# Patient Record
Sex: Female | Born: 1950 | Race: Asian | Hispanic: No | Marital: Married | Smoking: Never smoker
Health system: Southern US, Community
[De-identification: ages and names within clinical notes are randomized; demographics above are authoritative.]

## PROBLEM LIST (undated history)

## (undated) DIAGNOSIS — M069 Rheumatoid arthritis, unspecified: Secondary | ICD-10-CM

## (undated) DIAGNOSIS — K219 Gastro-esophageal reflux disease without esophagitis: Secondary | ICD-10-CM

## (undated) DIAGNOSIS — I10 Essential (primary) hypertension: Secondary | ICD-10-CM

## (undated) DIAGNOSIS — I639 Cerebral infarction, unspecified: Secondary | ICD-10-CM

## (undated) DIAGNOSIS — E785 Hyperlipidemia, unspecified: Secondary | ICD-10-CM

## (undated) DIAGNOSIS — N644 Mastodynia: Secondary | ICD-10-CM

## (undated) DIAGNOSIS — D649 Anemia, unspecified: Secondary | ICD-10-CM

## (undated) DIAGNOSIS — E119 Type 2 diabetes mellitus without complications: Secondary | ICD-10-CM

## (undated) DIAGNOSIS — G039 Meningitis, unspecified: Secondary | ICD-10-CM

## (undated) DIAGNOSIS — N189 Chronic kidney disease, unspecified: Secondary | ICD-10-CM

## (undated) DIAGNOSIS — M5416 Radiculopathy, lumbar region: Secondary | ICD-10-CM

## (undated) HISTORY — DX: Chronic kidney disease, unspecified: N18.9

## (undated) HISTORY — DX: Meningitis, unspecified: G03.9

## (undated) HISTORY — PX: CHOLECYSTECTOMY: SHX55

## (undated) HISTORY — DX: Gastro-esophageal reflux disease without esophagitis: K21.9

## (undated) HISTORY — DX: Radiculopathy, lumbar region: M54.16

## (undated) HISTORY — DX: Mastodynia: N64.4

## (undated) HISTORY — PX: APPENDECTOMY: SHX54

## (undated) HISTORY — DX: Cerebral infarction, unspecified: I63.9

## (undated) HISTORY — PX: MIDDLE EAR SURGERY: SHX713

## (undated) HISTORY — DX: Hyperlipidemia, unspecified: E78.5

## (undated) HISTORY — DX: Anemia, unspecified: D64.9

---

## 2004-06-24 ENCOUNTER — Emergency Department: Payer: Self-pay | Admitting: Emergency Medicine

## 2005-01-21 ENCOUNTER — Inpatient Hospital Stay: Payer: Self-pay | Admitting: Internal Medicine

## 2005-02-13 ENCOUNTER — Emergency Department: Payer: Self-pay | Admitting: Emergency Medicine

## 2005-06-07 ENCOUNTER — Ambulatory Visit: Payer: Self-pay | Admitting: Internal Medicine

## 2005-11-08 ENCOUNTER — Ambulatory Visit: Payer: Self-pay | Admitting: Gastroenterology

## 2006-05-12 ENCOUNTER — Ambulatory Visit: Payer: Self-pay | Admitting: Internal Medicine

## 2006-10-03 ENCOUNTER — Ambulatory Visit: Payer: Self-pay | Admitting: Internal Medicine

## 2006-10-08 ENCOUNTER — Emergency Department: Payer: Self-pay | Admitting: Emergency Medicine

## 2006-10-08 ENCOUNTER — Other Ambulatory Visit: Payer: Self-pay

## 2007-05-15 ENCOUNTER — Other Ambulatory Visit: Payer: Self-pay

## 2007-05-15 ENCOUNTER — Emergency Department: Payer: Self-pay | Admitting: Emergency Medicine

## 2007-05-27 ENCOUNTER — Other Ambulatory Visit: Payer: Self-pay

## 2007-05-27 ENCOUNTER — Emergency Department: Payer: Self-pay | Admitting: Emergency Medicine

## 2007-09-22 ENCOUNTER — Ambulatory Visit: Payer: Self-pay | Admitting: Internal Medicine

## 2007-12-01 ENCOUNTER — Ambulatory Visit: Payer: Self-pay | Admitting: Urology

## 2008-01-23 ENCOUNTER — Ambulatory Visit: Payer: Self-pay | Admitting: Internal Medicine

## 2008-09-23 ENCOUNTER — Ambulatory Visit: Payer: Self-pay | Admitting: *Deleted

## 2008-12-09 ENCOUNTER — Encounter: Payer: Self-pay | Admitting: Rheumatology

## 2008-12-11 ENCOUNTER — Encounter: Payer: Self-pay | Admitting: Rheumatology

## 2009-01-10 ENCOUNTER — Encounter: Payer: Self-pay | Admitting: Rheumatology

## 2009-02-10 ENCOUNTER — Encounter: Payer: Self-pay | Admitting: Rheumatology

## 2009-02-14 ENCOUNTER — Ambulatory Visit: Payer: Self-pay | Admitting: Internal Medicine

## 2009-12-12 ENCOUNTER — Encounter: Payer: Self-pay | Admitting: Rheumatology

## 2010-01-10 ENCOUNTER — Encounter: Payer: Self-pay | Admitting: Rheumatology

## 2010-10-13 ENCOUNTER — Ambulatory Visit: Payer: Self-pay | Admitting: Internal Medicine

## 2011-08-23 ENCOUNTER — Emergency Department: Payer: Self-pay | Admitting: Emergency Medicine

## 2011-08-23 LAB — COMPREHENSIVE METABOLIC PANEL
BUN: 16 mg/dL (ref 7–18)
Bilirubin,Total: 0.4 mg/dL (ref 0.2–1.0)
Co2: 26 mmol/L (ref 21–32)
EGFR (Non-African Amer.): >60
Glucose: 221 mg/dL — ABNORMAL HIGH (ref 65–99)
Osmolality: 282 (ref 275–301)
Potassium: 4.1 mmol/L (ref 3.5–5.1)
SGPT (ALT): 21 U/L
Sodium: 137 mmol/L (ref 136–145)

## 2011-08-23 LAB — PROTIME-INR: INR: 0.8

## 2011-08-23 LAB — CBC
HCT: 36.7 % (ref 35.0–47.0)
HGB: 12.4 g/dL (ref 12.0–16.0)
MCH: 29.3 pg (ref 26.0–34.0)
Platelet: 177 10*3/uL (ref 150–440)
RBC: 4.23 10*6/uL (ref 3.80–5.20)
RDW: 12.9 % (ref 11.5–14.5)
WBC: 6.1 10*3/uL (ref 3.6–11.0)

## 2011-11-02 ENCOUNTER — Ambulatory Visit: Payer: Self-pay | Admitting: Internal Medicine

## 2012-10-10 ENCOUNTER — Emergency Department: Payer: Self-pay | Admitting: Emergency Medicine

## 2012-10-10 LAB — COMPREHENSIVE METABOLIC PANEL
Albumin: 3.8 g/dL (ref 3.4–5.0)
Alkaline Phosphatase: 101 U/L (ref 50–136)
Anion Gap: 5 — ABNORMAL LOW (ref 7–16)
BUN: 17 mg/dL (ref 7–18)
Calcium, Total: 9.5 mg/dL (ref 8.5–10.1)
Chloride: 102 mmol/L (ref 98–107)
Co2: 29 mmol/L (ref 21–32)
Creatinine: 0.93 mg/dL (ref 0.60–1.30)
EGFR (African American): >60
Glucose: 151 mg/dL — ABNORMAL HIGH (ref 65–99)
Osmolality: 276 (ref 275–301)
Potassium: 4 mmol/L (ref 3.5–5.1)
SGOT(AST): 21 U/L (ref 15–37)
Total Protein: 9 g/dL — ABNORMAL HIGH (ref 6.4–8.2)

## 2012-10-10 LAB — CBC
HGB: 13.9 g/dL (ref 12.0–16.0)
MCH: 28.2 pg (ref 26.0–34.0)
MCHC: 33.3 g/dL (ref 32.0–36.0)
RDW: 13.6 % (ref 11.5–14.5)
WBC: 6.6 10*3/uL (ref 3.6–11.0)

## 2012-10-10 LAB — CK TOTAL AND CKMB (NOT AT ARMC): CK-MB: <0.5 ng/mL — ABNORMAL LOW (ref 0.5–3.6)

## 2012-10-10 LAB — TROPONIN I: Troponin-I: <0.02 ng/mL

## 2012-12-27 ENCOUNTER — Ambulatory Visit: Payer: Self-pay | Admitting: Internal Medicine

## 2013-09-25 ENCOUNTER — Ambulatory Visit: Payer: Self-pay | Admitting: Rheumatology

## 2013-10-16 ENCOUNTER — Ambulatory Visit: Payer: Self-pay | Admitting: Rheumatology

## 2013-12-06 DIAGNOSIS — M5416 Radiculopathy, lumbar region: Secondary | ICD-10-CM

## 2013-12-06 DIAGNOSIS — M48062 Spinal stenosis, lumbar region with neurogenic claudication: Secondary | ICD-10-CM | POA: Insufficient documentation

## 2013-12-06 HISTORY — DX: Radiculopathy, lumbar region: M54.16

## 2014-12-17 ENCOUNTER — Encounter: Payer: Self-pay | Admitting: Emergency Medicine

## 2014-12-17 ENCOUNTER — Inpatient Hospital Stay
Admission: EM | Admit: 2014-12-17 | Discharge: 2014-12-19 | DRG: 065 | Disposition: A | Discharge status: Home / Self Care | Payer: Medicaid Other | Attending: Internal Medicine | Admitting: Internal Medicine

## 2014-12-17 ENCOUNTER — Inpatient Hospital Stay
Admit: 2014-12-17 | Discharge: 2014-12-17 | Disposition: A | Discharge status: Home / Self Care | Payer: Medicaid Other | Attending: Internal Medicine | Admitting: Internal Medicine

## 2014-12-17 ENCOUNTER — Emergency Department: Payer: Medicaid Other

## 2014-12-17 DIAGNOSIS — Z7982 Long term (current) use of aspirin: Secondary | ICD-10-CM | POA: Diagnosis not present

## 2014-12-17 DIAGNOSIS — Z79899 Other long term (current) drug therapy: Secondary | ICD-10-CM | POA: Diagnosis not present

## 2014-12-17 DIAGNOSIS — I639 Cerebral infarction, unspecified: Secondary | ICD-10-CM | POA: Diagnosis present

## 2014-12-17 DIAGNOSIS — Z9889 Other specified postprocedural states: Secondary | ICD-10-CM | POA: Diagnosis not present

## 2014-12-17 DIAGNOSIS — M069 Rheumatoid arthritis, unspecified: Secondary | ICD-10-CM | POA: Diagnosis present

## 2014-12-17 DIAGNOSIS — I69354 Hemiplegia and hemiparesis following cerebral infarction affecting left non-dominant side: Secondary | ICD-10-CM | POA: Diagnosis not present

## 2014-12-17 DIAGNOSIS — I10 Essential (primary) hypertension: Secondary | ICD-10-CM | POA: Diagnosis present

## 2014-12-17 DIAGNOSIS — E119 Type 2 diabetes mellitus without complications: Secondary | ICD-10-CM | POA: Diagnosis present

## 2014-12-17 DIAGNOSIS — Z789 Other specified health status: Secondary | ICD-10-CM

## 2014-12-17 DIAGNOSIS — R202 Paresthesia of skin: Secondary | ICD-10-CM

## 2014-12-17 HISTORY — DX: Type 2 diabetes mellitus without complications: E11.9

## 2014-12-17 HISTORY — DX: Cerebral infarction, unspecified: I63.9

## 2014-12-17 HISTORY — DX: Rheumatoid arthritis, unspecified: M06.9

## 2014-12-17 HISTORY — DX: Essential (primary) hypertension: I10

## 2014-12-17 LAB — PROTIME-INR
INR: 0.86
Prothrombin Time: 11.9 seconds (ref 11.4–15.0)

## 2014-12-17 LAB — DIFFERENTIAL
BASOS ABS: 0 10*3/uL (ref 0–0.1)
Basophils Relative: 1 %
EOS ABS: 0.1 10*3/uL (ref 0–0.7)
Eosinophils Relative: 2 %
LYMPHS ABS: 1.8 10*3/uL (ref 1.0–3.6)
LYMPHS PCT: 22 %
MONOS PCT: 6 %
Monocytes Absolute: 0.5 10*3/uL (ref 0.2–0.9)
NEUTROS ABS: 5.6 10*3/uL (ref 1.4–6.5)
NEUTROS PCT: 69 %

## 2014-12-17 LAB — COMPREHENSIVE METABOLIC PANEL
ALBUMIN: 4 g/dL (ref 3.5–5.0)
ALK PHOS: 104 U/L (ref 38–126)
ALT: 30 U/L (ref 14–54)
AST: 29 U/L (ref 15–41)
Anion gap: 12 (ref 5–15)
BILIRUBIN TOTAL: 0.8 mg/dL (ref 0.3–1.2)
BUN: 17 mg/dL (ref 6–20)
CO2: 22 mmol/L (ref 22–32)
CREATININE: 1 mg/dL (ref 0.44–1.00)
Calcium: 9.5 mg/dL (ref 8.9–10.3)
Chloride: 99 mmol/L — ABNORMAL LOW (ref 101–111)
GFR calc Af Amer: >60 mL/min (ref >60)
GFR, EST NON AFRICAN AMERICAN: 58 mL/min — AB (ref >60)
GLUCOSE: 189 mg/dL — AB (ref 65–99)
POTASSIUM: 3.8 mmol/L (ref 3.5–5.1)
Sodium: 133 mmol/L — ABNORMAL LOW (ref 135–145)
TOTAL PROTEIN: 8.9 g/dL — AB (ref 6.5–8.1)

## 2014-12-17 LAB — CBC
HEMATOCRIT: 40.8 % (ref 35.0–47.0)
HEMOGLOBIN: 13.5 g/dL (ref 12.0–16.0)
MCH: 28.2 pg (ref 26.0–34.0)
MCHC: 33.2 g/dL (ref 32.0–36.0)
MCV: 85.2 fL (ref 80.0–100.0)
Platelets: 274 10*3/uL (ref 150–440)
RBC: 4.79 MIL/uL (ref 3.80–5.20)
RDW: 13.5 % (ref 11.5–14.5)
WBC: 8 10*3/uL (ref 3.6–11.0)

## 2014-12-17 LAB — TROPONIN I

## 2014-12-17 LAB — APTT: APTT: 30 s (ref 24–36)

## 2014-12-17 MED ORDER — ACETAMINOPHEN 325 MG PO TABS: 650.0000 mg | ORAL_TABLET | Freq: Four times a day (QID) | ORAL | Status: DC | PRN

## 2014-12-17 MED ORDER — SODIUM CHLORIDE 0.9 % IJ SOLN
3.0000 mL | Freq: Two times a day (BID) | INTRAMUSCULAR | Status: DC
  Administered 2014-12-17: 22:00:00 3 mL via INTRAVENOUS

## 2014-12-17 MED ORDER — METOPROLOL TARTRATE 50 MG PO TABS
50.0000 mg | ORAL_TABLET | Freq: Two times a day (BID) | ORAL | Status: DC
  Filled 2014-12-17: qty 1

## 2014-12-17 MED ORDER — ASPIRIN EC 325 MG PO TBEC
325.0000 mg | DELAYED_RELEASE_TABLET | Freq: Every day | ORAL | Status: DC
  Administered 2014-12-19: 325 mg via ORAL
  Filled 2014-12-17: qty 1

## 2014-12-17 MED ORDER — METOPROLOL TARTRATE 1 MG/ML IV SOLN
5.0000 mg | INTRAVENOUS | Status: DC | PRN
  Administered 2014-12-17: 19:00:00 5 mg via INTRAVENOUS
  Filled 2014-12-17 (×2): qty 5

## 2014-12-17 MED ORDER — SODIUM CHLORIDE 0.9 % IJ SOLN
3.0000 mL | Freq: Two times a day (BID) | INTRAMUSCULAR | Status: DC
  Administered 2014-12-17: 21:00:00 3 mL via INTRAVENOUS

## 2014-12-17 MED ORDER — SODIUM CHLORIDE 0.9 % IV SOLN: 250.0000 mL | INTRAVENOUS | Status: DC | PRN

## 2014-12-17 MED ORDER — METOPROLOL TARTRATE 1 MG/ML IV SOLN
5.0000 mg | INTRAVENOUS | Status: DC | PRN
  Filled 2014-12-17: qty 5

## 2014-12-17 MED ORDER — ACETAMINOPHEN 650 MG RE SUPP: 650.0000 mg | Freq: Four times a day (QID) | RECTAL | Status: DC | PRN

## 2014-12-17 MED ORDER — SIMVASTATIN 10 MG PO TABS
10.0000 mg | ORAL_TABLET | Freq: Every day | ORAL | Status: DC
  Filled 2014-12-17 (×2): qty 1

## 2014-12-17 MED ORDER — ENOXAPARIN SODIUM 40 MG/0.4ML ~~LOC~~ SOLN
40.0000 mg | SUBCUTANEOUS | Status: DC
  Administered 2014-12-17 – 2014-12-18 (×2): 40 mg via SUBCUTANEOUS
  Filled 2014-12-17 (×2): qty 0.4

## 2014-12-17 MED ORDER — SODIUM CHLORIDE 0.9 % IJ SOLN
3.0000 mL | INTRAMUSCULAR | Status: DC | PRN
Start: 2014-12-17 — End: 2014-12-18

## 2014-12-17 NOTE — H&P (Signed)
[facility] [place] NAME: Kelli Gutierrez    MR#:  [id number]  DATE OF BIRTH:  [birth year]  DATE OF ADMISSION:  12/17/2014  PRIMARY CARE PHYSICIAN: No primary care provider on file.   REQUESTING/REFERRING PHYSICIAN: Dr. Thomasene Lot  CHIEF COMPLAINT:  Right hand tingling and numbness  HISTORY OF PRESENT ILLNESS:  Kelli Gutierrez  is a 64 y.o. 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 Denies any headache or blurry vision. Denies any dysarthria. Patient reports that her symptoms were started at 6 PM yesterday and the right upper extremity weakness is getting worse with pronator drift. CT head is negative. Hospitalist team is called to admit the patient. The patient's blood pressure is very high with systolic being at [id number]  PAST MEDICAL HISTORY:   Past Medical History  Diagnosis Date  . Diabetes mellitus without complication   . Hypertension   . Rheumatoid arthritis   . Stroke     PAST SURGICAL HISTOIRY:   Past Surgical History  Procedure Laterality Date  . Middle ear surgery      SOCIAL HISTORY:   Social History  Substance Use Topics  . Smoking status: Never Smoker   . Smokeless tobacco: Not on file  . Alcohol Use: No    FAMILY HISTORY:  History reviewed. No pertinent family history.  DRUG ALLERGIES:  No Known Allergies  REVIEW OF SYSTEMS:  CONSTITUTIONAL: No fever, fatigue or weakness.  EYES: No blurred or double vision.  EARS, NOSE, AND THROAT: No tinnitus or ear pain.  RESPIRATORY: No cough, shortness of breath, wheezing or hemoptysis.  CARDIOVASCULAR: No chest pain, orthopnea, edema.  GASTROINTESTINAL: No nausea, vomiting,  diarrhea or abdominal pain.  GENITOURINARY: No dysuria, hematuria.  ENDOCRINE: No polyuria, nocturia,  HEMATOLOGY: No anemia, easy bruising or bleeding SKIN: No rash or lesion. MUSCULOSKELETAL: No joint pain or arthritis.  Chronic left residual weakness. Currently reporting right upper extremity tingling and numbness NEUROLOGIC: No tingling, numbness, weakness.  PSYCHIATRY: No anxiety or depression.   MEDICATIONS AT HOME:   Prior to Admission medications   Medication Sig Start Date End Date Taking? Authorizing Provider  aspirin EC 81 MG tablet Take 81 mg by mouth every evening.    Yes Historical Provider, MD  glipiZIDE-metformin (METAGLIP) 5-500 MG per tablet Take 1 tablet by mouth 2 (two) times daily.   Yes Historical Provider, MD  metoprolol tartrate (LOPRESSOR) 25 MG tablet Take 25 mg by mouth daily.   Yes Historical Provider, MD  Tofacitinib Citrate (XELJANZ) 5 MG TABS Take 5 mg by mouth 2 (two) times daily.   Yes Historical Provider, MD      VITAL SIGNS:  Blood pressure 242/74, pulse 79, temperature 98.2 F (36.8 C), temperature source Oral, resp. rate 20, SpO2 98 %.  PHYSICAL EXAMINATION:  GENERAL:  64 y.o.-year-old patient lying in the bed with no acute distress.  EYES: Pupils equal, round, reactive to light and accommodation. No scleral icterus. Extraocular muscles intact.  HEENT: Head atraumatic, normocephalic. Oropharynx and nasopharynx clear.  NECK:  Supple, no jugular venous distention. No thyroid enlargement, no tenderness.  LUNGS: Normal breath sounds bilaterally, no wheezing, rales,rhonchi or crepitation. No use of  accessory muscles of respiration.  CARDIOVASCULAR: S1, S2 normal. No murmurs, rubs, or gallops.  ABDOMEN: Soft, nontender, nondistended. Bowel sounds present. No organomegaly or mass.  EXTREMITIES: No pedal edema, cyanosis, or clubbing.  NEUROLOGIC: Cranial nerves II through XII are intact. Muscle strength 4/5 in left upper extremity and left lower extremity  which is chronic .right upper extremity motor is 4 out of 5 and decreased touch sensation .right lower extremity arteries 5 out of 5 and sensory is intact .reflexes are 2+. And left mouth is deviated to the right nasolabial fold is obliterated on the left . Speech is slow but patient has reported that she is at her baseline Gait not checked.  PSYCHIATRIC: The patient is alert and oriented x 3.  SKIN: No obvious rash, lesion, or ulcer.   LABORATORY PANEL:   CBC  Recent Labs Lab 12/17/14 1300  WBC 8.0  HGB 13.5  HCT 40.8  PLT 274   ------------------------------------------------------------------------------------------------------------------  Chemistries   Recent Labs Lab 12/17/14 1300  NA 133*  K 3.8  CL 99*  CO2 22  GLUCOSE 189*  BUN 17  CREATININE 1.00  CALCIUM 9.5  AST 29  ALT 30  ALKPHOS 104  BILITOT 0.8   ------------------------------------------------------------------------------------------------------------------  Cardiac Enzymes  Recent Labs Lab 12/17/14 1300  TROPONINI <0.03   ------------------------------------------------------------------------------------------------------------------  RADIOLOGY:  Ct Head Wo Contrast  12/17/2014   CLINICAL DATA:  Right side numbness since 12/16/2014. Initial encounter.  EXAM: CT HEAD WITHOUT CONTRAST  TECHNIQUE: Contiguous axial images were obtained from the base of the skull through the vertex without intravenous contrast.  COMPARISON:  Head CT scan 08/23/2011  FINDINGS: Chronic microvascular ischemic change and a remote right basal ganglia lacunar infarction are again seen. There may be a remote left basal ganglia lacunar infarction versus a dilated perivascular space, unchanged. No evidence of acute abnormality including hemorrhage, infarct, mass lesion, mass effect, midline shift or abnormal extra-axial fluid collection is identified. There is no hydrocephalus or pneumocephalus. The calvarium is intact. Imaged  paranasal sinuses and mastoid air cells are clear.  IMPRESSION: No acute abnormality.  Stable compared to prior exam.   Electronically Signed   By: Inge Rise M.D.   On: 12/17/2014 13:38    EKG:   Orders placed or performed during the hospital encounter of 12/17/14  . ED EKG  . ED EKG  . EKG 12-Lead  . EKG 12-Lead    IMPRESSION AND PLAN:   64 year old female with past medical history of acute CVA in year 1999, chronic left-sided residual weakness is presenting to the ED with a chief complaint of right upper extremity tingling and numbness started from 6 PM yesterday.  1. 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 Leotis Shames Patient's aspirin dose is increased to 325 mg Check fasting lipid panel and hemoglobin A1c Patient needs tight control of her risk factors including hypertension and diabetes mellitus PT consult We will get carotid Dopplers, echocardiogram and MRI of the brain Will get bedside swallow evaluation and speech therapy eval if needed  2. Malignant hypertension  We will allow permissive hypertension in view of problem #1 Patient will be receiving Lopressor 5 mg IV if systolic blood pressure is greater than 170 Increase metoprolol dose to 50 mg by mouth twice a day and titrate as needed basis as patient's systolic blood pressure is  at 240  3. Diabetes mellitus Hold oral medications Check hemoglobin A1c Will place the patient on sliding scale insulin as currently she is on by mouth  4. Old history of stroke with chronic residual left-sided weakness Patient is not a candidate for TPA as she had intracranial bleed in the past  5. Chronic history of rheumatoid arthritis Will resume her home medication if she passes bedside swallow evaluation Pain medications as needed basis  6. Will provide GI  prophylaxis and DVT prophylaxis       All the records are reviewed and case discussed with ED provider. Management plans discussed with the patient, family and they are in agreement.  CODE STATUS: Full code, daughter is her medical power of attorney  TOTAL TIME TAKING CARE OF THIS PATIENT: 45  minutes.  More than 50% of the time was spent on coordination of care   Nicholes Mango M.D on 12/17/2014 at 5:28 PM  Between 7am to 6pm - Pager - [phone]  After 6pm go to [email or web address] - password [email or web address] [facility]  [facility] [facility]  Office  [phone]  CC: Primary care physician; No primary care provider on file.

## 2014-12-17 NOTE — ED Notes (Signed)
Pt states yesterday, her right arm and leg began tingling, however intermittent, it began again this morning, however, worsening right now. Pt has facial droop noted, however, she states this is from her previous stroke in 56. Speech is clear.

## 2014-12-17 NOTE — Plan of Care (Signed)
Problem: Discharge/Transitional Outcomes Goal: Educational Plan Complete Outcome: Progressing Handout given to pt

## 2014-12-17 NOTE — ED Notes (Signed)
Pt reports right sided weakness and "tingling" beginning yesterday. Pt reports right arm and hand feeling weak. Pt reports having a stroke in the past (1999) that left pt with left sided weakness and facial droop. Pt has elevated BP but denies headache, dizziness, or changes in vision other than normal diabetic related visual changes. Pt is in no acute distress at this time.

## 2014-12-17 NOTE — ED Provider Notes (Signed)
[facility] Emergency Department Provider Note  ____________________________________________  Time seen: [id number]  I have reviewed the triage vital signs and the nursing notes.   HISTORY  Chief Complaint Stroke Symptoms     HPI Kelli Gutierrez is a 64 y.o. female with a history of prior CVA, which includes the use of TPA with subsequent intracranial hemorrhage, presents with a complaint of increased numbness tingling in the right extremities. She reports that this began yesterday. She denies any change in motor function. She does have noted facial asymmetry, which she reports is not new.  She denies any chest pain, shortness of breath, nausea, or vomiting.   Past Medical History  Diagnosis Date  . Diabetes mellitus without complication   . Hypertension   . Rheumatoid arthritis   . Stroke     There are no active problems to display for this patient.   Past Surgical History  Procedure Laterality Date  . Middle ear surgery      Current Outpatient Rx  Name  Route  Sig  Dispense  Refill  . aspirin EC 81 MG tablet   Oral   Take 81 mg by mouth every evening.          Marland Kitchen glipiZIDE-metformin (METAGLIP) 5-500 MG per tablet   Oral   Take 1 tablet by mouth 2 (two) times daily.         . metoprolol tartrate (LOPRESSOR) 25 MG tablet   Oral   Take 25 mg by mouth daily.         . Tofacitinib Citrate (XELJANZ) 5 MG TABS   Oral   Take 5 mg by mouth 2 (two) times daily.           Allergies Review of patient's allergies indicates no known allergies.  No family history on file.  Social History Social History  Substance Use Topics  . Smoking status: Never Smoker   . Smokeless tobacco: None  . Alcohol Use: No    Review of Systems  Constitutional: Negative for fever. ENT: Negative for sore throat. Cardiovascular: Negative for chest pain. Respiratory: Negative for shortness of breath. Gastrointestinal: Negative for abdominal pain, vomiting  and diarrhea. Genitourinary: Negative for dysuria. Musculoskeletal: No myalgias or injuries. Skin: Negative for rash. Neurological: Positive for numbness tingling paresthesia in the right extremities. No other acute changes reported   10-point ROS otherwise negative.  ____________________________________________   PHYSICAL EXAM:  VITAL SIGNS: ED Triage Vitals  Enc Vitals Group     BP 12/17/14 1252 232/92 mmHg     Pulse Rate 12/17/14 1252 93     Resp 12/17/14 1252 18     Temp 12/17/14 1252 97.6 F (36.4 C)     Temp Source 12/17/14 1252 Oral     SpO2 12/17/14 1252 95 %     Weight --      Height --      Head Cir --      Peak Flow --      Pain Score --      Pain Loc --      Pain Edu? --      Excl. in [place]? --     Constitutional: Alert and oriented. Pleasant and in no distress. ENT   Head: Normocephalic and atraumatic.   Nose: No congestion/rhinnorhea.   Mouth/Throat: Mucous membranes are moist. Cardiovascular: Normal rate, regular rhythm, no murmur noted Respiratory:  Normal respiratory effort, no tachypnea.    Breath sounds are clear and equal bilaterally.  Gastrointestinal: Soft and nontender. No distention.  Back: No muscle spasm, no tenderness, no CVA tenderness. Musculoskeletal: No deformity noted. Nontender with normal range of motion in all extremities.  No noted edema. Neurologic:  Normal speech and language. Decreased sensation in the right lower extremity. Pronator drift shows mild mild drift of the right arm. Patient has noted facial asymmetry, which she reports is not new. The remainder of the exam is normal.  Skin:  Skin is warm, dry. No rash noted. Psychiatric: Mood and affect are normal. Speech and behavior are normal.  ____________________________________________    LABS (pertinent positives/negatives)  Labs Reviewed  COMPREHENSIVE METABOLIC PANEL - Abnormal; Notable for the following:    Sodium 133 (*)    Chloride 99 (*)    Glucose, Bld 189  (*)    Total Protein 8.9 (*)    GFR calc non Af Amer 58 (*)    All other components within normal limits  PROTIME-INR  APTT  CBC  DIFFERENTIAL  TROPONIN I     ____________________________________________   EKG  ED ECG REPORT I, Kawanda Drumheller W, the attending physician, personally viewed and interpreted this ECG.   Date: 12/17/2014  EKG Time: 1341  Rate: 80  Rhythm: Normal sinus rhythm  Axis: Normal  Intervals: Normal  ST&T Change: None noted   ____________________________________________    RADIOLOGY  CT scan head: IMPRESSION: No acute abnormality. Stable compared to prior exam.  ___________________________________________  CRITICAL CARE Performed by: Ahmed Prima   Total critical care time: 30 minutes due to the acute nature of this patient's neurologic condition and discussions with both neurology and internal medicine.  Critical care time was exclusive of separately billable procedures and treating other patients.  Critical care was necessary to treat or prevent imminent or life-threatening deterioration.  Critical care was time spent personally by me on the following activities: development of treatment plan with patient and/or surrogate as well as nursing, discussions with consultants, evaluation of patient's response to treatment, examination of patient, obtaining history from patient or surrogate, ordering and performing treatments and interventions, ordering and review of laboratory studies, ordering and review of radiographic studies, pulse oximetry and re-evaluation of patient's condition.  ____________________________________________   INITIAL IMPRESSION / ASSESSMENT AND PLAN / ED COURSE  Pertinent labs & imaging results that were available during my care of the patient were reviewed by me and considered in my medical decision making (see chart for details).  64 year old female with prior CVA, now presents with new onset paresthesia and  tingling in the right arm and right leg. It is difficult to determine what symptoms are old versus new for this patient. I am concerned that she has a very mild drift in her right arm upon assessing pronator drift. Head CT is negative for a hemorrhage. Given the fact that the symptoms started yesterday as well as the fact that she had had a bleed on a prior use of TPA, she would not be a candidate for TPA anyway.  I will call neurology and seek their further input on this patient's care.   ----------------------------------------- 3:10 PM on 12/17/2014 -----------------------------------------  After discussing the case with Dr. Irish Elders, I have spoken with Dr. Margaretmary Eddy to have the patient evaluated for initial hospital.  ____________________________________________   FINAL CLINICAL IMPRESSION(S) / ED DIAGNOSES  Final diagnoses:  Paresthesia of right arm and leg  Cerebral infarction due to unspecified mechanism      Ahmed Prima, MD 12/17/14 1511

## 2014-12-17 NOTE — ED Notes (Signed)
MD at bedside with pt

## 2014-12-17 NOTE — ED Notes (Signed)
Pt was given tpa on prior cva event, had a brain bleed from administration.

## 2014-12-17 NOTE — ED Notes (Signed)
Patient transported to CT 

## 2014-12-17 NOTE — ED Notes (Signed)
When checking on pt pt verbalized increased heaviness in right arm and tingling in right hand. When tested for drift, RN noted drift has increased in right arm but not in right leg. MD notified. Pt in no acute distress at this time.

## 2014-12-17 NOTE — ED Notes (Signed)
Resumed care from shannon rn.  Pt alert.   Pt has numbness in right arm.  Family with pt.  nsr on monitor.  Skin warm and dry.  Iv in place.

## 2014-12-17 NOTE — Plan of Care (Signed)
Problem: Consults Goal: Ischemic Stroke Patient Education See Patient Education Module for education specifics. Outcome: Progressing NIH of 6, pt has had a stroke in the past, most recent was 1999. Right sided weakness noted, pt complains of tingling to right side Swallow study to be done by speech, pt failed the nursing stroke swallowing screen

## 2014-12-17 NOTE — Progress Notes (Signed)
*  PRELIMINARY RESULTS* Echocardiogram 2D Echocardiogram has been performed.  [place] 12/17/2014, 6:21 PM

## 2014-12-18 ENCOUNTER — Inpatient Hospital Stay: Payer: Medicaid Other

## 2014-12-18 DIAGNOSIS — I639 Cerebral infarction, unspecified: Principal | ICD-10-CM

## 2014-12-18 LAB — LIPID PANEL
CHOL/HDL RATIO: 6.1 ratio
Cholesterol: 267 mg/dL — ABNORMAL HIGH (ref 0–200)
HDL: 44 mg/dL (ref >40)
LDL CALC: 168 mg/dL — AB (ref 0–99)
Triglycerides: 273 mg/dL — ABNORMAL HIGH (ref <150)
VLDL: 55 mg/dL — ABNORMAL HIGH (ref 0–40)

## 2014-12-18 LAB — CBC
HEMATOCRIT: 39.3 % (ref 35.0–47.0)
HEMOGLOBIN: 13.1 g/dL (ref 12.0–16.0)
MCH: 28.9 pg (ref 26.0–34.0)
MCHC: 33.4 g/dL (ref 32.0–36.0)
MCV: 86.4 fL (ref 80.0–100.0)
Platelets: 251 10*3/uL (ref 150–440)
RBC: 4.54 MIL/uL (ref 3.80–5.20)
RDW: 14 % (ref 11.5–14.5)
WBC: 6.5 10*3/uL (ref 3.6–11.0)

## 2014-12-18 LAB — COMPREHENSIVE METABOLIC PANEL
ALK PHOS: 90 U/L (ref 38–126)
ALT: 25 U/L (ref 14–54)
AST: 18 U/L (ref 15–41)
Albumin: 3.6 g/dL (ref 3.5–5.0)
Anion gap: 10 (ref 5–15)
BUN: 17 mg/dL (ref 6–20)
CALCIUM: 9.3 mg/dL (ref 8.9–10.3)
CO2: 26 mmol/L (ref 22–32)
CREATININE: 0.92 mg/dL (ref 0.44–1.00)
Chloride: 102 mmol/L (ref 101–111)
Glucose, Bld: 190 mg/dL — ABNORMAL HIGH (ref 65–99)
Potassium: 3.7 mmol/L (ref 3.5–5.1)
Sodium: 138 mmol/L (ref 135–145)
Total Bilirubin: 0.8 mg/dL (ref 0.3–1.2)
Total Protein: 7.9 g/dL (ref 6.5–8.1)

## 2014-12-18 LAB — PROTIME-INR
INR: 0.93
PROTHROMBIN TIME: 12.7 s (ref 11.4–15.0)

## 2014-12-18 LAB — GLUCOSE, CAPILLARY
GLUCOSE-CAPILLARY: 203 mg/dL — AB (ref 65–99)
GLUCOSE-CAPILLARY: 193 mg/dL — AB (ref 65–99)

## 2014-12-18 LAB — HEMOGLOBIN A1C: Hgb A1c MFr Bld: 8.4 % — ABNORMAL HIGH (ref 4.0–6.0)

## 2014-12-18 MED ORDER — ATORVASTATIN CALCIUM 20 MG PO TABS
80.0000 mg | ORAL_TABLET | Freq: Every day | ORAL | Status: DC
  Administered 2014-12-18: 20:00:00 80 mg via ORAL
  Filled 2014-12-18 (×2): qty 4

## 2014-12-18 MED ORDER — GADOBENATE DIMEGLUMINE 529 MG/ML IV SOLN
15.0000 mL | Freq: Once | INTRAVENOUS | Status: AC | PRN
  Administered 2014-12-18: 10:00:00 14 mL via INTRAVENOUS

## 2014-12-18 MED ORDER — METFORMIN HCL 500 MG PO TABS: 500.0000 mg | ORAL_TABLET | Freq: Two times a day (BID) | ORAL | Status: DC

## 2014-12-18 MED ORDER — GLIPIZIDE 5 MG PO TABS
5.0000 mg | ORAL_TABLET | Freq: Two times a day (BID) | ORAL | Status: DC
  Administered 2014-12-18 – 2014-12-19 (×2): 5 mg via ORAL
  Filled 2014-12-18 (×2): qty 1

## 2014-12-18 MED ORDER — IOHEXOL 300 MG/ML  SOLN
75.0000 mL | Freq: Once | INTRAMUSCULAR | Status: AC | PRN
  Administered 2014-12-18: 75 mL via INTRAVENOUS

## 2014-12-18 MED ORDER — METOPROLOL TARTRATE 50 MG PO TABS
50.0000 mg | ORAL_TABLET | Freq: Two times a day (BID) | ORAL | Status: DC
  Administered 2014-12-18 – 2014-12-19 (×2): 50 mg via ORAL
  Filled 2014-12-18 (×2): qty 1

## 2014-12-18 MED ORDER — INSULIN ASPART 100 UNIT/ML ~~LOC~~ SOLN: 0.0000 [IU] | Freq: Every day | SUBCUTANEOUS | Status: DC

## 2014-12-18 MED ORDER — INSULIN ASPART 100 UNIT/ML ~~LOC~~ SOLN
0.0000 [IU] | Freq: Three times a day (TID) | SUBCUTANEOUS | Status: DC
  Administered 2014-12-19: 2 [IU] via SUBCUTANEOUS
  Administered 2014-12-19: 3 [IU] via SUBCUTANEOUS
  Filled 2014-12-18: qty 3
  Filled 2014-12-18: qty 2

## 2014-12-18 NOTE — Progress Notes (Signed)
NIH scale has increased from 6 to a 10. Aphasia and right arm weakness has worsened. Pt complaining of severe tingling to face. Dr. Irish Elders, notified ordered a stat CT to rule out Hemorraghic stroke  and MRI notified that pt will need MRI stat after CT.

## 2014-12-18 NOTE — Progress Notes (Addendum)
Inpatient Diabetes Program Recommendations  AACE/ADA: New Consensus Statement on Inpatient Glycemic Control (2013)  Target Ranges:  Prepandial:   less than 140 mg/dL      Peak postprandial:   less than 180 mg/dL (1-2 hours)      Critically ill patients:  140 - 180 mg/dL   Results for SHEREDA, BUTLAND (MRN [id number]) as of 12/18/2014 08:54  Ref. Range 12/17/2014 13:00 12/18/2014 04:37  Glucose Latest Ref Range: 65-99 mg/dL 189 (H) 190 (H)    Diabetes history: DM2 Outpatient Diabetes medications: Metaglip 5-500 mg BID Current orders for Inpatient glycemic control: None  Inpatient Diabetes Program Recommendations Correction (SSI): Please consider ordering Novolog sensitive correction ACHS using Glycemic Control Order Set. HgbA1C: A1C is in process.   12/18/14@17 :45-Talked with Dr. Anselm Jungling regarding inpatient glycemic control. Order given to resume outpatient DM medications and to order CBGs with Novolog sensitive correction.  Thanks, Barnie Alderman, RN, MSN, CCRN, CDE Diabetes Coordinator Inpatient Diabetes Program [phone] (Team Pager from [place] to [place]) [phone] (AP office) [phone] [facility] office) [phone] [facility] office)

## 2014-12-18 NOTE — Progress Notes (Signed)
PT Cancellation Note  Patient Details Name: Kelli Gutierrez MRN: [id number] DOB: 16-Nov-1950   Cancelled Treatment:    Reason Eval/Treat Not Completed: Patient at procedure or test/unavailable;Other (comment) (has multiple tests to be conducted).  Will try later if time allows.   Ramond Dial 12/18/2014, 10:16 AM   Mee Hives, PT MS Acute Rehab Dept. Number: [facility] [id number] and [place] [phone]

## 2014-12-18 NOTE — Plan of Care (Signed)
Problem: Spiritual Needs Goal: Ability to function at adequate level Outcome: Progressing Plan of care Progress of care: Patient alert and oriented, admitted with stroke, has right side weakness, slurred speech. Patient complains of numbness to right side of face , b/p elevated Mobility: Patient ambulates to bathroom with one assist.  Diet: Npo except for icechips Patient awaiting MRI test.  Patient with telemetry intact sr 71

## 2014-12-18 NOTE — Evaluation (Signed)
Clinical/Bedside Swallow Evaluation Patient Details  Name: ANTIANA VAUSE MRN: [id number] Date of Birth: [birth year]  Today's Date: 12/18/2014 Time: SLP Start Time (ACUTE ONLY): 7 SLP Stop Time (ACUTE ONLY): 1230 SLP Time Calculation (min) (ACUTE ONLY): 60 min  Past Medical History:  Past Medical History  Diagnosis Date  . Diabetes mellitus without complication   . Hypertension   . Rheumatoid arthritis   . Stroke    Past Surgical History:  Past Surgical History  Procedure Laterality Date  . Middle ear surgery     HPI:  Pt is a 64 y.o. 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 Pt is presenting to the ED with a chief complaint of right upper extremity tingling, numbness and weakness. Denies any headache or blurry vision. Denies any new dysarthria. Patient reports that her symptoms were started at 6 PM yesterday and the right upper extremity weakness is getting worse with pronator drift. Pt has been eating a regular diet and denied any difficulty swallowing w/ foods/liquids. Speech fairly clear and intelligible; pt denied any new decline in articulation. Pt is currently NPO.    Assessment / Plan / Recommendation Clinical Impression  Pt appeared to present w/ adequate oropharyngeal phase swallow function w/ no overt s/s of aspiration noted w/ trials assessed. Pt has baseline Left labial weakness but new R facial/labial numbness and tingling. Oral phase appeared adequate for bolus management and transfer for swallowing. No overt s/s of aspiration noted. Pt fed self but required moderate assistance d/t old Left UE weakness and new R UE weakness; OT consult requested. Pt's speech and language abilities appeared wfl during conversation. Rec. a mech soft diet w/ thin liquids w/ aspiration precautions; meds w/ liquids as tolerates. ST will f/u. Pt  agreed.      Aspiration Risk   (reduced)    Diet Recommendation Dysphagia 3 (Mech soft);Thin   Medication Administration: Whole meds with liquid Compensations: Slow rate;Small sips/bites    Other  Recommendations Recommended Consults:  (OT and PT) Oral Care Recommendations: Oral care BID;Staff/trained caregiver to provide oral care   Follow Up Recommendations       Frequency and Duration min 3x week  1 week   Pertinent Vitals/Pain denied    SLP Swallow Goals  see care plan   Swallow Study Prior Functional Status   lived at home caring for Son. Fairly independent w/ ADLs but w/ LUE weakness d/t old CVA. Ate a regular diet.     General Date of Onset: 12/17/14 Other Pertinent Information: Pt is a 64 y.o. 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 Pt is presenting to the ED with a chief complaint of right upper extremity tingling, numbness and weakness. Denies any headache or blurry vision. Denies any new dysarthria. Patient reports that her symptoms were started at 6 PM yesterday and the right upper extremity weakness is getting worse with pronator drift. Pt has been eating a regular diet and denied any difficulty swallowing w/ foods/liquids. Speech fairly clear and intelligible; pt denied any new decline in articulation. Pt is currently NPO.  Type of Study: Bedside swallow evaluation Previous Swallow Assessment: none noted Diet Prior to this Study:  Regular;Thin liquids (at home) Temperature Spikes Noted: No (wbc wnl) Respiratory Status: Room air History of Recent Intubation: No Behavior/Cognition: Alert;Cooperative;Pleasant mood Oral Cavity - Dentition: Adequate natural dentition/normal for age Self-Feeding Abilities: Needs assist;Needs set up (moderate+) Patient Positioning: Upright in bed Baseline Vocal Quality:  Normal Volitional Cough: Strong Volitional Swallow: Able to elicit    Oral/Motor/Sensory Function Overall Oral Motor/Sensory Function: Impaired Labial ROM: Reduced left (baseline; numbness on R) Labial Symmetry: Abnormal symmetry left (baseline) Labial Strength: Reduced (Left; baseline) Labial Sensation: Reduced Lingual ROM: Within Functional Limits Lingual Symmetry: Within Functional Limits Lingual Strength: Within Functional Limits Facial Sensation: Reduced (on Right) Velum: Within Functional Limits Mandible: Within Functional Limits   Ice Chips Ice chips: Within functional limits Presentation: Spoon (fed; 3 trials)   Thin Liquid Thin Liquid: Within functional limits Presentation: Cup;Self Fed;Straw (~4 ozs)    Nectar Thick Nectar Thick Liquid: Not tested   Honey Thick Honey Thick Liquid: Not tested   Puree Puree: Within functional limits Presentation: Spoon;Self Fed (assisted; 4 ozs) Other Comments: slight amount of bolus residue in Left corner of mouth intermittently - baseline for pt per her report   Solid   GO    Solid: Within functional limits Presentation: Spoon (fed; 3 trials)      Orinda Kenner, MS, CCC-SLP  Ingris Pasquarella 12/18/2014,2:45 PM

## 2014-12-18 NOTE — Consult Note (Signed)
CC: L sided weakness   HPI: Kelli Gutierrez is an 64 y.o. 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 On admission SBP was 240.    Past Medical History  Diagnosis Date  . Diabetes mellitus without complication   . Hypertension   . Rheumatoid arthritis   . Stroke     Past Surgical History  Procedure Laterality Date  . Middle ear surgery      History reviewed. No pertinent family history.  Social History:  reports that she has never smoked. She does not have any smokeless tobacco history on file. She reports that she does not drink alcohol. Her drug history is not on file.  No Known Allergies  Medications: I have reviewed the patient's current medications.  ROS: History obtained from the patient  General ROS: negative for - chills, fatigue, fever, night sweats, weight gain or weight loss Psychological ROS: negative for - behavioral disorder, hallucinations, memory difficulties, mood swings or suicidal ideation Ophthalmic ROS: negative for - blurry vision, double vision, eye pain or loss of vision ENT ROS: negative for - epistaxis, nasal discharge, oral lesions, sore throat, tinnitus or vertigo Allergy and Immunology ROS: negative for - hives or itchy/watery eyes Hematological and Lymphatic ROS: negative for - bleeding problems, bruising or swollen lymph nodes Endocrine ROS: negative for - galactorrhea, hair pattern changes, polydipsia/polyuria or temperature intolerance Respiratory ROS: negative for - cough, hemoptysis, shortness of breath or wheezing Cardiovascular ROS: negative for - chest pain, dyspnea on exertion, edema or irregular heartbeat Gastrointestinal ROS: negative for  - abdominal pain, diarrhea, hematemesis, nausea/vomiting or stool incontinence Genito-Urinary ROS: negative for - dysuria, hematuria, incontinence or urinary frequency/urgency Musculoskeletal ROS: negative for - joint swelling or muscular weakness Neurological ROS: as noted in HPI Dermatological ROS: negative for rash and skin lesion changes  Physical Examination: Blood pressure 180/70, pulse 78, temperature 98.6 F (37 C), temperature source Oral, resp. rate 17, height 5\' 3"  (1.6 m), weight 71.668 kg (158 lb), SpO2 94 %.  HEENT-  Normocephalic, no lesions, without obvious abnormality.  Normal external eye and conjunctiva.  Normal TM's bilaterally.  Normal auditory canals and external ears. Normal external nose, mucus membranes and septum.  Normal pharynx.  Neurological Examination Mental Status: Alert, oriented, thought content appropriate.  Speech fluent without evidence of aphasia.  Able to follow 3 step commands without difficulty. Cranial Nerves: II: Discs flat bilaterally; Visual fields grossly normal, pupils equal, round, reactive to light and accommodation III,IV, VI: ptosis not present, extra-ocular motions intact bilaterally V,VII: smile symmetric, facial light touch sensation normal bilaterally VIII: hearing normal bilaterally IX,X: gag reflex present XI: bilateral shoulder shrug XII: midline tongue extension Motor: Right : Upper extremity   4/5    Left:     Upper extremity   4/5  Lower extremity   4/5     Lower extremity   5/5 Tone and bulk:normal tone throughout; no atrophy noted Sensory: Pinprick and light touch intact throughout, bilaterally Deep Tendon Reflexes: 1+ and symmetric throughout Plantars: Right: downgoing   Left: downgoing Cerebellar: normal finger-to-nose, normal rapid alternating movements and normal heel-to-shin test Gait: not tested      Laboratory Studies:   Basic  Metabolic Panel:  Recent Labs Lab 12/17/14 1300 12/18/14 0437  NA 133* 138   K 3.8 3.7  CL 99* 102  CO2 22 26  GLUCOSE 189* 190*  BUN 17 17  CREATININE 1.00 0.92  CALCIUM 9.5 9.3    Liver Function Tests:  Recent Labs Lab 12/17/14 1300 12/18/14 0437  AST 29 18  ALT 30 25  ALKPHOS 104 90  BILITOT 0.8 0.8  PROT 8.9* 7.9  ALBUMIN 4.0 3.6   No results for input(s): LIPASE, AMYLASE in the last 168 hours. No results for input(s): AMMONIA in the last 168 hours.  CBC:  Recent Labs Lab 12/17/14 1300 12/18/14 0437  WBC 8.0 6.5  NEUTROABS 5.6  --   HGB 13.5 13.1  HCT 40.8 39.3  MCV 85.2 86.4  PLT 274 251    Cardiac Enzymes:  Recent Labs Lab 12/17/14 1300  TROPONINI <0.03    BNP: Invalid input(s): POCBNP  CBG: No results for input(s): GLUCAP in the last 168 hours.  Microbiology: No results found for this or any previous visit.  Coagulation Studies:  Recent Labs  12/17/14 1300 12/18/14 0437  LABPROT 11.9 12.7  INR 0.86 0.93    Urinalysis: No results for input(s): COLORURINE, LABSPEC, PHURINE, GLUCOSEU, HGBUR, BILIRUBINUR, KETONESUR, PROTEINUR, UROBILINOGEN, NITRITE, LEUKOCYTESUR in the last 168 hours.  Invalid input(s): APPERANCEUR  Lipid Panel:     Component Value Date/Time   CHOL 267* 12/18/2014 0437   TRIG 273* 12/18/2014 0437   HDL 44 12/18/2014 0437   CHOLHDL 6.1 12/18/2014 0437   VLDL 55* 12/18/2014 0437   LDLCALC 168* 12/18/2014 0437    HgbA1C: No results found for: HGBA1C  Urine Drug Screen:  No results found for: LABOPIA, COCAINSCRNUR, LABBENZ, AMPHETMU, THCU, LABBARB  Alcohol Level: No results for input(s): ETH in the last 168 hours.  Other results: EKG: normal EKG, normal sinus rhythm, unchanged from previous tracings.  Imaging: Ct Head Wo Contrast  12/17/2014   CLINICAL DATA:  Right side numbness since 12/16/2014. Initial encounter.  EXAM: CT HEAD WITHOUT CONTRAST  TECHNIQUE: Contiguous axial images were obtained from the base of the skull through the vertex without intravenous contrast.  COMPARISON:   Head CT scan 08/23/2011  FINDINGS: Chronic microvascular ischemic change and a remote right basal ganglia lacunar infarction are again seen. There may be a remote left basal ganglia lacunar infarction versus a dilated perivascular space, unchanged. No evidence of acute abnormality including hemorrhage, infarct, mass lesion, mass effect, midline shift or abnormal extra-axial fluid collection is identified. There is no hydrocephalus or pneumocephalus. The calvarium is intact. Imaged paranasal sinuses and mastoid air cells are clear.  IMPRESSION: No acute abnormality.  Stable compared to prior exam.   Electronically Signed   By: Inge Rise M.D.   On: 12/17/2014 13:38     Assessment/Plan: Kelli Gutierrez is an 64 y.o. 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 On admission SBP was 240.   No acute abnormalities on CTH. MRI L thalamic infarct.  Pt has a lot of giveaway weakness. Likely will improve Increase ASA to full dose of 325 Pt/ot D/c planning  12/18/2014, 7:57 AM

## 2014-12-18 NOTE — Care Management (Signed)
Admitted to [facility] with the diagnosis of CVA. Daughter is Germain Osgood [phone]). Son lives with her in the home. Sees Dr. Brynda Greathouse. Doesn't remember when she seen Dr. Brynda Greathouse last.  Attempted to contact daughter for more information. Left voice mail. Spearman RN MSN Care management [phone]

## 2014-12-18 NOTE — Progress Notes (Signed)
[place] [facility] [place] NAME: Kelli Gutierrez    MR#:  [id number]  DATE OF BIRTH:  28-Mar-1951  SUBJECTIVE:  CHIEF COMPLAINT:   Chief Complaint  Patient presents with  . Stroke Symptoms  right side weakness is still present.  REVIEW OF SYSTEMS:  CONSTITUTIONAL: No fever, fatigue or weakness.  EYES: No blurred or double vision.  EARS, NOSE, AND THROAT: No tinnitus or ear pain.  RESPIRATORY: No cough, shortness of breath, wheezing or hemoptysis.  CARDIOVASCULAR: No chest pain, orthopnea, edema.  GASTROINTESTINAL: No nausea, vomiting, diarrhea or abdominal pain.  GENITOURINARY: No dysuria, hematuria.  ENDOCRINE: No polyuria, nocturia,  HEMATOLOGY: No anemia, easy bruising or bleeding SKIN: No rash or lesion. MUSCULOSKELETAL: No joint pain or arthritis.   NEUROLOGIC: No tingling, numbness, left side old weakness, but new weakness on right side. PSYCHIATRY: No anxiety or depression.   ROS  DRUG ALLERGIES:  No Known Allergies  VITALS:  Blood pressure 149/75, pulse 89, temperature 97.8 F (36.6 C), temperature source Oral, resp. rate 18, height 5\' 3"  (1.6 m), weight 71.668 kg (158 lb), SpO2 96 %.  PHYSICAL EXAMINATION:  GENERAL:  64 y.o.-year-old patient lying in the bed with no acute distress.  EYES: Pupils equal, round, reactive to light and accommodation. No scleral icterus. Extraocular muscles intact.  HEENT: Head atraumatic, normocephalic. Oropharynx and nasopharynx clear.  NECK:  Supple, no jugular venous distention. No thyroid enlargement, no tenderness.  LUNGS: Normal breath sounds bilaterally, no wheezing, rales,rhonchi or crepitation. No use of accessory muscles of respiration.  CARDIOVASCULAR: S1, S2 normal. No murmurs, rubs, or gallops.  ABDOMEN: Soft, nontender, nondistended. Bowel sounds present. No organomegaly or mass.  EXTREMITIES: No pedal edema, cyanosis, or clubbing.  NEUROLOGIC: Cranial nerves II through XII are  intact. Muscle strength 4/5 in left extremities, on right side 3/5 and some muscle twiches present. Sensation intact. Gait not checked.  PSYCHIATRIC: The patient is alert and oriented x 3.  SKIN: No obvious rash, lesion, or ulcer.   Physical Exam LABORATORY PANEL:   CBC  Recent Labs Lab 12/18/14 0437  WBC 6.5  HGB 13.1  HCT 39.3  PLT 251   ------------------------------------------------------------------------------------------------------------------  Chemistries   Recent Labs Lab 12/18/14 0437  NA 138  K 3.7  CL 102  CO2 26  GLUCOSE 190*  BUN 17  CREATININE 0.92  CALCIUM 9.3  AST 18  ALT 25  ALKPHOS 90  BILITOT 0.8   ------------------------------------------------------------------------------------------------------------------  Cardiac Enzymes  Recent Labs Lab 12/17/14 1300  TROPONINI <0.03   ------------------------------------------------------------------------------------------------------------------  RADIOLOGY:  Ct Head Wo Contrast  12/17/2014   CLINICAL DATA:  Right side numbness since 12/16/2014. Initial encounter.  EXAM: CT HEAD WITHOUT CONTRAST  TECHNIQUE: Contiguous axial images were obtained from the base of the skull through the vertex without intravenous contrast.  COMPARISON:  Head CT scan 08/23/2011  FINDINGS: Chronic microvascular ischemic change and a remote right basal ganglia lacunar infarction are again seen. There may be a remote left basal ganglia lacunar infarction versus a dilated perivascular space, unchanged. No evidence of acute abnormality including hemorrhage, infarct, mass lesion, mass effect, midline shift or abnormal extra-axial fluid collection is identified. There is no hydrocephalus or pneumocephalus. The calvarium is intact. Imaged paranasal sinuses and mastoid air cells are clear.  IMPRESSION: No acute abnormality.  Stable compared to prior exam.   Electronically Signed   By: Inge Rise M.D.   On: 12/17/2014 13:38    Ct Head W  Wo Contrast  12/18/2014   CLINICAL DATA:  Stroke.  Followup CT done December 17, 2014.  EXAM: CT HEAD WITHOUT AND WITH CONTRAST  TECHNIQUE: Contiguous axial images were obtained from the base of the skull through the vertex without and with intravenous contrast  CONTRAST:  45mL OMNIPAQUE IOHEXOL 300 MG/ML  SOLN  COMPARISON:  12/17/2014  FINDINGS: Skull and Sinuses:Negative for fracture or destructive process. The mastoids, middle ears, and imaged paranasal sinuses are clear.  Orbits: No acute abnormality.  Brain: The known acute infarct in the left thalamus is now visible. When accounting for streak artifact, there is no evidence of interval infarction compared to previous MRI. Cavity with surrounding encephalomalacia in the right basal ganglia and anterior limb internal capsule related to remote infarct or hemorrhage. Dilated perivascular space below the left putamen. No evidence of mass lesion. No abnormal intracranial enhancement. No enhancing subacute infarct seen.  IMPRESSION: 1. Acute nonhemorrhagic left thalamus infarct. 2. No abnormal intracranial enhancement. 3. Please note that this study is timed for parenchymal rather than angiographic enhancement.   Electronically Signed   By: Monte Fantasia M.D.   On: 12/18/2014 10:22   Mr Jeri Cos [id number] Contrast  12/18/2014   CLINICAL DATA:  Right arm and leg tingling beginning 12/16/2014. Facial tingling on the right. Speech disturbance. Symptoms worsening today.  EXAM: MRI HEAD WITHOUT AND WITH CONTRAST  TECHNIQUE: Multiplanar, multiecho pulse sequences of the brain and surrounding structures were obtained without and with intravenous contrast.  CONTRAST:  21mL MULTIHANCE GADOBENATE DIMEGLUMINE 529 MG/ML IV SOLN  COMPARISON:  Head CT 12/17/2014 and previous  FINDINGS: There is a 1 cm acute infarction affecting the lateral thalamus on the left. No evidence of hemorrhage. No other acute infarction.  Brainstem and cerebellum are normal. Elsewhere in the  cerebral hemispheres, there is old infarction in the basal ganglia which has progressed to atrophy and encephalomalacia. There are a few old small vessel ischemic changes elsewhere in the cerebral white matter bilaterally. There is a small old right posterior frontal cortical and subcortical infarction. Some hemosiderin deposition is present in the region of the old right basal ganglia infarction. No sign of acute hemorrhage. No mass lesion, hydrocephalus or extra-axial collection. No pituitary mass. No inflammatory sinus disease. No abnormal contrast enhancement occurs.  IMPRESSION: 1 cm acute infarction within the lateral thalamus on the left.  Old right basal ganglia and right posterior frontal infarctions.  Mild small-vessel disease of the white matter.   Electronically Signed   By: Nelson Chimes M.D.   On: 12/18/2014 09:56   US Carotid Bilateral  12/18/2014   CLINICAL DATA:  64 year old female with acute left thalamic infarct  EXAM: BILATERAL CAROTID DUPLEX ULTRASOUND  TECHNIQUE: Pearline Cables scale imaging, color Doppler and duplex ultrasound were performed of bilateral carotid and vertebral arteries in the neck.  COMPARISON:  Brain MRI 12/18/2014  FINDINGS: Criteria: Quantification of carotid stenosis is based on velocity parameters that correlate the residual internal carotid diameter with NASCET-based stenosis levels, using the diameter of the distal internal carotid lumen as the denominator for stenosis measurement.  The following velocity measurements were obtained:  RIGHT  ICA:  100/28 cm/sec  CCA:  [id number] cm/sec  SYSTOLIC ICA/CCA RATIO:  1.0  DIASTOLIC ICA/CCA RATIO:  1.4  ECA:  122 cm/sec  LEFT  ICA:  61/17 cm/sec  CCA:  [id number] cm/sec  SYSTOLIC ICA/CCA RATIO:  0.5  DIASTOLIC ICA/CCA RATIO:  0.8  ECA:  106 cm/sec  RIGHT CAROTID ARTERY: Intimal  medial thickening in the distal common carotid artery. There is trace focal heterogeneous atherosclerotic plaque in the carotid bifurcation. No significant internal carotid  plaque or evidence of stenosis.  RIGHT VERTEBRAL ARTERY:  Patent with normal antegrade flow.  LEFT CAROTID ARTERY: No significant atherosclerotic plaque or evidence of stenosis.  LEFT VERTEBRAL ARTERY:  Patent with normal antegrade flow.  Arterial waveforms suggest tachycardia.  IMPRESSION: 1. No evidence of significant internal carotid artery plaque or stenosis. 2. Mild intimal medial thickening and trace focal heterogeneous atherosclerotic plaque in the distal right common carotid artery. 3. Vertebral arteries are patent with normal antegrade flow. 4. Arterial waveforms suggests underlying tachycardia. Signed,  Criselda Peaches, MD  [facility]  [facility]   Electronically Signed   By: Jacqulynn Cadet M.D.   On: 12/18/2014 15:03    ASSESSMENT AND PLAN:   1. 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 Leotis Shames Patient's aspirin dose is increased to 325 mg Checked lipid panel and hemoglobin A1c Patient needs tight control of her risk factors including hypertension and diabetes mellitus PT consult Negative carotid Dopplers, echocardiogram, positive MRI of the brain  2. Malignant hypertension  Allowed Permissive hypertension for initially,   Now Will give metoprolol 50 mg BID.  3. Diabetes mellitus Resume oral meds, and keep on sliding scale.  4. Old history of stroke with chronic residual left-sided weakness Patient is not a candidate for TPA as she had intracranial bleed in the past  5. Chronic history of rheumatoid arthritis Pain medications as needed basis  6. Will provide GI prophylaxis and DVT prophylaxis   All the records are reviewed and case discussed with Care Management/Social Workerr. Management plans discussed with the patient and she is in agreement.  CODE STATUS: full.  TOTAL TIME TAKING CARE OF THIS PATIENT: 30 minutes.    POSSIBLE D/C IN 1-2 DAYS, DEPENDING ON CLINICAL CONDITION.   Vaughan Basta M.D on 12/18/2014   Between 7am to 6pm - Pager - [phone]  After 6pm go to [email or web address] - password [email or web address] [facility]  [facility]  Office  [phone]  CC: Primary care physician; No primary care provider on file.

## 2014-12-18 NOTE — Evaluation (Addendum)
Occupational Therapy Evaluation Patient Details Name: Kelli Gutierrez MRN: [id number] DOB: 04-23-1950 Today's Date: 12/18/2014    History of Present Illness This patinet is a 64 year old female who came to [facility] with stroke symptoms.   Clinical Impression   This patient is a 64 year old female who came to [facility] stroke symptoms R sided weakness. 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  She lives  With her son.  ** had been independent with basic ADL and functional mobility.  She now needs assist and would benefit from Occupational Therapy for ADL/functioal mobility training and R UE restoration.      Follow Up Recommendations  SNF    Equipment Recommendations    continue to evaluate    Recommendations for Other Services       Precautions / Restrictions Precautions Precautions: Fall      Mobility Bed Mobility Overal bed mobility: Modified Independent (supine to sit and sit to supine)                Transfers Overall transfer level:  (sit to stand and stand to sit contact guard and verbal cues for safety)                    Balance                                            ADL                                         General ADL Comments: Patient had  been independent with BADL. She now needs minimal/moderate  assist for dressing lower body. She is dropping her utensils.  Issued built up handle for spoon/fork and instructed patient and family in it's use. Patient practiced using it successfully.     Vision     Perception     Praxis      Pertinent Vitals/Pain       Hand Dominance Right   Extremity/Trunk Assessment Upper Extremity Assessment Upper Extremity Assessment: RUE deficits/detail RUE Deficits / Details: shoulder 3+/5, elbow 4-/5, forearm and wrist 4/5, AROM WNL - RUE Sensation: decreased light touch (decreased temp, decreased sharp.)   Lower Extremity  Assessment Lower Extremity Assessment: Defer to PT evaluation       Communication     Cognition Arousal/Alertness: Awake/alert Behavior During Therapy: WFL for tasks assessed/performed Overall Cognitive Status: Within Functional Limits for tasks assessed                     General Comments       Exercises  Completed active assistive exercises R upper extremity  exercises shoulder flexion, external and internal rotation elbow flexion and extension, forearm supination and pronation, wrist flexion and extension, and hand flexion and extension 10 repetitions.      Shoulder Instructions      Home Living Family/patient expects to be discharged to:: Private residence Living Arrangements: Children (Lives with son) Available Help at Discharge: Family                                    Prior Functioning/Environment Level of Independence:  (  Independent with basic ADL)             OT Diagnosis: Paresis   OT Problem List:     OT Treatment/Interventions:      OT Goals(Current goals can be found in the care plan section) Acute Rehab OT Goals Patient Stated Goal: to go home OT Goal Formulation: With patient/family Time For Goal Achievement: 01/01/15 Potential to Achieve Goals: Good  OT Frequency:     Barriers to D/C:            Co-evaluation              End of Session Equipment Utilized During Treatment:  (stroke test kit)  Activity Tolerance: Patient tolerated treatment well Patient left: in bed;with call bell/phone within reach;with bed alarm set;with family/visitor present   Time: 1550-1616 OT Time Calculation (min): 26 min Charges:  OT General Charges $OT Visit: 1 Procedure OT Evaluation $Initial OT Evaluation Tier I: 1 Procedure OT Treatments $Therapeutic Exercise: 8-22 mins G-Codes:    Myrene Galas, MS/OTR/L  12/18/2014, 5:36 PM

## 2014-12-18 NOTE — Plan of Care (Signed)
Problem: Acute Treatment Outcomes Goal: BP within ordered parameters Outcome: Progressing BP better, parameters ordered for systolic BP greater then [id number].

## 2014-12-18 NOTE — Plan of Care (Signed)
Problem: Acute Treatment Outcomes Goal: Neuro exam at baseline or improved Outcome: Progressing NIH had increased from a 6 to a 10, stat CT and MRI ordered.

## 2014-12-18 NOTE — Plan of Care (Signed)
Problem: Discharge/Transitional Outcomes Goal: Barriers To Progression Addressed/Resolved Outcome: Progressing Social work involved with pt care, pt states she takes care of her disabled son and will no longer be able to do so anymore. Awaiting neurology and PT to consult pt. Possible need for rehabilitation.

## 2014-12-19 LAB — GLUCOSE, CAPILLARY
GLUCOSE-CAPILLARY: 228 mg/dL — AB (ref 65–99)
Glucose-Capillary: 184 mg/dL — ABNORMAL HIGH (ref 65–99)

## 2014-12-19 MED ORDER — METOPROLOL TARTRATE 50 MG PO TABS: 50.0000 mg | ORAL_TABLET | Freq: Two times a day (BID) | ORAL | Status: DC

## 2014-12-19 MED ORDER — ATORVASTATIN CALCIUM 80 MG PO TABS: 80.0000 mg | ORAL_TABLET | Freq: Every day | ORAL | Status: DC

## 2014-12-19 MED ORDER — AMLODIPINE BESYLATE 5 MG PO TABS
5.0000 mg | ORAL_TABLET | Freq: Every day | ORAL | Status: DC
  Administered 2014-12-19: 5 mg via ORAL
  Filled 2014-12-19: qty 1

## 2014-12-19 MED ORDER — ASPIRIN 325 MG PO TBEC: 325.0000 mg | DELAYED_RELEASE_TABLET | Freq: Every day | ORAL | Status: DC

## 2014-12-19 NOTE — Evaluation (Signed)
Physical Therapy Evaluation Patient Details Name: Kelli Gutierrez MRN: [id number] DOB: November 20, 1950 Today's Date: 12/19/2014   History of Present Illness  64 yo female with onset of stroke affecting R side coordination and sensation, with CT/MRI changes with L thalamic infarct, old changes of basal ganglia and R posterior frontal infarct  Clinical Impression  Pt was able to complete PT evaluation and is stable to walk on walker but does not have endurance and control for all her household management.  Her son is cared for by pt but can be somewhat assisting to pt.  Her daughter is coming to assist and for safety, so will continue her care with family assist and RW/HHPT    Follow Up Recommendations Home health PT;Supervision/Assistance - 24 hour    Equipment Recommendations  Rolling walker with 5" wheels    Recommendations for Other Services       Precautions / Restrictions Precautions Precautions: Fall Restrictions Weight Bearing Restrictions: No      Mobility  Bed Mobility Overal bed mobility: Modified Independent                Transfers Overall transfer level: Modified independent Equipment used: Rolling walker (2 wheeled)             General transfer comment: using hands on walker and PT corrected her  Ambulation/Gait Ambulation/Gait assistance: Supervision (for safety) Ambulation Distance (Feet): 350 Feet Assistive device: Rolling walker (2 wheeled) Gait Pattern/deviations: Step-through pattern;Decreased dorsiflexion - right;Narrow base of support;Trunk flexed;Drifts right/left (R foot slaps as pt fatigues) Gait velocity: reduced Gait velocity interpretation: Below normal speed for age/gender    Stairs            Wheelchair Mobility    Modified Rankin (Stroke Patients Only) Modified Rankin (Stroke Patients Only) Pre-Morbid Rankin Score: No significant disability Modified Rankin: Moderately severe disability     Balance Overall balance  assessment: Needs assistance Sitting-balance support: Feet supported Sitting balance-Leahy Scale: Good   Postural control: Posterior lean Standing balance support: Bilateral upper extremity supported Standing balance-Leahy Scale: Fair                               Pertinent Vitals/Pain Pain Assessment: No/denies pain    Home Living Family/patient expects to be discharged to:: Private residence Living Arrangements: Children Available Help at Discharge: Family Type of Home: House Home Access: Stairs to enter Entrance Stairs-Rails: None Technical brewer of Steps: 1 Home Layout: One level Home Equipment: None      Prior Function Level of Independence: Independent               Hand Dominance   Dominant Hand: Right    Extremity/Trunk Assessment   Upper Extremity Assessment: RUE deficits/detail RUE Deficits / Details: mild R UE weakness         Lower Extremity Assessment: RLE deficits/detail RLE Deficits / Details: sensory and coordination losses    Cervical / Trunk Assessment: Normal  Communication   Communication: No difficulties;Prefers language other than English (understandable in [place])  Cognition Arousal/Alertness: Awake/alert Behavior During Therapy: WFL for tasks assessed/performed Overall Cognitive Status: Within Functional Limits for tasks assessed                      General Comments General comments (skin integrity, edema, etc.): has good tolerance for mobility and PT expresed concern about managing the household, but pt's daughter is coming to help her for  a few days    Exercises        Assessment/Plan    PT Assessment Patient needs continued PT services  PT Diagnosis Hemiplegia dominant side   PT Problem List Decreased strength;Decreased activity tolerance;Decreased balance;Decreased mobility;Decreased coordination;Cardiopulmonary status limiting activity  PT Treatment Interventions DME instruction;Gait  training;Stair training;Functional mobility training;Therapeutic activities;Therapeutic exercise;Balance training;Neuromuscular re-education;Patient/family education   PT Goals (Current goals can be found in the Care Plan section) Acute Rehab PT Goals Patient Stated Goal: to go home PT Goal Formulation: With patient Time For Goal Achievement: 01/02/15 Potential to Achieve Goals: Good    Frequency 7X/week   Barriers to discharge Decreased caregiver support has daughter coming to help    Co-evaluation               End of Session Equipment Utilized During Treatment: Gait belt Activity Tolerance: Patient tolerated treatment well Patient left: in bed;with call bell/phone within reach;with bed alarm set Nurse Communication: Mobility status         Time: 0833-0900 PT Time Calculation (min) (ACUTE ONLY): 27 min   Charges:   PT Evaluation $Initial PT Evaluation Tier I: 1 Procedure PT Treatments $Gait Training: 8-22 mins   PT G CodesRamond Dial 12-30-2014, 9:55 AM  Mee Hives, PT MS Acute Rehab Dept. Number: [facility] [id number] and [place] [phone]

## 2014-12-19 NOTE — Consult Note (Signed)
CC: L sided weakness   HPI: Kelli Gutierrez is an 64 y.o. 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 On admission SBP was 240.    R thalamic stroke  Past Medical History  Diagnosis Date  . Diabetes mellitus without complication   . Hypertension   . Rheumatoid arthritis   . Stroke     Past Surgical History  Procedure Laterality Date  . Middle ear surgery      History reviewed. No pertinent family history.  Social History:  reports that she has never smoked. She does not have any smokeless tobacco history on file. She reports that she does not drink alcohol. Her drug history is not on file.  No Known Allergies  Medications: I have reviewed the patient's current medications.  ROS: History obtained from the patient  General ROS: negative for - chills, fatigue, fever, night sweats, weight gain or weight loss Psychological ROS: negative for - behavioral disorder, hallucinations, memory difficulties, mood swings or suicidal ideation Ophthalmic ROS: negative for - blurry vision, double vision, eye pain or loss of vision ENT ROS: negative for - epistaxis, nasal discharge, oral lesions, sore throat, tinnitus or vertigo Allergy and Immunology ROS: negative for - hives or itchy/watery eyes Hematological and Lymphatic ROS: negative for - bleeding problems, bruising or swollen lymph nodes Endocrine ROS: negative for - galactorrhea, hair pattern changes, polydipsia/polyuria or temperature intolerance Respiratory ROS: negative for - cough, hemoptysis, shortness of breath or wheezing Cardiovascular ROS: negative for - chest pain, dyspnea on exertion, edema or irregular  heartbeat Gastrointestinal ROS: negative for - abdominal pain, diarrhea, hematemesis, nausea/vomiting or stool incontinence Genito-Urinary ROS: negative for - dysuria, hematuria, incontinence or urinary frequency/urgency Musculoskeletal ROS: negative for - joint swelling or muscular weakness Neurological ROS: as noted in HPI Dermatological ROS: negative for rash and skin lesion changes  Physical Examination: Blood pressure 178/62, pulse 67, temperature 97.7 F (36.5 C), temperature source Oral, resp. rate 16, height 5\' 3"  (1.6 m), weight 71.668 kg (158 lb), SpO2 92 %.  HEENT-  Normocephalic, no lesions, without obvious abnormality.  Normal external eye and conjunctiva.  Normal TM's bilaterally.  Normal auditory canals and external ears. Normal external nose, mucus membranes and septum.  Normal pharynx.  Neurological Examination Mental Status: Alert, oriented, thought content appropriate.  Speech fluent without evidence of aphasia.  Able to follow 3 step commands without difficulty. Cranial Nerves: II: Discs flat bilaterally; Visual fields grossly normal, pupils equal, round, reactive to light and accommodation III,IV, VI: ptosis not present, extra-ocular motions intact bilaterally V,VII: smile symmetric, facial light touch sensation normal bilaterally VIII: hearing normal bilaterally IX,X: gag reflex present XI: bilateral shoulder shrug XII: midline tongue extension Motor: Right : Upper extremity   4/5    Left:     Upper extremity   4/5  Lower extremity   4/5     Lower extremity   5/5 Tone and bulk:normal tone throughout; no atrophy noted Sensory: Pinprick and light touch intact throughout, bilaterally Deep Tendon Reflexes: 1+ and symmetric throughout Plantars: Right: downgoing   Left: downgoing Cerebellar: normal finger-to-nose, normal rapid alternating movements and normal heel-to-shin test Gait: not tested      Laboratory  Studies:   Basic Metabolic Panel:  Recent  Labs Lab 12/17/14 1300 12/18/14 0437  NA 133* 138  K 3.8 3.7  CL 99* 102  CO2 22 26  GLUCOSE 189* 190*  BUN 17 17  CREATININE 1.00 0.92  CALCIUM 9.5 9.3    Liver Function Tests:  Recent Labs Lab 12/17/14 1300 12/18/14 0437  AST 29 18  ALT 30 25  ALKPHOS 104 90  BILITOT 0.8 0.8  PROT 8.9* 7.9  ALBUMIN 4.0 3.6   No results for input(s): LIPASE, AMYLASE in the last 168 hours. No results for input(s): AMMONIA in the last 168 hours.  CBC:  Recent Labs Lab 12/17/14 1300 12/18/14 0437  WBC 8.0 6.5  NEUTROABS 5.6  --   HGB 13.5 13.1  HCT 40.8 39.3  MCV 85.2 86.4  PLT 274 251    Cardiac Enzymes:  Recent Labs Lab 12/17/14 1300  TROPONINI <0.03    BNP: Invalid input(s): POCBNP  CBG:  Recent Labs Lab 12/17/14 1337 12/18/14 2322 12/19/14 0730  GLUCAP 203* 193* 228*    Microbiology: No results found for this or any previous visit.  Coagulation Studies:  Recent Labs  12/17/14 1300 12/18/14 0437  LABPROT 11.9 12.7  INR 0.86 0.93    Urinalysis: No results for input(s): COLORURINE, LABSPEC, PHURINE, GLUCOSEU, HGBUR, BILIRUBINUR, KETONESUR, PROTEINUR, UROBILINOGEN, NITRITE, LEUKOCYTESUR in the last 168 hours.  Invalid input(s): APPERANCEUR  Lipid Panel:     Component Value Date/Time   CHOL 267* 12/18/2014 0437   TRIG 273* 12/18/2014 0437   HDL 44 12/18/2014 0437   CHOLHDL 6.1 12/18/2014 0437   VLDL 55* 12/18/2014 0437   LDLCALC 168* 12/18/2014 0437    HgbA1C:  Lab Results  Component Value Date   HGBA1C 8.4* 12/18/2014    Urine Drug Screen:  No results found for: LABOPIA, COCAINSCRNUR, LABBENZ, AMPHETMU, THCU, LABBARB  Alcohol Level: No results for input(s): ETH in the last 168 hours.  Other results: EKG: normal EKG, normal sinus rhythm, unchanged from previous tracings.  Imaging: Ct Head Wo Contrast  12/17/2014   CLINICAL DATA:  Right side numbness since 12/16/2014. Initial encounter.  EXAM: CT HEAD WITHOUT CONTRAST  TECHNIQUE:  Contiguous axial images were obtained from the base of the skull through the vertex without intravenous contrast.  COMPARISON:  Head CT scan 08/23/2011  FINDINGS: Chronic microvascular ischemic change and a remote right basal ganglia lacunar infarction are again seen. There may be a remote left basal ganglia lacunar infarction versus a dilated perivascular space, unchanged. No evidence of acute abnormality including hemorrhage, infarct, mass lesion, mass effect, midline shift or abnormal extra-axial fluid collection is identified. There is no hydrocephalus or pneumocephalus. The calvarium is intact. Imaged paranasal sinuses and mastoid air cells are clear.  IMPRESSION: No acute abnormality.  Stable compared to prior exam.   Electronically Signed   By: Inge Rise M.D.   On: 12/17/2014 13:38   Ct Head W Wo Contrast  12/18/2014   CLINICAL DATA:  Stroke.  Followup CT done December 17, 2014.  EXAM: CT HEAD WITHOUT AND WITH CONTRAST  TECHNIQUE: Contiguous axial images were obtained from the base of the skull through the vertex without and with intravenous contrast  CONTRAST:  61mL OMNIPAQUE IOHEXOL 300 MG/ML  SOLN  COMPARISON:  12/17/2014  FINDINGS: Skull and Sinuses:Negative for fracture or destructive process. The mastoids, middle ears, and imaged paranasal sinuses are clear.  Orbits: No acute abnormality.  Brain: The known acute infarct in the left thalamus  is now visible. When accounting for streak artifact, there is no evidence of interval infarction compared to previous MRI. Cavity with surrounding encephalomalacia in the right basal ganglia and anterior limb internal capsule related to remote infarct or hemorrhage. Dilated perivascular space below the left putamen. No evidence of mass lesion. No abnormal intracranial enhancement. No enhancing subacute infarct seen.  IMPRESSION: 1. Acute nonhemorrhagic left thalamus infarct. 2. No abnormal intracranial enhancement. 3. Please note that this study is timed for  parenchymal rather than angiographic enhancement.   Electronically Signed   By: Monte Fantasia M.D.   On: 12/18/2014 10:22   Mr Jeri Cos [id number] Contrast  12/18/2014   CLINICAL DATA:  Right arm and leg tingling beginning 12/16/2014. Facial tingling on the right. Speech disturbance. Symptoms worsening today.  EXAM: MRI HEAD WITHOUT AND WITH CONTRAST  TECHNIQUE: Multiplanar, multiecho pulse sequences of the brain and surrounding structures were obtained without and with intravenous contrast.  CONTRAST:  34mL MULTIHANCE GADOBENATE DIMEGLUMINE 529 MG/ML IV SOLN  COMPARISON:  Head CT 12/17/2014 and previous  FINDINGS: There is a 1 cm acute infarction affecting the lateral thalamus on the left. No evidence of hemorrhage. No other acute infarction.  Brainstem and cerebellum are normal. Elsewhere in the cerebral hemispheres, there is old infarction in the basal ganglia which has progressed to atrophy and encephalomalacia. There are a few old small vessel ischemic changes elsewhere in the cerebral white matter bilaterally. There is a small old right posterior frontal cortical and subcortical infarction. Some hemosiderin deposition is present in the region of the old right basal ganglia infarction. No sign of acute hemorrhage. No mass lesion, hydrocephalus or extra-axial collection. No pituitary mass. No inflammatory sinus disease. No abnormal contrast enhancement occurs.  IMPRESSION: 1 cm acute infarction within the lateral thalamus on the left.  Old right basal ganglia and right posterior frontal infarctions.  Mild small-vessel disease of the white matter.   Electronically Signed   By: Nelson Chimes M.D.   On: 12/18/2014 09:56   US Carotid Bilateral  12/18/2014   CLINICAL DATA:  64 year old female with acute left thalamic infarct  EXAM: BILATERAL CAROTID DUPLEX ULTRASOUND  TECHNIQUE: Pearline Cables scale imaging, color Doppler and duplex ultrasound were performed of bilateral carotid and vertebral arteries in the neck.  COMPARISON:   Brain MRI 12/18/2014  FINDINGS: Criteria: Quantification of carotid stenosis is based on velocity parameters that correlate the residual internal carotid diameter with NASCET-based stenosis levels, using the diameter of the distal internal carotid lumen as the denominator for stenosis measurement.  The following velocity measurements were obtained:  RIGHT  ICA:  100/28 cm/sec  CCA:  [id number] cm/sec  SYSTOLIC ICA/CCA RATIO:  1.0  DIASTOLIC ICA/CCA RATIO:  1.4  ECA:  122 cm/sec  LEFT  ICA:  61/17 cm/sec  CCA:  [id number] cm/sec  SYSTOLIC ICA/CCA RATIO:  0.5  DIASTOLIC ICA/CCA RATIO:  0.8  ECA:  106 cm/sec  RIGHT CAROTID ARTERY: Intimal medial thickening in the distal common carotid artery. There is trace focal heterogeneous atherosclerotic plaque in the carotid bifurcation. No significant internal carotid plaque or evidence of stenosis.  RIGHT VERTEBRAL ARTERY:  Patent with normal antegrade flow.  LEFT CAROTID ARTERY: No significant atherosclerotic plaque or evidence of stenosis.  LEFT VERTEBRAL ARTERY:  Patent with normal antegrade flow.  Arterial waveforms suggest tachycardia.  IMPRESSION: 1. No evidence of significant internal carotid artery plaque or stenosis. 2. Mild intimal medial thickening and trace focal heterogeneous atherosclerotic plaque in the distal right common  carotid artery. 3. Vertebral arteries are patent with normal antegrade flow. 4. Arterial waveforms suggests underlying tachycardia. Signed,  Criselda Peaches, MD  [facility]  [facility]   Electronically Signed   By: Jacqulynn Cadet M.D.   On: 12/18/2014 15:03     Assessment/Plan: CADEDRA ABDULLAH is an 64 y.o. female with a known history of prior CVA in year 1999 with chronic left-sided weakness and deviation of the angle of the mouth to the right side, had intracranial bleed after giving TPA , currently on baby aspirin, completed with the medication is presenting to the ED with a chief complaint of  right upper extremity tingling, numbness and weakness. On admission SBP was 240.    Strength improved Only complaint of numbness on L side  No acute abnormalities on CTH. MRI L thalamic infarct.  On ASA 325 Pt/ot D/c planning  Call with questions  12/19/2014, 11:19 AM

## 2014-12-19 NOTE — Plan of Care (Signed)
Problem: Consults Goal: Ischemic Stroke Patient Education See Patient Education Module for education specifics.  Outcome: Adequate for Discharge Stroke education handout discussed with patient. Goal: Diabetes Guidelines if Diabetic/Glucose > 140 If diabetic or lab glucose is > 140 mg/dl - Initiate Diabetes/Hyperglycemia Guidelines & Document Interventions  Outcome: Progressing Patient currently on sliding scale for Diabetes management.  No coverage needed this shift per sliding scale.  Problem: Acute Treatment Outcomes Goal: Neuro exam at baseline or improved Outcome: Progressing NIH scale performed at beginning of shift with additional neuro assessments as ordered throughout shift.  Patient problems are slowly progressing as noted per previous shift exam.  Patient able to move right arm with some drift.    BP over ordered parameters this shift.  Physician added Metoprolol which was administered to patient with effective results.  O2 stats >94% this shift - stat of 96%.  Hemodynamically stable.  Patient afebrile and VSS throughout shift.  BP 175/82 mmHg  Pulse 84  Temp(Src) 98.1 F (36.7 C) (Oral)  Resp 18  Ht 5\' 3"  (1.6 m)  Wt 158 lb (71.668 kg)  BMI 28.00 kg/m2  SpO2 96%  LMP    Prognosis discussed with patient.  Discussed increasing strength in upper right side.     Problem: Discharge/Transitional Outcomes Goal: Family/Caregiver willing and able to support plan Family/Caregiver willing and able to support plan for self-management after transition home  Outcome: Not Progressing According to patient she is worried about caring for disabled son at home.

## 2014-12-19 NOTE — Care Management (Signed)
Physical therapy evaluation completed. Recommends home health, physical therapy, and rolling walker. Last home health in 1999. Doesn't remember name of agency.  Will Anderson from [facility] will bring rolling walker. Services will be provided by [facility]. States it is just her and her son in the home, but daughter, Billie Ruddy, helps out. Ms. Raisner telephone number is [phone]) No skilled facility. No home oxygen. Good appetite. No falls. States she is scheduled to see Dr. Brynda Greathouse this Friday or next Friday at 10:00am Discharge to home today. Shelbie Ammons RN MSN Care Management [phone]

## 2014-12-19 NOTE — Discharge Summary (Signed)
[place] [facility] [place] NAME: Kelli Gutierrez    MR#:  [id number]  DATE OF BIRTH:  12/10/1950  DATE OF ADMISSION:  12/17/2014 ADMITTING PHYSICIAN: Nicholes Mango, MD  DATE OF DISCHARGE: No discharge date for patient encounter.  PRIMARY CARE PHYSICIAN: No primary care provider on file.    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  SECONDARY DIAGNOSIS:   Past Medical History  Diagnosis Date  . Diabetes mellitus without complication   . Hypertension   . Rheumatoid arthritis   . Stroke     HOSPITAL COURSE:   1. 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 Leotis Shames Patient's aspirin dose is increased to 325 mg Checked lipid panel and hemoglobin A1c- all high- advised her for better diet control. Patient needs tight control of her risk factors including hypertension and diabetes mellitus PT consult- suggested Home health. Negative carotid Dopplers, echocardiogram, positive MRI of the brain  2. Malignant hypertension  Allowed Permissive hypertension for initially,  Now Will give metoprolol 50 mg BID.  She was taking Metoprolol 25 daily- but in creased due to Higher BP.  3. Diabetes mellitus Resume oral meds, and keep on sliding scale.  HBA1C is high- encouraged better diet control.  4. 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  5. Chronic history of rheumatoid arthritis Pain medications as needed basis.   DISCHARGE CONDITIONS:    Stable.  CONSULTS OBTAINED:  Treatment Team:  Leotis Pain, MD Vaughan Basta, MD  DRUG ALLERGIES:  No Known Allergies  DISCHARGE MEDICATIONS:   Current Discharge Medication List    START taking these medications   Details  atorvastatin (LIPITOR) 80 MG tablet Take 1 tablet (80 mg total) by mouth daily at 6 PM. Qty: 30 tablet, Refills: 0      CONTINUE these medications which have CHANGED   Details  aspirin EC 325 MG EC tablet Take 1 tablet (325 mg total) by mouth daily. Qty: 30 tablet, Refills: 0    metoprolol (LOPRESSOR) 50 MG tablet Take 1 tablet (50 mg total) by mouth 2 (two) times daily. Qty: 60 tablet, Refills: 0      CONTINUE these medications which have NOT CHANGED   Details  glipiZIDE-metformin (METAGLIP) 5-500 MG per tablet Take 1 tablet by mouth 2 (two) times daily.    Tofacitinib Citrate (XELJANZ) 5 MG TABS Take 5 mg by mouth 2 (two) times daily.         DISCHARGE INSTRUCTIONS:    Follow with PMD in 1-2 weeks. BLOOD PRESSURE, CHOLESTEROL AND BLOOD SUGAR ALL WERE HIGH IN HOSPITAL. ADVISED TO HAVE BETTER DIET CONTROL. CHANGED DOSES OF MEDICATIONS.  If you experience worsening of your admission symptoms, develop shortness of breath, life threatening emergency, suicidal or homicidal thoughts you must seek medical attention immediately by calling 911 or calling your MD immediately  if symptoms less severe.  You Must read complete instructions/literature along with all the possible adverse reactions/side effects for all the Medicines you take and that have been prescribed to you. Take  any new Medicines after you have completely understood and accept all the possible adverse reactions/side effects.   Please note  You were cared for by a hospitalist during your hospital stay. If you have any questions about your discharge medications or the care you received while you were in the hospital after you are discharged, you can call the unit and asked to  speak with the hospitalist on call if the hospitalist that took care of you is not available. Once you are discharged, your primary care physician will handle any further medical issues. Please note that NO REFILLS for any discharge medications will be authorized once you are discharged, as it is imperative that you return to your primary care physician (or establish a relationship with a primary care physician if you do not have one) for your aftercare needs so that they can reassess your need for medications and monitor your lab values.    Today   CHIEF COMPLAINT:   Chief Complaint  Patient presents with  . Stroke Symptoms    HISTORY OF PRESENT ILLNESS:  Kelli Gutierrez  is a 64 y.o. 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 Denies any headache or blurry vision. Denies any dysarthria. Patient reports that her symptoms were started at 6 PM yesterday and the right upper extremity weakness is getting worse with pronator drift. CT head is negative. Hospitalist team is called to admit the patient. The patient's blood pressure is very high with systolic being at [id number]  VITAL SIGNS:  Blood pressure 178/62, pulse 67, temperature 97.7 F (36.5 C), temperature source Oral, resp. rate 16, height 5\' 3"  (1.6 m), weight 71.668 kg (158 lb), SpO2 92 %.  I/O:    Intake/Output Summary (Last 24 hours) at 12/19/14 1117 Last data filed at 12/19/14 0900  Gross per 24 hour  Intake    480 ml  Output    300 ml  Net    180 ml    PHYSICAL EXAMINATION:   GENERAL: 64 y.o.-year-old patient lying in the bed with no acute distress.  EYES: Pupils equal, round, reactive to light and accommodation. No scleral icterus.  Extraocular muscles intact.  HEENT: Head atraumatic, normocephalic. Oropharynx and nasopharynx clear.  NECK: Supple, no jugular venous distention. No thyroid enlargement, no tenderness.  LUNGS: Normal breath sounds bilaterally, no wheezing, rales,rhonchi or crepitation. No use of accessory muscles of respiration.  CARDIOVASCULAR: S1, S2 normal. No murmurs, rubs, or gallops.  ABDOMEN: Soft, nontender, nondistended. Bowel sounds present. No organomegaly or mass.  EXTREMITIES: No pedal edema, cyanosis, or clubbing.  NEUROLOGIC: Cranial nerves II through XII are intact. Muscle strength 4/5 in left extremities, on right side 3/5 and some muscle twiches present. Sensation intact. Gait not checked.  PSYCHIATRIC: The patient is alert and oriented x 3.  SKIN: No obvious rash, lesion, or ulcer.   DATA REVIEW:   CBC  Recent Labs Lab 12/18/14 0437  WBC 6.5  HGB 13.1  HCT 39.3  PLT 251    Chemistries   Recent Labs Lab 12/18/14 0437  NA 138  K 3.7  CL 102  CO2 26  GLUCOSE 190*  BUN 17  CREATININE 0.92  CALCIUM 9.3  AST 18  ALT 25  ALKPHOS 90  BILITOT 0.8    Cardiac Enzymes  Recent Labs Lab 12/17/14 1300  TROPONINI <0.03    Microbiology Results  No results found for this or any previous visit.  RADIOLOGY:  Ct Head Wo Contrast  12/17/2014   CLINICAL DATA:  Right side numbness since 12/16/2014. Initial encounter.  EXAM: CT HEAD WITHOUT CONTRAST  TECHNIQUE: Contiguous axial images were obtained from the base of the skull through the vertex without intravenous contrast.  COMPARISON:  Head CT scan 08/23/2011  FINDINGS: Chronic microvascular ischemic change and a remote right basal ganglia lacunar infarction are again seen. There may be a remote left basal ganglia lacunar infarction versus a dilated perivascular space, unchanged. No evidence of acute abnormality including hemorrhage, infarct, mass lesion, mass effect, midline shift or abnormal extra-axial fluid collection  is identified. There is no hydrocephalus or pneumocephalus. The calvarium is intact. Imaged paranasal sinuses and mastoid air cells are clear.  IMPRESSION: No acute abnormality.  Stable compared to prior exam.   Electronically Signed   By: Inge Rise M.D.   On: 12/17/2014 13:38   Ct Head W Wo Contrast  12/18/2014   CLINICAL DATA:  Stroke.  Followup CT done December 17, 2014.  EXAM: CT HEAD WITHOUT AND WITH CONTRAST  TECHNIQUE: Contiguous axial images were obtained from the base of the skull through the vertex without and with intravenous contrast  CONTRAST:  50mL OMNIPAQUE IOHEXOL 300 MG/ML  SOLN  COMPARISON:  12/17/2014  FINDINGS: Skull and Sinuses:Negative for fracture or destructive process. The mastoids, middle ears, and imaged paranasal sinuses are clear.  Orbits: No acute abnormality.  Brain: The known acute infarct in the left thalamus is now visible. When accounting for streak artifact, there is no evidence of interval infarction compared to previous MRI. Cavity with surrounding encephalomalacia in the right basal ganglia and anterior limb internal capsule related to remote infarct or hemorrhage. Dilated perivascular space below the left putamen. No evidence of mass lesion. No abnormal intracranial enhancement. No enhancing subacute infarct seen.  IMPRESSION: 1. Acute nonhemorrhagic left thalamus infarct. 2. No abnormal intracranial enhancement. 3. Please note that this study is timed for parenchymal rather than angiographic enhancement.   Electronically Signed   By: Monte Fantasia M.D.   On: 12/18/2014 10:22   Mr Jeri Cos [id number] Contrast  12/18/2014   CLINICAL DATA:  Right arm and leg tingling beginning 12/16/2014. Facial tingling on the right. Speech disturbance. Symptoms worsening today.  EXAM: MRI HEAD WITHOUT AND WITH CONTRAST  TECHNIQUE: Multiplanar, multiecho pulse sequences of the brain and surrounding structures were obtained without and with intravenous contrast.  CONTRAST:  70mL MULTIHANCE  GADOBENATE DIMEGLUMINE 529 MG/ML IV SOLN  COMPARISON:  Head CT 12/17/2014 and previous  FINDINGS: There is a 1 cm acute infarction affecting the lateral thalamus on the left. No evidence of hemorrhage. No other acute infarction.  Brainstem and cerebellum are normal. Elsewhere in the cerebral hemispheres, there is old infarction in the basal ganglia which has progressed to atrophy and encephalomalacia. There are a few old small vessel ischemic changes elsewhere in the cerebral white matter bilaterally. There is a small old right posterior frontal cortical and subcortical infarction. Some hemosiderin deposition is present in the region of the old right basal ganglia infarction. No sign of acute hemorrhage. No mass lesion, hydrocephalus or extra-axial collection. No pituitary mass. No inflammatory sinus disease. No abnormal contrast enhancement occurs.  IMPRESSION: 1 cm acute infarction within the lateral thalamus on  the left.  Old right basal ganglia and right posterior frontal infarctions.  Mild small-vessel disease of the white matter.   Electronically Signed   By: Nelson Chimes M.D.   On: 12/18/2014 09:56   US Carotid Bilateral  12/18/2014   CLINICAL DATA:  64 year old female with acute left thalamic infarct  EXAM: BILATERAL CAROTID DUPLEX ULTRASOUND  TECHNIQUE: Pearline Cables scale imaging, color Doppler and duplex ultrasound were performed of bilateral carotid and vertebral arteries in the neck.  COMPARISON:  Brain MRI 12/18/2014  FINDINGS: Criteria: Quantification of carotid stenosis is based on velocity parameters that correlate the residual internal carotid diameter with NASCET-based stenosis levels, using the diameter of the distal internal carotid lumen as the denominator for stenosis measurement.  The following velocity measurements were obtained:  RIGHT  ICA:  100/28 cm/sec  CCA:  [id number] cm/sec  SYSTOLIC ICA/CCA RATIO:  1.0  DIASTOLIC ICA/CCA RATIO:  1.4  ECA:  122 cm/sec  LEFT  ICA:  61/17 cm/sec  CCA:  [id number]  cm/sec  SYSTOLIC ICA/CCA RATIO:  0.5  DIASTOLIC ICA/CCA RATIO:  0.8  ECA:  106 cm/sec  RIGHT CAROTID ARTERY: Intimal medial thickening in the distal common carotid artery. There is trace focal heterogeneous atherosclerotic plaque in the carotid bifurcation. No significant internal carotid plaque or evidence of stenosis.  RIGHT VERTEBRAL ARTERY:  Patent with normal antegrade flow.  LEFT CAROTID ARTERY: No significant atherosclerotic plaque or evidence of stenosis.  LEFT VERTEBRAL ARTERY:  Patent with normal antegrade flow.  Arterial waveforms suggest tachycardia.  IMPRESSION: 1. No evidence of significant internal carotid artery plaque or stenosis. 2. Mild intimal medial thickening and trace focal heterogeneous atherosclerotic plaque in the distal right common carotid artery. 3. Vertebral arteries are patent with normal antegrade flow. 4. Arterial waveforms suggests underlying tachycardia. Signed,  Criselda Peaches, MD  [facility]  [facility]   Electronically Signed   By: Jacqulynn Cadet M.D.   On: 12/18/2014 15:03      Management plans discussed with the patient, family and they are in agreement.  CODE STATUS:     Code Status Orders        Start     Ordered   12/17/14 1608  Full code   Continuous     12/17/14 1607      TOTAL TIME TAKING CARE OF THIS PATIENT: 35 minutes.    Vaughan Basta M.D on 12/19/2014 at 11:17 AM  Between 7am to 6pm - Pager - [phone]  After 6pm go to [email or web address] - password [email or web address] [facility]  [facility]  Office  [phone]  CC: Primary care physician; No primary care provider on file.

## 2014-12-19 NOTE — Progress Notes (Signed)
Patient discharged home per MD orders. VSS. Stroke information given to patient. All discharge instructions given and all questions answered.

## 2014-12-19 NOTE — Progress Notes (Signed)
Inpatient Diabetes Program Recommendations  AACE/ADA: New Consensus Statement on Inpatient Glycemic Control (2013)  Target Ranges:  Prepandial:   less than 140 mg/dL      Peak postprandial:   less than 180 mg/dL (1-2 hours)      Critically ill patients:  140 - 180 mg/dL   Results for ROKAYA, BOGACZ (MRN [id number]) as of 12/19/2014 08:22  Ref. Range 12/17/2014 13:37 12/18/2014 23:22 12/19/2014 07:30  Glucose-Capillary Latest Ref Range: 65-99 mg/dL 203 (H) 193 (H) 228 (H)    Reason for Glycemic review; elevated A1C / CBG  Diabetes history: Type 2 Outpatient Diabetes medications: Glipizide/Metformin 5/500mg   Current orders for Inpatient glycemic control: Novolog 0-9 units tid, Novolog 0-5 units qhs, Glucophage 500mg  bid, Glucotrol 5mg  bid  Fasting blood sugar and A1C elevated- consider low dose Lantus in the hospital; Lantus 8 units qhs beginning tonight.  Consider increasing Metformin on discharge.   Gentry Fitz, RN, BA, MHA, CDE Diabetes Coordinator Inpatient Diabetes Program  [phone] (Team Pager) [phone] ([place]) 12/19/2014 8:25 AM

## 2014-12-19 NOTE — Plan of Care (Signed)
Problem: Acute Rehab PT Goals(only PT should resolve) Goal: Pt Will Go Up/Down Stairs And no railing

## 2015-02-06 ENCOUNTER — Ambulatory Visit: Payer: Medicaid Other | Attending: Rheumatology | Admitting: Physical Therapy

## 2015-02-06 DIAGNOSIS — M25511 Pain in right shoulder: Secondary | ICD-10-CM | POA: Diagnosis present

## 2015-02-06 DIAGNOSIS — R531 Weakness: Secondary | ICD-10-CM | POA: Insufficient documentation

## 2015-02-06 DIAGNOSIS — I69359 Hemiplegia and hemiparesis following cerebral infarction affecting unspecified side: Secondary | ICD-10-CM

## 2015-02-06 NOTE — Patient Instructions (Signed)
All exercises provided were adapted from [email or web address]. Patient was provided a written handout with pictures as described. Any additional cues were manually entered in to handout and copied in to this document.  Low Rows  With elbows straight and palms facing forward, pull band back and pinch shoulder blades together down and back.  DO NOT SHRUG your shoulders.   [place]  Tie a knot in the elastic band and close the knot in the door jam. Perform this exercise with the elastic band at elbow level.  Hold an elastic band with both arms in front of you. Next, pull the band back towards your side as you bend your elbows.     ELASTIC BAND BILATERAL EXTERNAL ROTATION  While holding an elastic band with your elbows bent, pull your hands away from your stomach area. Keep  your elbows near the side of your body.

## 2015-02-06 NOTE — Therapy (Signed)
[facility] [address], [place], [address] Phone: [phone]   Fax:  [phone]  Physical Therapy Evaluation  Patient Details  Name: Kelli Gutierrez MRN: [id number] Date of Birth: December 04, 1950 No Data Recorded  Encounter Date: 02/06/2015      PT End of Session - 02/06/15 1511    Visit Number 1   Number of Visits 9   Date for PT Re-Evaluation 03/13/15   Authorization Type Medicaid 1    PT Start Time 1350   PT Stop Time 1450   PT Time Calculation (min) 60 min   Activity Tolerance Patient tolerated treatment well   Behavior During Therapy [facility] for tasks assessed/performed  Tearful initially      Past Medical History  Diagnosis Date  . Diabetes mellitus without complication   . Hypertension   . Rheumatoid arthritis   . Stroke     Past Surgical History  Procedure Laterality Date  . Middle ear surgery      There were no vitals filed for this visit.  Visit Diagnosis:  Generalized weakness - Plan: PT plan of care cert/re-cert  Right shoulder pain - Plan: PT plan of care cert/re-cert  Hemiplegia affecting dominant side, post-stroke [facility]) - Plan: PT plan of care cert/re-cert      Subjective Assessment - 02/06/15 1357    Subjective Patient reports she has been disabled since 1992, she has DM2 and RA. She had a stroke in 1999 impacting her L side, on December 17, 2014 she had a CVA impacting her R side. She feels weakness and heaviness on her R side. She has difficulty holding and lifting objects as well as performing ADLs such as hygeine.    Limitations Lifting;House hold activities   Patient Stated Goals Be able to complete ADLs and IADLs   Currently in Pain? Yes   Pain Score 7    Pain Location Shoulder   Pain Orientation Right   Pain Descriptors / Indicators Aching   Pain Onset More than a month ago   Pain Frequency Intermittent   Aggravating Factors  Repetitive overhead movements.    Pain  Relieving Factors "It comes and goes"            [facility] PT Assessment - 02/06/15 0001    Assessment   Medical Diagnosis --  Chronic pain in R shoulder   Onset Date/Surgical Date 12/17/14   Hand Dominance --  R    Precautions   Precautions None   Restrictions   Weight Bearing Restrictions No   Balance Screen   Has the patient fallen in the past 6 months No   Has the patient had a decrease in activity level because of a fear of falling?  Yes   Home Environment   Living Environment Private residence   Living Arrangements --  Lives with handicapped son   Prior Function   Level of Independence Independent   Vocation --  Disabled   Cognition   Overall Cognitive Status Within Functional Limits for tasks assessed   Behaviors --  Becomes tearful recounting her social struggles as a result   Observation/Other Assessments   Quick DASH  70.8%   Posture/Postural Control   Posture Comments --  R shoulder noted to be elevated relative to LUE   AROM   Overall AROM Comments --  Patient able to touch the back of her head with RUE, shrugs   PROM   Overall PROM Comments --  Full ER, IR limited to ~1/3 of WNL, Flexion WNL and no pain   Strength   Overall Strength Comments --  IR/ER roughly 4/5, flexion 4/5 on R UE   Right Elbow Flexion 5/5   Right Elbow Extension 5/5   Left Elbow Flexion 5/5   Left Elbow Extension 5/5   Belly Press   Findings Negative   Full Can test   Findings Negative   Painful Arc of Motion   Findings --  Appeared to be positive initially on R   Sulcus Sign At 0 Degrees   Findings --  No appreciable difference side to side   Ambulation/Gait   Assistive device --  None used      Educated patient on not shrugging her UE, she was able to complete 2 sets x 10 repetitions of shoulder flexion to touch her hair with no increase in pain   Mid rows with yellow t-band 2 sets x 10 repetitions with significant cuing for scapular retraction not humeral  extension  Low rows 1 set x 10 repetitions with significant cuing for LT activation  Bilateral ER with yellow t-band x 10 with cuing for keeping her elbows near her ribs.                      PT Education - 02/06/15 1508    Education provided Yes   Education Details Educated patient on not shrugging with overhead moving, HEP with technique, strengthening program.    Person(s) Educated Patient   Methods Explanation;Demonstration;Tactile cues;Verbal cues;Handout   Comprehension Need further instruction;Tactile cues required;Returned demonstration;Verbal cues required;Verbalized understanding             PT Long Term Goals - 02/06/15 1516    PT LONG TERM GOAL #1   Title Patient will be independent with a home exercise program to increase her RUE strength to increase her ability to complete ADLs.    Status New   PT LONG TERM GOAL #2   Title Patient will report a QuickDash score of less than 60% disability to demonstrate increased ability to complete ADLs.    Baseline 70.8%   Status New   PT LONG TERM GOAL #3   Title Patient will complete full AROM with RUE and no increase in pain to demonstrate ability to complete ADLs.   Baseline Increased pain with AROM   Status New   PT LONG TERM GOAL #4   Title Patient will report a worst pain score of < 3/10 to demonstrate improved tolerance for ADLs.    Baseline 5/10   Status New               Plan - 02/06/15 1512    Clinical Impression Statement Patient demonstrates RUE weakness, paritcularly in her RTC musculature altering her RUE movement pattern. As a result she is experiencing pain in her RUE and generalized weakness and decreased sensation on her RUE/RLE. She has recently experienced her 2nd CVA, this impacting her dominant R side. Patient displayed significantly decreased pain symptoms with cuing for not shrugging her shoulder. Patient would benefit from skilled PT services to address her pain symptoms  resulting from her CVA.    Pt will benefit from skilled therapeutic intervention in order to improve on the following deficits Pain;Decreased strength;Impaired UE functional use   Rehab Potential Good   Clinical Impairments Affecting Rehab Potential Family history of CVAs   PT Frequency 2x / week   PT Duration 4 weeks  PT Treatment/Interventions ADLs/Self Care Home Management;Electrical Stimulation;Therapeutic activities;Therapeutic exercise;Manual techniques   PT Next Visit Plan Progress strengthening program as tolerated.    PT Home Exercise Plan See patient instructions    Recommended Other Services Occupational therapy    Consulted and Agree with Plan of Care Patient         Problem List Patient Active Problem List   Diagnosis Date Noted  . Acute CVA (cerebrovascular accident) ([place]) 12/17/2014  . CVA (cerebral infarction) 12/17/2014   Kerman Passey, PT, DPT    02/06/2015, 3:48 PM  [facility] [address], [place], [address] Phone: [phone]   Fax:  [phone]  Name: LOUDELL HIRANI MRN: [id number] Date of Birth: [birth year]

## 2015-04-02 ENCOUNTER — Other Ambulatory Visit: Payer: Self-pay | Admitting: Neurology

## 2015-04-02 DIAGNOSIS — Z8673 Personal history of transient ischemic attack (TIA), and cerebral infarction without residual deficits: Secondary | ICD-10-CM | POA: Insufficient documentation

## 2015-04-28 ENCOUNTER — Ambulatory Visit
Admission: RE | Admit: 2015-04-28 | Discharge: 2015-04-28 | Disposition: A | Discharge status: Home / Self Care | Payer: Medicaid Other | Source: Ambulatory Visit | Attending: Neurology | Admitting: Neurology

## 2015-04-28 DIAGNOSIS — Z8673 Personal history of transient ischemic attack (TIA), and cerebral infarction without residual deficits: Secondary | ICD-10-CM | POA: Insufficient documentation

## 2015-04-28 DIAGNOSIS — I739 Peripheral vascular disease, unspecified: Secondary | ICD-10-CM | POA: Diagnosis not present

## 2015-05-29 ENCOUNTER — Ambulatory Visit: Payer: Medicare Other | Attending: Family Medicine

## 2015-05-29 VITALS — BP 138/58

## 2015-05-29 DIAGNOSIS — R531 Weakness: Secondary | ICD-10-CM

## 2015-05-29 DIAGNOSIS — R2681 Unsteadiness on feet: Secondary | ICD-10-CM | POA: Insufficient documentation

## 2015-05-29 NOTE — Therapy (Signed)
[place] MAIN [facility] SERVICES [address] [place], [place], [address] Phone: [phone]   Fax:  [phone]  Physical Therapy Evaluation  Patient Details  Name: Kelli Gutierrez MRN: [id number] Date of Birth: 01/30/1951 Referring Provider: Dr. Gwynneth Aliment  Encounter Date: 05/29/2015      PT End of Session - 05/29/15 1632    Visit Number 1   Number of Visits 17   Date for PT Re-Evaluation 06/26/15   Authorization Type 1/10   PT Start Time 1520   PT Stop Time 1615   PT Time Calculation (min) 55 min   Activity Tolerance Patient tolerated treatment well   Behavior During Therapy [facility] for tasks assessed/performed  Tearful initially      Past Medical History  Diagnosis Date  . Diabetes mellitus without complication ([place])   . Hypertension   . Rheumatoid arthritis ([place])   . Stroke ([place])   . Chronic kidney disease 12/2015    Past Surgical History  Procedure Laterality Date  . Middle ear surgery      Filed Vitals:   05/29/15 1540  BP: 138/58    Visit Diagnosis:  Unsteadiness on feet - Plan: PT plan of care cert/re-cert  Weakness generalized - Plan: PT plan of care cert/re-cert      Subjective Assessment - 05/29/15 1540    Subjective Pt had 1 stroke affecting her L side in 1999. pt then had a second stroke 12/17/15 where she had significant R sided weakness. pt reports she was in the hospital for 5 days then sent home. she had HHPT x 3 weeks. she reports her R side still feels numb,  and very heavy and weak. pt reports she is much slower walking and her balance is off. she hasnt had any falls. she is currently anemic , and is undergoing treatment next week. pt also releates issues related to the RUE and hand.    Pertinent History CVA 1999, CVA 12/2014, DM, HTN, last hemoglobin 8.1   How long can you stand comfortably? 5 min   How long can you walk comfortably? 5 min   Diagnostic tests MRI in hospital   Patient Stated Goals walk faster, get  stronger    Currently in Pain? Yes   Pain Score 6    Pain Location Shoulder   Pain Orientation Right            [facility] PT Assessment - 05/29/15 0001    Assessment   Medical Diagnosis R sided weakness due to CVA   Referring Provider Dr. Gwynneth Aliment   Onset Date/Surgical Date 12/17/14   Hand Dominance Right   Precautions   Precautions Fall   Restrictions   Weight Bearing Restrictions No   Balance Screen   Has the patient fallen in the past 6 months No   Has the patient had a decrease in activity level because of a fear of falling?  Yes   Is the patient reluctant to leave their home because of a fear of falling?  No   Home Environment   Living Environment Private residence   Living Arrangements Children  mentally handicapped son   Available Help at Discharge Other (Comment)  pt refused   Type of [place] Access Level entry   Home Layout One level   [place] - 2 wheels;Grab bars - tub/shower   Prior Function   Level of [place];Independent with basic ADLs;Independent with household mobility without device  able to  drive, walk 3 miles, stand >1hr   Vocation On disability   Standardized Balance Assessment   Standardized Balance Assessment Berg Balance Test;Five Times Sit to Stand;10 meter walk test;Timed Up and Go Test   Five times sit to stand comments  21.25   10 Meter Walk 0.56   Timed Up and Go Test   Normal TUG (seconds) 19.5        POSTURE/OBSERVATION: Pt presents with R facial droop, no acute distress, rounded shoulders  PROM/AROM:  Pt bilateral shoulder flexion/abduction limited to 90 deg and painful.  Elbow hand and wrist AROM WNL, Pt LE AROM is WNL bilaterally STRENGTH:  Graded on a 0-5 scale Muscle Group Left Right  Shoulder flex 4- 4-  Shoulder Abd 4- 4-  Shoulder Ext    Shoulder IR/ER 3 3  Elbow 4 4  Wrist/hand  4 4  Hip Flex 4 3+  Hip Abd 4- 3+  Hip Add 4- 3  Hip Ext 4 4-  Hip IR/ER    Knee Flex 4- 3+   Knee Ext 4 3+  Ankle DF 4 4-  Ankle PF 3 3   SENSATION: Light touch: reduced to the lower RLE, hyper sensitive to the R upper leg Deep pressure, impaired to the RLE Pt reports impaired temperature sensation to the RLE  SPECIAL TESTS: + mild sulcus sign to the shoulders  FUNCTIONAL MOBILITY: Pt is modified independent for all bed mobility and transfers requiring increased time.   BALANCE Fair: to perform berg next visit  GAIT: Pt ambulates with reduced step length bilaterally, and shuffled gait reduced heel/toe transfer, semi-crouched position.   Finger to nose test: mild dyskinesis on the R Heel shin WNL bilaterally (-) clonus bilaterally Reflexes: 1+ patellar and achilles bilaterally H visual tracking WNL Peripheral vision WNL                        PT Education - 05/29/15 1630    Education provided Yes   Education Details plan of care, exam findings   Person(s) Educated Patient   Methods Explanation   Comprehension Verbalized understanding             PT Long Term Goals - 05/29/15 1638    PT LONG TERM GOAL #1   Title pt will perform 5x sit to stand in <14s to demonstrate improved LE strength.   Time 8   Period Weeks   Status New   PT LONG TERM GOAL #2   Title pt will increase walking speed with LRAD to >0.19m/s to normalized home mobility   Time 8   Period Weeks   Status New   PT LONG TERM GOAL #3   Title pt will perform TUG in under 12s to reduce fall risk   Time 8   Period Weeks   Status New   PT LONG TERM GOAL #4   Title pt will improve berg balance score by 6pts to reduce fall risk   Time 8   Period Weeks   Status New               Plan - 05/29/15 1632    Clinical Impression Statement pt presents as a 65 y/o F with hx of CVA with R sided hemiparesis 12/17/2014. she reports her entire R side feels numb/ heavy and weak. pt demonstrates weakness of the R side >L side LE>UE. pt demontrates impaired sensation including  light touch, deep pressure and temperature. her proprioception appears  intact. pt would benefit from skilled PT services to address pain, strength, gait, balance to maximize mobility and functional independence. pt would also likely benefit from OT to address UE deficits and ST to evaluate swollowing difficulty.   Pt will benefit from skilled therapeutic intervention in order to improve on the following deficits Pain;Decreased strength;Impaired UE functional use;Abnormal gait;Decreased activity tolerance;Decreased balance;Decreased endurance;Decreased range of motion;Difficulty walking   Rehab Potential Good   Clinical Impairments Affecting Rehab Potential multiple CVAs, anemia, DM, HTN, RA   PT Frequency 2x / week   PT Duration 8 weeks   PT Treatment/Interventions ADLs/Self Care Home Management;Electrical Stimulation;Therapeutic activities;Therapeutic exercise;Manual techniques;Aquatic Therapy;Neuromuscular re-education;Balance training;Functional mobility training;Gait training;Stair training   Consulted and Agree with Plan of Care Patient          G-Codes - 20-Jun-2015 1640    Functional Assessment Tool Used 5xsittostand/ TUG/ 51mwalk   Functional Limitation Mobility: Walking and moving around   Mobility: Walking and Moving Around Current Status [id number]) At least 40 percent but less than 60 percent impaired, limited or restricted   Mobility: Walking and Moving Around Goal Status [id number]) At least 1 percent but less than 20 percent impaired, limited or restricted       Problem List Patient Active Problem List   Diagnosis Date Noted  . Acute CVA (cerebrovascular accident) ([place]) 12/17/2014  . CVA (cerebral infarction) 12/17/2014   Gorden Harms. Kellar Westberg, PT, DPT [phone]  Uel Davidow 2015-06-20, 4:44 PM  [place] MAIN [facility] SERVICES [address] [place], [place], [address] Phone: [phone]   Fax:  [phone]  Name: Kelli Gutierrez MRN:  [id number] Date of Birth: 1950/07/16

## 2015-06-01 DIAGNOSIS — N644 Mastodynia: Secondary | ICD-10-CM

## 2015-06-01 HISTORY — DX: Mastodynia: N64.4

## 2015-06-18 DIAGNOSIS — D509 Iron deficiency anemia, unspecified: Secondary | ICD-10-CM | POA: Insufficient documentation

## 2015-06-24 ENCOUNTER — Ambulatory Visit: Payer: Medicare Other | Attending: Family Medicine

## 2015-06-24 DIAGNOSIS — R2681 Unsteadiness on feet: Secondary | ICD-10-CM | POA: Insufficient documentation

## 2015-06-24 DIAGNOSIS — R531 Weakness: Secondary | ICD-10-CM | POA: Insufficient documentation

## 2015-06-24 NOTE — Patient Instructions (Signed)
[email or web address] Ankle raises/ heel raises 2x10 Squats 2x10 Standing march 2x10 Hip abduction 2x10 Hip extension 2x10 Standing hamstring curl 2x10 Standing NBOS EO/EC 10s x 10 Standing semi-tandem eyes open 30s x3 each leg All exercises performed bilaterally

## 2015-06-24 NOTE — Therapy (Signed)
[place] MAIN [facility] SERVICES [address] [place], [place], [address] Phone: [phone]   Fax:  [phone]  Physical Therapy Treatment  Patient Details  Name: Kelli Gutierrez MRN: [id number] Date of Birth: 05-04-1950 Referring Provider: Dr. Gwynneth Aliment  Encounter Date: 06/24/2015      PT End of Session - 06/24/15 1543    Visit Number 2   Number of Visits 17   Date for PT Re-Evaluation 06/26/15   Authorization Type 2/10   PT Start Time 1500   PT Stop Time 1543   PT Time Calculation (min) 43 min   Equipment Utilized During Treatment Gait belt   Activity Tolerance Patient tolerated treatment well   Behavior During Therapy [facility] for tasks assessed/performed  Tearful initially      Past Medical History  Diagnosis Date  . Diabetes mellitus without complication ([place])   . Hypertension   . Rheumatoid arthritis ([place])   . Stroke ([place])   . Chronic kidney disease 12/2015    Past Surgical History  Procedure Laterality Date  . Middle ear surgery      There were no vitals filed for this visit.  Visit Diagnosis:  Unsteadiness on feet  Weakness generalized      Subjective Assessment - 06/24/15 1509    Subjective pt has not returned to PT since initial Eval due to insurance reasons. She reports no recent changes in her function. she reports 3 weeks ago she had very low blood sugar where EMS had to come and get it back up. She did not have to go to the hospital    Pertinent History CVA 1999, CVA 12/2014, DM, HTN, last hemoglobin 8.1   How long can you stand comfortably? 5 min   How long can you walk comfortably? 5 min   Diagnostic tests MRI in hospital   Patient Stated Goals walk faster, get stronger    Currently in Pain? Yes   Pain Score 1    Pain Location --  L shoulder        Therex: Nustep  L 2 x 5 min no charge warm up [email or web address] Ankle raises/ heel raises 2x10 Squats 2x10 Standing march 2x10 Hip abduction 2x10 Hip extension  2x10 Standing hamstring curl 2x10 Standing NBOS EO/EC 10s x 10 Standing semi-tandem eyes open 30s x3 each leg All exercises performed bilaterally  Pt requires min verbal and tactile cues for proper exercise performance                               PT Long Term Goals - 06/24/15 1545    PT LONG TERM GOAL #1   Title pt will perform 5x sit to stand in <14s to demonstrate improved LE strength.   Time 8   Period Weeks   Status Deferred   PT LONG TERM GOAL #2   Title pt will increase walking speed with LRAD to >0.59m/s to normalized home mobility   Time 8   Period Weeks   Status Deferred   PT LONG TERM GOAL #3   Title pt will perform TUG in under 12s to reduce fall risk   Time 8   Period Weeks   Status Deferred   PT LONG TERM GOAL #4   Title pt will improve berg balance score by 6pts to reduce fall risk   Time 8   Period Weeks   Status Deferred  Plan - 06/24/15 1543    Clinical Impression Statement pt did well with initiation of HEP exercises. she reports moderate leg fatigue at end of session. pt demonstrates good understanding of HEP and precautions for balance exercises (be done at counter or in corner). pt was seen for follow up today 1 mo post PT eval which was her only appt so far. PT deferred goal testing and outcome measures as she has not been seen since then. pt will be reevaluated in 30 days or 10 visit.    Pt will benefit from skilled therapeutic intervention in order to improve on the following deficits Pain;Decreased strength;Impaired UE functional use;Abnormal gait;Decreased activity tolerance;Decreased balance;Decreased endurance;Decreased range of motion;Difficulty walking   Rehab Potential Good   Clinical Impairments Affecting Rehab Potential multiple CVAs, anemia, DM, HTN, RA   PT Frequency 2x / week   PT Duration 8 weeks   PT Treatment/Interventions ADLs/Self Care Home Management;Electrical Stimulation;Therapeutic  activities;Therapeutic exercise;Manual techniques;Aquatic Therapy;Neuromuscular re-education;Balance training;Functional mobility training;Gait training;Stair training   Consulted and Agree with Plan of Care Patient        Problem List Patient Active Problem List   Diagnosis Date Noted  . Acute CVA (cerebrovascular accident) ([place]) 12/17/2014  . CVA (cerebral infarction) 12/17/2014   Gorden Harms. Ridwan Bondy, PT, DPT [phone]   Heavan Francom 06/24/2015, 3:45 PM  [place] [facility] MAIN [facility] SERVICES [address] [place], [place], [address] Phone: [phone]   Fax:  [phone]  Name: Kelli Gutierrez MRN: [id number] Date of Birth: 07/04/1950

## 2015-06-30 ENCOUNTER — Ambulatory Visit: Payer: Medicaid Other | Admitting: Physical Therapy

## 2015-06-30 DIAGNOSIS — R200 Anesthesia of skin: Secondary | ICD-10-CM | POA: Insufficient documentation

## 2015-07-01 ENCOUNTER — Ambulatory Visit: Payer: Medicare Other | Admitting: Physical Therapy

## 2015-07-02 ENCOUNTER — Ambulatory Visit: Payer: Medicaid Other | Admitting: Physical Therapy

## 2015-07-08 ENCOUNTER — Ambulatory Visit: Payer: Medicare Other

## 2015-07-08 ENCOUNTER — Ambulatory Visit: Payer: Medicaid Other

## 2015-07-10 ENCOUNTER — Ambulatory Visit: Payer: Medicare Other

## 2015-07-10 ENCOUNTER — Ambulatory Visit: Payer: Medicaid Other

## 2015-07-14 ENCOUNTER — Ambulatory Visit: Payer: Medicare Other

## 2015-07-15 ENCOUNTER — Ambulatory Visit: Payer: Medicaid Other

## 2015-07-15 ENCOUNTER — Ambulatory Visit: Payer: Medicare Other

## 2015-07-16 ENCOUNTER — Ambulatory Visit: Payer: Medicare Other

## 2015-07-17 ENCOUNTER — Ambulatory Visit: Payer: Medicaid Other

## 2015-07-21 ENCOUNTER — Ambulatory Visit: Payer: Medicare Other

## 2015-07-22 ENCOUNTER — Ambulatory Visit: Payer: Medicaid Other

## 2015-07-23 ENCOUNTER — Ambulatory Visit: Payer: Medicare Other

## 2015-07-24 ENCOUNTER — Ambulatory Visit: Payer: Medicaid Other

## 2015-07-28 ENCOUNTER — Ambulatory Visit: Payer: Medicare Other

## 2015-07-29 ENCOUNTER — Ambulatory Visit: Payer: Medicaid Other

## 2015-07-30 ENCOUNTER — Ambulatory Visit: Payer: Medicare Other

## 2015-07-31 ENCOUNTER — Ambulatory Visit: Payer: Medicaid Other

## 2015-08-04 ENCOUNTER — Ambulatory Visit: Payer: Medicare Other

## 2015-08-05 ENCOUNTER — Ambulatory Visit: Payer: Medicaid Other

## 2015-08-06 ENCOUNTER — Ambulatory Visit: Payer: Medicare Other

## 2015-08-07 ENCOUNTER — Ambulatory Visit: Payer: Medicaid Other

## 2015-08-11 ENCOUNTER — Ambulatory Visit: Payer: Medicare Other

## 2015-08-12 ENCOUNTER — Ambulatory Visit: Payer: Medicaid Other

## 2015-08-13 ENCOUNTER — Ambulatory Visit: Payer: Medicare Other

## 2015-08-13 ENCOUNTER — Ambulatory Visit: Admit: 2015-08-13 | Payer: Medicare Other | Admitting: Unknown Physician Specialty

## 2015-08-13 SURGERY — COLONOSCOPY WITH PROPOFOL
Anesthesia: General

## 2015-08-14 ENCOUNTER — Ambulatory Visit: Payer: Medicaid Other

## 2015-08-19 ENCOUNTER — Ambulatory Visit: Payer: Medicaid Other

## 2015-09-01 ENCOUNTER — Other Ambulatory Visit: Payer: Self-pay | Admitting: Physician Assistant

## 2015-09-01 DIAGNOSIS — R5383 Other fatigue: Secondary | ICD-10-CM

## 2015-09-01 DIAGNOSIS — D508 Other iron deficiency anemias: Secondary | ICD-10-CM

## 2015-09-01 DIAGNOSIS — R1084 Generalized abdominal pain: Secondary | ICD-10-CM

## 2015-09-01 DIAGNOSIS — R0789 Other chest pain: Secondary | ICD-10-CM

## 2015-09-05 ENCOUNTER — Ambulatory Visit
Admission: RE | Admit: 2015-09-05 | Discharge: 2015-09-05 | Disposition: A | Discharge status: Home / Self Care | Payer: Medicare Other | Source: Ambulatory Visit | Attending: Physician Assistant | Admitting: Physician Assistant

## 2015-09-05 DIAGNOSIS — R0789 Other chest pain: Secondary | ICD-10-CM | POA: Diagnosis not present

## 2015-09-05 DIAGNOSIS — R5383 Other fatigue: Secondary | ICD-10-CM | POA: Insufficient documentation

## 2015-09-05 DIAGNOSIS — R1084 Generalized abdominal pain: Secondary | ICD-10-CM | POA: Diagnosis not present

## 2015-09-05 DIAGNOSIS — D508 Other iron deficiency anemias: Secondary | ICD-10-CM | POA: Diagnosis present

## 2015-09-18 ENCOUNTER — Other Ambulatory Visit: Payer: Self-pay | Admitting: Family Medicine

## 2015-09-18 DIAGNOSIS — Z1239 Encounter for other screening for malignant neoplasm of breast: Secondary | ICD-10-CM

## 2015-09-29 ENCOUNTER — Other Ambulatory Visit: Payer: Self-pay | Admitting: Family Medicine

## 2015-09-29 DIAGNOSIS — Z1231 Encounter for screening mammogram for malignant neoplasm of breast: Secondary | ICD-10-CM

## 2015-09-30 DIAGNOSIS — I10 Essential (primary) hypertension: Secondary | ICD-10-CM | POA: Insufficient documentation

## 2015-09-30 DIAGNOSIS — I152 Hypertension secondary to endocrine disorders: Secondary | ICD-10-CM | POA: Insufficient documentation

## 2015-10-09 ENCOUNTER — Ambulatory Visit
Admission: RE | Admit: 2015-10-09 | Discharge: 2015-10-09 | Disposition: A | Discharge status: Home / Self Care | Payer: Medicare Other | Source: Ambulatory Visit | Attending: Family Medicine | Admitting: Family Medicine

## 2015-10-09 ENCOUNTER — Other Ambulatory Visit: Payer: Self-pay | Admitting: Family Medicine

## 2015-10-09 DIAGNOSIS — Z1231 Encounter for screening mammogram for malignant neoplasm of breast: Secondary | ICD-10-CM | POA: Diagnosis not present

## 2015-10-15 ENCOUNTER — Encounter: Admission: RE | Payer: Self-pay | Source: Ambulatory Visit

## 2015-10-15 ENCOUNTER — Ambulatory Visit
Admission: RE | Admit: 2015-10-15 | Payer: Medicare Other | Source: Ambulatory Visit | Admitting: Unknown Physician Specialty

## 2015-10-15 SURGERY — ESOPHAGOGASTRODUODENOSCOPY (EGD) WITH PROPOFOL
Anesthesia: General

## 2015-11-20 ENCOUNTER — Ambulatory Visit: Payer: Medicare Other | Attending: Neurology

## 2015-11-20 DIAGNOSIS — R293 Abnormal posture: Secondary | ICD-10-CM | POA: Insufficient documentation

## 2015-11-20 DIAGNOSIS — M25511 Pain in right shoulder: Secondary | ICD-10-CM | POA: Insufficient documentation

## 2015-11-20 DIAGNOSIS — M25512 Pain in left shoulder: Secondary | ICD-10-CM | POA: Insufficient documentation

## 2015-11-20 NOTE — Patient Instructions (Signed)
[email or web address] Lumbar roll when sitting Biceps stretch in doorway, 3x30 sec Towel slides on wall, flexion and abduction, 2x10 scap retraction and low rows with RTB, 2x10 each 1x/day

## 2015-11-20 NOTE — Therapy (Signed)
[place] MAIN [facility] SERVICES [address] [place], [place], [address] Phone: [phone]   Fax:  [phone]  Physical Therapy Evaluation  Patient Details  Name: Kelli Gutierrez MRN: [id number] Date of Birth: [birth year] Referring Provider: Melrose Nakayama  Encounter Date: 11/20/2015      PT End of Session - 11/20/15 1115    Visit Number 1   Number of Visits 13   Date for PT Re-Evaluation 12/18/15   Authorization Type 1/10   PT Start Time 0850   PT Stop Time 0943   PT Time Calculation (min) 53 min      Past Medical History:  Diagnosis Date  . Chronic kidney disease 12/2015  . Diabetes mellitus without complication ([place])   . Hypertension   . Rheumatoid arthritis ([place])   . Stroke [facility])     Past Surgical History:  Procedure Laterality Date  . MIDDLE EAR SURGERY      There were no vitals filed for this visit.       Subjective Assessment - 11/20/15 1112    Subjective Pt reports she has bil shoulder pain, R>L, which has progressively worsened since February 2017. She reports it hurts to do almost anything with her shoulders. She reports she would not be able to get a gallon of milk out of the fridge. She reports difficulty dressing due to the shoulder pain. She reports n/t on RUE, which is baseline due to previous stroke. She reports that when her R arm hurts, the pain will radiate up to her neck. She denies any neck movements that aggravate her pain. She denies any relieving factors. She reports she has difficulty sleeping due to the pain and reports that she wakes up multiple times in a night. She reports being able to perform light household chores but unable to perform heavier household chores due to the pain. She is unable to pinpoint her pain on either shoulder, stating it is just a global pain. She is currently performing table slides and scap retractions at home.   Limitations Lifting;House hold activities   Patient Stated Goals decrease pain    Currently in Pain? Yes   Pain Score 7    Pain Location Shoulder   Pain Orientation Right;Left   Pain Descriptors / Indicators Constant   Pain Onset More than a month ago   Aggravating Factors  actively moving, lifting   Pain Relieving Factors none   Effect of Pain on Daily Activities unable to perform heavy household chores, affects sleep            [facility] PT Assessment - 11/20/15 0001      Assessment   Medical Diagnosis bil shoulder pain, R arm numbness   Referring Provider [place]   Onset Date/Surgical Date 09/20/15   Hand Dominance Right   Prior Therapy yes: CVA, balance     Precautions   Precautions None     Restrictions   Weight Bearing Restrictions No     Balance Screen   Has the patient fallen in the past 6 months No   Has the patient had a decrease in activity level because of a fear of falling?  Yes   Is the patient reluctant to leave their home because of a fear of falling?  No     Home Environment   Living Environment Private residence   Living Arrangements Other relatives   Available Help at Discharge Family   Type of [place] One level  Home Equipment None     Prior Function   Level of Independence Independent with basic ADLs   Vocation On disability     Cognition   Overall Cognitive Status Within Functional Limits for tasks assessed           PT Education - 11/20/15 1115    Education provided Yes   Education Details POC, exam findings, HEP   Person(s) Educated Patient   Methods Explanation   Comprehension Verbalized understanding      DERMATOMES LUE WNL RUE: Light touch diminished C5-T1   AROM Left Right  Shoulder flexion 67 pain 79 pain  Shoulder abduction 75 pain 66 pain   PROM: Left Right  Shoulder flexion 90, pain at end range, normal end feel 125 pain at end range, normal end feel  Shoulder abduction 100 pain at end range, tight end feel 90 pain at end range, tight end feel  Shoulder IR/ER WNL each,  non-painful ER: 30 deg, pain at end range and tight end feel IR: WNL, non-painful   Cervical AROM WNL, non-painful Elbow and wrist AROM WNL  STRENGTH (on scale of 0-5/5)  Left Right  Shoulder flexion 3+ 4-  Shoulder extension    Shoulder abduction 3+ 4-  Shoulder IR/ER 4-/4- 4-/4-  Elbow flexion 4+ 4+  Elbow extension 4+ 4+  Wrist flexion 5 5  Wrist extension 5 5  Middle trap 3 2+  Lower trap 3 2+   PALPATION/OBSERVATION Bil posterior and inferior GH joint hypomobile with tight end feel for inferior glides Hypomobile thoracic spine with CPAs, non-painful Bil scapulae hypomobile throughout Increased soft tissue restrictions of bil delt, biceps, bicpes long head tendon, UT; R infraspinatus Tenderness to palpation of bil biceps long head tendon, deltoid; R infraspinatus  CERVICAL SPINE No increase in shoulder symptoms with cervical ROM  POSTURE Increased thoracic kyphosis, forward head posture, rounded shoulders  SPECIAL TESTS Neer's: + BUE with flexion and abduction        PT Long Term Goals - 11/20/15 1254      PT LONG TERM GOAL #1   Title pt will be independent with HEP to promote recovery and decrease reinjury.   Time 6   Period Weeks   Status New     PT LONG TERM GOAL #2   Title pt will be able to sleep without waking up more than 2 times because of the pain.   Baseline wakes up multiple times a night   Time 6   Period Weeks   Status New     PT LONG TERM GOAL #3   Title pt will have improved bil shoulder flexion and abduction AROM to 150 deg to improve overall function with household chores.   Baseline R flex: 79 deg, abd: 66 deg;  L flex: 67 deg, abd: 75 deg   Time 6   Period Weeks   Status New               Plan - 11/20/15 1250    Clinical Impression Statement Pt is 65 YO F who presents to therapy with c/o bil shoulder pain. She has deficits in A/PROM (due to pain), strength (due to pain), GH joint mobility, thoracic spine mobility, posture,  pain, and soft tissue restrictions. Pt had +Neer's test bil indicating scapular stabilzation muscles are weak. Pt has history of CVAs (x2) and has baseline n/t in RUE. Pt has difficulty sleeping and performing other household activities due to the pain. Pt needs skilled PT intervention  to maximize overall function and decrease pain.   Rehab Potential Fair   Clinical Impairments Affecting Rehab Potential potential difficulty with motivation; prior strokes, pain   PT Frequency 2x / week   PT Duration 6 weeks   PT Treatment/Interventions ADLs/Self Care Home Management;Electrical Stimulation;Moist Heat;Functional mobility training;Therapeutic activities;Therapeutic exercise;Neuromuscular re-education;Patient/family education;Manual techniques;Passive range of motion;Dry needling   PT Next Visit Plan manual, strengthening, review HEP   Consulted and Agree with Plan of Care Patient      Patient will benefit from skilled therapeutic intervention in order to improve the following deficits and impairments:  Decreased activity tolerance, Decreased mobility, Decreased range of motion, Decreased strength, Hypomobility, Increased muscle spasms, Impaired flexibility, Postural dysfunction, Improper body mechanics, Pain  Visit Diagnosis: Pain in right shoulder - Plan: PT plan of care cert/re-cert  Pain in left shoulder - Plan: PT plan of care cert/re-cert  Abnormal posture - Plan: PT plan of care cert/re-cert      G-Codes - [id number]    Functional Assessment Tool Used quickdash, ROM   Functional Limitation Carrying, moving and handling objects   Carrying, Moving and Handling Objects Current Status [id number]) At least 40 percent but less than 60 percent impaired, limited or restricted   Carrying, Moving and Handling Objects Goal Status [id number]) At least 1 percent but less than 20 percent impaired, limited or restricted       Problem List Patient Active Problem List   Diagnosis Date Noted  . Acute  CVA (cerebrovascular accident) ([place]) 12/17/2014  . CVA (cerebral infarction) 12/17/2014   Geraldine Solar, SPT This entire session was performed under direct supervision and direction of a licensed therapist/therapist assistant . I have personally read, edited and approve of the note as written. Gorden Harms. Tortorici, PT, DPT [phone]  Tortorici,Ashley 11/20/2015, 5:07 PM  [place] MAIN [facility] SERVICES [address] [place], [place], [address] Phone: [phone]   Fax:  [phone]  Name: Kelli Gutierrez MRN: [id number] Date of Birth: 09-13-1950

## 2015-11-25 ENCOUNTER — Ambulatory Visit: Payer: Medicare Other

## 2015-11-27 ENCOUNTER — Ambulatory Visit: Payer: Medicare Other

## 2015-11-27 DIAGNOSIS — R293 Abnormal posture: Secondary | ICD-10-CM

## 2015-11-27 DIAGNOSIS — M25512 Pain in left shoulder: Secondary | ICD-10-CM

## 2015-11-27 DIAGNOSIS — M25511 Pain in right shoulder: Secondary | ICD-10-CM

## 2015-11-27 NOTE — Therapy (Signed)
[place] MAIN [facility] SERVICES [address] [place], [place], [address] Phone: [phone]   Fax:  [phone]  Physical Therapy Treatment  Patient Details  Name: Kelli Gutierrez MRN: [id number] Date of Birth: Aug 14, 1950 Referring Provider: Melrose Nakayama  Encounter Date: 11/27/2015      PT End of Session - 11/27/15 1218    Visit Number 2   Number of Visits 13   Date for PT Re-Evaluation 12/18/15   Authorization Type 2/10   PT Start Time 0930   PT Stop Time 1012   PT Time Calculation (min) 42 min      Past Medical History:  Diagnosis Date  . Chronic kidney disease 12/2015  . Diabetes mellitus without complication ([place])   . Hypertension   . Rheumatoid arthritis ([place])   . Stroke [facility])     Past Surgical History:  Procedure Laterality Date  . MIDDLE EAR SURGERY      There were no vitals filed for this visit.      Subjective Assessment - 11/27/15 1217    Subjective Pt states her shoulders are still painful, intermittently reaching 7/10 pain.   Limitations Lifting;House hold activities   Patient Stated Goals decrease pain   Currently in Pain? Yes   Pain Score 7    Pain Location Shoulder   Pain Orientation Right;Left   Pain Onset More than a month ago     Manual:  Bil shoulder inferior and posterior joint mobs, 3x30-45 sec bouts each direction; pt reported decreased pain following treatment Bil MWM shoulder flexion, abd, 3x20 sec bouts each direction; LUE more limited and painful than RUE; pt had improved AROM following bouts bil  Therex: Bil sidelying shoulder flexion and abduction with scapular assist, 1x10 each; pt reported decreased pain with movement and stated she had decreased pain at rest following treatment      PT Education - 11/27/15 1217    Education provided Yes   Education Details continue HEP as tolerated   Person(s) Educated Patient   Methods Explanation   Comprehension Verbalized understanding              PT Long Term Goals - 11/20/15 1254      PT LONG TERM GOAL #1   Title pt will be independent with HEP to promote recovery and decrease reinjury.   Time 6   Period Weeks   Status New     PT LONG TERM GOAL #2   Title pt will be able to sleep without waking up more than 2 times because of the pain.   Baseline wakes up multiple times a night   Time 6   Period Weeks   Status New     PT LONG TERM GOAL #3   Title pt will have improved bil shoulder flexion and abduction AROM to 150 deg to improve overall function with household chores.   Baseline R flex: 79 deg, abd: 66 deg;  L flex: 67 deg, abd: 75 deg   Time 6   Period Weeks   Status New               Plan - 11/27/15 1218    Clinical Impression Statement Pt presented to therapy this date with continued bil shoulder pain, L > R, and continues with hypomobility with joint mobs. Pt tolerated manual therapy well and reported feeling "lighter" following treatment. Pt demonstrated improved AROM and PROM following manual treatment. Pt scapular kinematics impaired and her BUE elevation in sidelying  was improved with scapular assistance. Pt had no pain following treatment session,   Rehab Potential Fair   Clinical Impairments Affecting Rehab Potential potential difficulty with motivation; prior strokes, pain   PT Frequency 2x / week   PT Duration 6 weeks   PT Treatment/Interventions ADLs/Self Care Home Management;Electrical Stimulation;Moist Heat;Functional mobility training;Therapeutic activities;Therapeutic exercise;Neuromuscular re-education;Patient/family education;Manual techniques;Passive range of motion;Dry needling   PT Next Visit Plan manual, strengthening, review HEP   Consulted and Agree with Plan of Care Patient      Patient will benefit from skilled therapeutic intervention in order to improve the following deficits and impairments:  Decreased activity tolerance, Decreased mobility, Decreased range of motion, Decreased  strength, Hypomobility, Increased muscle spasms, Impaired flexibility, Postural dysfunction, Improper body mechanics, Pain  Visit Diagnosis: Pain in right shoulder  Pain in left shoulder  Abnormal posture     Problem List Patient Active Problem List   Diagnosis Date Noted  . Acute CVA (cerebrovascular accident) ([place]) 12/17/2014  . CVA (cerebral infarction) 12/17/2014   Geraldine Solar, SPT This entire session was performed under direct supervision and direction of a licensed therapist/therapist assistant . I have personally read, edited and approve of the note as written. Gorden Harms. Tortorici, PT, DPT [phone]  Tortorici,Ashley 11/27/2015, 2:15 PM  [place] MAIN [facility] SERVICES [address] [place], [place], [address] Phone: [phone]   Fax:  [phone]  Name: Kelli Gutierrez MRN: [id number] Date of Birth: 1950-05-12

## 2015-12-02 ENCOUNTER — Ambulatory Visit: Payer: Medicare Other

## 2015-12-02 DIAGNOSIS — M25511 Pain in right shoulder: Secondary | ICD-10-CM | POA: Diagnosis not present

## 2015-12-02 DIAGNOSIS — R293 Abnormal posture: Secondary | ICD-10-CM

## 2015-12-02 DIAGNOSIS — G90522 Complex regional pain syndrome I of left lower limb: Secondary | ICD-10-CM | POA: Insufficient documentation

## 2015-12-02 DIAGNOSIS — M25512 Pain in left shoulder: Secondary | ICD-10-CM

## 2015-12-02 NOTE — Therapy (Signed)
[place] MAIN [facility] SERVICES [address] [place], [place], [address] Phone: [phone]   Fax:  [phone]  Physical Therapy Treatment  Patient Details  Name: Kelli Gutierrez MRN: [id number] Date of Birth: September 23, 1950 Referring Provider: Melrose Nakayama  Encounter Date: 12/02/2015      PT End of Session - 12/02/15 0951    Visit Number 3   Number of Visits 13   Date for PT Re-Evaluation 01/01/16   Authorization Type 3/10   PT Start Time 0850   PT Stop Time 0930   PT Time Calculation (min) 40 min   Activity Tolerance Patient tolerated treatment well;Patient limited by pain   Behavior During Therapy [facility] for tasks assessed/performed      Past Medical History:  Diagnosis Date  . Chronic kidney disease 12/2015  . Diabetes mellitus without complication ([place])   . Hypertension   . Rheumatoid arthritis ([place])   . Stroke [facility])     Past Surgical History:  Procedure Laterality Date  . MIDDLE EAR SURGERY      Vitals:   12/02/15 0859  BP: (!) (P) 132/48  Pulse: (P) 79  SpO2: (P) 99%        Subjective Assessment - 12/02/15 0943    Subjective Pt reports her pain as the same as previous session with inconsistent 7/10 pain at the worst regardless of position.  She c/o pain with overhead activities reaching to top cabinet in home and is unable to don straps of bra.  She has attempted ice with no pain relief and still awakens at night, specifically due to R shoulder pain.  She has been completing her HEP 1x/day.   Pertinent History CVA 1999, CVA 12/2014, DM, HTN, last hemoglobin 8.1   Limitations Lifting;House hold activities   How long can you stand comfortably? 5 min   How long can you walk comfortably? 5 min   Diagnostic tests MRI in hospital   Patient Stated Goals decrease pain   Currently in Pain? No/denies       TREATMENT Manual:  Bil shoulder inferior and posterior joint mobs, 3x30-45 sec bouts each direction in 90 deg abduction; pt reported  decreased pain following treatment Bil MWM shoulder flexion, 3x10; RUE more limited and painful than LUE; pt had improved AROM following bouts bil   Therex: Bil sidelying shoulder flexion 1x10 scapular resistance throughout range; pt stated she had decreased pain at rest following treatment        PT Education - 12/02/15 0948    Education provided Yes   Education Details Encouraged pt to continue with HEP as given and instructed to not carry purse on her R shoulder but rather on her forearm (pt reports dec pain with this).  Spoke with pt about having a family member to install grab bars in her tub/shower unit to assist with transfer and to prevent future fall.   Person(s) Educated Patient   Methods Explanation;Demonstration;Verbal cues   Comprehension Verbalized understanding;Returned demonstration             PT Long Term Goals - 12/02/15 0957      PT LONG TERM GOAL #1   Title pt will be independent with HEP to promote recovery and decrease reinjury.   Time 6   Period Weeks   Status New     PT LONG TERM GOAL #2   Title pt will be able to sleep without waking up more than 2 times because of the pain.   Baseline  wakes up multiple times a night   Time 6   Period Weeks   Status New     PT LONG TERM GOAL #3   Title pt will have improved bil shoulder flexion and abduction AROM to 150 deg to improve overall function with household chores.   Baseline R flex: 79 deg, abd: 66 deg;  L flex: 67 deg, abd: 75 deg   Time 6   Period Weeks   Status New               Plan - 12/02/15 [id number]    Clinical Impression Statement Pt tolerated Bil shoulder/scapular mobilizatoins, MWM, and resisted scapular movements into flexion with min pain R>L.  She continues to reports decresaed pain and "lightness" following treatement session and pt was encouraged to continue with HEP as given, including completing HEP this afternoon to accompany gains made this session.  Will continue with  mobilizations and will advance to functional resisted exercises to pts tolerance.   Rehab Potential Fair   Clinical Impairments Affecting Rehab Potential potential difficulty with motivation; prior strokes, pain   PT Frequency 2x / week   PT Duration 6 weeks   PT Treatment/Interventions ADLs/Self Care Home Management;Electrical Stimulation;Moist Heat;Functional mobility training;Therapeutic activities;Therapeutic exercise;Neuromuscular re-education;Patient/family education;Manual techniques;Passive range of motion;Dry needling   PT Next Visit Plan manual, strengthening, review HEP   Consulted and Agree with Plan of Care Patient      Patient will benefit from skilled therapeutic intervention in order to improve the following deficits and impairments:  Decreased activity tolerance, Decreased mobility, Decreased range of motion, Decreased strength, Hypomobility, Increased muscle spasms, Impaired flexibility, Postural dysfunction, Improper body mechanics, Pain  Visit Diagnosis: Pain in right shoulder  Pain in left shoulder  Abnormal posture     Problem List Patient Active Problem List   Diagnosis Date Noted  . Acute CVA (cerebrovascular accident) ([place]) 12/17/2014  . CVA (cerebral infarction) 12/17/2014   Phillips Grout PT, DPT   Collie Siad PT, DPT  12/02/2015, 12:29 PM  [place] MAIN [facility] SERVICES [address] [place], [place], [address] Phone: [phone]   Fax:  [phone]  Name: Kelli Gutierrez MRN: [id number] Date of Birth: 06/04/1950

## 2015-12-04 ENCOUNTER — Ambulatory Visit: Payer: Medicare Other

## 2015-12-04 VITALS — BP 136/50 | HR 72

## 2015-12-04 DIAGNOSIS — M25512 Pain in left shoulder: Secondary | ICD-10-CM

## 2015-12-04 DIAGNOSIS — M25511 Pain in right shoulder: Secondary | ICD-10-CM | POA: Diagnosis not present

## 2015-12-04 DIAGNOSIS — R293 Abnormal posture: Secondary | ICD-10-CM

## 2015-12-04 NOTE — Therapy (Signed)
[place] MAIN [facility] SERVICES [address] [place], [place], [address] Phone: [phone]   Fax:  [phone]  Physical Therapy Treatment  Patient Details  Name: Kelli Gutierrez MRN: [id number] Date of Birth: 09-29-1950 Referring Provider: Melrose Nakayama  Encounter Date: 12/04/2015      PT End of Session - 12/04/15 0953    Visit Number 4   Number of Visits 13   Date for PT Re-Evaluation 01/01/16   Authorization Type 4/10   PT Start Time 0845   PT Stop Time 0930   PT Time Calculation (min) 45 min   Activity Tolerance Patient tolerated treatment well;Patient limited by pain;Patient limited by fatigue   Behavior During Therapy [facility] for tasks assessed/performed      Past Medical History:  Diagnosis Date  . Chronic kidney disease 12/2015  . Diabetes mellitus without complication ([place])   . Hypertension   . Rheumatoid arthritis ([place])   . Stroke [facility])     Past Surgical History:  Procedure Laterality Date  . MIDDLE EAR SURGERY      Vitals:   12/04/15 0940  BP: (!) 136/50  Pulse: 72  SpO2: 100%        Subjective Assessment - 12/04/15 0941    Subjective Pt reports her pain as the same as previous session with inconsistent 7/10 pain at the worst regardless of position.  She reports that after each PT session she has pain relief but her pain returns later that day.  She has been completing her HEP 1-2x/day.  Additionally, she says that she has not yet taken her BP medication as she takes this after eating, usually around 10am.   Pertinent History CVA 1999, CVA 12/2014, DM, HTN, last hemoglobin 8.1   Limitations Lifting;House hold activities   How long can you stand comfortably? 5 min   How long can you walk comfortably? 5 min   Diagnostic tests MRI in hospital   Patient Stated Goals decrease pain   Currently in Pain? No/denies   Aggravating Factors  overhead activities, driving       TREATMENT:  Manual:  Bil shoulder inferior and  posterior joint mobs, 2x30 sec bouts each direction in 90 deg abduction (Lt shoulder in 120 deg abduction last bout for each mob); pt reported decreased pain following treatment.  Therapeutic Exercise: Reviewed HEP with patient; Towel slides on wall, flexion and abduction, 2x10 Scap retraction and low rows with RTB, 2x10 each Verbal and tactile cues to focus on shoulder depression and scapular retraction with low rows and scapular retraction exercises Thoracic extension sitting in chair with back support and towel roll 3x10 Light manual resistance simulating overhead reaching activity Bil shoulders into abduction and flexion in standing          PT Education - 12/04/15 0952    Education provided Yes   Education Details Reviewed HEP with cues provided for proper technique. Pt confirmed she still has HEP handout from [facility].   Person(s) Educated Patient   Methods Explanation;Demonstration;Tactile cues;Verbal cues   Comprehension Verbalized understanding;Returned demonstration             PT Long Term Goals - 12/02/15 0957      PT LONG TERM GOAL #1   Title pt will be independent with HEP to promote recovery and decrease reinjury.   Time 6   Period Weeks   Status New     PT LONG TERM GOAL #2   Title pt will be able to sleep  without waking up more than 2 times because of the pain.   Baseline wakes up multiple times a night   Time 6   Period Weeks   Status New     PT LONG TERM GOAL #3   Title pt will have improved bil shoulder flexion and abduction AROM to 150 deg to improve overall function with household chores.   Baseline R flex: 79 deg, abd: 66 deg;  L flex: 67 deg, abd: 75 deg   Time 6   Period Weeks   Status New               Plan - 12/04/15 [id number]    Clinical Impression Statement Pt reports relief after each PT session and is completing HEP at home as prescribed.  Will continue to progress strengthening program and mobilizations for pain relief and ease with  functional activities.     Rehab Potential Fair   Clinical Impairments Affecting Rehab Potential potential difficulty with motivation; prior strokes, pain   PT Frequency 2x / week   PT Duration 6 weeks   PT Treatment/Interventions ADLs/Self Care Home Management;Electrical Stimulation;Moist Heat;Functional mobility training;Therapeutic activities;Therapeutic exercise;Neuromuscular re-education;Patient/family education;Manual techniques;Passive range of motion;Dry needling   PT Next Visit Plan functional resistance exercises, shoulder mobilizations, UE ranger, thoracic extension   PT Home Exercise Plan Lumbar roll when sitting, biceps stretch in doorway   Consulted and Agree with Plan of Care Patient      Patient will benefit from skilled therapeutic intervention in order to improve the following deficits and impairments:  Decreased activity tolerance, Decreased mobility, Decreased range of motion, Decreased strength, Hypomobility, Increased muscle spasms, Impaired flexibility, Postural dysfunction, Improper body mechanics, Pain  Visit Diagnosis: Pain in right shoulder  Pain in left shoulder  Abnormal posture     Problem List Patient Active Problem List   Diagnosis Date Noted  . Acute CVA (cerebrovascular accident) ([place]) 12/17/2014  . CVA (cerebral infarction) 12/17/2014    Collie Siad PT, DPT  12/04/2015, 3:01 PM Phillips Grout PT, DPT    [place] MAIN [facility] [address] [place], [place], [address] Phone: [phone]   Fax:  [phone]  Name: Kelli Gutierrez MRN: [id number] Date of Birth: 12/13/1950

## 2015-12-08 ENCOUNTER — Ambulatory Visit: Payer: Medicare Other

## 2015-12-08 VITALS — BP 102/49 | HR 76

## 2015-12-08 DIAGNOSIS — M25511 Pain in right shoulder: Secondary | ICD-10-CM

## 2015-12-08 DIAGNOSIS — R293 Abnormal posture: Secondary | ICD-10-CM

## 2015-12-08 DIAGNOSIS — M25512 Pain in left shoulder: Secondary | ICD-10-CM

## 2015-12-08 NOTE — Therapy (Signed)
[place] MAIN [facility] SERVICES [address] [place], [place], [address] Phone: [phone]   Fax:  [phone]  Physical Therapy Treatment  Patient Details  Name: Kelli Gutierrez MRN: [id number] Date of Birth: December 11, 1950 Referring Provider: Melrose Nakayama  Encounter Date: 12/08/2015      PT End of Session - 12/08/15 0851    Visit Number 5   Number of Visits 13   Date for PT Re-Evaluation 01/01/16   Authorization Type 5/10   PT Start Time 0846   PT Stop Time 0930   PT Time Calculation (min) 44 min   Activity Tolerance Patient tolerated treatment well;Patient limited by pain   Behavior During Therapy [facility] for tasks assessed/performed      Past Medical History:  Diagnosis Date  . Chronic kidney disease 12/2015  . Diabetes mellitus without complication ([place])   . Hypertension   . Rheumatoid arthritis ([place])   . Stroke [facility])     Past Surgical History:  Procedure Laterality Date  . MIDDLE EAR SURGERY      Vitals:   12/08/15 0844  BP: (!) 102/49  Pulse: 76  SpO2: 100%        Subjective Assessment - 12/08/15 0844    Subjective Pt states that her R shoulder pain is improving. She rates the pain currently as 5/10 and states that she is having an easier time sleeping.  L shoulder continues to hurt and she rates her pain as 7/10. She continues to perform HEP. No specific questions or concerns at this time.    Pertinent History CVA 1999, CVA 12/2014, DM, HTN, last hemoglobin 8.1   Limitations Lifting;House hold activities   How long can you stand comfortably? 5 min   How long can you walk comfortably? 5 min   Diagnostic tests MRI in hospital   Patient Stated Goals decrease pain   Currently in Pain? Yes   Pain Score 7    Pain Location Shoulder   Pain Orientation Left   Pain Descriptors / Indicators Constant   Pain Type Chronic pain   Pain Onset More than a month ago   Pain Frequency Constant   Multiple Pain Sites Yes   Pain Score 5   Pain  Location Shoulder   Pain Orientation Right   Pain Descriptors / Indicators Aching   Pain Type Chronic pain   Pain Onset More than a month ago   Pain Frequency Intermittent         Manual Pt with Hx of rheumatoid arthritis so joint mobilizations limited to grade I-II; Gentle bilateral shoulder distraction grade I-II oscillations moving through abduction PROM; Bilateral shoulder AP joint mobilizations, grade I-II 30 seconds/bout x 2 bouts; Bilateral superior to inferior joint mobs, grade I-II, 30 seconds/bout x 2 bouts in 90 deg abduction; Pt with persistent painful PROM follow mobilizations, L shoulder is more painful than R;   Therapeutic Exercise Thoracic extension sitting in chair with back support and towel roll 3 x 10  Towel slides on wall for flexion 2 x 10 bilaterally;  Push up plus on wall for serratus activations Scap retraction and low rows with RTB, x 10; Scap retraction and bilateral shoulder extension with RTB 2 x 10; Verbal and tactile cues to focus on shoulder depression and scapular retraction with low rows and scapular retraction exercises                          PT Education -  12/08/15 0850    Education provided Yes   Education Details Continue HEP, cues for exercise   Person(s) Educated Patient   Methods Explanation   Comprehension Verbalized understanding             PT Long Term Goals - 12/02/15 0957      PT LONG TERM GOAL #1   Title pt will be independent with HEP to promote recovery and decrease reinjury.   Time 6   Period Weeks   Status New     PT LONG TERM GOAL #2   Title pt will be able to sleep without waking up more than 2 times because of the pain.   Baseline wakes up multiple times a night   Time 6   Period Weeks   Status New     PT LONG TERM GOAL #3   Title pt will have improved bil shoulder flexion and abduction AROM to 150 deg to improve overall function with household chores.   Baseline R flex: 79 deg,  abd: 66 deg;  L flex: 67 deg, abd: 75 deg   Time 6   Period Weeks   Status New               Plan - 12/08/15 [id number]    Clinical Impression Statement Pt reports slowly improving R shoulder pain but persistent L shoulder pain. She has a history of RA so joint mobilizations were limited to grade I-II for pain control. Pt demonstrates gradually progressing AROM/AAROM throughout session. Pt encouraged to continue HEP and follow-up as scheduled.    Rehab Potential Fair   Clinical Impairments Affecting Rehab Potential potential difficulty with motivation; prior strokes, pain   PT Frequency 2x / week   PT Duration 6 weeks   PT Treatment/Interventions ADLs/Self Care Home Management;Electrical Stimulation;Moist Heat;Functional mobility training;Therapeutic activities;Therapeutic exercise;Neuromuscular re-education;Patient/family education;Manual techniques;Passive range of motion;Dry needling   PT Next Visit Plan functional resistance exercises, shoulder mobilizations (grade I-II), UE ranger, thoracic extension and postural correction   PT Home Exercise Plan Lumbar roll when sitting, biceps stretch in doorway, towel slides on wall, flexion and abduction 2 x 10, scap retraction and low rows with RTB 2 x 10 1x/day.   Consulted and Agree with Plan of Care Patient      Patient will benefit from skilled therapeutic intervention in order to improve the following deficits and impairments:  Decreased activity tolerance, Decreased mobility, Decreased range of motion, Decreased strength, Hypomobility, Increased muscle spasms, Impaired flexibility, Postural dysfunction, Improper body mechanics, Pain  Visit Diagnosis: Pain in right shoulder  Pain in left shoulder  Abnormal posture     Problem List Patient Active Problem List   Diagnosis Date Noted  . Acute CVA (cerebrovascular accident) ([place]) 12/17/2014  . CVA (cerebral infarction) 12/17/2014   Phillips Grout PT, DPT    Huprich,Jason 12/08/2015, 9:31 AM  [place] MAIN [facility] SERVICES [address] [place], [place], [address] Phone: [phone]   Fax:  [phone]  Name: Kelli Gutierrez MRN: [id number] Date of Birth: February 17, 1951

## 2015-12-10 ENCOUNTER — Ambulatory Visit: Payer: Medicare Other | Admitting: Physical Therapy

## 2015-12-10 DIAGNOSIS — M25511 Pain in right shoulder: Secondary | ICD-10-CM | POA: Diagnosis not present

## 2015-12-10 DIAGNOSIS — M25512 Pain in left shoulder: Secondary | ICD-10-CM

## 2015-12-10 DIAGNOSIS — R293 Abnormal posture: Secondary | ICD-10-CM

## 2015-12-10 NOTE — Therapy (Signed)
[place] MAIN [facility] SERVICES [address] [place], [place], [address] Phone: [phone]   Fax:  [phone]  Physical Therapy Treatment  Patient Details  Name: Kelli Gutierrez MRN: [id number] Date of Birth: [birth year] Referring Provider: Melrose Nakayama  Encounter Date: 12/10/2015      PT End of Session - 12/10/15 0929    Visit Number 6   Number of Visits 13   Date for PT Re-Evaluation 01/01/16   Authorization Type 6/10   PT Start Time 0847   PT Stop Time 0930   PT Time Calculation (min) 43 min   Activity Tolerance Patient tolerated treatment well;Patient limited by pain   Behavior During Therapy [facility] for tasks assessed/performed      Past Medical History:  Diagnosis Date  . Chronic kidney disease 12/2015  . Diabetes mellitus without complication ([place])   . Hypertension   . Rheumatoid arthritis ([place])   . Stroke [facility])     Past Surgical History:  Procedure Laterality Date  . MIDDLE EAR SURGERY      There were no vitals filed for this visit.      Subjective Assessment - 12/10/15 0853    Subjective Pt reports she had relief in her Rt shoulder for one day and night after last episode; however her Lt shoulder hurts continuously. 7/10 Bil shoulders currently.  Pt expresses that she believes she is making progress with PT and is motivated to continue.   Pertinent History CVA 1999, CVA 12/2014, DM, HTN, last hemoglobin 8.1   Limitations Lifting;House hold activities   How long can you stand comfortably? 5 min   How long can you walk comfortably? 5 min   Diagnostic tests MRI in hospital   Patient Stated Goals decrease pain   Currently in Pain? Yes   Pain Score 7    Pain Location Shoulder   Pain Orientation Left   Pain Descriptors / Indicators Constant;Aching   Pain Type Chronic pain   Pain Onset More than a month ago   Pain Score 7   Pain Location Shoulder   Pain Orientation Right   Pain Descriptors / Indicators Aching;Constant   Pain  Type Chronic pain   Pain Onset More than a month ago        TREATMENT   Manual:  Pt with Hx of rheumatoid arthritis so joint mobilizations limited to grade I-II;  Gentle bilateral shoulder distraction grade I-II oscillations moving through abduction PROM;  Bilateral?shoulder AP joint mobilizations, grade I-II 30 seconds/bout x 2 bouts;  Bilateral superior to inferior joint mobs, grade I-II, 30 seconds/bout x 2 bouts?in 90 deg abduction;  Pt with persistent painful PROM following mobilizations but reports some relief, L shoulder is more painful than R;?   Therapeutic Exercise:  Scapular punches in supine 2x10 each side, min support provided to scapula on Lt  Thoracic extension sitting in chair with back support and towel roll 3 x 10  Push up plus on wall for serratus activations 2x10. Pt instructed to take a step closer to wall for relief of pain with exercise. Cues for technique to achieve the push plus.  Scap retraction (seated) and low rows (standing) with RTB 2x10  Verbal and tactile cues to focus on shoulder depression and scapular retraction with low rows and scapular retraction exercises as well as discussion about incorporating this posture when performing exercises at home.           PT Education - 12/10/15 0901    Education  provided Yes   Education Details Exercise technique and progression   Person(s) Educated Patient   Methods Explanation   Comprehension Verbalized understanding             PT Long Term Goals - 12/02/15 0957      PT LONG TERM GOAL #1   Title pt will be independent with HEP to promote recovery and decrease reinjury.   Time 6   Period Weeks   Status New     PT LONG TERM GOAL #2   Title pt will be able to sleep without waking up more than 2 times because of the pain.   Baseline wakes up multiple times a night   Time 6   Period Weeks   Status New     PT LONG TERM GOAL #3   Title pt will have improved bil shoulder flexion and  abduction AROM to 150 deg to improve overall function with household chores.   Baseline R flex: 79 deg, abd: 66 deg;  L flex: 67 deg, abd: 75 deg   Time 6   Period Weeks   Status New               Plan - 12/10/15 0912    Clinical Impression Statement Pt reports pain relief for a longer duration following previous session and reports moderate relief of Bil shoulder pain following joint mobilizations this session.  Will continue to focus on mobilizations combined with strengthening and neuromuscular rehabilitation exercises to improve function and relieve pain.   Rehab Potential Fair   Clinical Impairments Affecting Rehab Potential potential difficulty with motivation; prior strokes, pain   PT Frequency 2x / week   PT Duration 6 weeks   PT Treatment/Interventions ADLs/Self Care Home Management;Electrical Stimulation;Moist Heat;Functional mobility training;Therapeutic activities;Therapeutic exercise;Neuromuscular re-education;Patient/family education;Manual techniques;Passive range of motion;Dry needling   PT Next Visit Plan functional resistance exercises, shoulder mobilizations (grade I-II), UE ranger, thoracic extension and postural correction   PT Home Exercise Plan Lumbar roll when sitting, biceps stretch in doorway, towel slides on wall, flexion and abduction 2 x 10, scap retraction and low rows with RTB 2 x 10 1x/day.   Consulted and Agree with Plan of Care Patient      Patient will benefit from skilled therapeutic intervention in order to improve the following deficits and impairments:  Decreased activity tolerance, Decreased mobility, Decreased range of motion, Decreased strength, Hypomobility, Increased muscle spasms, Impaired flexibility, Postural dysfunction, Improper body mechanics, Pain  Visit Diagnosis: Pain in right shoulder  Pain in left shoulder  Abnormal posture     Problem List Patient Active Problem List   Diagnosis Date Noted  . Acute CVA (cerebrovascular  accident) ([place]) 12/17/2014  . CVA (cerebral infarction) 12/17/2014    Collie Siad PT, DPT 12/10/2015, 9:31 AM  [place] MAIN [facility] SERVICES [address] [place], [place], [address] Phone: [phone]   Fax:  [phone]  Name: Kelli Gutierrez MRN: [id number] Date of Birth: [birth year]

## 2015-12-12 DIAGNOSIS — N189 Chronic kidney disease, unspecified: Secondary | ICD-10-CM

## 2015-12-12 HISTORY — DX: Chronic kidney disease, unspecified: N18.9

## 2015-12-16 ENCOUNTER — Encounter: Payer: Self-pay | Admitting: Physical Therapy

## 2015-12-16 ENCOUNTER — Ambulatory Visit: Payer: Medicare Other | Attending: Neurology | Admitting: Physical Therapy

## 2015-12-16 VITALS — BP 129/79 | HR 74

## 2015-12-16 DIAGNOSIS — R293 Abnormal posture: Secondary | ICD-10-CM | POA: Diagnosis present

## 2015-12-16 DIAGNOSIS — M25512 Pain in left shoulder: Secondary | ICD-10-CM | POA: Diagnosis present

## 2015-12-16 DIAGNOSIS — M25511 Pain in right shoulder: Secondary | ICD-10-CM | POA: Insufficient documentation

## 2015-12-16 NOTE — Therapy (Signed)
[place] MAIN [facility] SERVICES [address] [place], [place], [address] Phone: [phone]   Fax:  [phone]  Physical Therapy Treatment  Patient Details  Name: Kelli Gutierrez MRN: [id number] Date of Birth: 09/25/1950 Referring Provider: Melrose Nakayama  Encounter Date: 12/16/2015      PT End of Session - 12/16/15 0844    Visit Number 7   Number of Visits 13   Date for PT Re-Evaluation 01/01/16   Authorization Type 7/10   PT Start Time 0810   PT Stop Time 0842   PT Time Calculation (min) 32 min   Activity Tolerance Patient tolerated treatment well;Patient limited by pain   Behavior During Therapy [facility] for tasks assessed/performed      Past Medical History:  Diagnosis Date  . Chronic kidney disease 12/2015  . Diabetes mellitus without complication ([place])   . Hypertension   . Rheumatoid arthritis ([place])   . Stroke [facility])     Past Surgical History:  Procedure Laterality Date  . MIDDLE EAR SURGERY      Vitals:   12/16/15 0813  BP: 129/79  Pulse: 74  SpO2: 100%        Subjective Assessment - 12/16/15 0812    Subjective Pt arrived 10 minutes late to appointment today.  Pt reports she occassionally has days when her shoulders don't hurt at all, typically after PT session.  Says she is having less pain when donning/doffing shirt overhead.  Does not have any complaints or concerns at this time.   Pertinent History CVA 1999, CVA 12/2014, DM, HTN, last hemoglobin 8.1   Limitations Lifting;House hold activities   How long can you stand comfortably? 5 min   How long can you walk comfortably? 5 min   Diagnostic tests MRI in hospital   Patient Stated Goals decrease pain   Currently in Pain? Yes   Pain Score 7    Pain Location Shoulder   Pain Orientation Left   Pain Descriptors / Indicators Aching   Pain Type Chronic pain   Pain Onset More than a month ago   Pain Frequency Intermittent   Pain Onset More than a month ago       TREATMENT  Manual  Pt with Hx of rheumatoid arthritis so joint mobilizations limited to grade I-II;  Gentle bilateral shoulder distraction grade I-II oscillations moving through abduction PROM 3x30 seconds;  Bilateral shoulder AP joint mobilizations, grade I-II 30 seconds/bout x 3 bouts;  Bilateral superior to inferior joint mobs, grade I-II, 30 seconds/bout x 3 bouts?in 90 deg abduction;   Therapeutic Exercise   Thoracic extension sitting in chair with back support and half foam roll 2 x 10  Push up plus on wall for serratus activations 2x10. Pt instructed to take a step closer to wall for relief of pain with exercise. Demonstrated to pt prior to pt attempting. Cues for technique to achieve the push plus.  Practiced achieving scapular retraction and depression prior to overhead flexion activities in standing          PT Education - 12/16/15 0823    Education provided Yes   Education Details Posture, exercise technique   Person(s) Educated Patient   Methods Explanation;Demonstration;Verbal cues;Tactile cues   Comprehension Verbalized understanding;Returned demonstration             PT Long Term Goals - 12/02/15 0957      PT LONG TERM GOAL #1   Title pt will be independent with HEP to promote recovery  and decrease reinjury.   Time 6   Period Weeks   Status New     PT LONG TERM GOAL #2   Title pt will be able to sleep without waking up more than 2 times because of the pain.   Baseline wakes up multiple times a night   Time 6   Period Weeks   Status New     PT LONG TERM GOAL #3   Title pt will have improved bil shoulder flexion and abduction AROM to 150 deg to improve overall function with household chores.   Baseline R flex: 79 deg, abd: 66 deg;  L flex: 67 deg, abd: 75 deg   Time 6   Period Weeks   Status New               Plan - 12/16/15 [id number]    Clinical Impression Statement Pt reports no pain with Rt shoulder AROM at start of sesion and  demonstrates improved AROM Bil shoulders into flexion and abduction following interventions this session.  Pt tolerated treatement well.  Pt instructed to continue with HEP as provided.   Rehab Potential Fair   Clinical Impairments Affecting Rehab Potential potential difficulty with motivation; prior strokes, pain   PT Frequency 2x / week   PT Duration 6 weeks   PT Treatment/Interventions ADLs/Self Care Home Management;Electrical Stimulation;Moist Heat;Functional mobility training;Therapeutic activities;Therapeutic exercise;Neuromuscular re-education;Patient/family education;Manual techniques;Passive range of motion;Dry needling   PT Next Visit Plan functional resistance exercises, shoulder mobilizations (grade I-II), UE ranger, thoracic extension and postural correction   PT Home Exercise Plan Lumbar roll when sitting, biceps stretch in doorway, towel slides on wall, flexion and abduction 2 x 10, scap retraction and low rows with RTB 2 x 10 1x/day.   Consulted and Agree with Plan of Care Patient      Patient will benefit from skilled therapeutic intervention in order to improve the following deficits and impairments:  Decreased activity tolerance, Decreased mobility, Decreased range of motion, Decreased strength, Hypomobility, Increased muscle spasms, Impaired flexibility, Postural dysfunction, Improper body mechanics, Pain  Visit Diagnosis: Pain in right shoulder  Pain in left shoulder  Abnormal posture     Problem List Patient Active Problem List   Diagnosis Date Noted  . Acute CVA (cerebrovascular accident) ([place]) 12/17/2014  . CVA (cerebral infarction) 12/17/2014    Collie Siad PT, DPT 12/16/2015, 8:49 AM  [place] MAIN [facility] SERVICES [address] [place], [place], [address] Phone: [phone]   Fax:  [phone]  Name: Kelli Gutierrez MRN: [id number] Date of Birth: 1951-03-28

## 2015-12-18 ENCOUNTER — Ambulatory Visit: Payer: Medicare Other | Admitting: Physical Therapy

## 2015-12-18 ENCOUNTER — Encounter: Payer: Self-pay | Admitting: Physical Therapy

## 2015-12-18 VITALS — BP 106/42 | HR 67

## 2015-12-18 DIAGNOSIS — M25511 Pain in right shoulder: Secondary | ICD-10-CM | POA: Diagnosis not present

## 2015-12-18 DIAGNOSIS — R293 Abnormal posture: Secondary | ICD-10-CM

## 2015-12-18 DIAGNOSIS — M25512 Pain in left shoulder: Secondary | ICD-10-CM

## 2015-12-18 NOTE — Therapy (Signed)
[place] MAIN [facility] SERVICES [address] [place], [place], [address] Phone: [phone]   Fax:  [phone]  Physical Therapy Treatment  Patient Details  Name: Kelli Gutierrez MRN: [id number] Date of Birth: 11-15-1950 Referring Provider: Melrose Nakayama  Encounter Date: 12/18/2015      PT End of Session - 12/18/15 0837    Visit Number 8   Number of Visits 13   Date for PT Re-Evaluation 01/01/16   Authorization Type 8/10   PT Start Time 0755   PT Stop Time 0836   PT Time Calculation (min) 41 min   Activity Tolerance Patient tolerated treatment well;Patient limited by pain   Behavior During Therapy [facility] for tasks assessed/performed      Past Medical History:  Diagnosis Date  . Chronic kidney disease 12/2015  . Diabetes mellitus without complication ([place])   . Hypertension   . Rheumatoid arthritis ([place])   . Stroke [facility])     Past Surgical History:  Procedure Laterality Date  . MIDDLE EAR SURGERY      Vitals:   12/18/15 0759  BP: (!) 106/42  Pulse: 67        Subjective Assessment - 12/18/15 0758    Subjective Pt reports her shoulders feel about the same as last session.  Today is the first day she has been able to don a bra.  She fastens it in the front and turns it before pulling on the straps.  Pain at it's worst in both shoulders 7/10.  Says "sometimes I remember, sometimes I forget" when asked if she tries to focus on her posture with overhead activties at home.  Does not currently have pain.     Pertinent History CVA 1999, CVA 12/2014, DM, HTN, last hemoglobin 8.1   Limitations Lifting;House hold activities   How long can you stand comfortably? 5 min   How long can you walk comfortably? 5 min   Diagnostic tests MRI in hospital   Patient Stated Goals decrease pain   Currently in Pain? No/denies   Pain Onset More than a month ago       TREATMENT  Manual  Pt with Hx of rheumatoid arthritis so joint mobilizations limited to grade  I-II;  Gentle bilateral shoulder distraction grade I-II oscillations moving through abduction PROM 3x30 seconds;  Bilateral shoulder AP joint mobilizations, grade I-II 30 seconds/bout x 3 bouts;  Bilateral superior to inferior joint mobs, grade I-II, 30 seconds/bout x 3 bouts in 90 deg abduction;  Improved Bil shoulder F AROM following joint mobilizations, no pain with R flexion  Pt unable to tolerate prone position for thoracic mobilizations   Therapeutic Exercise  Thoracic extension sitting in chair with back support and half foam roll 2 x 10  Push up plus on wall for serratus activations 2x10. Demonstrated to pt prior to pt attempting. Cues for technique to achieve the push plus. Pt denies pain during exercise.  Scapular stabilization on ball against wall with circles clockwise and counterclockwise 10x each direction with each UE, no pain RUE, mod pain LUE  Practiced achieving scapular retraction and depression reaching for cones ipsilaterally and contralaterally             PT Education - 12/18/15 0759    Education provided Yes   Education Details Posture, exercise technique   Person(s) Educated Patient   Methods Explanation;Demonstration;Tactile cues;Verbal cues   Comprehension Verbalized understanding;Returned demonstration             PT  Long Term Goals - 12/02/15 0957      PT LONG TERM GOAL #1   Title pt will be independent with HEP to promote recovery and decrease reinjury.   Time 6   Period Weeks   Status New     PT LONG TERM GOAL #2   Title pt will be able to sleep without waking up more than 2 times because of the pain.   Baseline wakes up multiple times a night   Time 6   Period Weeks   Status New     PT LONG TERM GOAL #3   Title pt will have improved bil shoulder flexion and abduction AROM to 150 deg to improve overall function with household chores.   Baseline R flex: 79 deg, abd: 66 deg;  L flex: 67 deg, abd: 75 deg   Time 6   Period Weeks    Status New               Plan - 12/18/15 0825    Clinical Impression Statement Pt reports no pain with R shoulder flexion AROM following joint mobilizations this session.  Focused exercises on scapular stabilization which pt tolerated well.  Pt continues to reports she is feeling much better and is able to do more household activities.   Rehab Potential Fair   Clinical Impairments Affecting Rehab Potential potential difficulty with motivation; prior strokes, pain   PT Frequency 2x / week   PT Duration 6 weeks   PT Treatment/Interventions ADLs/Self Care Home Management;Electrical Stimulation;Moist Heat;Functional mobility training;Therapeutic activities;Therapeutic exercise;Neuromuscular re-education;Patient/family education;Manual techniques;Passive range of motion;Dry needling   PT Next Visit Plan functional resistance exercises, shoulder mobilizations (grade I-II), scapular stabilizatoin exercises, thoracic extension and postural correction   PT Home Exercise Plan Lumbar roll when sitting, biceps stretch in doorway, towel slides on wall, flexion and abduction 2 x 10, scap retraction and low rows with RTB 2 x 10 1x/day.   Consulted and Agree with Plan of Care Patient      Patient will benefit from skilled therapeutic intervention in order to improve the following deficits and impairments:  Decreased activity tolerance, Decreased mobility, Decreased range of motion, Decreased strength, Hypomobility, Increased muscle spasms, Impaired flexibility, Postural dysfunction, Improper body mechanics, Pain  Visit Diagnosis: Pain in right shoulder  Pain in left shoulder  Abnormal posture     Problem List Patient Active Problem List   Diagnosis Date Noted  . Acute CVA (cerebrovascular accident) ([place]) 12/17/2014  . CVA (cerebral infarction) 12/17/2014    Collie Siad PT, DPT 12/18/2015, 8:38 AM  [place] MAIN [facility] SERVICES [address]  [place], [place], [address] Phone: [phone]   Fax:  [phone]  Name: Kelli Gutierrez MRN: [id number] Date of Birth: 08-27-1950

## 2015-12-22 ENCOUNTER — Encounter: Payer: Self-pay | Admitting: Physical Therapy

## 2015-12-22 ENCOUNTER — Ambulatory Visit: Payer: Medicare Other | Admitting: Physical Therapy

## 2015-12-22 VITALS — BP 134/49 | HR 72

## 2015-12-22 DIAGNOSIS — M25512 Pain in left shoulder: Secondary | ICD-10-CM

## 2015-12-22 DIAGNOSIS — R293 Abnormal posture: Secondary | ICD-10-CM

## 2015-12-22 DIAGNOSIS — M25511 Pain in right shoulder: Secondary | ICD-10-CM | POA: Diagnosis not present

## 2015-12-22 NOTE — Patient Instructions (Addendum)
Mobilization: Inferior Glide    **Sightly different than picture, will use a towel.  Rest Left arm on table while sitting in chair.  Place towel over Left shoulder and pull very gently down on towel using your Right arm.  Do this in an up and down movement for 20 seconds.  Repeat 2 times per set. Do 2 sets each day.  Do 5 sessions per week.   Copyright  VHI. All rights reserved.

## 2015-12-22 NOTE — Therapy (Signed)
[place] MAIN [facility] SERVICES [address] [place], [place], [address] Phone: [phone]   Fax:  [phone]  Physical Therapy Treatment  Patient Details  Name: Kelli Gutierrez MRN: [id number] Date of Birth: [birth year] Referring Provider: Melrose Nakayama  Encounter Date: 12/22/2015      PT End of Session - 12/22/15 0932    Visit Number 9   Number of Visits 13   Date for PT Re-Evaluation 01/01/16   Authorization Type 9/10   PT Start Time 0846   PT Stop Time 0930   PT Time Calculation (min) 44 min   Activity Tolerance Patient tolerated treatment well;Patient limited by pain   Behavior During Therapy [facility] for tasks assessed/performed      Past Medical History:  Diagnosis Date  . Chronic kidney disease 12/2015  . Diabetes mellitus without complication ([place])   . Hypertension   . Rheumatoid arthritis ([place])   . Stroke [facility])     Past Surgical History:  Procedure Laterality Date  . MIDDLE EAR SURGERY      Vitals:   12/22/15 0848  BP: (!) 134/49  Pulse: 72  SpO2: 100%        Subjective Assessment - 12/22/15 0848    Subjective Pt reports increased tingling in her RUE beginning yesterday.  Denies any new activities over the weekend. Neuro screen negative.  Pt denies noticing any signs/symptoms of a stroke.  Instructed pt to call 911 immediately if she notices any signs/symptoms of a stroke.   Pertinent History CVA 1999, CVA 12/2014, DM, HTN, last hemoglobin 8.1   Limitations Lifting;House hold activities   How long can you stand comfortably? 5 min   How long can you walk comfortably? 5 min   Diagnostic tests MRI in hospital   Patient Stated Goals decrease pain   Currently in Pain? Yes   Pain Score 7    Pain Location Shoulder   Pain Orientation Left   Pain Descriptors / Indicators Aching   Pain Type Chronic pain   Pain Onset More than a month ago   Pain Onset More than a month ago      TREATMENT  Manual: Pt with Hx of rheumatoid  arthritis so joint mobilizations limited to grade I-II;  Gentle bilateral shoulder distraction grade I-II oscillations moving through abduction PROM 3x30 seconds;  Bilateral shoulder AP joint mobilizations, grade I-II 30 seconds/bout x 3 bouts;  Bilateral superior to inferior joint mobs, grade I-II, 30 seconds/bout x 3 bouts in 90 deg abduction;  Shoulder AROM prior to mobilizations (L,R) in degrees:  F: 74, 105  Abd: 59, 66  Shoulder AROM after mobilization (L,R) in degrees:  F: 82, 124  Abd: 56, 90   Therapeutic Exercise: Self grade I inferior joint mobs with L shoulder in abduction on treatment table using towel. Added to HEP.  Thoracic extension sitting in chair with back support and half foam roll 2 x 10           PT Education - 12/22/15 0852    Education provided Yes   Education Details Exercise technique; Signs and symptoms of a stroke and to call 911 if she notices any of these; Added exercise to HEP and provided pt with handout   Person(s) Educated Patient   Methods Explanation;Demonstration;Verbal cues;Handout   Comprehension Verbalized understanding;Returned demonstration             PT Long Term Goals - 12/02/15 0957      PT LONG TERM GOAL #  1   Title pt will be independent with HEP to promote recovery and decrease reinjury.   Time 6   Period Weeks   Status New     PT LONG TERM GOAL #2   Title pt will be able to sleep without waking up more than 2 times because of the pain.   Baseline wakes up multiple times a night   Time 6   Period Weeks   Status New     PT LONG TERM GOAL #3   Title pt will have improved bil shoulder flexion and abduction AROM to 150 deg to improve overall function with household chores.   Baseline R flex: 79 deg, abd: 66 deg;  L flex: 67 deg, abd: 75 deg   Time 6   Period Weeks   Status New               Plan - 12/22/15 0917    Clinical Impression Statement Pt reports a decrease in tingling RUE symptoms following joint  mobilizations. Additionally, a significant improvement in L shoulder flexion and R shoulder F and Abd AROM following joint mobilizations this session.  Introduced [employer] to [employer] (see pt instructions).  Will continue with skilled PT interventions.   Rehab Potential Fair   Clinical Impairments Affecting Rehab Potential potential difficulty with motivation; prior strokes, pain   PT Frequency 2x / week   PT Duration 6 weeks   PT Treatment/Interventions ADLs/Self Care Home Management;Electrical Stimulation;Moist Heat;Functional mobility training;Therapeutic activities;Therapeutic exercise;Neuromuscular re-education;Patient/family education;Manual techniques;Passive range of motion;Dry needling   PT Next Visit Plan functional resistance exercises, shoulder mobilizations (grade I-II), scapular stabilizatoin exercises, thoracic extension and postural correction   PT Home Exercise Plan Lumbar roll when sitting, biceps stretch in doorway, towel slides on wall, flexion and abduction 2 x 10, scap retraction and low rows with RTB 2 x 10 1x/day, self mobilization inferior glide to L shoulder   Consulted and Agree with Plan of Care Patient      Patient will benefit from skilled therapeutic intervention in order to improve the following deficits and impairments:  Decreased activity tolerance, Decreased mobility, Decreased range of motion, Decreased strength, Hypomobility, Increased muscle spasms, Impaired flexibility, Postural dysfunction, Improper body mechanics, Pain  Visit Diagnosis: Pain in right shoulder  Pain in left shoulder  Abnormal posture     Problem List Patient Active Problem List   Diagnosis Date Noted  . Acute CVA (cerebrovascular accident) ([place]) 12/17/2014  . CVA (cerebral infarction) 12/17/2014    Collie Siad PT, DPT 12/22/2015, 9:35 AM  [place] MAIN [facility] SERVICES [address] [place], [place], [address] Phone: [phone]    Fax:  [phone]  Name: Kelli Gutierrez MRN: [id number] Date of Birth: 05-01-1950

## 2015-12-24 ENCOUNTER — Ambulatory Visit: Payer: Medicare Other | Admitting: Physical Therapy

## 2015-12-24 ENCOUNTER — Encounter: Payer: Self-pay | Admitting: Physical Therapy

## 2015-12-24 VITALS — BP 146/52 | HR 80

## 2015-12-24 DIAGNOSIS — M25511 Pain in right shoulder: Secondary | ICD-10-CM

## 2015-12-24 DIAGNOSIS — M25512 Pain in left shoulder: Secondary | ICD-10-CM

## 2015-12-24 DIAGNOSIS — R293 Abnormal posture: Secondary | ICD-10-CM

## 2015-12-24 NOTE — Therapy (Signed)
[name] [facility] SERVICES [address] [place], [place], [address] Phone: [phone]   Fax:  [phone]  Physical Therapy Treatment  Patient Details  Name: Kelli Gutierrez MRN: [id number] Date of Birth: [birth year] Referring Provider: Melrose Nakayama  Encounter Date: 12/24/2015      PT End of Session - 12/24/15 1230    Visit Number 10   Number of Visits 21   Date for PT Re-Evaluation 01/21/16   Authorization Type 1/10   PT Start Time 0830   PT Stop Time 0918   PT Time Calculation (min) 48 min   Activity Tolerance Patient tolerated treatment well;Patient limited by pain   Behavior During Therapy [facility] for tasks assessed/performed      Past Medical History:  Diagnosis Date  . Chronic kidney disease 12/2015  . Diabetes mellitus without complication ([place])   . Hypertension   . Rheumatoid arthritis ([place])   . Stroke [facility])     Past Surgical History:  Procedure Laterality Date  . MIDDLE EAR SURGERY      Vitals:   12/24/15 0837  BP: (!) 146/52  Pulse: 80  SpO2: 100%        Subjective Assessment - 12/24/15 0834    Subjective Says her shoulders are hurting more because of the rain.  Current pain 0/10 Bil shoulders, worst pain 7/10 Bil shoulders.  Typcially feels her pain more at night.  Is sleeping with towel under RUE and rests her LUE on her stomach; however does wake up at times with her LUE hanging off side of couch (sleeps on couch).  Pt gives multiple reasons why she sleeps on the couch instead of the bed, has been sleeping on the couch for ~40 years (ocassionally sleeps in bed 4x/month).  Per pt it is more difficult for her to get OOB because of pain in her shoulders, she feels more scared when sleeping in her bed, she wants to be able to see if her son gets OOB.  Wakes up ~3x/night, two of which are because of need to void and the other because of shoulder pain.   Pertinent History CVA 1999, CVA 12/2014, DM, HTN, last hemoglobin 8.1   Limitations Lifting;House hold activities   How long can you stand comfortably? 5 min   How long can you walk comfortably? 5 min   Diagnostic tests MRI in hospital   Patient Stated Goals decrease pain   Currently in Pain? No/denies   Pain Onset More than a month ago   Pain Onset More than a month ago       TREATMENT QuickDash: 86.4% Shoulder AROM in degrees at start of session (L,R):  F: 101 (shoulder pain), 137 (painfree)  Abd: 61 (shoulder pain), 92 deg (shoulder pain)  Shoulder strength, scores out of 5 (L,R):  F: 4-, 4+  Abd: 3+, 5  IR: 4+, 5  ER: 4+, 5   Therapeutic Activity prior to manual therapy and therapeutic exercise:  Log roll technique for OOB, pt able to peform independently after repeated trials with min assist and max verbal and tactile cues   Manual  Pt with Hx of rheumatoid arthritis so joint mobilizations limited to grade I-II;  Bilateral shoulder AP joint mobilizations with distraction, grade I-II 30 seconds/bout x 3 bouts;  Bilateral superior to inferior joint mobs with distraction, grade I-II, 30 seconds/bout x 3 bouts in 90 deg abduction;   Therapeutic Exercise  Thoracic extension sitting in chair with back support and half foam  roll 2 x 10  Scapular Retraction in sitting with tactile and verbal cues for technique               PT Education - 12/24/15 0859    Education provided Yes   Education Details Discussed extensively about trialing sleeping in bed with BUEs supported with pillows under arms to decrease pain.  Log roll technique.     Person(s) Educated Patient   Methods Explanation;Demonstration;Tactile cues;Verbal cues   Comprehension Verbalized understanding;Returned demonstration             PT Long Term Goals - 12/24/15 1236      PT LONG TERM GOAL #1   Title pt will be independent with HEP to promote recovery and decrease reinjury.   Time 6   Period Weeks   Status Achieved     PT LONG TERM GOAL #2   Title Pt will not  wake up at night due to pain   Baseline waking up 1x/night due to pain (12/24/15)   Time 4   Period Weeks   Status Revised     PT LONG TERM GOAL #3   Title pt will have improved bil shoulder flexion and abduction AROM to 150 deg to improve overall function with household chores.   Baseline R flex: 79 deg, abd: 66 deg;  L flex: 67 deg, abd: 75 deg----on 12/24/15 R flex: 137 deg, abd: 92 deg;  L flex: 101 deg, abd: 61 deg   Time 4   Period Weeks   Status Revised               Plan - 12/24/15 0901    Clinical Impression Statement Discussed at length trialing sleeping in bed rather than couch so that [place] can be supported on pillows to decrease pain.  Pt agreed to sleep in bed until next session to assess any change in pain.  Next session will consider discussing installing bed rialing if pain has decreased with sleeping in bed.  Instructed pt to bring in photograph of bed to determine ability to add railing.  Pt was able to achieve OOB with supervision using log roll technique after multiple attempts with min asist.  Pt continues to have significant functional limitations as demonstrated by QuickDASH but is making progress with strength and decreased pain with specific activities (i.e. donning bra and shirt, driving)   Rehab Potential Fair   Clinical Impairments Affecting Rehab Potential potential difficulty with motivation; prior strokes, pain   PT Frequency 2x / week   PT Duration 6 weeks   PT Treatment/Interventions ADLs/Self Care Home Management;Electrical Stimulation;Moist Heat;Functional mobility training;Therapeutic activities;Therapeutic exercise;Neuromuscular re-education;Patient/family education;Manual techniques;Passive range of motion;Dry needling   PT Next Visit Plan Ask about sleeping in bed and consider discussion about adding bed rail.  Functional resistance exercises, shoulder mobilizations (grade I-II), scapular stabilizatoin exercises, thoracic extension and postural  correction   PT Home Exercise Plan Lumbar roll when sitting, biceps stretch in doorway, towel slides on wall, flexion and abduction 2 x 10, scap retraction and low rows with RTB 2 x 10 1x/day, self mobilization inferior glide to L shoulder   Consulted and Agree with Plan of Care Patient      Patient will benefit from skilled therapeutic intervention in order to improve the following deficits and impairments:  Decreased activity tolerance, Decreased mobility, Decreased range of motion, Decreased strength, Hypomobility, Increased muscle spasms, Impaired flexibility, Postural dysfunction, Improper body mechanics, Pain  Visit Diagnosis: Pain in right shoulder  Pain  in left shoulder  Abnormal posture       G-Codes - 01/18/16 1244    Functional Assessment Tool Used QuickDASH, clinical judgement, MMT, goniometry   Functional Limitation Carrying, moving and handling objects   Mobility: Walking and Moving Around Current Status (X0379) At least 20 percent but less than 40 percent impaired, limited or restricted   Mobility: Walking and Moving Around Goal Status [phone]) At least 1 percent but less than 20 percent impaired, limited or restricted      Problem List Patient Active Problem List   Diagnosis Date Noted  . Acute CVA (cerebrovascular accident) ([place]) 12/17/2014  . CVA (cerebral infarction) 12/17/2014    Collie Siad PT, DPT 01/18/16, 12:52 PM  [place] MAIN [facility] SERVICES [address] [place], [place], [address] Phone: [phone]   Fax:  [phone]  Name: GENNI BUSKE MRN: [id number] Date of Birth: 01/01/1951

## 2015-12-29 ENCOUNTER — Encounter: Payer: Self-pay | Admitting: Physical Therapy

## 2015-12-29 ENCOUNTER — Ambulatory Visit: Payer: Medicare Other | Admitting: Physical Therapy

## 2015-12-29 VITALS — BP 154/36 | HR 74

## 2015-12-29 DIAGNOSIS — R293 Abnormal posture: Secondary | ICD-10-CM

## 2015-12-29 DIAGNOSIS — M25511 Pain in right shoulder: Secondary | ICD-10-CM | POA: Diagnosis not present

## 2015-12-29 DIAGNOSIS — M25512 Pain in left shoulder: Secondary | ICD-10-CM

## 2015-12-29 NOTE — Therapy (Signed)
[place] MAIN [facility] SERVICES [address] [place], [place], [address] Phone: [phone]   Fax:  [phone]  Physical Therapy Treatment  Patient Details  Name: Kelli Gutierrez MRN: [id number] Date of Birth: [birth year] Referring Provider: Melrose Nakayama  Encounter Date: 12/29/2015      PT End of Session - 12/29/15 0929    Visit Number 11   Number of Visits 21   Date for PT Re-Evaluation 01/21/16   Authorization Type 2/10   PT Start Time 0847   PT Stop Time 0929   PT Time Calculation (min) 42 min   Activity Tolerance Patient tolerated treatment well;Patient limited by pain   Behavior During Therapy [facility] for tasks assessed/performed      Past Medical History:  Diagnosis Date  . Chronic kidney disease 12/2015  . Diabetes mellitus without complication ([place])   . Hypertension   . Rheumatoid arthritis ([place])   . Stroke [facility])     Past Surgical History:  Procedure Laterality Date  . MIDDLE EAR SURGERY      Vitals:   12/29/15 0853  BP: (!) 154/36  Pulse: 74  SpO2: 100%        Subjective Assessment - 12/29/15 0854    Subjective Pt reports she trialed sleeping in her bec x4 nights since last session but this did not help her pain.  When she sleeps on the couch she says she can shift her weight to the R some to relieve her L shoulder pain, but she is unable to do this when sleeping in the bed.  She has begun placing a towel under LUE while sleeping on the courch which she believes has helped.     Pertinent History CVA 1999, CVA 12/2014, DM, HTN, last hemoglobin 8.1   Limitations Lifting;House hold activities   How long can you stand comfortably? 5 min   How long can you walk comfortably? 5 min   Diagnostic tests MRI in hospital   Patient Stated Goals decrease pain   Currently in Pain? No/denies   Pain Onset More than a month ago        TREATMENT Manual:  Pt with Hx of rheumatoid arthritis so joint mobilizations limited to grade I-II;   Bilateral shoulder AP joint mobilizations with distraction, grade I-II 30 seconds/bout x 3 bouts;  Bilateral superior to inferior joint mobs with distraction, grade I-II, 30 seconds/bout x 3 bouts in 90 deg abduction;  Thoracic CPAs grade I-II T1-7, 30 seconds/bout x2 bouts   Therapeutic Exercise:  Thoracic extension sitting in chair with back support and half foam roll 3 x 10  Scapular retraction YTB, 2x10  High row YTB 2x10  Push up plus on wall with cues for complete plus, 2x10  Low rows with YTB 2x10  Standing Bil shoulder flexion with YTB 2x10  Cues for scapular setting and forward gaze with each exercise                PT Education - 12/29/15 0857    Education provided Yes   Education Details Exercise technique, cues for posture   Person(s) Educated Patient   Methods Explanation;Demonstration;Tactile cues;Verbal cues   Comprehension Verbalized understanding;Returned demonstration             PT Long Term Goals - 12/24/15 1236      PT LONG TERM GOAL #1   Title pt will be independent with HEP to promote recovery and decrease reinjury.   Time 6   Period Weeks  Status Achieved     PT LONG TERM GOAL #2   Title Pt will not wake up at night due to pain   Baseline waking up 1x/night due to pain (12/24/15)   Time 4   Period Weeks   Status Revised     PT LONG TERM GOAL #3   Title pt will have improved bil shoulder flexion and abduction AROM to 150 deg to improve overall function with household chores.   Baseline R flex: 79 deg, abd: 66 deg;  L flex: 67 deg, abd: 75 deg----on 12/24/15 R flex: 137 deg, abd: 92 deg;  L flex: 101 deg, abd: 61 deg   Time 4   Period Weeks   Status Revised               Plan - 12/29/15 0909    Clinical Impression Statement Pt was unable to tolerate sleeping in bed but has found some relief placing towel under LUE while sleeping on couch.  Pt tolerated all interventions well this date with max 5/10 pain L shoulder.  Will  benefit from continued PT services to focus on strengthening, posture, and reduced pain.   Rehab Potential Fair   Clinical Impairments Affecting Rehab Potential potential difficulty with motivation; prior strokes, pain   PT Frequency 2x / week   PT Duration 6 weeks   PT Treatment/Interventions ADLs/Self Care Home Management;Electrical Stimulation;Moist Heat;Functional mobility training;Therapeutic activities;Therapeutic exercise;Neuromuscular re-education;Patient/family education;Manual techniques;Passive range of motion;Dry needling   PT Next Visit Plan Functional resistance exercises, shoulder mobilizations (grade I-II), scapular stabilizatoin exercises, thoracic extension and postural correction   PT Home Exercise Plan Lumbar roll when sitting, biceps stretch in doorway, towel slides on wall, flexion and abduction 2 x 10, scap retraction and low rows with RTB 2 x 10 1x/day, self mobilization inferior glide to L shoulder   Consulted and Agree with Plan of Care Patient      Patient will benefit from skilled therapeutic intervention in order to improve the following deficits and impairments:  Decreased activity tolerance, Decreased mobility, Decreased range of motion, Decreased strength, Hypomobility, Increased muscle spasms, Impaired flexibility, Postural dysfunction, Improper body mechanics, Pain  Visit Diagnosis: Pain in right shoulder  Pain in left shoulder  Abnormal posture     Problem List Patient Active Problem List   Diagnosis Date Noted  . Acute CVA (cerebrovascular accident) ([place]) 12/17/2014  . CVA (cerebral infarction) 12/17/2014    Collie Siad PT, DPT 12/29/2015, 9:32 AM  [place] MAIN [facility] SERVICES [address] [place], [place], [address] Phone: [phone]   Fax:  [phone]  Name: Kelli Gutierrez MRN: [id number] Date of Birth: Nov 27, 1950

## 2015-12-31 ENCOUNTER — Ambulatory Visit: Payer: Medicare Other | Admitting: Physical Therapy

## 2015-12-31 ENCOUNTER — Encounter: Payer: Self-pay | Admitting: Physical Therapy

## 2015-12-31 VITALS — BP 129/66 | HR 70

## 2015-12-31 DIAGNOSIS — M25512 Pain in left shoulder: Secondary | ICD-10-CM

## 2015-12-31 DIAGNOSIS — R293 Abnormal posture: Secondary | ICD-10-CM

## 2015-12-31 DIAGNOSIS — M25511 Pain in right shoulder: Secondary | ICD-10-CM

## 2015-12-31 NOTE — Therapy (Signed)
[place] MAIN [facility] SERVICES [address] [place], [place], [address] Phone: [phone]   Fax:  [phone]  Physical Therapy Treatment  Patient Details  Name: Kelli Gutierrez MRN: [id number] Date of Birth: 07-28-1950 Referring Provider: Melrose Nakayama  Encounter Date: 12/31/2015      PT End of Session - 12/31/15 0927    Visit Number 12   Number of Visits 21   Date for PT Re-Evaluation 01/21/16   Authorization Type 3/10   PT Start Time 0835   PT Stop Time 0922   PT Time Calculation (min) 47 min   Activity Tolerance Patient tolerated treatment well;Patient limited by pain   Behavior During Therapy [facility] for tasks assessed/performed      Past Medical History:  Diagnosis Date  . Chronic kidney disease 12/2015  . Diabetes mellitus without complication ([place])   . Hypertension   . Rheumatoid arthritis ([place])   . Stroke [facility])     Past Surgical History:  Procedure Laterality Date  . MIDDLE EAR SURGERY      Vitals:   12/31/15 0846  BP: 129/66  Pulse: 70  SpO2: 100%        Subjective Assessment - 12/31/15 0844    Subjective Pt reports no change in pain. Reports she continues to have difficulty with reaching in all directions. Reports she continues to have difficulties sleeping at night secondary to pain. Reports she continues to have bilateral shoulder pain and right elbow pain. Patient states relief in pain/sympotms after treatment session.     Pertinent History CVA 1999, CVA 12/2014, DM, HTN, last hemoglobin 8.1   Limitations Lifting;House hold activities   How long can you stand comfortably? 5 min   How long can you walk comfortably? 5 min   Diagnostic tests MRI in hospital   Patient Stated Goals decrease pain   Currently in Pain? No/denies   Pain Onset More than a month ago       TREATMENT  Manual:  Pt with Hx of rheumatoid arthritis so joint mobilizations limited to grade I-II;  Bilateral shoulder AP joint mobilizations with  distraction, grade I-II 30 seconds/bout x 3 bouts;  Bilateral superior to inferior joint mobs with distraction, grade I-II, 30 seconds/bout x 3 bouts in 90 deg abduction;  Thoracic CPAs grade I-II T1-7, 30 seconds/bout x2 bouts   Therapeutic Exercise:  Thoracic extension sitting in chair with back support and half foam roll 2 x 10  Push up plus on wall with cues for complete plus, 2x10  Diagonals with patient in sitting - D2 x 10 bilaterally with very light manual resistance. Cues for forward gaze and depressing shoulders.  Seated ball roll outs in sitting - x10                   PT Education - 12/31/15 0926    Education provided Yes   Education Details Reviewed self mobilization L shoulder inferior glide and encouraged pt to complete 3x/day.  Exercise technique.   Person(s) Educated Patient   Methods Explanation;Demonstration;Verbal cues;Tactile cues   Comprehension Verbalized understanding;Returned demonstration             PT Long Term Goals - 12/24/15 1236      PT LONG TERM GOAL #1   Title pt will be independent with HEP to promote recovery and decrease reinjury.   Time 6   Period Weeks   Status Achieved     PT LONG TERM GOAL #2   Title  Pt will not wake up at night due to pain   Baseline waking up 1x/night due to pain (12/24/15)   Time 4   Period Weeks   Status Revised     PT LONG TERM GOAL #3   Title pt will have improved bil shoulder flexion and abduction AROM to 150 deg to improve overall function with household chores.   Baseline R flex: 79 deg, abd: 66 deg;  L flex: 67 deg, abd: 75 deg----on 12/24/15 R flex: 137 deg, abd: 92 deg;  L flex: 101 deg, abd: 61 deg   Time 4   Period Weeks   Status Revised               Plan - 12/31/15 0850    Clinical Impression Statement Patient demonstrated improved overhead shoulder flexion in supine indicating functional carryover between visits. Pt required tactile and verbal cues for forward gaze and  correct posture with exercises today.  She continues to be limited by pain (L>R).  She reports that overall she feels stronger and is able to perform activities she could not before PT.  She will benefit from skilled PT for strengthening and posture.   Rehab Potential Fair   Clinical Impairments Affecting Rehab Potential potential difficulty with motivation; prior strokes, pain   PT Frequency 2x / week   PT Duration 6 weeks   PT Treatment/Interventions ADLs/Self Care Home Management;Electrical Stimulation;Moist Heat;Functional mobility training;Therapeutic activities;Therapeutic exercise;Neuromuscular re-education;Patient/family education;Manual techniques;Passive range of motion;Dry needling   PT Next Visit Plan Ball on wall stabilizing with circles; Functional resistance exercises, shoulder mobilizations (grade I-II), scapular stabilizatoin exercises, thoracic extension and postural correction   PT Home Exercise Plan Lumbar roll when sitting, biceps stretch in doorway, towel slides on wall, flexion and abduction 2 x 10, scap retraction and low rows with RTB 2 x 10 1x/day, self mobilization inferior glide to L shoulder   Consulted and Agree with Plan of Care Patient      Patient will benefit from skilled therapeutic intervention in order to improve the following deficits and impairments:  Decreased activity tolerance, Decreased mobility, Decreased range of motion, Decreased strength, Hypomobility, Increased muscle spasms, Impaired flexibility, Postural dysfunction, Improper body mechanics, Pain  Visit Diagnosis: Pain in right shoulder  Pain in left shoulder  Abnormal posture     Problem List Patient Active Problem List   Diagnosis Date Noted  . Acute CVA (cerebrovascular accident) ([place]) 12/17/2014  . CVA (cerebral infarction) 12/17/2014    Collie Siad PT, DPT 12/31/2015, 9:32 AM  [place] MAIN [facility] SERVICES [address] [place],  [place], [address] Phone: [phone]   Fax:  [phone]  Name: Kelli Gutierrez MRN: [id number] Date of Birth: 10-Aug-1950

## 2016-01-05 ENCOUNTER — Ambulatory Visit: Payer: Medicare Other

## 2016-01-05 VITALS — BP 126/86 | HR 78

## 2016-01-05 DIAGNOSIS — M25512 Pain in left shoulder: Secondary | ICD-10-CM

## 2016-01-05 DIAGNOSIS — M25511 Pain in right shoulder: Secondary | ICD-10-CM

## 2016-01-05 DIAGNOSIS — R293 Abnormal posture: Secondary | ICD-10-CM

## 2016-01-05 NOTE — Therapy (Signed)
[place] MAIN [facility] SERVICES [address] [place], [place], [address] Phone: [phone]   Fax:  [phone]  Physical Therapy Treatment  Patient Details  Name: [name] MRN: [id number] Date of Birth: 12/11/1950 Referring Provider: Melrose Nakayama  Encounter Date: 01/05/2016      PT End of Session - 01/05/16 0932    Visit Number 13   Number of Visits 21   Date for PT Re-Evaluation 01/21/16   Authorization Type 4/10   PT Start Time 0846   PT Stop Time 0928   PT Time Calculation (min) 42 min   Activity Tolerance Patient tolerated treatment well;Patient limited by pain   Behavior During Therapy [facility] for tasks assessed/performed      Past Medical History:  Diagnosis Date  . Chronic kidney disease 12/2015  . Diabetes mellitus without complication ([place])   . Hypertension   . Rheumatoid arthritis ([place])   . Stroke [facility])     Past Surgical History:  Procedure Laterality Date  . MIDDLE EAR SURGERY      Vitals:   01/05/16 0850  BP: 126/86  Pulse: 78        Subjective Assessment - 01/05/16 0853    Subjective Pt reports the pain is feeling about the same since the last visit. States her pain is a 5/10 at its worst and is having difficulty sleeping. Reports decreased pain after the treatment session.    Pertinent History CVA 1999, CVA 12/2014, DM, HTN, last hemoglobin 8.1   Limitations Lifting;House hold activities   How long can you stand comfortably? 5 min   How long can you walk comfortably? 5 min   Diagnostic tests MRI in hospital   Patient Stated Goals decrease pain   Currently in Pain? No/denies   Pain Onset More than a month ago      TREATMENT  Manual:  Pt with Hx of rheumatoid arthritis so joint mobilizations limited to grade I-II;  Bilateral shoulder AP joint mobilizations with distraction, grade I-II 30 seconds/bout x 3 bouts;  Bilateral superior to inferior joint mobs with distraction, grade I-II, 30 seconds/bout x 3 bouts in     Therapeutic Exercise:  Supine shoulder AAROM with bilateral shoulders - x 15 Serratus punches supine - x 5 - stopped due to pain Supine scapular retraction - x 15 Supine shoulder ER - x15, x 15 #1 Bilaterally Scapular retraction in sitting with manual assistance to achieve full AROM - x 15 Straight arm pull downs - x 20 5 lbs each side at [place] with straight bar        PT Education - 01/05/16 0936    Education provided Yes   Education Details Reviewed scapular retraction exercise and educated to perform throuhgout the day to decrease pain.   Person(s) Educated Patient   Methods Explanation;Demonstration   Comprehension Verbalized understanding;Returned demonstration             PT Long Term Goals - 12/24/15 1236      PT LONG TERM GOAL #1   Title pt will be independent with HEP to promote recovery and decrease reinjury.   Time 6   Period Weeks   Status Achieved     PT LONG TERM GOAL #2   Title Pt will not wake up at night due to pain   Baseline waking up 1x/night due to pain (12/24/15)   Time 4   Period Weeks   Status Revised     PT LONG TERM GOAL #  3   Title pt will have improved bil shoulder flexion and abduction AROM to 150 deg to improve overall function with household chores.   Baseline R flex: 79 deg, abd: 66 deg;  L flex: 67 deg, abd: 75 deg----on 12/24/15 R flex: 137 deg, abd: 92 deg;  L flex: 101 deg, abd: 61 deg   Time 4   Period Weeks   Status Revised               Plan - 01/05/16 0936    Clinical Impression Statement Patient demonstrates decrease of pain and symptoms after performing scapular retraction exercises indicating poor resting scapular positioning. Patient able to tolerate exercises performed without an increase in symptoms and will benefit from further skilled therapy focused on improving scapular positioning to return to prior level of function.    Rehab Potential Fair   Clinical Impairments Affecting Rehab Potential  potential difficulty with motivation; prior strokes, pain   PT Frequency 2x / week   PT Duration 6 weeks   PT Treatment/Interventions ADLs/Self Care Home Management;Electrical Stimulation;Moist Heat;Functional mobility training;Therapeutic activities;Therapeutic exercise;Neuromuscular re-education;Patient/family education;Manual techniques;Passive range of motion;Dry needling   PT Next Visit Plan Ball on wall stabilizing with circles; Functional resistance exercises, shoulder mobilizations (grade I-II), scapular stabilizatoin exercises, thoracic extension and postural correction   PT Home Exercise Plan Lumbar roll when sitting, biceps stretch in doorway, towel slides on wall, flexion and abduction 2 x 10, scap retraction and low rows with RTB 2 x 10 1x/day, self mobilization inferior glide to L shoulder   Consulted and Agree with Plan of Care Patient      Patient will benefit from skilled therapeutic intervention in order to improve the following deficits and impairments:  Decreased activity tolerance, Decreased mobility, Decreased range of motion, Decreased strength, Hypomobility, Increased muscle spasms, Impaired flexibility, Postural dysfunction, Improper body mechanics, Pain  Visit Diagnosis: Pain in right shoulder  Pain in left shoulder  Abnormal posture     Problem List Patient Active Problem List   Diagnosis Date Noted  . Acute CVA (cerebrovascular accident) ([place]) 12/17/2014  . CVA (cerebral infarction) 12/17/2014    Blythe Stanford, DPT PT 01/05/2016, 1:17 PM  [place] MAIN [facility] SERVICES [address] [place], [place], [address] Phone: [phone]   Fax:  [phone]  Name: Kelli Gutierrez MRN: [id number] Date of Birth: 1951/02/04

## 2016-01-07 ENCOUNTER — Ambulatory Visit: Payer: Medicare Other

## 2016-01-07 VITALS — BP 132/82 | HR 72

## 2016-01-07 DIAGNOSIS — G890 Central pain syndrome: Secondary | ICD-10-CM | POA: Insufficient documentation

## 2016-01-07 DIAGNOSIS — M25511 Pain in right shoulder: Secondary | ICD-10-CM

## 2016-01-07 DIAGNOSIS — G8929 Other chronic pain: Secondary | ICD-10-CM | POA: Insufficient documentation

## 2016-01-07 DIAGNOSIS — M25512 Pain in left shoulder: Secondary | ICD-10-CM

## 2016-01-07 DIAGNOSIS — R293 Abnormal posture: Secondary | ICD-10-CM

## 2016-01-07 DIAGNOSIS — Z8673 Personal history of transient ischemic attack (TIA), and cerebral infarction without residual deficits: Secondary | ICD-10-CM | POA: Insufficient documentation

## 2016-01-07 NOTE — Therapy (Signed)
[place] MAIN [facility] SERVICES [address] [place], [place], [address] Phone: [phone]   Fax:  [phone]  Physical Therapy Treatment  Patient Details  Name: Kelli Gutierrez MRN: [id number] Date of Birth: 16-Nov-1950 Referring Provider: Melrose Nakayama  Encounter Date: 01/07/2016      PT End of Session - 01/07/16 1220    Visit Number 14   Number of Visits 21   Date for PT Re-Evaluation 01/21/16   Authorization Type 5/10   PT Start Time 0847   PT Stop Time 0930   PT Time Calculation (min) 43 min   Activity Tolerance Patient tolerated treatment well;Patient limited by pain   Behavior During Therapy [facility] for tasks assessed/performed      Past Medical History:  Diagnosis Date  . Chronic kidney disease 12/2015  . Diabetes mellitus without complication ([place])   . Hypertension   . Rheumatoid arthritis ([place])   . Stroke [facility])     Past Surgical History:  Procedure Laterality Date  . MIDDLE EAR SURGERY      Vitals:   01/07/16 0859  BP: 132/82  Pulse: 72  SpO2: 98%        Subjective Assessment - 01/07/16 0849    Subjective Patient states the shoulder is not hurting as much today versus the previous visit. Reports the shoulder has not been hurting as much since the previous visit. Pt reports she's been performing HEP and is having a slight increase in pain with her scapular squeezes. Pt states she feels tired this morning   Pertinent History CVA 1999, CVA 12/2014, DM, HTN, last hemoglobin 8.1   Limitations Lifting;House hold activities   How long can you stand comfortably? 5 min   How long can you walk comfortably? 5 min   Diagnostic tests MRI in hospital   Patient Stated Goals decrease pain   Currently in Pain? No/denies   Pain Onset More than a month ago     TREATMENT:  Manual Therapy: Pt with Hx of rheumatoid arthritis so joint mobilizations limited to grade I-II;  Bilateral shoulder AP joint mobilizations with distraction, grade I-II  30 seconds/bout x 3 bouts;  Bilateral superior to inferior joint mobs with distraction, grade I-II, 30 seconds/bout x 3 bouts in    Therapeutic Exercise:  Supine shoulder AAROM with bilateral shoulders - x 15 Supine scapular retraction - 2x 10 with towels propping up left shoulder to increase joint space Supine shoulder ER - 2 x 15 #2 Bilaterally Scapular retraction at Matrix with assist to achieve full AROM - #5 x 15 Seated high row at Matrix 7.5# -- x15        PT Education - 01/07/16 1219    Education provided Yes   Education Details Educated on proper muscular activation when performing scapular retraction exercise to decrease pain and improve scapular setting   Person(s) Educated Patient   Methods Explanation;Demonstration   Comprehension Verbalized understanding;Returned demonstration;Verbal cues required;Tactile cues required             PT Long Term Goals - 12/24/15 1236      PT LONG TERM GOAL #1   Title pt will be independent with HEP to promote recovery and decrease reinjury.   Time 6   Period Weeks   Status Achieved     PT LONG TERM GOAL #2   Title Pt will not wake up at night due to pain   Baseline waking up 1x/night due to pain (12/24/15)   Time 4  Period Weeks   Status Revised     PT LONG TERM GOAL #3   Title pt will have improved bil shoulder flexion and abduction AROM to 150 deg to improve overall function with household chores.   Baseline R flex: 79 deg, abd: 66 deg;  L flex: 67 deg, abd: 75 deg----on 12/24/15 R flex: 137 deg, abd: 92 deg;  L flex: 101 deg, abd: 61 deg   Time 4   Period Weeks   Status Revised               Plan - 01/07/16 1221    Clinical Impression Statement Patient demonstrates significantly improved posture and AROM with scapular retraction exercises indicating functional carryover between visits. Although patient is improving she continues to demonstrate significant weakness and decreased shoulder AROM and will benefit  from further skilled therapy to address these deficits and return to prior level of function.    Rehab Potential Fair   Clinical Impairments Affecting Rehab Potential potential difficulty with motivation; prior strokes, pain   PT Frequency 2x / week   PT Duration 6 weeks   PT Treatment/Interventions ADLs/Self Care Home Management;Electrical Stimulation;Moist Heat;Functional mobility training;Therapeutic activities;Therapeutic exercise;Neuromuscular re-education;Patient/family education;Manual techniques;Passive range of motion;Dry needling   PT Next Visit Plan Ball on wall stabilizing with circles; Functional resistance exercises, shoulder mobilizations (grade I-II), scapular stabilizatoin exercises, thoracic extension and postural correction   PT Home Exercise Plan Lumbar roll when sitting, biceps stretch in doorway, towel slides on wall, flexion and abduction 2 x 10, scap retraction and low rows with RTB 2 x 10 1x/day, self mobilization inferior glide to L shoulder   Consulted and Agree with Plan of Care Patient      Patient will benefit from skilled therapeutic intervention in order to improve the following deficits and impairments:  Decreased activity tolerance, Decreased mobility, Decreased range of motion, Decreased strength, Hypomobility, Increased muscle spasms, Impaired flexibility, Postural dysfunction, Improper body mechanics, Pain  Visit Diagnosis: Pain in right shoulder  Pain in left shoulder  Abnormal posture     Problem List Patient Active Problem List   Diagnosis Date Noted  . Acute CVA (cerebrovascular accident) ([place]) 12/17/2014  . CVA (cerebral infarction) 12/17/2014    Blythe Stanford, PT DPT 01/07/2016, 12:25 PM  [place] MAIN [facility] SERVICES [address] [place], [place], [address] Phone: [phone]   Fax:  [phone]  Name: Kelli Gutierrez MRN: [id number] Date of Birth: [birth year]

## 2016-01-12 ENCOUNTER — Ambulatory Visit: Payer: Medicare Other

## 2016-01-14 ENCOUNTER — Ambulatory Visit: Payer: Medicare Other | Attending: Neurology

## 2016-01-14 ENCOUNTER — Ambulatory Visit: Payer: Medicare Other

## 2016-01-14 VITALS — BP 112/61 | HR 72

## 2016-01-14 DIAGNOSIS — R293 Abnormal posture: Secondary | ICD-10-CM | POA: Diagnosis present

## 2016-01-14 DIAGNOSIS — M25512 Pain in left shoulder: Secondary | ICD-10-CM | POA: Insufficient documentation

## 2016-01-14 DIAGNOSIS — G8929 Other chronic pain: Secondary | ICD-10-CM | POA: Diagnosis present

## 2016-01-14 DIAGNOSIS — M25511 Pain in right shoulder: Secondary | ICD-10-CM | POA: Diagnosis not present

## 2016-01-14 NOTE — Therapy (Signed)
[place] MAIN [facility] SERVICES [address] [place], [place], [address] Phone: [phone]   Fax:  [phone]  Physical Therapy Treatment  Patient Details  Name: Kelli Gutierrez MRN: [id number] Date of Birth: 03/28/1951 Referring Provider: Melrose Nakayama  Encounter Date: 01/14/2016      PT End of Session - 01/14/16 1026    Visit Number 15   Number of Visits 21   Date for PT Re-Evaluation 01/21/16   Authorization Type 6/10   PT Start Time 0952   PT Stop Time 1030   PT Time Calculation (min) 38 min   Activity Tolerance Patient tolerated treatment well;Patient limited by pain   Behavior During Therapy [facility] for tasks assessed/performed      Past Medical History:  Diagnosis Date  . Chronic kidney disease 12/2015  . Diabetes mellitus without complication ([place])   . Hypertension   . Rheumatoid arthritis ([place])   . Stroke [facility])     Past Surgical History:  Procedure Laterality Date  . MIDDLE EAR SURGERY      Vitals:   01/14/16 0958  BP: 112/61  Pulse: 72        Subjective Assessment - 01/14/16 0958    Subjective Patient states increased shoulder pain today which started increasing three days ago. Patient reports she has not been able to lift her pocketbook secondary to increased pain.    Pertinent History CVA 1999, CVA 12/2014, DM, HTN, last hemoglobin 8.1   Limitations Lifting;House hold activities   How long can you stand comfortably? 5 min   How long can you walk comfortably? 5 min   Diagnostic tests MRI in hospital   Patient Stated Goals decrease pain   Pain Onset More than a month ago      TREATMENT:  Manual Therapy: Pt with Hx of rheumatoid arthritis so joint mobilizations limited to grade I-II;  Bilateral shoulder AP joint mobilizations with distraction, grade I-II 30 seconds/bout x 3 bouts;  Bilateral superior to inferior joint mobs with distraction, grade I-II, 30 seconds/bout x 3 bouts in    Therapeutic Exercise:  Standing  shoulder flexion with physioball at the wall- x 15 Sitting scapular retraction - x15 with no resistance for motor control  Seated Scapular retraction at Matrix with assist to achieve full AROM - #5 2 x 15 Seated high row at Matrix 7.5# -- 2 x15 Straight arm pull downs at Matrix - 2 x 15 Self-inferior shoulder mobilization while grabbing bottom of the chair-2 x 30 sec each side  * Patient reports no pain after exercise and manual therapy performance         PT Education - 01/14/16 1023    Education provided Yes   Education Details Educated on proper muscular activation of scapular retractors to decrease pain.   Person(s) Educated Patient   Methods Explanation;Demonstration   Comprehension Verbalized understanding;Returned demonstration             PT Long Term Goals - 12/24/15 1236      PT LONG TERM GOAL #1   Title pt will be independent with HEP to promote recovery and decrease reinjury.   Time 6   Period Weeks   Status Achieved     PT LONG TERM GOAL #2   Title Pt will not wake up at night due to pain   Baseline waking up 1x/night due to pain (12/24/15)   Time 4   Period Weeks   Status Revised     PT LONG  TERM GOAL #3   Title pt will have improved bil shoulder flexion and abduction AROM to 150 deg to improve overall function with household chores.   Baseline R flex: 79 deg, abd: 66 deg;  L flex: 67 deg, abd: 75 deg----on 12/24/15 R flex: 137 deg, abd: 92 deg;  L flex: 101 deg, abd: 61 deg   Time 4   Period Weeks   Status Revised               Plan - 01/14/16 1027    Clinical Impression Statement Patient with decrease in resting pain after performing manual therapy and focusing on therapeutic exercise that promotes scapular retraction. This indicates both decreased motor control and muscular endurance of the scapular/cervical stabilization musculature and patient will benefit from further skilled therapy to decrease resting pain and improve scapular  stabilization.    Rehab Potential Fair   Clinical Impairments Affecting Rehab Potential potential difficulty with motivation; prior strokes, pain   PT Frequency 2x / week   PT Duration 6 weeks   PT Treatment/Interventions ADLs/Self Care Home Management;Electrical Stimulation;Moist Heat;Functional mobility training;Therapeutic activities;Therapeutic exercise;Neuromuscular re-education;Patient/family education;Manual techniques;Passive range of motion;Dry needling   PT Next Visit Plan Ball on wall stabilizing with circles; Functional resistance exercises, shoulder mobilizations (grade I-II), scapular stabilizatoin exercises, thoracic extension and postural correction   PT Home Exercise Plan Lumbar roll when sitting, biceps stretch in doorway, towel slides on wall, flexion and abduction 2 x 10, scap retraction and low rows with RTB 2 x 10 1x/day, self mobilization inferior glide to L shoulder   Consulted and Agree with Plan of Care Patient      Patient will benefit from skilled therapeutic intervention in order to improve the following deficits and impairments:  Decreased activity tolerance, Decreased mobility, Decreased range of motion, Decreased strength, Hypomobility, Increased muscle spasms, Impaired flexibility, Postural dysfunction, Improper body mechanics, Pain  Visit Diagnosis: Chronic right shoulder pain  Chronic left shoulder pain  Abnormal posture     Problem List Patient Active Problem List   Diagnosis Date Noted  . Acute CVA (cerebrovascular accident) ([place]) 12/17/2014  . CVA (cerebral infarction) 12/17/2014    Blythe Stanford, PT DPT 01/14/2016, 1:54 PM  [place] MAIN [facility] SERVICES [address] [place], [place], [address] Phone: [phone]   Fax:  [phone]  Name: Kelli Gutierrez MRN: [id number] Date of Birth: 09-21-1950

## 2016-01-19 ENCOUNTER — Ambulatory Visit: Payer: Medicare Other

## 2016-01-26 ENCOUNTER — Ambulatory Visit: Payer: Medicare Other

## 2016-01-26 DIAGNOSIS — R293 Abnormal posture: Secondary | ICD-10-CM

## 2016-01-26 DIAGNOSIS — M25511 Pain in right shoulder: Principal | ICD-10-CM

## 2016-01-26 DIAGNOSIS — G8929 Other chronic pain: Secondary | ICD-10-CM

## 2016-01-26 DIAGNOSIS — M25512 Pain in left shoulder: Secondary | ICD-10-CM

## 2016-01-26 NOTE — Therapy (Addendum)
[name] [facility] SERVICES [address] [place], [place], [address] Phone: [phone]   Fax:  [phone]  Physical Therapy Treatment  Patient Details  Name: Kelli Gutierrez MRN: [id number] Date of Birth: 06/28/1950 Referring Provider: Melrose Nakayama  Encounter Date: 01/26/2016      PT End of Session - 01/26/16 1333    Visit Number 16   Number of Visits 29   Date for PT Re-Evaluation 10113/17   Authorization Type 7/10   PT Start Time 0848   PT Stop Time 0930   PT Time Calculation (min) 42 min   Activity Tolerance Patient tolerated treatment well;Patient limited by pain   Behavior During Therapy [facility] for tasks assessed/performed      Past Medical History:  Diagnosis Date  . Chronic kidney disease 12/2015  . Diabetes mellitus without complication ([place])   . Hypertension   . Rheumatoid arthritis ([place])   . Stroke [facility])     Past Surgical History:  Procedure Laterality Date  . MIDDLE EAR SURGERY      There were no vitals filed for this visit.      Subjective Assessment - 01/26/16 1326    Subjective Patient states continued increased shoulder pain. Pt reports pt is not getting any better but is able to perform function activites with less difficulty. Patient reports she has not gone to her appointments secondary to increased shoulder pain.    Pertinent History CVA 1999, CVA 12/2014, DM, HTN, last hemoglobin 8.1   Limitations Lifting;House hold activities   How long can you stand comfortably? 5 min   How long can you walk comfortably? 5 min   Diagnostic tests MRI in hospital   Patient Stated Goals decrease pain   Pain Onset More than a month ago      OBSERVATION:  L Shoulder flexion: 90 L shoulder abduction 70 R Shoulder flexion: 130 R shoulder abduction 100  TREATMENT:  Manual Therapy: Pt with Hx of rheumatoid arthritis so joint mobilizations limited to grade I-II;  Bilateral shoulder AP joint mobilizations with distraction,  grade I-II 30 seconds/bout x 3 bouts;  Bilateral superior to inferior joint mobs with distraction, grade I-II, 30 seconds/bout x 3 bouts in  STM to cervical extensors and upper traps bilaterally utilizing superficial techniques to decrease pain and spasms   Therapeutic Exercise:  Sitting scapular retraction - x30 with no resistance for motor control  Standing Scapular retraction at Matrix with assist to achieve full AROM - #10 2 x 15 Seated high row at Matrix 7.5# -- x15 performed throughout shorter range secondary to  Isometrics on the wall ER/IR -- 5 sec hold x5 Seated Thoracic flexion to extension - x15 with tactile cueing to encourage ext   * Patient reports no pain after exercise and manual therapy performance       PT Education - 01/26/16 1333    Education provided Yes   Education Details Educated heavily on pain science    Person(s) Educated Patient   Methods Explanation;Demonstration   Comprehension Verbalized understanding;Returned demonstration             PT Long Term Goals - 01/26/16 1338      PT LONG TERM GOAL #1   Title pt will be independent with HEP to promote recovery and decrease reinjury.   Baseline Requires cueing on shoulder positioning   Time 6   Period Weeks   Status On-going     PT LONG TERM GOAL #2   Title  Pt will not wake up at night due to pain   Baseline waking up 1x/night due to pain (12/24/15) (01/26/16) Sleep heavily impaired secondary to pain   Time 4   Period Weeks   Status On-going     PT LONG TERM GOAL #3   Title pt will have improved bil shoulder flexion and abduction AROM to 150 deg to improve overall function with household chores.   Baseline R flex: 79 deg, abd: 66 deg;  L flex: 67 deg, abd: 75 deg----on 12/24/15 R flex: 137 deg, abd: 92 deg;  L flex: 101 deg, abd: 61 deg ; on 12/24/15 R flex: 137 deg, abd: 92 deg;  01/26/16: L flex: 90 deg, abd: 70 deg  R Flex: 130, R Abduction: 100   Time 4   Period Weeks   Status Revised                Plan - 01/26/16 1334    Clinical Impression Statement Pt demonstrates decreased pain after performing manual therapy and therapeutic exercise. Patient demonstrates decreased shoulder AROM compared to previous visits secondary to missing 2 weeks of appoints secondary to sickness. Patient continues to require frequent cueing on scapular setting when at rest and during exercise performance. Patient continues to demonstrates increased muscular weakness and will benefit from further skilled therapy to return to prior level of function.    Rehab Potential Fair   Clinical Impairments Affecting Rehab Potential potential difficulty with motivation; prior strokes, pain   PT Frequency 2x / week   PT Duration 6 weeks   PT Treatment/Interventions ADLs/Self Care Home Management;Electrical Stimulation;Moist Heat;Functional mobility training;Therapeutic activities;Therapeutic exercise;Neuromuscular re-education;Patient/family education;Manual techniques;Passive range of motion;Dry needling   PT Next Visit Plan Ball on wall stabilizing with circles; Functional resistance exercises, shoulder mobilizations (grade I-II), scapular stabilizatoin exercises, thoracic extension and postural correction   PT Home Exercise Plan Lumbar roll when sitting, biceps stretch in doorway, towel slides on wall, flexion and abduction 2 x 10, scap retraction and low rows with RTB 2 x 10 1x/day, self mobilization inferior glide to L shoulder   Consulted and Agree with Plan of Care Patient      Patient will benefit from skilled therapeutic intervention in order to improve the following deficits and impairments:  Decreased activity tolerance, Decreased mobility, Decreased range of motion, Decreased strength, Hypomobility, Increased muscle spasms, Impaired flexibility, Postural dysfunction, Improper body mechanics, Pain  Visit Diagnosis: Chronic right shoulder pain  Chronic left shoulder pain  Abnormal  posture     Problem List Patient Active Problem List   Diagnosis Date Noted  . Acute CVA (cerebrovascular accident) ([place]) 12/17/2014  . CVA (cerebral infarction) 12/17/2014    Blythe Stanford, PT DPT 01/26/2016, 1:42 PM  [place] MAIN [facility] SERVICES [address] [place], [place], [address] Phone: [phone]   Fax:  [phone]  Name: Kelli Gutierrez MRN: [id number] Date of Birth: January 29, 1951

## 2016-01-28 ENCOUNTER — Ambulatory Visit: Payer: Medicare Other

## 2016-01-28 DIAGNOSIS — G8929 Other chronic pain: Secondary | ICD-10-CM

## 2016-01-28 DIAGNOSIS — M25512 Pain in left shoulder: Secondary | ICD-10-CM

## 2016-01-28 DIAGNOSIS — M25511 Pain in right shoulder: Secondary | ICD-10-CM | POA: Diagnosis not present

## 2016-01-28 DIAGNOSIS — R293 Abnormal posture: Secondary | ICD-10-CM

## 2016-01-28 NOTE — Therapy (Signed)
[place] MAIN [facility] SERVICES [address] [place], [place], [address] Phone: [phone]   Fax:  [phone]  Physical Therapy Treatment  Patient Details  Name: Kelli Gutierrez MRN: [id number] Date of Birth: [birth year] Referring Provider: Melrose Nakayama  Encounter Date: 01/28/2016      PT End of Session - 01/28/16 0922    Visit Number 17   Number of Visits 21   Date for PT Re-Evaluation 02/23/16   Authorization Type 8/10   PT Start Time 0848   PT Stop Time 0930   PT Time Calculation (min) 42 min   Activity Tolerance Patient tolerated treatment well;Patient limited by pain   Behavior During Therapy [facility] for tasks assessed/performed      Past Medical History:  Diagnosis Date  . Chronic kidney disease 12/2015  . Diabetes mellitus without complication ([place])   . Hypertension   . Rheumatoid arthritis ([place])   . Stroke [facility])     Past Surgical History:  Procedure Laterality Date  . MIDDLE EAR SURGERY      There were no vitals filed for this visit.      Subjective Assessment - 01/28/16 0910    Subjective Patient continues to have sholder pain but it is decreased compared to her previous visit. Patient reports shoulder started feeling better after last treatment.    Pertinent History CVA 1999, CVA 12/2014, DM, HTN, last hemoglobin 8.1   Limitations Lifting;House hold activities   How long can you stand comfortably? 5 min   How long can you walk comfortably? 5 min   Diagnostic tests MRI in hospital   Patient Stated Goals decrease pain   Pain Onset More than a month ago      TREATMENT:  Manual Therapy: Pt with Hx of rheumatoid arthritis so joint mobilizations limited to grade I-II;  Bilateral shoulder AP joint mobilizations with distraction, grade I-II 30 seconds/bout x 3 bouts;  Bilateral superior to inferior joint mobs with distraction, grade I-II, 30 seconds/bout x 3 bouts in  STM to cervical extensors and upper traps bilaterally utilizing  superficial techniques to decrease pain and spasms   Therapeutic Exercise:  Supine shoulder AROM - x20  to forehead Sitting scapular retraction - x30 with no resistance for motor control  Push ups against wall - 2 x 20  Standing Scapular retraction at Matrix with assist to achieve full AROM - #10 2 x 20 Seated high row at Matrix 10# -- x15 performed throughout shorter range secondary to  Seated Thoracic flexion to extension - x15 with tactile cueing to encourage ext   * Patient reports no pain after exercise and manual therapy performance       PT Education - 01/28/16 0918    Education provided Yes   Education Details Educated on scapular positioning throughout exercise   Person(s) Educated Patient   Methods Explanation;Demonstration   Comprehension Verbalized understanding;Returned demonstration             PT Long Term Goals - 01/26/16 1338      PT LONG TERM GOAL #1   Title pt will be independent with HEP to promote recovery and decrease reinjury.   Baseline Requires cueing on shoulder positioning   Time 6   Period Weeks   Status On-going     PT LONG TERM GOAL #2   Title Pt will not wake up at night due to pain   Baseline waking up 1x/night due to pain (12/24/15) (01/26/16) Sleep heavily impaired secondary to  pain   Time 4   Period Weeks   Status On-going     PT LONG TERM GOAL #3   Title pt will have improved bil shoulder flexion and abduction AROM to 150 deg to improve overall function with household chores.   Baseline R flex: 79 deg, abd: 66 deg;  L flex: 67 deg, abd: 75 deg----on 12/24/15 R flex: 137 deg, abd: 92 deg;  L flex: 101 deg, abd: 61 deg ; on 12/24/15 R flex: 137 deg, abd: 92 deg;  01/26/16: L flex: 90 deg, abd: 70 deg  R Flex: 130, R Abduction: 100   Time 4   Period Weeks   Status Revised               Plan - 01/28/16 1416    Clinical Impression Statement Pt demonstrates decreased resting pain and improved thoracic and scapular mobility  indicating functional carryover between sessions. Patient continues to require increased cueing to perform exercises with proper technique/mobility. Patient will benefit from further skilled therapy focused on improving limitations to return to prior level of function.    Rehab Potential Fair   Clinical Impairments Affecting Rehab Potential potential difficulty with motivation; prior strokes, pain   PT Frequency 2x / week   PT Duration 6 weeks   PT Treatment/Interventions ADLs/Self Care Home Management;Electrical Stimulation;Moist Heat;Functional mobility training;Therapeutic activities;Therapeutic exercise;Neuromuscular re-education;Patient/family education;Manual techniques;Passive range of motion;Dry needling   PT Next Visit Plan Ball on wall stabilizing with circles; Functional resistance exercises, shoulder mobilizations (grade I-II), scapular stabilizatoin exercises, thoracic extension and postural correction   PT Home Exercise Plan Lumbar roll when sitting, biceps stretch in doorway, towel slides on wall, flexion and abduction 2 x 10, scap retraction and low rows with RTB 2 x 10 1x/day, self mobilization inferior glide to L shoulder   Consulted and Agree with Plan of Care Patient      Patient will benefit from skilled therapeutic intervention in order to improve the following deficits and impairments:  Decreased activity tolerance, Decreased mobility, Decreased range of motion, Decreased strength, Hypomobility, Increased muscle spasms, Impaired flexibility, Postural dysfunction, Improper body mechanics, Pain  Visit Diagnosis: Chronic right shoulder pain  Chronic left shoulder pain  Abnormal posture     Problem List Patient Active Problem List   Diagnosis Date Noted  . Acute CVA (cerebrovascular accident) ([place]) 12/17/2014  . CVA (cerebral infarction) 12/17/2014    Blythe Stanford, PT DPT 01/28/2016, 2:19 PM  [place] MAIN [facility]  SERVICES [address] [place], [place], [address] Phone: [phone]   Fax:  [phone]  Name: Kelli Gutierrez MRN: [id number] Date of Birth: [birth year]

## 2016-02-02 ENCOUNTER — Ambulatory Visit: Payer: Medicare Other

## 2016-02-02 DIAGNOSIS — M25512 Pain in left shoulder: Secondary | ICD-10-CM

## 2016-02-02 DIAGNOSIS — G8929 Other chronic pain: Secondary | ICD-10-CM

## 2016-02-02 DIAGNOSIS — R293 Abnormal posture: Secondary | ICD-10-CM

## 2016-02-02 DIAGNOSIS — M25511 Pain in right shoulder: Principal | ICD-10-CM

## 2016-02-02 NOTE — Therapy (Signed)
[place] MAIN [facility] SERVICES [address] [place], [place], [address] Phone: [phone]   Fax:  [phone]  Physical Therapy Treatment  Patient Details  Name: Kelli Gutierrez MRN: [id number] Date of Birth: [birth year] Referring Provider: Melrose Nakayama  Encounter Date: 02/02/2016      PT End of Session - 02/02/16 0927    Visit Number 18   Number of Visits 21   Date for PT Re-Evaluation 02/23/16   Authorization Type 9/10   PT Start Time 0846   PT Stop Time 0928   PT Time Calculation (min) 42 min   Activity Tolerance Patient tolerated treatment well;Patient limited by pain   Behavior During Therapy [facility] for tasks assessed/performed      Past Medical History:  Diagnosis Date  . Chronic kidney disease 12/2015  . Diabetes mellitus without complication ([place])   . Hypertension   . Rheumatoid arthritis ([place])   . Stroke [facility])     Past Surgical History:  Procedure Laterality Date  . MIDDLE EAR SURGERY      There were no vitals filed for this visit.      Subjective Assessment - 02/02/16 0849    Subjective Patient reports increased shoulder pain secondary to the rain and low pressure system outside. Patient reports minimal relief in shoulder pain after last treatment.    Pertinent History CVA 1999, CVA 12/2014, DM, HTN, last hemoglobin 8.1   Limitations Lifting;House hold activities   How long can you stand comfortably? 5 min   How long can you walk comfortably? 5 min   Diagnostic tests MRI in hospital   Patient Stated Goals decrease pain   Pain Score 8    Pain Onset More than a month ago      TREATMENT:  Manual Therapy: Pt with Hx of rheumatoid arthritis so joint mobilizations limited to grade I-II;  Bilateral shoulder AP joint mobilizations with distraction, grade I-II 30 seconds/bout x 3 bouts;  Bilateral superior to inferior joint mobs with distraction, grade I-II, 30 seconds/bout x 3 bouts in    Therapeutic Exercise:  Supine shoulder  AROM - x5 to forehead stopped secondary to pain  Standing with UE ranger on floor flexion/ext, medial/lateral rotation - x15  Standing Scapular retraction at Matrix with assist to achieve full AROM - #10 2 x 20 Seated high row at Matrix 10# -- x15 performed throughout shorter range secondary to  Seated Thoracic flexion to extension - x15 with tactile cueing to encourage ext   * Patient reports no pain after exercise and manual therapy performance        PT Education - 02/02/16 0926    Education provided Yes   Education Details Educated on scapular positioning   Person(s) Educated Patient   Methods Explanation;Demonstration   Comprehension Verbalized understanding;Returned demonstration             PT Long Term Goals - 01/26/16 1338      PT LONG TERM GOAL #1   Title pt will be independent with HEP to promote recovery and decrease reinjury.   Baseline Requires cueing on shoulder positioning   Time 6   Period Weeks   Status On-going     PT LONG TERM GOAL #2   Title Pt will not wake up at night due to pain   Baseline waking up 1x/night due to pain (12/24/15) (01/26/16) Sleep heavily impaired secondary to pain   Time 4   Period Weeks   Status On-going  PT LONG TERM GOAL #3   Title pt will have improved bil shoulder flexion and abduction AROM to 150 deg to improve overall function with household chores.   Baseline R flex: 79 deg, abd: 66 deg;  L flex: 67 deg, abd: 75 deg----on 12/24/15 R flex: 137 deg, abd: 92 deg;  L flex: 101 deg, abd: 61 deg ; on 12/24/15 R flex: 137 deg, abd: 92 deg;  01/26/16: L flex: 90 deg, abd: 70 deg  R Flex: 130, R Abduction: 100   Time 4   Period Weeks   Status Revised               Plan - 02/02/16 0045    Clinical Impression Statement Pt demonstrates decreased pain after performing UE ranger exercises in a pain free range indicating improved muscular coordination and motor control. Patient was able to perform exercises without pain  post AROM exercises and pt will benefit from further skilled therapy to return to prior level of function.     Rehab Potential Fair   Clinical Impairments Affecting Rehab Potential potential difficulty with motivation; prior strokes, pain   PT Frequency 2x / week   PT Duration 6 weeks   PT Treatment/Interventions ADLs/Self Care Home Management;Electrical Stimulation;Moist Heat;Functional mobility training;Therapeutic activities;Therapeutic exercise;Neuromuscular re-education;Patient/family education;Manual techniques;Passive range of motion;Dry needling   PT Next Visit Plan Ball on wall stabilizing with circles; Functional resistance exercises, shoulder mobilizations (grade I-II), scapular stabilizatoin exercises, thoracic extension and postural correction   PT Home Exercise Plan Lumbar roll when sitting, biceps stretch in doorway, towel slides on wall, flexion and abduction 2 x 10, scap retraction and low rows with RTB 2 x 10 1x/day, self mobilization inferior glide to L shoulder   Consulted and Agree with Plan of Care Patient      Patient will benefit from skilled therapeutic intervention in order to improve the following deficits and impairments:  Decreased activity tolerance, Decreased mobility, Decreased range of motion, Decreased strength, Hypomobility, Increased muscle spasms, Impaired flexibility, Postural dysfunction, Improper body mechanics, Pain  Visit Diagnosis: Chronic right shoulder pain  Chronic left shoulder pain  Abnormal posture     Problem List Patient Active Problem List   Diagnosis Date Noted  . Acute CVA (cerebrovascular accident) ([place]) 12/17/2014  . CVA (cerebral infarction) 12/17/2014    Blythe Stanford, PT DPT 02/02/2016, 1:19 PM  [place] MAIN [facility] SERVICES [address] [place], [place], [address] Phone: [phone]   Fax:  [phone]  Name: Kelli Gutierrez MRN: [id number] Date of Birth: 02-12-1951

## 2016-02-04 ENCOUNTER — Ambulatory Visit: Payer: Medicare Other

## 2016-02-04 DIAGNOSIS — M25512 Pain in left shoulder: Secondary | ICD-10-CM

## 2016-02-04 DIAGNOSIS — R293 Abnormal posture: Secondary | ICD-10-CM

## 2016-02-04 DIAGNOSIS — M25511 Pain in right shoulder: Principal | ICD-10-CM

## 2016-02-04 DIAGNOSIS — G8929 Other chronic pain: Secondary | ICD-10-CM

## 2016-02-04 NOTE — Therapy (Signed)
[place] MAIN [facility] SERVICES [address] [place], [place], [address] Phone: [phone]   Fax:  [phone]  Physical Therapy Treatment  Patient Details  Name: Kelli Gutierrez MRN: [id number] Date of Birth: 09/23/1950 Referring Provider: Melrose Nakayama  Encounter Date: 02/04/2016      PT End of Session - 02/04/16 0921    Visit Number 19   Number of Visits 21   Date for PT Re-Evaluation 02/23/16   Authorization Type 10/10   PT Start Time 0846   PT Stop Time 0930   PT Time Calculation (min) 44 min   Activity Tolerance Patient tolerated treatment well;Patient limited by pain   Behavior During Therapy [facility] for tasks assessed/performed      Past Medical History:  Diagnosis Date  . Chronic kidney disease 12/2015  . Diabetes mellitus without complication ([place])   . Hypertension   . Rheumatoid arthritis ([place])   . Stroke [facility])     Past Surgical History:  Procedure Laterality Date  . MIDDLE EAR SURGERY      There were no vitals filed for this visit.      Subjective Assessment - 02/04/16 0918    Subjective Patient report she continues to have increased pain in the left shoulder but the pain is improved after performing exercises.    Pertinent History CVA 1999, CVA 12/2014, DM, HTN, last hemoglobin 8.1   How long can you stand comfortably? 5 min   How long can you walk comfortably? 5 min   Diagnostic tests MRI in hospital   Patient Stated Goals decrease pain   Currently in Pain? No/denies      TREATMENT:  Manual Therapy: Pt with Hx of rheumatoid arthritis so joint mobilizations limited to grade I-II;  Bilateral shoulder AP joint mobilizations with distraction, grade I-II 30 seconds/bout x 3 bouts;  Bilateral superior to inferior joint mobs with distraction, grade I-II, 30 seconds/bout x 3 bouts in    Therapeutic Exercise:  Supine shoulder AROM - x5 to forehead stopped secondary to pain  Scapular retraction in prone - x20 with manual  cueing and assist to achieve full AROM.  Standing with UE ranger on floor flexion/ext, medial/lateral rotation - x15  Standing Scapular retraction at Matrix with assist to achieve full AROM - #10 2 x 20 Seated high row at Matrix 10# -- x15 performed throughout shorter range secondary to    * Patient reports no pain after exercise and manual therapy performance         PT Education - 02/04/16 1212    Education provided Yes   Education Details HEP: pain free shoulder mobility forward/back, left/right   Person(s) Educated Patient   Methods Explanation;Demonstration   Comprehension Verbalized understanding;Returned demonstration             PT Long Term Goals - 02/04/16 0925      PT LONG TERM GOAL #1   Title pt will be independent with HEP to promote recovery and decrease reinjury.   Baseline Requires cueing on shoulder positioning   Time 6   Period Weeks   Status On-going     PT LONG TERM GOAL #2   Title Pt will not wake up at night due to pain   Baseline waking up 1x/night due to pain (12/24/15) (01/26/16) Sleep heavily impaired secondary to pain   Time 4   Period Weeks   Status On-going     PT LONG TERM GOAL #3   Title pt will have  improved bil shoulder flexion and abduction AROM to 150 deg to improve overall function with household chores.   Baseline R flex: 79 deg, abd: 66 deg;  L flex: 67 deg, abd: 75 deg----on 12/24/15 R flex: 137 deg, abd: 92 deg;  L flex: 101 deg, abd: 61 deg ; on 12/24/15 R flex: 137 deg, abd: 92 deg;  01/26/16: L flex: 90 deg, abd: 70 deg  R Flex: 130, R Abduction: 100   Time 4   Period Weeks   Status Revised               Plan - 04-Mar-2016 1214    Clinical Impression Statement Pt continues to demonstrate decreased pain after AROM exercises indicating improved motor control/coordination. However, patient demonstrates poor motor control and functional carryover between visits and will benefit from further skilled therapy to return to prior  level of function.    Rehab Potential Fair   Clinical Impairments Affecting Rehab Potential potential difficulty with motivation; prior strokes, pain   PT Frequency 2x / week   PT Duration 6 weeks   PT Treatment/Interventions ADLs/Self Care Home Management;Electrical Stimulation;Moist Heat;Functional mobility training;Therapeutic activities;Therapeutic exercise;Neuromuscular re-education;Patient/family education;Manual techniques;Passive range of motion;Dry needling   PT Next Visit Plan Ball on wall stabilizing with circles; Functional resistance exercises, shoulder mobilizations (grade I-II), scapular stabilizatoin exercises, thoracic extension and postural correction   PT Home Exercise Plan Lumbar roll when sitting, biceps stretch in doorway, towel slides on wall, flexion and abduction 2 x 10, scap retraction and low rows with RTB 2 x 10 1x/day, self mobilization inferior glide to L shoulder   Consulted and Agree with Plan of Care Patient      Patient will benefit from skilled therapeutic intervention in order to improve the following deficits and impairments:  Decreased activity tolerance, Decreased mobility, Decreased range of motion, Decreased strength, Hypomobility, Increased muscle spasms, Impaired flexibility, Postural dysfunction, Improper body mechanics, Pain  Visit Diagnosis: Chronic right shoulder pain  Chronic left shoulder pain  Abnormal posture       G-Codes - 2016/03/04 08/01/16    Functional Assessment Tool Used QuickDASH, clinical judgement, MMT, goniometry   Functional Limitation Carrying, moving and handling objects   Mobility: Walking and Moving Around Current Status (A1937) At least 20 percent but less than 40 percent impaired, limited or restricted   Mobility: Walking and Moving Around Goal Status [phone]) At least 1 percent but less than 20 percent impaired, limited or restricted      Problem List Patient Active Problem List   Diagnosis Date Noted  . Acute CVA  (cerebrovascular accident) ([place]) 12/17/2014  . CVA (cerebral infarction) 12/17/2014    Blythe Stanford, PT DPT Mar 04, 2016, 12:19 PM  [place] MAIN [facility] SERVICES [address] [place], [place], [address] Phone: [phone]   Fax:  [phone]  Name: Kelli Gutierrez MRN: [id number] Date of Birth: 11-02-1950

## 2016-02-10 ENCOUNTER — Ambulatory Visit: Payer: Medicare Other

## 2016-02-12 ENCOUNTER — Ambulatory Visit: Payer: Medicare Other | Attending: Neurology

## 2016-02-12 DIAGNOSIS — R293 Abnormal posture: Secondary | ICD-10-CM | POA: Diagnosis present

## 2016-02-12 DIAGNOSIS — M25511 Pain in right shoulder: Secondary | ICD-10-CM | POA: Insufficient documentation

## 2016-02-12 DIAGNOSIS — G8929 Other chronic pain: Secondary | ICD-10-CM | POA: Diagnosis present

## 2016-02-12 DIAGNOSIS — M25512 Pain in left shoulder: Secondary | ICD-10-CM | POA: Insufficient documentation

## 2016-02-12 DIAGNOSIS — R531 Weakness: Secondary | ICD-10-CM | POA: Insufficient documentation

## 2016-02-12 NOTE — Therapy (Signed)
[place] MAIN [facility] SERVICES [address] [place], [place], [address] Phone: [phone]   Fax:  [phone]  Physical Therapy Treatment  Patient Details  Name: Kelli Gutierrez MRN: [id number] Date of Birth: 02/25/1951 Referring Provider: Melrose Nakayama  Encounter Date: 02/12/2016      PT End of Session - 02/12/16 0825    Visit Number 20   Number of Visits 21   Date for PT Re-Evaluation 02/23/16   Authorization Type 1/10   PT Start Time 0806   PT Stop Time 0845   PT Time Calculation (min) 39 min   Activity Tolerance Patient tolerated treatment well;Patient limited by pain   Behavior During Therapy [facility] for tasks assessed/performed      Past Medical History:  Diagnosis Date  . Chronic kidney disease 12/2015  . Diabetes mellitus without complication ([place])   . Hypertension   . Rheumatoid arthritis ([place])   . Stroke [facility])     Past Surgical History:  Procedure Laterality Date  . MIDDLE EAR SURGERY      There were no vitals filed for this visit.      Subjective Assessment - 02/12/16 0810    Subjective Patient reports she continues to have increased pain in her left shoulder. States her BP was high when going to her physician appointments secondary to increased pain   Pertinent History CVA 1999, CVA 12/2014, DM, HTN, last hemoglobin 8.1   How long can you stand comfortably? 5 min   How long can you walk comfortably? 5 min   Diagnostic tests MRI in hospital   Patient Stated Goals decrease pain   Currently in Pain? Yes   Pain Score 8    Pain Location Shoulder   Pain Orientation Left   Pain Descriptors / Indicators Aching   Pain Type Chronic pain        TREATMENT:  Manual Therapy: Pt with Hx of rheumatoid arthritis so joint mobilizations limited to grade I-II;  Bilateral shoulder AP joint mobilizations with distraction, grade I-II 30 seconds/bout x 3 bouts;  Bilateral superior to inferior joint mobs with distraction, grade I-II, 30  seconds/bout x 3 bouts in    Therapeutic Exercise:  Scapular retraction in prone - x20 with manual cueing and assist to achieve full AROM.  Standing with UE ranger on floor flexion/ext, medial/lateral rotation - x20 bilaterally Standing Scapular retraction at Matrix with assist to achieve full AROM - #10 2 x 20 Seated high row at Matrix 12.5# -- 2x15 performed throughout shorter range secondary to    * Patient reports no pain after exercise and manual therapy performance         PT Education - 02/12/16 0824    Education provided Yes   Education Details Educated on performing shoulder mobility exercises for pain relief   Person(s) Educated Patient   Methods Explanation;Demonstration   Comprehension Verbalized understanding;Returned demonstration             PT Long Term Goals - 02/04/16 0925      PT LONG TERM GOAL #1   Title pt will be independent with HEP to promote recovery and decrease reinjury.   Baseline Requires cueing on shoulder positioning   Time 6   Period Weeks   Status On-going     PT LONG TERM GOAL #2   Title Pt will not wake up at night due to pain   Baseline waking up 1x/night due to pain (12/24/15) (01/26/16) Sleep heavily impaired secondary to pain  Time 4   Period Weeks   Status On-going     PT LONG TERM GOAL #3   Title pt will have improved bil shoulder flexion and abduction AROM to 150 deg to improve overall function with household chores.   Baseline R flex: 79 deg, abd: 66 deg;  L flex: 67 deg, abd: 75 deg----on 12/24/15 R flex: 137 deg, abd: 92 deg;  L flex: 101 deg, abd: 61 deg ; on 12/24/15 R flex: 137 deg, abd: 92 deg;  01/26/16: L flex: 90 deg, abd: 70 deg  R Flex: 130, R Abduction: 100   Time 4   Period Weeks   Status Revised               Plan - 02/12/16 0829    Clinical Impression Statement Pt continues to demonstrate decreased pain after AROM exercise but shows decreased functional carryover between visits. Patient continues to  demonstrate decreased motor control and will benefit from further skilled therapy to return to prior level of function   Rehab Potential Fair   Clinical Impairments Affecting Rehab Potential potential difficulty with motivation; prior strokes, pain   PT Frequency 2x / week   PT Duration 6 weeks   PT Treatment/Interventions ADLs/Self Care Home Management;Electrical Stimulation;Moist Heat;Functional mobility training;Therapeutic activities;Therapeutic exercise;Neuromuscular re-education;Patient/family education;Manual techniques;Passive range of motion;Dry needling   PT Next Visit Plan Ball on wall stabilizing with circles; Functional resistance exercises, shoulder mobilizations (grade I-II), scapular stabilizatoin exercises, thoracic extension and postural correction   PT Home Exercise Plan Lumbar roll when sitting, biceps stretch in doorway, towel slides on wall, flexion and abduction 2 x 10, scap retraction and low rows with RTB 2 x 10 1x/day, self mobilization inferior glide to L shoulder   Consulted and Agree with Plan of Care Patient      Patient will benefit from skilled therapeutic intervention in order to improve the following deficits and impairments:  Decreased activity tolerance, Decreased mobility, Decreased range of motion, Decreased strength, Hypomobility, Increased muscle spasms, Impaired flexibility, Postural dysfunction, Improper body mechanics, Pain  Visit Diagnosis: Chronic right shoulder pain  Chronic left shoulder pain  Abnormal posture     Problem List Patient Active Problem List   Diagnosis Date Noted  . Acute CVA (cerebrovascular accident) ([place]) 12/17/2014  . CVA (cerebral infarction) 12/17/2014    Blythe Stanford, PT DPT 02/12/2016, 8:48 AM  [place] MAIN [facility] SERVICES [address] [place], [place], [address] Phone: [phone]   Fax:  [phone]  Name: GWENETTE WELLONS MRN: [id number] Date of Birth: 1951-01-10

## 2016-02-16 ENCOUNTER — Ambulatory Visit: Payer: Medicare Other

## 2016-02-16 VITALS — BP 154/54 | HR 64

## 2016-02-16 DIAGNOSIS — M25511 Pain in right shoulder: Principal | ICD-10-CM

## 2016-02-16 DIAGNOSIS — M25512 Pain in left shoulder: Secondary | ICD-10-CM

## 2016-02-16 DIAGNOSIS — R531 Weakness: Secondary | ICD-10-CM

## 2016-02-16 DIAGNOSIS — G8929 Other chronic pain: Secondary | ICD-10-CM

## 2016-02-16 NOTE — Therapy (Signed)
[place] MAIN [facility] SERVICES [address] [place], [place], [address] Phone: [phone]   Fax:  [phone]  Physical Therapy Treatment  Patient Details  Name: Kelli Gutierrez MRN: [id number] Date of Birth: 02-06-1951 Referring Provider: Melrose Nakayama  Encounter Date: 02/16/2016      PT End of Session - 02/16/16 0804    Visit Number 21   Number of Visits 21   Date for PT Re-Evaluation 02/23/16   Authorization Type 2/10   PT Start Time 0803   PT Stop Time 0845   PT Time Calculation (min) 42 min   Activity Tolerance Patient tolerated treatment well;Patient limited by pain   Behavior During Therapy [facility] for tasks assessed/performed      Past Medical History:  Diagnosis Date  . Chronic kidney disease 12/2015  . Diabetes mellitus without complication ([place])   . Hypertension   . Rheumatoid arthritis ([place])   . Stroke [facility])     Past Surgical History:  Procedure Laterality Date  . MIDDLE EAR SURGERY      Vitals:   02/16/16 0806  BP: (!) 154/54  Pulse: 64  SpO2: 100%        Subjective Assessment - 02/16/16 0804    Subjective "It hurts a lot." Pt complaining of increased bilateral shoulder pain at this time. States that she has been incorporating table slides into HEP. No specific questions at this time.    Pertinent History CVA 1999, CVA 12/2014, DM, HTN, last hemoglobin 8.1   How long can you stand comfortably? 5 min   How long can you walk comfortably? 5 min   Diagnostic tests MRI in hospital   Patient Stated Goals decrease pain   Currently in Pain? Yes   Pain Score 8    Pain Location Shoulder   Pain Orientation Left   Pain Descriptors / Indicators Aching   Pain Type Chronic pain   Pain Onset More than a month ago   Multiple Pain Sites Yes   Pain Score 5   Pain Location Shoulder   Pain Orientation Right   Pain Descriptors / Indicators Aching   Pain Type Chronic pain       TREATMENT:  Therapeutic Exercise:  Standing with  UE ranger on mat table flexion/ext, horizontal adduction/abduction - x20 bilaterally Ball rolls on wall CW/CCW x 20 each side at shoulder height; Physioball slaps with patient providing downward resistance to prevent movement while patient provides dynamic challenge on ball; Bent over table low rows 2# x 15 bilateral; Bent over table high rows 2# x 15 bilateral, pt requiring heavy cues for both low and high rows; D2 flexion and D1 extension patterns in supine with manual resistance x 10 bilateral; Supine AROM flexion with pball squeezes x 5, discontinued secondary to pain;  Manual Therapy: Pt with Hx of rheumatoid arthritis so joint mobilizations limited to grade I-II;  Bilateral shoulder AP joint mobilizations with gnelt distraction, grade I-II 30 seconds/bout x 2 bouts;  Gentle PROM for flexion and scapular plane abduction bilateral;    Pt requiring cues and correction for form and technique with exercises throughout. Requires continual cues to relax during PROM. Pt reports improving pain with manual techniques but is still quite limited secondary to pain.                        PT Education - 02/16/16 0804    Education provided Yes   Education Details Importance of HEP, especially  table slides   Person(s) Educated Patient   Methods Explanation   Comprehension Verbalized understanding             PT Long Term Goals - 02/04/16 1093      PT LONG TERM GOAL #1   Title pt will be independent with HEP to promote recovery and decrease reinjury.   Baseline Requires cueing on shoulder positioning   Time 6   Period Weeks   Status On-going     PT LONG TERM GOAL #2   Title Pt will not wake up at night due to pain   Baseline waking up 1x/night due to pain (12/24/15) (01/26/16) Sleep heavily impaired secondary to pain   Time 4   Period Weeks   Status On-going     PT LONG TERM GOAL #3   Title pt will have improved bil shoulder flexion and abduction AROM to 150 deg  to improve overall function with household chores.   Baseline R flex: 79 deg, abd: 66 deg;  L flex: 67 deg, abd: 75 deg----on 12/24/15 R flex: 137 deg, abd: 92 deg;  L flex: 101 deg, abd: 61 deg ; on 12/24/15 R flex: 137 deg, abd: 92 deg;  01/26/16: L flex: 90 deg, abd: 70 deg  R Flex: 130, R Abduction: 100   Time 4   Period Weeks   Status Revised               Plan - 02/16/16 0805    Clinical Impression Statement Pt demonstrates gradually improving mobility and decreased pain during PT session but pain still remain quite limiting. Pt responds well to PROM but requires cues to decrease guarding.  Pt encouraged to continue HEP and follow-up as scheduled.    Rehab Potential Fair   Clinical Impairments Affecting Rehab Potential potential difficulty with motivation; prior strokes, pain   PT Frequency 2x / week   PT Duration 6 weeks   PT Treatment/Interventions ADLs/Self Care Home Management;Electrical Stimulation;Moist Heat;Functional mobility training;Therapeutic activities;Therapeutic exercise;Neuromuscular re-education;Patient/family education;Manual techniques;Passive range of motion;Dry needling   PT Next Visit Plan Ball on wall stabilizing with circles; Functional resistance exercises, shoulder mobilizations (grade I-II), scapular stabilizatoin exercises, thoracic extension and postural correction   PT Home Exercise Plan Lumbar roll when sitting, biceps stretch in doorway, towel slides on wall, flexion and abduction 2 x 10, scap retraction and low rows with RTB 2 x 10 1x/day, self mobilization inferior glide to L shoulder   Consulted and Agree with Plan of Care Patient      Patient will benefit from skilled therapeutic intervention in order to improve the following deficits and impairments:  Decreased activity tolerance, Decreased mobility, Decreased range of motion, Decreased strength, Hypomobility, Increased muscle spasms, Impaired flexibility, Postural dysfunction, Improper body  mechanics, Pain, Decreased knowledge of precautions  Visit Diagnosis: Chronic right shoulder pain  Chronic left shoulder pain  Weakness generalized     Problem List Patient Active Problem List   Diagnosis Date Noted  . Acute CVA (cerebrovascular accident) ([place]) 12/17/2014  . CVA (cerebral infarction) 12/17/2014   Phillips Grout PT, DPT   Huprich,Jason 02/16/2016, 9:47 AM  [place] MAIN [facility] SERVICES [address] [place], [place], [address] Phone: [phone]   Fax:  [phone]  Name: Kelli Gutierrez MRN: [id number] Date of Birth: April 08, 1951

## 2016-02-17 ENCOUNTER — Ambulatory Visit: Payer: Medicare Other

## 2016-02-24 ENCOUNTER — Ambulatory Visit: Payer: Medicare Other

## 2016-02-24 DIAGNOSIS — M25511 Pain in right shoulder: Principal | ICD-10-CM

## 2016-02-24 DIAGNOSIS — M25512 Pain in left shoulder: Secondary | ICD-10-CM

## 2016-02-24 DIAGNOSIS — G8929 Other chronic pain: Secondary | ICD-10-CM

## 2016-02-24 DIAGNOSIS — R531 Weakness: Secondary | ICD-10-CM

## 2016-02-24 NOTE — Therapy (Signed)
[place] [facility] SERVICES [address] [place], [place], [address] Phone: [phone]   Fax:  [phone]  Physical Therapy Treatment/Discharge Summary  Patient Details  Name: Kelli Gutierrez MRN: [id number] Date of Birth: 08/22/1950 Referring Provider: Melrose Nakayama  Encounter Date: 02/24/2016   Patient has attended 22 out of 22 visits from 11/20/15 to 02/24/16 with some success with long term goals but has progressively decreased in shoulder flexion AROM and worsened resting pain.      PT End of Session - 02/24/16 0956    Visit Number 22   Number of Visits 21   Date for PT Re-Evaluation 02/23/16   Authorization Type 3/10   PT Start Time 0803   PT Stop Time 0845   PT Time Calculation (min) 42 min   Activity Tolerance Patient tolerated treatment well;Patient limited by pain   Behavior During Therapy [facility] for tasks assessed/performed      Past Medical History:  Diagnosis Date  . Chronic kidney disease 12/2015  . Diabetes mellitus without complication ([place])   . Hypertension   . Rheumatoid arthritis ([place])   . Stroke [facility])     Past Surgical History:  Procedure Laterality Date  . MIDDLE EAR SURGERY      There were no vitals filed for this visit.      Subjective Assessment - 02/24/16 0810    Subjective Patient reports her shoulder "is still hurting" and reports it hasn't gotten any better. States she'd like to see orthopedic physician for further evaluation.   Pertinent History CVA 1999, CVA 12/2014, DM, HTN, last hemoglobin 8.1   How long can you stand comfortably? 5 min   How long can you walk comfortably? 5 min   Diagnostic tests MRI in hospital   Patient Stated Goals decrease pain   Currently in Pain? Yes   Pain Location Shoulder   Pain Orientation Left   Pain Descriptors / Indicators Aching   Pain Type Chronic pain   Pain Onset More than a month ago   Pain Score 4   Pain Location Shoulder   Pain Orientation Right   Pain  Descriptors / Indicators Aching   Pain Type Chronic pain   Pain Onset More than a month ago   Pain Frequency Intermittent     MEASUREMENT:  Shoulder AROM: FLEXION: R: 110deg; L: 50deg Internal Rotation: R: 60deg, L: 50deg, External Rotation: R: 80deg L: 80deg  TREATMENT: Therapeutic Exercise:  Resisted Shoulder ER/IR with YTB: -- x 20 B with cueing to decrease ROM to decrease pain Standing with UE ranger on floor flexion/ext, medial/lateral rotation - x31min bilaterally Standing Scapular retraction at Matrix with assist to achieve full AROM - #10 2 x 20 Seated high row at Matrix 12.5# -- 2x15 performed throughout shorter range secondary to    * Educated patient on pain science throughout treatment session and consult with physician about further therapy options. Patient verbally confirms and agrees.           PT Long Term Goals - 02/24/16 0817      PT LONG TERM GOAL #1   Title pt will be independent with HEP to promote recovery and decrease reinjury.   Baseline Requires cueing on shoulder positioning; 02/24/16: Independent with HEP   Time 6   Period Weeks   Status On-going     PT LONG TERM GOAL #2   Title Pt will not wake up at night due to pain   Baseline waking up 1x/night  due to pain (12/24/15) (01/26/16) Sleep heavily impaired secondary to pain 16-Mar-2016: wakes up around 3x a night from pain   Time 4   Period Weeks   Status On-going     PT LONG TERM GOAL #3   Title pt will have improved bil shoulder flexion and abduction AROM to 150 deg to improve overall function with household chores.   Baseline R flex: 79 deg, abd: 66 deg;  L flex: 67 deg, abd: 75 deg----on 12/24/15 R flex: 137 deg, abd: 92 deg;  L flex: 101 deg, abd: 61 deg ; on 12/24/15 R flex: 137 deg, abd: 92 deg;  01/26/16: L flex: 90 deg, abd: 70 deg  R Flex: 130, R Abduction: 100; 16-Mar-2016 L Flex: 50 deg, abd: 50; R flex: 110, abd: 100   Time 4   Period Weeks   Status Revised               Plan -  2016/03/16 9675    Clinical Impression Statement Pt demonstrates worsening symptoms throughout time at therapy with a significant decrease in shoulder AROM (see goals for exact AROM measurements) and increase in shoulder pain despite continuous efforts to maintaining AROM. Patient may have further pathology that has not been recognized; PT recommends consult with physician for further work up.    Rehab Potential Fair   Clinical Impairments Affecting Rehab Potential potential difficulty with motivation; prior strokes, pain   PT Frequency 2x / week   PT Duration 6 weeks   PT Treatment/Interventions ADLs/Self Care Home Management;Electrical Stimulation;Moist Heat;Functional mobility training;Therapeutic activities;Therapeutic exercise;Neuromuscular re-education;Patient/family education;Manual techniques;Passive range of motion;Dry needling   PT Next Visit Plan Ball on wall stabilizing with circles; Functional resistance exercises, shoulder mobilizations (grade I-II), scapular stabilizatoin exercises, thoracic extension and postural correction   PT Home Exercise Plan Lumbar roll when sitting, biceps stretch in doorway, towel slides on wall, flexion and abduction 2 x 10, scap retraction and low rows with RTB 2 x 10 1x/day, self mobilization inferior glide to L shoulder   Consulted and Agree with Plan of Care Patient      Patient will benefit from skilled therapeutic intervention in order to improve the following deficits and impairments:  Decreased activity tolerance, Decreased mobility, Decreased range of motion, Decreased strength, Hypomobility, Increased muscle spasms, Impaired flexibility, Postural dysfunction, Improper body mechanics, Pain, Decreased knowledge of precautions  Visit Diagnosis: Chronic right shoulder pain  Chronic left shoulder pain  Weakness generalized       G-Codes - 03-16-2016 0950    Functional Assessment Tool Used QuickDASH, clinical judgement, MMT, goniometry   Functional  Limitation Carrying, moving and handling objects   Mobility: Walking and Moving Around Current Status (F1638) At least 20 percent but less than 40 percent impaired, limited or restricted   Mobility: Walking and Moving Around Goal Status [phone]) At least 1 percent but less than 20 percent impaired, limited or restricted   Carrying, Moving and Handling Objects Discharge Status [phone]) At least 20 percent but less than 40 percent impaired, limited or restricted      Problem List Patient Active Problem List   Diagnosis Date Noted  . Acute CVA (cerebrovascular accident) ([place]) 12/17/2014  . CVA (cerebral infarction) 12/17/2014    Blythe Stanford, PT DPT 03-16-16, 9:57 AM  [place] [facility] SERVICES [address] [place], [place], [address] Phone: [phone]   Fax:  [phone]  Name: Kelli Gutierrez MRN: [id number] Date of Birth: [birth year]

## 2016-02-26 ENCOUNTER — Other Ambulatory Visit: Payer: Self-pay | Admitting: Rheumatology

## 2016-02-26 DIAGNOSIS — G8929 Other chronic pain: Secondary | ICD-10-CM

## 2016-02-26 DIAGNOSIS — M25512 Pain in left shoulder: Principal | ICD-10-CM

## 2016-03-06 ENCOUNTER — Ambulatory Visit
Admission: RE | Admit: 2016-03-06 | Discharge: 2016-03-06 | Disposition: A | Discharge status: Home / Self Care | Payer: Medicare Other | Source: Ambulatory Visit | Attending: Rheumatology | Admitting: Rheumatology

## 2016-03-06 DIAGNOSIS — M25412 Effusion, left shoulder: Secondary | ICD-10-CM | POA: Insufficient documentation

## 2016-03-06 DIAGNOSIS — M75112 Incomplete rotator cuff tear or rupture of left shoulder, not specified as traumatic: Secondary | ICD-10-CM | POA: Diagnosis not present

## 2016-03-06 DIAGNOSIS — M659 Synovitis and tenosynovitis, unspecified: Secondary | ICD-10-CM | POA: Insufficient documentation

## 2016-03-06 DIAGNOSIS — G8929 Other chronic pain: Secondary | ICD-10-CM

## 2016-03-06 DIAGNOSIS — M25512 Pain in left shoulder: Secondary | ICD-10-CM | POA: Diagnosis present

## 2016-03-09 ENCOUNTER — Ambulatory Visit: Payer: Medicare Other | Admitting: Pain Medicine

## 2016-03-18 ENCOUNTER — Encounter: Payer: Self-pay | Admitting: Pain Medicine

## 2016-03-18 ENCOUNTER — Ambulatory Visit: Payer: No Typology Code available for payment source | Attending: Pain Medicine | Admitting: Pain Medicine

## 2016-03-18 ENCOUNTER — Telehealth: Payer: Self-pay

## 2016-03-18 VITALS — BP 128/53 | HR 73 | Temp 98.0°F | Resp 16 | Ht 62.0 in | Wt 123.0 lb

## 2016-03-18 DIAGNOSIS — Z5181 Encounter for therapeutic drug level monitoring: Secondary | ICD-10-CM

## 2016-03-18 DIAGNOSIS — I6381 Other cerebral infarction due to occlusion or stenosis of small artery: Secondary | ICD-10-CM | POA: Insufficient documentation

## 2016-03-18 DIAGNOSIS — I129 Hypertensive chronic kidney disease with stage 1 through stage 4 chronic kidney disease, or unspecified chronic kidney disease: Secondary | ICD-10-CM | POA: Diagnosis not present

## 2016-03-18 DIAGNOSIS — E1165 Type 2 diabetes mellitus with hyperglycemia: Secondary | ICD-10-CM | POA: Insufficient documentation

## 2016-03-18 DIAGNOSIS — J449 Chronic obstructive pulmonary disease, unspecified: Secondary | ICD-10-CM | POA: Insufficient documentation

## 2016-03-18 DIAGNOSIS — Z7982 Long term (current) use of aspirin: Secondary | ICD-10-CM | POA: Insufficient documentation

## 2016-03-18 DIAGNOSIS — Z7901 Long term (current) use of anticoagulants: Secondary | ICD-10-CM | POA: Insufficient documentation

## 2016-03-18 DIAGNOSIS — Z79891 Long term (current) use of opiate analgesic: Secondary | ICD-10-CM | POA: Diagnosis not present

## 2016-03-18 DIAGNOSIS — M659 Synovitis and tenosynovitis, unspecified: Secondary | ICD-10-CM | POA: Insufficient documentation

## 2016-03-18 DIAGNOSIS — D509 Iron deficiency anemia, unspecified: Secondary | ICD-10-CM | POA: Diagnosis not present

## 2016-03-18 DIAGNOSIS — E785 Hyperlipidemia, unspecified: Secondary | ICD-10-CM | POA: Diagnosis not present

## 2016-03-18 DIAGNOSIS — M542 Cervicalgia: Secondary | ICD-10-CM | POA: Insufficient documentation

## 2016-03-18 DIAGNOSIS — G8929 Other chronic pain: Secondary | ICD-10-CM

## 2016-03-18 DIAGNOSIS — M069 Rheumatoid arthritis, unspecified: Secondary | ICD-10-CM | POA: Diagnosis not present

## 2016-03-18 DIAGNOSIS — M25512 Pain in left shoulder: Secondary | ICD-10-CM | POA: Diagnosis not present

## 2016-03-18 DIAGNOSIS — Z9049 Acquired absence of other specified parts of digestive tract: Secondary | ICD-10-CM | POA: Diagnosis not present

## 2016-03-18 DIAGNOSIS — F329 Major depressive disorder, single episode, unspecified: Secondary | ICD-10-CM | POA: Diagnosis not present

## 2016-03-18 DIAGNOSIS — N189 Chronic kidney disease, unspecified: Secondary | ICD-10-CM | POA: Insufficient documentation

## 2016-03-18 DIAGNOSIS — Z7984 Long term (current) use of oral hypoglycemic drugs: Secondary | ICD-10-CM | POA: Insufficient documentation

## 2016-03-18 DIAGNOSIS — Z8673 Personal history of transient ischemic attack (TIA), and cerebral infarction without residual deficits: Secondary | ICD-10-CM | POA: Insufficient documentation

## 2016-03-18 DIAGNOSIS — F1190 Opioid use, unspecified, uncomplicated: Secondary | ICD-10-CM

## 2016-03-18 DIAGNOSIS — I10 Essential (primary) hypertension: Secondary | ICD-10-CM | POA: Diagnosis not present

## 2016-03-18 DIAGNOSIS — Z0189 Encounter for other specified special examinations: Secondary | ICD-10-CM | POA: Diagnosis not present

## 2016-03-18 DIAGNOSIS — K219 Gastro-esophageal reflux disease without esophagitis: Secondary | ICD-10-CM | POA: Insufficient documentation

## 2016-03-18 DIAGNOSIS — F419 Anxiety disorder, unspecified: Secondary | ICD-10-CM | POA: Diagnosis not present

## 2016-03-18 DIAGNOSIS — M5416 Radiculopathy, lumbar region: Secondary | ICD-10-CM | POA: Insufficient documentation

## 2016-03-18 DIAGNOSIS — R200 Anesthesia of skin: Secondary | ICD-10-CM | POA: Diagnosis not present

## 2016-03-18 DIAGNOSIS — G894 Chronic pain syndrome: Secondary | ICD-10-CM | POA: Insufficient documentation

## 2016-03-18 DIAGNOSIS — M545 Low back pain, unspecified: Secondary | ICD-10-CM | POA: Insufficient documentation

## 2016-03-18 DIAGNOSIS — E119 Type 2 diabetes mellitus without complications: Secondary | ICD-10-CM | POA: Diagnosis not present

## 2016-03-18 DIAGNOSIS — E1151 Type 2 diabetes mellitus with diabetic peripheral angiopathy without gangrene: Secondary | ICD-10-CM | POA: Diagnosis not present

## 2016-03-18 DIAGNOSIS — M48062 Spinal stenosis, lumbar region with neurogenic claudication: Secondary | ICD-10-CM | POA: Insufficient documentation

## 2016-03-18 DIAGNOSIS — I639 Cerebral infarction, unspecified: Secondary | ICD-10-CM

## 2016-03-18 DIAGNOSIS — S134XXS Sprain of ligaments of cervical spine, sequela: Secondary | ICD-10-CM | POA: Diagnosis not present

## 2016-03-18 DIAGNOSIS — E1122 Type 2 diabetes mellitus with diabetic chronic kidney disease: Secondary | ICD-10-CM | POA: Insufficient documentation

## 2016-03-18 DIAGNOSIS — R202 Paresthesia of skin: Secondary | ICD-10-CM

## 2016-03-18 NOTE — Progress Notes (Signed)
Safety precautions to be maintained throughout the outpatient stay will include: orient to surroundings, keep bed in low position, maintain call bell within reach at all times, provide assistance with transfer out of bed and ambulation.  

## 2016-03-18 NOTE — Progress Notes (Deleted)
Nursing Pain Medication Assessment:  Safety precautions to be maintained throughout the outpatient stay will include: orient to surroundings, keep bed in low position, maintain call bell within reach at all times, provide assistance with transfer out of bed and ambulation.  Medication Inspection Compliance: {Blank single:19197::"Kelli Gutierrez did not comply with our request to bring her pills to be counted. She was reminded that bringing the medication bottles, even when empty, is a requirement.","Pill count conducted under aseptic conditions, in front of the patient. Neither the pills nor the bottle was removed from the patient's sight at any time. Once count was completed pills were immediately returned to the patient in their original bottle."} Pill Count: {Blank single:19197::"No pills available to be counted today.","*** of *** pills remain"} Bottle Appearance: {Blank single:19197::"No label. Patient informed that medications must be transported in properly and accurately labeled containers.","Non-pharmacy container. Patient reminded that prescription medications must be kept in original, labeled, pharmacy bottle.","Old prescription bottle. Patient reminded that medications should always be kept in the newest prescription bottle.","Standard pharmacy container. Clearly labeled."} Medication: {Blank single:19197::"Buprenorphine (Suboxone)","Butorphanol (Stadol)","Hydrocodone/APAP","Hydromorphone (Dilaudid)","Methadone","Morphine ER (MSContin)","Morphine IR","Oxycodone ER (OxyContin)","Oxycodone IR","Oxycodone/APAP","Oxymorphone (Opana)","Tapentadol (Nucynta)","Tramadol (Ultram)","See above"} Filled Date: {Blank single:19197::"01","02","03","04","05","06","07","08","09","10","11","12"} / *** / {Blank single:19197::"2020","2019","2018","2017"}

## 2016-03-18 NOTE — Patient Instructions (Signed)
Pain Management Discharge Instructions  General Discharge Instructions :  If you need to reach your doctor call: Monday-Friday 8:00 am - 4:00 pm at [phone] or toll free [phone].  After clinic hours [phone] to have operator reach doctor.  Bring all of your medication bottles to all your appointments in the pain clinic.  To cancel or reschedule your appointment with Pain Management please remember to call 24 hours in advance to avoid a fee.  Refer to the educational materials which you have been given on: General Risks, I had my Procedure. Discharge Instructions, Post Sedation.  Post Procedure Instructions:  The drugs you were given will stay in your system until tomorrow, so for the next 24 hours you should not drive, make any legal decisions or drink any alcoholic beverages.  You may eat anything you prefer, but it is better to start with liquids then soups and crackers, and gradually work up to solid foods.  Please notify your doctor immediately if you have any unusual bleeding, trouble breathing or pain that is not related to your normal pain.  Depending on the type of procedure that was done, some parts of your body may feel week and/or numb.  This usually clears up by tonight or the next day.  Walk with the use of an assistive device or accompanied by an adult for the 24 hours.  You may use ice on the affected area for the first 24 hours.  Put ice in a Ziploc bag and cover with a towel and place against area 15 minutes on 15 minutes off.  You may switch to heat after 24 hours.  CALL AS SOON AS YOU ARE FINISHED WITH mED [facility] EVALUATION

## 2016-03-18 NOTE — Progress Notes (Signed)
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 blood pressure less than 130/80 09/30/2015  . History of CVA (cerebrovascular accident) 04/02/2015  . History of stroke 01/07/2016  . Iron deficiency anemia 06/18/2015  . Rheumatoid arthritis ([place]) 03/18/2016  . Chronic upper extremity numbness  (Right) 06/30/2015  . 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 Chronic pain syndrome 03/18/2016  . Long term current use of opiate analgesic 03/18/2016  . Long term prescription opiate use 03/18/2016  . Opiate use 03/18/2016  . Encounter for pain management planning 03/18/2016  . Encounter for therapeutic drug level monitoring 03/18/2016  . Current use of long term anticoagulation 03/18/2016  . Hyperlipidemia 03/18/2016  . GERD (gastroesophageal reflux disease) 03/18/2016  . Acute posterior neck pain (Left) 03/18/2016  . Acute  bilateral low back pain 03/18/2016  . 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 Whiplash injury, acute, sequela 03/18/2016   Resolved Ambulatory Problems    Diagnosis Date Noted  . Acute CVA (cerebrovascular accident) ([place]) 12/17/2014  . Breast pain, right 06/01/2015  . Lumbar radiculitis 12/06/2013   Past Medical History:  Diagnosis Date  . Acute CVA (cerebrovascular accident) ([place]) 12/17/2014  . Anemia   . Breast pain, right 06/01/2015  . Chronic kidney disease 12/2015  . CVA (cerebral infarction) 12/17/2014  . Diabetes mellitus without complication ([place])   . Hypertension   . Lumbar radiculitis 12/06/2013  . Rheumatoid arthritis ([place])   . Stroke [facility])    Constitutional Exam  General appearance: Well nourished, well developed, and well hydrated. In no apparent acute distress Vitals:   03/18/16 1120  BP: (!) 128/53  Pulse: 73  Resp: 16  Temp: 98 F (36.7 C)  TempSrc: Oral  SpO2: 99%  Weight: 123 lb (55.8 kg)  Height: 5' 2" (1.575 m)   BMI Assessment: Estimated body mass index is 22.5 kg/m as calculated from the following:   Height as of this encounter: 5' 2" (1.575 m).   Weight as of this encounter: 123 lb (55.8 kg).  BMI interpretation table: BMI level Category Range  association with higher incidence of chronic pain  <18 kg/m2 Underweight   18.5-24.9 kg/m2 Ideal body weight   25-29.9 kg/m2 Overweight Increased incidence by 20%  30-34.9 kg/m2 Obese (Class I) Increased incidence by 68%  35-39.9 kg/m2 Severe obesity (Class II) Increased incidence by 136%  >40 kg/m2 Extreme obesity (Class III) Increased incidence by 254%   BMI Readings from Last 4 Encounters:  03/18/16 22.50 kg/m  12/17/14 27.99 kg/m   Wt Readings from Last 4 Encounters:  03/18/16 123 lb (55.8 kg)  12/17/14 158 lb (71.7 kg)  Psych/Mental status: Alert, oriented x 3 (person, place, & time) Eyes: PERLA Respiratory: No evidence of acute respiratory distress  Cervical Spine Exam   Inspection: No masses, redness, or swelling Alignment: Symmetrical Functional ROM: Unrestricted ROM Stability: No instability detected Muscle strength & Tone: Functionally intact Sensory: Unimpaired Palpation: Non-contributory  Upper Extremity (UE) Exam    Side: Right upper extremity  Side: Left upper extremity  Inspection: No masses, redness, swelling, or asymmetry  Inspection: No masses, redness, swelling, or asymmetry  Functional ROM: Unrestricted ROM          Functional ROM: Unrestricted ROM          Muscle strength & Tone: Functionally intact  Muscle strength & Tone: Functionally intact  Sensory: Unimpaired  Sensory: Unimpaired  Palpation: Non-contributory  Palpation: Non-contributory   Thoracic Spine Exam  Inspection: No masses, redness, or swelling Alignment: Symmetrical Functional ROM: Unrestricted ROM Stability: No instability detected Sensory: Unimpaired Muscle strength & Tone: Functionally intact Palpation: Non-contributory  Lumbar Spine Exam  Inspection: No masses, redness, or swelling Alignment: Symmetrical Functional ROM: Unrestricted ROM Stability: No instability detected Muscle strength & Tone: Functionally intact Sensory: Unimpaired Palpation: Non-contributory Provocative Tests: Lumbar Hyperextension and rotation test: evaluation deferred today       Patrick's Maneuver: evaluation deferred today              Gait & Posture Assessment  Ambulation: Unassisted Gait: Relatively normal for age and body habitus Posture: WNL   Lower Extremity Exam    Side: Right lower extremity  Side: Left lower extremity  Inspection: No masses, redness, swelling, or asymmetry  Inspection: No masses, redness, swelling, or asymmetry  Functional ROM: Unrestricted ROM          Functional ROM: Unrestricted ROM          Muscle strength & Tone: Functionally intact  Muscle strength & Tone: Functionally intact  Sensory: Unimpaired  Sensory: Unimpaired  Palpation: Non-contributory   Palpation: Non-contributory   Assessment  Primary Diagnosis & Pertinent Problem List: The primary encounter diagnosis was Chronic pain syndrome. Diagnoses of Long term current use of opiate analgesic, Long term prescription opiate use, Opiate use, Encounter for pain management planning, Encounter for therapeutic drug level monitoring, Current use of long term anticoagulation, History of stroke, Type 2 diabetes mellitus without complication, without long-term current use of insulin ([place]), COPD, mild ([place]), Essential hypertension with goal blood pressure less than 130/80, Hyperlipidemia, unspecified hyperlipidemia type, Gastroesophageal reflux disease without esophagitis, Neck pain on left side, Acute bilateral low back pain without sciatica, History of left Thalamic infarction [facility]), Chronic upper extremity numbness  (Right), Chronic shoulder pain (Left), and Whiplash injury, acute, sequela were also pertinent to this visit.  Visit Diagnosis: 1. Chronic pain syndrome   2. Long term current use of opiate analgesic   3. Long term prescription opiate use   4. Opiate use   5. Encounter for pain management planning   6.  Encounter for therapeutic drug level monitoring   7. Current use of long term anticoagulation   8. History of stroke   9. Type 2 diabetes mellitus without complication, without long-term current use of insulin ([place])   10. COPD, mild ([place])   11. Essential hypertension with goal blood pressure less than 130/80   12. Hyperlipidemia, unspecified hyperlipidemia type   13. Gastroesophageal reflux disease without esophagitis   14. Neck pain on left side   15. Acute bilateral low back pain without sciatica   16. History of left Thalamic infarction ([place])   17. Chronic upper extremity numbness  (Right)   18. Chronic shoulder pain (Left)   19. Whiplash injury, acute, sequela    Plan of Care  Initial treatment plan:  Please be advised that as per protocol, today's visit has been an  evaluation only. We have not taken over the patient's controlled substance management.  Problem-specific plan: Chronic upper extremity numbness  (Right) This symptom has been present since the patient's stroke suggesting a direct link between the two.  Ordered Lab-work, Procedure(s), Referral(s), & Consult(s): Orders Placed This Encounter  Procedures  . DG Cervical Spine Complete  . DG Lumbar Spine Complete W/Bend  . Compliance Drug Analysis, Ur  . Comprehensive metabolic panel  . C-reactive protein  . Magnesium  . Sedimentation rate  . Vitamin B12  . 25-Hydroxyvitamin D Lcms D2+D3   Pharmacotherapy: Medications ordered:  No orders of the defined types were placed in this encounter.  Medications administered during this visit: Ms. Saephanh had no medications administered during this visit.   Pharmacotherapy under consideration:  Opioid Analgesics: The patient was informed that there is no guarantee that she would be a candidate for opioid analgesics. The decision will be made following CDC guidelines. This decision will be based on the results of diagnostic studies, as well as Ms. Erny's risk profile.  Membrane stabilizer: To be determined at a later time Muscle relaxant: To be determined at a later time NSAID: To be determined at a later time Other analgesic(s): To be determined at a later time   Interventional therapies under consideration: Ms. Landen was informed that there is no guarantee that she would be a candidate for interventional therapies. The decision will be based on the results of diagnostic studies, as well as Ms. Altic's risk profile.  Possible procedure(s): Diagnostic suprascapular nerve block without steroids  Possible bilateral suprascapular nerve radiofrequency ablation  Diagnostic left-sided cervical epidural steroid injection under fluoroscopic guidance and IV sedation  Diagnostic left cervical facet block under fluoroscopic guidance and IV sedation     Provider-requested follow-up: Return for 2nd Visit, after [facility] evaluation.  No future appointments.  Primary Care Physician: [facility] Location: [facility] Facility Note by: Kathlen Brunswick. Dossie Arbour, M.D, DABA, DABAPM, DABPM, DABIPP, FIPP Date: 03/18/16; Time: 3:45 PM  Pain Score Disclaimer: We use the NRS-11 scale. This is a self-reported, subjective measurement of pain severity with only modest accuracy. It is used primarily to identify changes within a particular patient. It must be understood that outpatient pain scales are significantly less accurate that those used for research, where they can be applied under ideal controlled circumstances with minimal exposure to variables. In reality, the score is likely to be a combination of pain intensity and pain affect, where pain affect describes the degree of emotional arousal or changes in action readiness caused by the sensory experience of pain. Factors such as social and work situation, setting, emotional state,  anxiety levels, expectation, and prior pain experience may influence pain perception and show large inter-individual differences that may also be affected by time variables.  Patient instructions provided during this appointment: Patient Instructions  Pain Management Discharge Instructions  General Discharge Instructions :  If you need to reach your doctor call: Monday-Friday 8:00 am - 4:00 pm at [phone] or toll free [phone].  After clinic hours [phone] to have operator reach doctor.  Bring all of your medication bottles to all your appointments in the pain clinic.  To cancel or reschedule your appointment with Pain Management please remember to call 24 hours in advance to avoid a fee.  Refer to the educational materials which you have been given on: General Risks, I had my Procedure. Discharge Instructions, Post Sedation.  Post Procedure Instructions:  The  drugs you were given will stay in your system until tomorrow, so for the next 24 hours you should not drive, make any legal decisions or drink any alcoholic beverages.  You may eat anything you prefer, but it is better to start with liquids then soups and crackers, and gradually work up to solid foods.  Please notify your doctor immediately if you have any unusual bleeding, trouble breathing or pain that is not related to your normal pain.  Depending on the type of procedure that was done, some parts of your body may feel week and/or numb.  This usually clears up by tonight or the next day.  Walk with the use of an assistive device or accompanied by an adult for the 24 hours.  You may use ice on the affected area for the first 24 hours.  Put ice in a Ziploc bag and cover with a towel and place against area 15 minutes on 15 minutes off.  You may switch to heat after 24 hours.  CALL AS SOON AS YOU ARE FINISHED WITH [facility] PSYCH EVALUATION  

## 2016-03-18 NOTE — Telephone Encounter (Signed)
Sent pt information to Dr Marjory Lies for psych evaluation

## 2016-03-18 NOTE — Assessment & Plan Note (Signed)
This symptom has been present since the patient's stroke suggesting a direct link between the two.

## 2016-03-23 ENCOUNTER — Encounter: Payer: Self-pay | Admitting: Pain Medicine

## 2016-03-23 ENCOUNTER — Other Ambulatory Visit
Admission: RE | Admit: 2016-03-23 | Discharge: 2016-03-23 | Disposition: A | Discharge status: Home / Self Care | Payer: Medicare Other | Source: Ambulatory Visit | Attending: Pain Medicine | Admitting: Pain Medicine

## 2016-03-23 DIAGNOSIS — G894 Chronic pain syndrome: Secondary | ICD-10-CM

## 2016-03-23 DIAGNOSIS — N183 Chronic kidney disease, stage 3 (moderate): Secondary | ICD-10-CM

## 2016-03-23 DIAGNOSIS — N184 Chronic kidney disease, stage 4 (severe): Secondary | ICD-10-CM | POA: Insufficient documentation

## 2016-03-23 LAB — COMPREHENSIVE METABOLIC PANEL
ALBUMIN: 3.9 g/dL (ref 3.5–5.0)
ALK PHOS: 64 U/L (ref 38–126)
ALT: 16 U/L (ref 14–54)
ANION GAP: 5 (ref 5–15)
AST: 20 U/L (ref 15–41)
BUN: 27 mg/dL — AB (ref 6–20)
CALCIUM: 9 mg/dL (ref 8.9–10.3)
CO2: 25 mmol/L (ref 22–32)
Chloride: 107 mmol/L (ref 101–111)
Creatinine, Ser: 1.34 mg/dL — ABNORMAL HIGH (ref 0.44–1.00)
GFR calc Af Amer: 47 mL/min — ABNORMAL LOW (ref >60)
GFR calc non Af Amer: 41 mL/min — ABNORMAL LOW (ref >60)
GLUCOSE: 119 mg/dL — AB (ref 65–99)
Potassium: 4.4 mmol/L (ref 3.5–5.1)
SODIUM: 137 mmol/L (ref 135–145)
Total Bilirubin: 0.6 mg/dL (ref 0.3–1.2)
Total Protein: 7.8 g/dL (ref 6.5–8.1)

## 2016-03-23 LAB — VITAMIN B12: Vitamin B-12: 1816 pg/mL — ABNORMAL HIGH (ref 180–914)

## 2016-03-23 LAB — C-REACTIVE PROTEIN: CRP: <0.8 mg/dL (ref <1.0)

## 2016-03-23 LAB — SEDIMENTATION RATE: Sed Rate: 47 mm/hr — ABNORMAL HIGH (ref 0–30)

## 2016-03-23 LAB — MAGNESIUM: MAGNESIUM: 1.7 mg/dL (ref 1.7–2.4)

## 2016-03-23 NOTE — Progress Notes (Signed)
-  A normal sedimentation rate should be below 30 mm/hr. The sed rate is an acute phase reactant that indirectly measures the degree of inflammation present in the body. It can be acute, developing rapidly after trauma, injury or infection, for example, or can occur over an extended time (chronic) with conditions such as autoimmune diseases or cancer. The ESR is not diagnostic; it is a non-specific, screening test that may be elevated in a number of these different conditions. It provides general information about the presence or absence of an inflammatory condition. 

## 2016-03-23 NOTE — Progress Notes (Signed)
-  Normal fasting (NPO x 8 hours) glucose levels are between 65-99 mg/dl, with 2 hour fasting, levels are usually less than 140 mg/dl. Any random blood glucose level greater than 200 mg/dl is considered to be Diabetes. - BUN levels between 7 to 20 mg/dL (2.5 to 7.1 mmol/L) are considered normal. Elevated blood urea nitrogen can also be due to: urinary tract obstruction; congestive heart failure or recent heart attack; gastrointestinal bleeding; dehydration; shock; severe burns; certain medications, such as corticosteroids and some antibiotics; and/or a high protein diet. - Normal Creatinine levels are between 0.5 and 0.9 mg/dl for our lab. Any condition that impairs the function of the kidneys is likely to raise the creatinine level in the blood. The most common causes of longstanding (chronic) kidney disease in adults are high blood pressure and diabetes. Other causes of elevated blood creatinine levels include drugs, ingestion of a large amount of dietary meat, kidney infections, rhabdomyolysis (abnormal muscle breakdown), and urinary tract obstruction. - BUN-to-creatinine ratio >20:1 (BUN dispropertionally higher than the creatinine levels) suggests prerenal azotemia (dehydration or renal hypoperfusion), while <10:1 levels suggest renal damage.   eGFR (Estimated Glomerular Filtration Rate) results are reported as milliliters/minute/1.60m (mL/min/1.757m. Because some laboratories do not collect information on a patient's race when the sample is collected for testing, they may report calculated results for both African Americans and non-African Americans.  The NaNation[facility] Mutual InsuranceNNorth[facility] LLCsuggests only reporting actual results once values are < 60 mL/min. 1. Normal values: 90-120 mL/min 2. Below 60 mL/min suggests that some kidney damage has occurred. 3. Between 5925nd 30 indicate (Moderate) Stage 3 kidney disease. 4. Between 29 and 15 represent (Severe) Stage 4 kidney disease. 5. Less than 15 is  considered (Kidney Failure) Stage 5.

## 2016-03-23 NOTE — Progress Notes (Signed)
Normal Vitamin B-12 level(s): are between 180 and 914 pg/mL.  Elevated Vit B-12 level(s): Levels above 914 pg/mL. Possible causes: Taking supplements of vitamin B-12 (cobalamin). Medical conditions that can increase levels of vitamin B12 include: liver disease, kidney failure and myeloproliferative disorders, which includes myelocytic leukemia and polycythemia vera. Recommendations: Stop vitamin B-12 supplements. Contact primary care physician for further evaluation and recommendations. 

## 2016-03-24 LAB — COMPLIANCE DRUG ANALYSIS, UR

## 2016-03-26 LAB — 25-HYDROXYVITAMIN D LCMS D2+D3
25-HYDROXY, VITAMIN D-3: 19 ng/mL
25-HYDROXY, VITAMIN D: 19 ng/mL — AB

## 2016-03-26 LAB — 25-HYDROXY VITAMIN D LCMS D2+D3: 25-Hydroxy, Vitamin D-2: <1 ng/mL

## 2016-06-01 ENCOUNTER — Ambulatory Visit: Payer: Medicare Other | Admitting: Pain Medicine

## 2016-06-01 NOTE — Progress Notes (Deleted)
Patient's Name: Kelli Gutierrez  MRN: [id number]  Referring Provider: [facility]  DOB: 1950/11/10  PCP: [facility]  DOS: 06/01/2016  Note by: Kathlen Brunswick. Dossie Arbour, MD  Service setting: Ambulatory outpatient  Specialty: Interventional Pain Management  Location: [facility] (AMB) Pain Management Facility    Patient type: Established   Primary Reason(s) for Visit: Encounter for evaluation before starting new chronic pain management plan of care (Level of risk: moderate) CC: No chief complaint on file.  HPI  Kelli Gutierrez is a 66 y.o. year old, female patient, who comes today for a follow-up evaluation to review the test results and decide on a treatment plan. She has Chronic shoulder pain (Location of Primary Source of Pain) (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); COPD, mild ([place]); Diabetes mellitus type 2, uncomplicated ([place]); Essential hypertension with goal blood pressure less than 130/80; History of CVA (cerebrovascular accident); History of stroke; Iron deficiency anemia; Rheumatoid arthritis ([place]); Chronic upper extremity numbness  (Right); Spinal stenosis of lumbar region with neurogenic claudication; Chronic pain syndrome; Long term current use of opiate analgesic; Long term prescription opiate use; Opiate use; Encounter for pain management planning; Encounter for therapeutic drug level monitoring; Current use of long term anticoagulation; Hyperlipidemia; GERD (gastroesophageal reflux disease); Acute posterior neck pain (Left); Acute bilateral low back pain; History of left Thalamic infarction ([place]); Whiplash injury, acute, sequela; and Stage III chronic kidney disease on her problem list. Her primarily concern today is the No chief complaint on file.  Pain Assessment: Self-Reported Pain Score:  /10             Reported level is compatible with observation.          Kelli Gutierrez comes in today for a follow-up visit after her initial evaluation on 03/18/2016. Today we went over the results of her  tests. These were explained in "Layman's terms". During today's appointment we went over my diagnostic impression, as well as the proposed treatment plan.  In considering the treatment plan options, Kelli Gutierrez was reminded that I no longer take patients for medication management only. I asked her to let me know if she had no intention of taking advantage of the interventional therapies, so that we could make arrangements to provide this space to someone interested. I also made it clear that undergoing interventional therapies for the purpose of getting pain medications is very inappropriate on the part of a patient, and it will not be tolerated in this practice. This type of behavior would suggest true addiction and therefore it requires referral to an addiction specialist.   Further details on both, my assessment(s), as well as the proposed treatment plan, please see below. Controlled Substance Pharmacotherapy Assessment REMS (Risk Evaluation and Mitigation Strategy)  Analgesic: Tramadol 50 mg 1 tablet by mouth twice a day MME/day: 10 mg/day Pill Count: None expected due to no prior prescriptions written by our practice. Pharmacokinetics: Liberation and absorption (onset of action): WNL Distribution (time to peak effect): WNL Metabolism and excretion (duration of action): WNL         Pharmacodynamics: Desired effects: Analgesia: Kelli Gutierrez reports >50% benefit. Functional ability: Patient reports that medication allows her to accomplish basic ADLs Clinically meaningful improvement in function (CMIF): Sustained CMIF goals met Perceived effectiveness: Described as relatively effective, allowing for increase in activities of daily living (ADL) Undesirable effects: Side-effects or Adverse reactions: None reported Monitoring: [place] PMP: Online review of the past 34-monthperiod previously conducted. Not applicable at this point since we have not  taken over the patient's medication management yet. List  of all UDS test(s) done:  Lab Results  Component Value Date   SUMMARY FINAL 03/18/2016   Last UDS on record: Summary  Date Value Ref Range Status  03/18/2016 FINAL  Final    Comment:    ==================================================================== TOXASSURE COMP DRUG ANALYSIS,UR ==================================================================== Test                             Result       Flag       Units Drug Present and Declared for Prescription Verification   Metoprolol                     PRESENT      EXPECTED Drug Absent but Declared for Prescription Verification   Tramadol                       Not Detected UNEXPECTED   Salicylate                     Not Detected UNEXPECTED    Aspirin, as indicated in the declared medication list, is not    always detected even when used as directed. ==================================================================== Test                      Result    Flag   Units      Ref Range   Creatinine              83               mg/dL      >=20 ==================================================================== Declared Medications:  The flagging and interpretation on this report are based on the  following declared medications.  Unexpected results may arise from  inaccuracies in the declared medications.  **Note: The testing scope of this panel includes these medications:  Metoprolol (Lopressor)  Tramadol (Ultram)  **Note: The testing scope of this panel does not include small to  moderate amounts of these reported medications:  Aspirin (Aspirin 81)  **Note: The testing scope of this panel does not include following  reported medications:  Atorvastatin (Lipitor)  Clopidogrel (Plavix)  Hydroxychloroquine (Plaquenil)  Lisinopril (Prinivil)  Metformin (Glucophage)  Ranitidine (Zantac)  Sitagliptin (Januvia) ==================================================================== For clinical consultation, please call [phone]. ====================================================================    UDS interpretation: No unexpected findings.          Medication Assessment Form: Patient introduced to form today Treatment compliance: Treatment may start today if patient agrees with proposed plan. Evaluation of compliance is not applicable at this point Risk Assessment Profile: Aberrant behavior: See initial evaluations. None observed or detected today Comorbid factors increasing risk of overdose: See initial evaluation. No additional risks detected today Risk Mitigation Strategies:  Patient opioid safety counseling: Completed today. Counseling provided to patient as per "Patient Counseling Document". Document signed by patient, attesting to counseling and understanding Patient-Prescriber Agreement (PPA): Obtained today  Controlled substance notification to other providers: Written and sent today  Pharmacologic Plan: Today we may be taking over the patient's pharmacological regimen. See below  Laboratory Chemistry  Inflammation Markers Lab Results  Component Value Date   ESRSEDRATE 47 (H) 03/23/2016   CRP <0.8 03/23/2016   Renal Function Lab Results  Component Value Date   BUN 27 (H) 03/23/2016   CREATININE 1.34 (H) 03/23/2016   GFRAA 47 (L) 03/23/2016  GFRNONAA 41 (L) 03/23/2016   Hepatic Function Lab Results  Component Value Date   AST 20 03/23/2016   ALT 16 03/23/2016   ALBUMIN 3.9 03/23/2016   Electrolytes Lab Results  Component Value Date   NA 137 03/23/2016   K 4.4 03/23/2016   CL 107 03/23/2016   CALCIUM 9.0 03/23/2016   MG 1.7 03/23/2016   Pain Modulating Vitamins Lab Results  Component Value Date   25OHVITD1 19 (L) 03/23/2016   25OHVITD2 <1.0 03/23/2016   25OHVITD3 19 03/23/2016   VITAMINB12 1,816 (H) 03/23/2016   Coagulation Parameters Lab Results  Component Value Date   INR 0.93 12/18/2014   LABPROT 12.7 12/18/2014   APTT 30 12/17/2014   PLT 251 12/18/2014    Cardiovascular Lab Results  Component Value Date   BNP 75 10/10/2012   HGB 13.1 12/18/2014   HCT 39.3 12/18/2014   Note: Lab results reviewed.  Recent Diagnostic Imaging Review  Shoulder Imaging: Shoulder-L MR wo contrast:  Results for orders placed during the hospital encounter of 03/06/16  MR SHOULDER LEFT WO CONTRAST   Narrative CLINICAL DATA:  Left shoulder pain for 1 year radiating down the left arm to the elbow. Limited and painful range of motion.  EXAM: MRI OF THE LEFT SHOULDER WITHOUT CONTRAST  TECHNIQUE: Multiplanar, multisequence MR imaging of the shoulder was performed. No intravenous contrast was administered.  COMPARISON:  09/23/2008 CT arthrogram of the left shoulder.  FINDINGS: Rotator cuff: Full-thickness partial width tear of the anterior supraspinatus tendon, otherwise with moderate supraspinatus and mild infraspinatus and subscapularis tendinopathy.  Muscles:  Unremarkable  Biceps long head: Moderate tendinopathy of the intra-articular segment.  Acromioclavicular Joint: Mild spurring Type II acromion. As expected there is fluid in the subacromial subdeltoid bursa. There is also considerable subacromial spurring.  Glenohumeral Joint: Small joint effusion. Indistinct coracohumeral ligament, possible synovitis in the rotator interval. Minimal spurring of the humeral head.  Labrum:  Grossly unremarkable  Bones: No significant extra-articular osseous abnormalities identified.  Other: No supplemental non-categorized findings.  IMPRESSION: 1. Full-thickness partial width tear of the anterior supraspinatus tendon, with moderate supraspinatus and mild infraspinatus and subscapularis tendinopathy. 2. Moderate biceps tendinopathy. 3. Small glenohumeral joint effusion communicates with the subacromial subdeltoid bursa. 4. Mild synovitis in the rotator interval.   Electronically Signed   By: Van Clines M.D.   On: 03/06/2016 17:49     Shoulder-L CT wo contrast:  Results for orders placed in visit on 09/23/08  CT Shoulder Left Wo Contrast   Narrative * PRIOR REPORT IMPORTED FROM AN EXTERNAL SYSTEM *   PRIOR REPORT IMPORTED FROM THE [employer] WORKFLOW SYSTEM   REASON FOR EXAM:    STAT READ Left Shoulder Pain RCT  Diabetic Metformin  On  Aspirin Products  COMMENTS:   PROCEDURE:     CT  - CT SHOULDER LEFT WO  - Sep 23 2008 11:05AM   RESULT:     History:  Shoulder pain   Comparison: No comparison   Technique:  Multiple axial images are obtained of the left shoulder  following intra-articular injection of dilute Optiray 320 with sagittal  and  coronal reformatted images provided..   Findings:   There is no fracture or dislocation.   The subscapularis tendon is intact. There is a full-thickness tear of the  a  posterior fibers of the supraspinatus tendon. The infraspinatus tendon  appears intact. The teres minor tendon is intact.   There is no atrophy or fatty replacement of the muscles  of the rotator  cuff.   The labrum is grossly intact.   The intraarticular and extraarticular portions of the biceps tendon are  grossly intact.   The acromioclavicular joint is unremarkable.   The visualized portions of the left lung are clear.   IMPRESSION:   1. There is a full-thickness tear of the a posterior fibers of the  supraspinatus tendon.       Lumbosacral Imaging: Lumbar CT wo contrast:  Results for orders placed in visit on 10/16/13  CT Lumbar Spine Wo Contrast   Narrative * PRIOR REPORT IMPORTED FROM AN EXTERNAL SYSTEM *   CLINICAL DATA:  Low back and buttock pain extending to both legs.  Unable to have MR scan secondary to prior ear surgery.   EXAM:  CT LUMBAR SPINE WITHOUT CONTRAST   TECHNIQUE:  Multidetector CT imaging of the lumbar spine was performed without  intravenous contrast administration. Multiplanar CT image  reconstructions were also generated.   COMPARISON:  12/01/2007 CT.    FINDINGS:  Last fully open disk space is labeled L5-S1. Present examination  incorporates from T12-L1 disc space through the lower sacrum.   Atherosclerotic type changes of the aorta and iliac arteries without  aneurysmal dilation.   T12-L1:  Mild facet joint degenerative changes.   L1-2: Facet joint degenerative changes. Calcification right  ligamentum flavum. Minimal impression right posterior lateral aspect  of the thecal sac.   L2-3: Facet joint degenerative changes. Ligamentum flavum  hypertrophy with calcification greater on the left. Minimal  indentation upon the adjacent thecal sac. Mild bulge. Multifactorial  mild to slightly moderate spinal stenosis and lateral recess  stenosis.   L3-4: Bulge with peripheral calcification. Mild facet joint  degenerative changes. Ligamentum flavum hypertrophy slightly greater  on the left. Multifactorial mild spinal stenosis and lateral recess  stenosis slightly greater on left.   L4-5: Prominent facet joint degenerative changes. 1.2 mm anterior  slip L4. Ligamentum flavum hypertrophy with calcification greater on  the left. Bulge. Multifactorial moderate spinal stenosis and lateral  recess stenosis greater on the left. Mild bilateral foraminal  narrowing.   L5-S1: Facet joint degenerative changes. Bulge with peripheral  calcification and slight caudal extension centrally. Mild to  slightly moderate lateral recess stenosis. Mild bilateral foraminal  narrowing. No significant thecal sac compromise.   Mild bilateral sacroiliac joint degenerative changes   IMPRESSION:  Degenerative changes most prominent L4-5 level.   Summary of pertinent findings include:   L2-3 multifactorial mild to slightly moderate spinal stenosis and  lateral recess stenosis.   L3-4 multifactorial mild spinal stenosis and lateral recess stenosis  slightly greater on left.   L4-5 multifactorial moderate spinal stenosis and lateral recess  stenosis  greater on the left. Mild bilateral foraminal narrowing.   L5-S1 multifactorial mild to slightly moderate lateral recess  stenosis. Mild bilateral foraminal narrowing.   Mild bilateral sacroiliac joint degenerative changes.   Please see above for further detail.    Electronically Signed    By: Chauncey Cruel M.D.    On: 10/16/2013 12:57       Note: Results of ordered imaging test(s) reviewed and explained to patient in Layman's terms. Copy of results provided to patient  Meds  The patient has a current medication list which includes the following prescription(s): aspirin, atorvastatin, clopidogrel, hydroxychloroquine, lisinopril, metformin, metoprolol, ranitidine, sitagliptin, and tramadol.  Current Outpatient Prescriptions on File Prior to Visit  Medication Sig  . aspirin 81 MG chewable tablet Chew 81 mg by mouth  daily.  . atorvastatin (LIPITOR) 40 MG tablet Take 40 mg by mouth daily.  . clopidogrel (PLAVIX) 75 MG tablet Take 75 mg by mouth daily.  . hydroxychloroquine (PLAQUENIL) 200 MG tablet Take 200 mg by mouth daily.  Marland Kitchen lisinopril (PRINIVIL,ZESTRIL) 20 MG tablet Take 20 mg by mouth daily.  . metFORMIN (GLUCOPHAGE) 1000 MG tablet Take 1,000 mg by mouth 2 (two) times daily with a meal.  . metoprolol (LOPRESSOR) 50 MG tablet Take 50 mg by mouth 2 (two) times daily.  . ranitidine (ZANTAC) 150 MG capsule Take 150 mg by mouth 2 (two) times daily.  . sitaGLIPtin (JANUVIA) 100 MG tablet Take 100 mg by mouth daily.  . traMADol (ULTRAM) 50 MG tablet Take 50 mg by mouth 2 (two) times daily.   No current facility-administered medications on file prior to visit.    ROS  Constitutional: Denies any fever or chills Gastrointestinal: No reported hemesis, hematochezia, vomiting, or acute GI distress Musculoskeletal: Denies any acute onset joint swelling, redness, loss of ROM, or weakness Neurological: No reported episodes of acute onset apraxia, aphasia, dysarthria, agnosia, amnesia,  paralysis, loss of coordination, or loss of consciousness  Allergies  Ms. Halliwell has No Known Allergies.  PFSH  Drug: Ms. Gunderson  has no drug history on file. Alcohol:  reports that she does not drink alcohol. Tobacco:  reports that she has never smoked. She has never used smokeless tobacco. Medical:  has a past medical history of Acute CVA (cerebrovascular accident) ([place]) (12/17/2014); Anemia; Breast pain, right (06/01/2015); Chronic kidney disease (12/2015); CVA (cerebral infarction) (12/17/2014); Diabetes mellitus without complication ([place]); Hypertension; Lumbar radiculitis (12/06/2013); Rheumatoid arthritis ([place]); and Stroke ([place]). Family: family history is not on file.  Past Surgical History:  Procedure Laterality Date  . APPENDECTOMY    . CESAREAN SECTION     4  . CHOLECYSTECTOMY    . MIDDLE EAR SURGERY     Constitutional Exam  General appearance: Well nourished, well developed, and well hydrated. In no apparent acute distress There were no vitals filed for this visit. BMI Assessment: Estimated body mass index is 22.5 kg/m as calculated from the following:   Height as of 03/18/16: 5' 2"  (1.575 m).   Weight as of 03/18/16: 123 lb (55.8 kg).  BMI interpretation table: BMI level Category Range association with higher incidence of chronic pain  <18 kg/m2 Underweight   18.5-24.9 kg/m2 Ideal body weight   25-29.9 kg/m2 Overweight Increased incidence by 20%  30-34.9 kg/m2 Obese (Class I) Increased incidence by 68%  35-39.9 kg/m2 Severe obesity (Class II) Increased incidence by 136%  >40 kg/m2 Extreme obesity (Class III) Increased incidence by 254%   BMI Readings from Last 4 Encounters:  03/18/16 22.50 kg/m  12/17/14 27.99 kg/m   Wt Readings from Last 4 Encounters:  03/18/16 123 lb (55.8 kg)  12/17/14 158 lb (71.7 kg)  Psych/Mental status: Alert, oriented x 3 (person, place, & time)       Eyes: PERLA Respiratory: No evidence of acute respiratory distress  Cervical Spine Exam   Inspection: No masses, redness, or swelling Alignment: Symmetrical Functional ROM: Unrestricted ROM Stability: No instability detected Muscle strength & Tone: Functionally intact Sensory: Unimpaired Palpation: Non-contributory  Upper Extremity (UE) Exam    Side: Right upper extremity  Side: Left upper extremity  Inspection: No masses, redness, swelling, or asymmetry. No contractures  Inspection: No masses, redness, swelling, or asymmetry. No contractures  Functional ROM: Unrestricted ROM  Functional ROM: Unrestricted ROM          Muscle strength & Tone: Functionally intact  Muscle strength & Tone: Functionally intact  Sensory: Unimpaired  Sensory: Unimpaired  Palpation: Euthermic  Palpation: Euthermic  Specialized Test(s): Deferred         Specialized Test(s): Deferred          Thoracic Spine Exam  Inspection: No masses, redness, or swelling Alignment: Symmetrical Functional ROM: Unrestricted ROM Stability: No instability detected Sensory: Unimpaired Muscle strength & Tone: Functionally intact Palpation: Non-contributory  Lumbar Spine Exam  Inspection: No masses, redness, or swelling Alignment: Symmetrical Functional ROM: Unrestricted ROM Stability: No instability detected Muscle strength & Tone: Functionally intact Sensory: Unimpaired Palpation: Non-contributory Provocative Tests: Lumbar Hyperextension and rotation test: evaluation deferred today       Patrick's Maneuver: evaluation deferred today              Gait & Posture Assessment  Ambulation: Unassisted Gait: Relatively normal for age and body habitus Posture: WNL   Lower Extremity Exam    Side: Right lower extremity  Side: Left lower extremity  Inspection: No masses, redness, swelling, or asymmetry. No contractures  Inspection: No masses, redness, swelling, or asymmetry. No contractures  Functional ROM: Unrestricted ROM          Functional ROM: Unrestricted ROM          Muscle strength & Tone:  Functionally intact  Muscle strength & Tone: Functionally intact  Sensory: Unimpaired  Sensory: Unimpaired  Palpation: No palpable anomalies  Palpation: No palpable anomalies   Assessment & Plan  Primary Diagnosis & Pertinent Problem List: There were no encounter diagnoses.  Visit Diagnosis: No diagnosis found. Problems updated and reviewed during this visit: No problems updated. Problem-specific Plan(s): No problem-specific Assessment & Plan notes found for this encounter.  Assessment & plan notes cannot be loaded without a specified hospital service.  Plan of Care  Pharmacotherapy (Medications Ordered): No orders of the defined types were placed in this encounter.  Lab-work, procedure(s), and/or referral(s): No orders of the defined types were placed in this encounter.   Pharmacotherapy: Opioid Analgesics: We'll take over management today. See above orders Membrane stabilizer: We have discussed the possibility of optimizing this mode of therapy, if tolerated Muscle relaxant: We have discussed the possibility of a trial NSAID: We have discussed the possibility of a trial Other analgesic(s): To be determined at a later time   Interventional therapies: Planned, scheduled, and/or pending:    ***   Considering:   Diagnostic suprascapular nerve block without steroids  Possible bilateral suprascapular nerve radiofrequency ablation  Diagnostic left-sided cervical epidural steroid injection under fluoroscopic guidance and IV sedation  Diagnostic left cervical facet block under fluoroscopic guidance and IV sedation    PRN Procedures:   To be determined at a later time   Provider-requested follow-up: No Follow-up on file.  Future Appointments Date Time Provider [place]  06/01/2016 9:45 AM Milinda Pointer, MD [facility] None    Primary Care Physician: [facility] Location: [facility] Out[facility] Management Facility Note by: Kathlen Brunswick.  Dossie Arbour, M.D, DABA, DABAPM, DABPM, DABIPP, FIPP Date: 06/01/2016; Time: 8:09 AM  Pain Score Disclaimer: We use the NRS-11 scale. This is a self-reported, subjective measurement of pain severity with only modest accuracy. It is used primarily to identify changes within a particular patient. It must be understood that outpatient pain scales are significantly less accurate that those used for research, where they can be applied under  ideal controlled circumstances with minimal exposure to variables. In reality, the score is likely to be a combination of pain intensity and pain affect, where pain affect describes the degree of emotional arousal or changes in action readiness caused by the sensory experience of pain. Factors such as social and work situation, setting, emotional state, anxiety levels, expectation, and prior pain experience may influence pain perception and show large inter-individual differences that may also be affected by time variables.  Patient instructions provided during this appointment: There are no Patient Instructions on file for this visit.

## 2016-06-23 ENCOUNTER — Other Ambulatory Visit: Payer: Self-pay | Admitting: Pain Medicine

## 2016-06-23 DIAGNOSIS — E559 Vitamin D deficiency, unspecified: Secondary | ICD-10-CM

## 2016-06-23 MED ORDER — VITAMIN D3 50 MCG (2000 UT) PO CAPS: ORAL_CAPSULE | ORAL | 99 refills | Status: DC

## 2016-06-23 MED ORDER — VITAMIN D (ERGOCALCIFEROL) 1.25 MG (50000 UNIT) PO CAPS: ORAL_CAPSULE | ORAL | 0 refills | Status: DC

## 2016-06-23 NOTE — Progress Notes (Signed)
Reason for ordering the test: To determine the cause of the diffuse arthralgias & myalgias. Finding(s): Low Vitamin D levels Explanation of findings:  Low Vitamin D Results: Normal levels: between 30 and 100 ng/mL. Vitamin D Insufficiency: Levels between 20-30 ng/ml are defined as a "Vitamin D insufficiency". Vitamin D Deficiency: Levels below 20 ng/ml, is diagnosed as a "Vitamin D Deficiency". Common causes include: dietary insufficiency; inadequate sun exposure; inability to absorb vitamin D from the intestines; or inability to process it due to kidney or liver disease. Low 25-hydroxyvitamin D: A low blood level of 25-hydroxyvitamin D may mean that a person is not getting enough exposure to sunlight or enough dietary vitamin D to meet his or her body's demand or that there is a problem with its absorption from the intestines. Occasionally, drugs used to treat seizures, particularly phenytoin (Dilantin), can interfere with the production of 25-hydroxyvitamin D in the liver. There is some evidence that vitamin D deficiency may increase the risk of some cancers, immune diseases, and cardiovascular disease. Low 1,25-dihydroxyvitamin D: A low level of 1,25-dihydroxyvitamin D can be seen in kidney disease and is one of the earliest changes to occur in persons with early kidney failure. Associated complications may include: hypocalcemia, hypophosphatemia, and reduced bone density. Associated symptoms: Vitamin D deficiencies and insufficiencies may be associated with fatigue, weakness, bone pain, joint pain, and muscle pain. Recommendation(s): Patient may benefit from taking over-the-counter Vitamin D3 supplements. I recommend a vitamin D + Calcium supplements. "[employer]atures [employer]", a brand easily found in most pharmacies, has a formulation containing Calcium 1200 mg plus Vitamin D3 1000 IU, in Softgels capsules that are easy to swallow. This should be taken once a day, preferably in the morning as vitamin D will  increase energy levels and make it difficult to fall asleep, if taken at night. Patients with levels lower than 20 ng/ml should contact their primary care physicians to receive replacement therapy. Vitamin D3 can be obtained over-the-counter, without a prescription. Vitamin D2 requires a prescription and it is used for replacement therapy.

## 2016-06-29 ENCOUNTER — Encounter: Payer: Self-pay | Admitting: Pain Medicine

## 2016-06-29 ENCOUNTER — Ambulatory Visit: Payer: Medicare Other | Attending: Pain Medicine | Admitting: Pain Medicine

## 2016-06-29 VITALS — BP 132/64 | HR 73 | Temp 97.5°F | Resp 16 | Ht 62.0 in | Wt 127.0 lb

## 2016-06-29 DIAGNOSIS — Z7901 Long term (current) use of anticoagulants: Secondary | ICD-10-CM

## 2016-06-29 DIAGNOSIS — D509 Iron deficiency anemia, unspecified: Secondary | ICD-10-CM | POA: Diagnosis not present

## 2016-06-29 DIAGNOSIS — E559 Vitamin D deficiency, unspecified: Secondary | ICD-10-CM

## 2016-06-29 DIAGNOSIS — Z9049 Acquired absence of other specified parts of digestive tract: Secondary | ICD-10-CM | POA: Diagnosis not present

## 2016-06-29 DIAGNOSIS — Z79891 Long term (current) use of opiate analgesic: Secondary | ICD-10-CM | POA: Insufficient documentation

## 2016-06-29 DIAGNOSIS — Z7982 Long term (current) use of aspirin: Secondary | ICD-10-CM | POA: Insufficient documentation

## 2016-06-29 DIAGNOSIS — M25511 Pain in right shoulder: Secondary | ICD-10-CM

## 2016-06-29 DIAGNOSIS — Z92241 Personal history of systemic steroid therapy: Secondary | ICD-10-CM | POA: Diagnosis not present

## 2016-06-29 DIAGNOSIS — K219 Gastro-esophageal reflux disease without esophagitis: Secondary | ICD-10-CM | POA: Insufficient documentation

## 2016-06-29 DIAGNOSIS — E785 Hyperlipidemia, unspecified: Secondary | ICD-10-CM | POA: Diagnosis not present

## 2016-06-29 DIAGNOSIS — G894 Chronic pain syndrome: Secondary | ICD-10-CM | POA: Diagnosis not present

## 2016-06-29 DIAGNOSIS — M25512 Pain in left shoulder: Secondary | ICD-10-CM

## 2016-06-29 DIAGNOSIS — Z8673 Personal history of transient ischemic attack (TIA), and cerebral infarction without residual deficits: Secondary | ICD-10-CM | POA: Diagnosis not present

## 2016-06-29 DIAGNOSIS — I129 Hypertensive chronic kidney disease with stage 1 through stage 4 chronic kidney disease, or unspecified chronic kidney disease: Secondary | ICD-10-CM | POA: Diagnosis not present

## 2016-06-29 DIAGNOSIS — G8929 Other chronic pain: Secondary | ICD-10-CM | POA: Insufficient documentation

## 2016-06-29 DIAGNOSIS — M069 Rheumatoid arthritis, unspecified: Secondary | ICD-10-CM | POA: Diagnosis not present

## 2016-06-29 DIAGNOSIS — J449 Chronic obstructive pulmonary disease, unspecified: Secondary | ICD-10-CM | POA: Insufficient documentation

## 2016-06-29 DIAGNOSIS — Z7984 Long term (current) use of oral hypoglycemic drugs: Secondary | ICD-10-CM | POA: Insufficient documentation

## 2016-06-29 DIAGNOSIS — N183 Chronic kidney disease, stage 3 (moderate): Secondary | ICD-10-CM | POA: Diagnosis not present

## 2016-06-29 DIAGNOSIS — E1122 Type 2 diabetes mellitus with diabetic chronic kidney disease: Secondary | ICD-10-CM | POA: Insufficient documentation

## 2016-06-29 MED ORDER — VITAMIN D (ERGOCALCIFEROL) 1.25 MG (50000 UNIT) PO CAPS: ORAL_CAPSULE | ORAL | 0 refills | Status: DC

## 2016-06-29 MED ORDER — VITAMIN D3 50 MCG (2000 UT) PO CAPS: ORAL_CAPSULE | ORAL | 99 refills | Status: DC

## 2016-06-29 MED ORDER — TRAMADOL HCL 50 MG PO TABS: 50.0000 mg | ORAL_TABLET | Freq: Two times a day (BID) | ORAL | 0 refills | Status: DC

## 2016-06-29 NOTE — Patient Instructions (Addendum)
Pain Score  Introduction: The pain score used by this practice is the Verbal Numerical Rating Scale (VNRS-11). This is an 11-point scale. It is for adults and children 10 years or older. There are significant differences in how the pain score is reported, used, and applied. Forget everything you learned in the past and learn this scoring system.  General Information: The scale should reflect your current level of pain. Unless you are specifically asked for the level of your worst pain, or your average pain. If you are asked for one of these two, then it should be understood that it is over the past 24 hours.  Basic Activities of Daily Living (ADL): Personal hygiene, dressing, eating, transferring, and using restroom.  Instructions: Most patients tend to report their level of pain as a combination of two factors, their physical pain and their psychosocial pain. This last one is also known as "suffering" and it is reflection of how physical pain affects you socially and psychologically. From now on, report them separately. From this point on, when asked to report your pain level, report only your physical pain. Use the following table for reference.  Pain Clinic Pain Levels (0-5/10)  Pain Level Score Description  No Pain 0   Mild pain 1 Nagging, annoying, but does not interfere with basic activities of daily living (ADL). Patients are able to eat, bathe, get dressed, toileting (being able to get on and off the toilet and perform personal hygiene functions), transfer (move in and out of bed or a chair without assistance), and maintain continence (able to control bladder and bowel functions). Blood pressure and heart rate are unaffected. A normal heart rate for a healthy adult ranges from 60 to 100 bpm (beats per minute).   Mild to moderate pain 2 Noticeable and distracting. Impossible to hide from other people. More frequent flare-ups. Still possible to adapt and function close to normal. It can be very  annoying and may have occasional stronger flare-ups. With discipline, patients may get used to it and adapt.   Moderate pain 3 Interferes significantly with activities of daily living (ADL). It becomes difficult to feed, bathe, get dressed, get on and off the toilet or to perform personal hygiene functions. Difficult to get in and out of bed or a chair without assistance. Very distracting. With effort, it can be ignored when deeply involved in activities.   Moderately severe pain 4 Impossible to ignore for more than a few minutes. With effort, patients may still be able to manage work or participate in some social activities. Very difficult to concentrate. Signs of autonomic nervous system discharge are evident: dilated pupils (mydriasis); mild sweating (diaphoresis); sleep interference. Heart rate becomes elevated (>115 bpm). Diastolic blood pressure (lower number) rises above 100 mmHg. Patients find relief in laying down and not moving.   Severe pain 5 Intense and extremely unpleasant. Associated with frowning face and frequent crying. Pain overwhelms the senses.  Ability to do any activity or maintain social relationships becomes significantly limited. Conversation becomes difficult. Pacing back and forth is common, as getting into a comfortable position is nearly impossible. Pain wakes you up from deep sleep. Physical signs will be obvious: pupillary dilation; increased sweating; goosebumps; brisk reflexes; cold, clammy hands and feet; nausea, vomiting or dry heaves; loss of appetite; significant sleep disturbance with inability to fall asleep or to remain asleep. When persistent, significant weight loss is observed due to the complete loss of appetite and sleep deprivation.  Blood pressure and heart   rate becomes significantly elevated. Caution: If elevated blood pressure triggers a pounding headache associated with blurred vision, then the patient should immediately seek attention at an urgent or  emergency care unit, as these may be signs of an impending stroke.    Emergency Department Pain Levels (6-10/10)  Emergency Room Pain 6 Severely limiting. Requires emergency care and should not be seen or managed at an outpatient pain management facility. Communication becomes difficult and requires great effort. Assistance to reach the emergency department may be required. Facial flushing and profuse sweating along with potentially dangerous increases in heart rate and blood pressure will be evident.   Distressing pain 7 Self-care is very difficult. Assistance is required to transport, or use restroom. Assistance to reach the emergency department will be required. Tasks requiring coordination, such as bathing and getting dressed become very difficult.   Disabling pain 8 Self-care is no longer possible. At this level, pain is disabling. The individual is unable to do even the most "basic" activities such as walking, eating, bathing, dressing, transferring to a bed, or toileting. Fine motor skills are lost. It is difficult to think clearly.   Incapacitating pain 9 Pain becomes incapacitating. Thought processing is no longer possible. Difficult to remember your own name. Control of movement and coordination are lost.   The worst pain imaginable 10 At this level, most patients pass out from pain. When this level is reached, collapse of the autonomic nervous system occurs, leading to a sudden drop in blood pressure and heart rate. This in turn results in a temporary and dramatic drop in blood flow to the brain, leading to a loss of consciousness. Fainting is one of the body's self defense mechanisms. Passing out puts the brain in a calmed state and causes it to shut down for a while, in order to begin the healing process.    Summary: 1. Refer to this scale when providing us with your pain level. 2. Be accurate and careful when reporting your pain level. This will help with your care. 3. Over-reporting  your pain level will lead to loss of credibility. 4. Even a level of 1/10 means that there is pain and will be treated at our facility. 5. High, inaccurate reporting will be documented as "Symptom Exaggeration", leading to loss of credibility and suspicions of possible secondary gains such as obtaining more narcotics, or wanting to appear disabled, for fraudulent reasons. 6. Only pain levels of 5 or below will be seen at our facility. 7. Pain levels of 6 and above will be sent to the Emergency Department and the appointment cancelled. _____________________________________________________________________________________________  GENERAL RISKS AND COMPLICATIONS  What are the risk, side effects and possible complications? Generally speaking, most procedures are safe.  However, with any procedure there are risks, side effects, and the possibility of complications.  The risks and complications are dependent upon the sites that are lesioned, or the type of nerve block to be performed.  The closer the procedure is to the spine, the more serious the risks are.  Great care is taken when placing the radio frequency needles, block needles or lesioning probes, but sometimes complications can occur. 1. Infection: Any time there is an injection through the skin, there is a risk of infection.  This is why sterile conditions are used for these blocks.  There are four possible types of infection. 1. Localized skin infection. 2. Central Nervous System Infection-This can be in the form of Meningitis, which can be deadly. 3. Epidural Infections-This can   be in the form of an epidural abscess, which can cause pressure inside of the spine, causing compression of the spinal cord with subsequent paralysis. This would require an emergency surgery to decompress, and there are no guarantees that the patient would recover from the paralysis. 4. Discitis-This is an infection of the intervertebral discs.  It occurs in about 1% of  discography procedures.  It is difficult to treat and it may lead to surgery.        2. Pain: the needles have to go through skin and soft tissues, will cause soreness.       3. Damage to internal structures:  The nerves to be lesioned may be near blood vessels or    other nerves which can be potentially damaged.       4. Bleeding: Bleeding is more common if the patient is taking blood thinners such as  aspirin, Coumadin, Ticiid, Plavix, etc., or if he/she have some genetic predisposition  such as hemophilia. Bleeding into the spinal canal can cause compression of the spinal  cord with subsequent paralysis.  This would require an emergency surgery to  decompress and there are no guarantees that the patient would recover from the  paralysis.       5. Pneumothorax:  Puncturing of a lung is a possibility, every time a needle is introduced in  the area of the chest or upper back.  Pneumothorax refers to free air around the  collapsed lung(s), inside of the thoracic cavity (chest cavity).  Another two possible  complications related to a similar event would include: Hemothorax and Chylothorax.   These are variations of the Pneumothorax, where instead of air around the collapsed  lung(s), you may have blood or chyle, respectively.       6. Spinal headaches: They may occur with any procedures in the area of the spine.       7. Persistent CSF (Cerebro-Spinal Fluid) leakage: This is a rare problem, but may occur  with prolonged intrathecal or epidural catheters either due to the formation of a fistulous  track or a dural tear.       8. Nerve damage: By working so close to the spinal cord, there is always a possibility of  nerve damage, which could be as serious as a permanent spinal cord injury with  paralysis.       9. Death:  Although rare, severe deadly allergic reactions known as "Anaphylactic  reaction" can occur to any of the medications used.      10. Worsening of the symptoms:  We can always make thing  worse.  What are the chances of something like this happening? Chances of any of this occuring are extremely low.  By statistics, you have more of a chance of getting killed in a motor vehicle accident: while driving to the hospital than any of the above occurring .  Nevertheless, you should be aware that they are possibilities.  In general, it is similar to taking a shower.  Everybody knows that you can slip, hit your head and get killed.  Does that mean that you should not shower again?  Nevertheless always keep in mind that statistics do not mean anything if you happen to be on the wrong side of them.  Even if a procedure has a 1 (one) in a 1,000,000 (million) chance of going wrong, it you happen to be that one..Also, keep in mind that by statistics, you have more of a chance of having   something go wrong when taking medications.  Who should not have this procedure? If you are on a blood thinning medication (e.g. Coumadin, Plavix, see list of "Blood Thinners"), or if you have an active infection going on, you should not have the procedure.  If you are taking any blood thinners, please inform your physician.  How should I prepare for this procedure?  Do not eat or drink anything at least six hours prior to the procedure.  Bring a driver with you .  It cannot be a taxi.  Come accompanied by an adult that can drive you back, and that is strong enough to help you if your legs get weak or numb from the local anesthetic.  Take all of your medicines the morning of the procedure with just enough water to swallow them.  If you have diabetes, make sure that you are scheduled to have your procedure done first thing in the morning, whenever possible.  If you have diabetes, take only half of your insulin dose and notify our nurse that you have done so as soon as you arrive at the clinic.  If you are diabetic, but only take blood sugar pills (oral hypoglycemic), then do not take them on the morning of your  procedure.  You may take them after you have had the procedure.  Do not take aspirin or any aspirin-containing medications, at least eleven (11) days prior to the procedure.  They may prolong bleeding.  Wear loose fitting clothing that may be easy to take off and that you would not mind if it got stained with Betadine or blood.  Do not wear any jewelry or perfume  Remove any nail coloring.  It will interfere with some of our monitoring equipment.  NOTE: Remember that this is not meant to be interpreted as a complete list of all possible complications.  Unforeseen problems may occur.  BLOOD THINNERS The following drugs contain aspirin or other products, which can cause increased bleeding during surgery and should not be taken for 2 weeks prior to and 1 week after surgery.  If you should need take something for relief of minor pain, you may take acetaminophen which is found in Tylenol,m Datril, Anacin-3 and Panadol. It is not blood thinner. The products listed below are.  Do not take any of the products listed below in addition to any listed on your instruction sheet.  A.P.C or A.P.C with Codeine Codeine Phosphate Capsules #3 Ibuprofen Ridaura  ABC compound Congesprin Imuran rimadil  Advil Cope Indocin Robaxisal  Alka-Seltzer Effervescent Pain Reliever and Antacid Coricidin or Coricidin-D  Indomethacin Rufen  Alka-Seltzer plus Cold Medicine Cosprin Ketoprofen S-A-C Tablets  Anacin Analgesic Tablets or Capsules Coumadin Korlgesic Salflex  Anacin Extra Strength Analgesic tablets or capsules CP-2 Tablets Lanoril Salicylate  Anaprox Cuprimine Capsules Levenox Salocol  Anexsia-D Dalteparin Magan Salsalate  Anodynos Darvon compound Magnesium Salicylate Sine-off  Ansaid Dasin Capsules Magsal Sodium Salicylate  Anturane Depen Capsules Marnal Soma  APF Arthritis pain formula Dewitt's Pills Measurin Stanback  Argesic Dia-Gesic Meclofenamic Sulfinpyrazone  Arthritis Bayer Timed Release Aspirin  Diclofenac Meclomen Sulindac  Arthritis pain formula Anacin Dicumarol Medipren Supac  Analgesic (Safety coated) Arthralgen Diffunasal Mefanamic Suprofen  Arthritis Strength Bufferin Dihydrocodeine Mepro Compound Suprol  Arthropan liquid Dopirydamole Methcarbomol with Aspirin Synalgos  ASA tablets/Enseals Disalcid Micrainin Tagament  Ascriptin Doan's Midol Talwin  Ascriptin A/D Dolene Mobidin Tanderil  Ascriptin Extra Strength Dolobid Moblgesic Ticlid  Ascriptin with Codeine Doloprin or Doloprin with Codeine Momentum Tolectin  Asperbuf Duoprin Mono-gesic Trendar  Aspergum Duradyne Motrin or Motrin IB Triminicin  Aspirin plain, buffered or enteric coated Durasal Myochrisine Trigesic  Aspirin Suppositories Easprin Nalfon Trillsate  Aspirin with Codeine Ecotrin Regular or Extra Strength Naprosyn Uracel  Atromid-S Efficin Naproxen Ursinus  Auranofin Capsules Elmiron Neocylate Vanquish  Axotal Emagrin Norgesic Verin  Azathioprine Empirin or Empirin with Codeine Normiflo Vitamin E  Azolid Emprazil Nuprin Voltaren  Bayer Aspirin plain, buffered or children's or timed BC Tablets or powders Encaprin Orgaran Warfarin Sodium  Buff-a-Comp Enoxaparin Orudis Zorpin  Buff-a-Comp with Codeine Equegesic Os-Cal-Gesic   Buffaprin Excedrin plain, buffered or Extra Strength Oxalid   Bufferin Arthritis Strength Feldene Oxphenbutazone   Bufferin plain or Extra Strength Feldene Capsules Oxycodone with Aspirin   Bufferin with Codeine Fenoprofen Fenoprofen Pabalate or Pabalate-SF   Buffets II Flogesic Panagesic   Buffinol plain or Extra Strength Florinal or Florinal with Codeine Panwarfarin   Buf-Tabs Flurbiprofen Penicillamine   Butalbital Compound Four-way cold tablets Penicillin   Butazolidin Fragmin Pepto-Bismol   Carbenicillin Geminisyn Percodan   Carna Arthritis Reliever Geopen Persantine   Carprofen Gold's salt Persistin   Chloramphenicol Goody's Phenylbutazone   Chloromycetin Haltrain Piroxlcam    Clmetidine heparin Plaquenil   Cllnoril Hyco-pap Ponstel   Clofibrate Hydroxy chloroquine Propoxyphen         Before stopping any of these medications, be sure to consult the physician who ordered them.  Some, such as Coumadin (Warfarin) are ordered to prevent or treat serious conditions such as "deep thrombosis", "pumonary embolisms", and other heart problems.  The amount of time that you may need off of the medication may also vary with the medication and the reason for which you were taking it.  If you are taking any of these medications, please make sure you notify your pain physician before you undergo any procedures.         GENERAL RISKS AND COMPLICATIONS  What are the risk, side effects and possible complications? Generally speaking, most procedures are safe.  However, with any procedure there are risks, side effects, and the possibility of complications.  The risks and complications are dependent upon the sites that are lesioned, or the type of nerve block to be performed.  The closer the procedure is to the spine, the more serious the risks are.  Great care is taken when placing the radio frequency needles, block needles or lesioning probes, but sometimes complications can occur. 2. Infection: Any time there is an injection through the skin, there is a risk of infection.  This is why sterile conditions are used for these blocks.  There are four possible types of infection. 1. Localized skin infection. 2. Central Nervous System Infection-This can be in the form of Meningitis, which can be deadly. 3. Epidural Infections-This can be in the form of an epidural abscess, which can cause pressure inside of the spine, causing compression of the spinal cord with subsequent paralysis. This would require an emergency surgery to decompress, and there are no guarantees that the patient would recover from the paralysis. 4. Discitis-This is an infection of the intervertebral discs.  It occurs in  about 1% of discography procedures.  It is difficult to treat and it may lead to surgery.        2. Pain: the needles have to go through skin and soft tissues, will cause soreness.       3. Damage to internal structures:  The nerves to be lesioned may be near blood vessels or  other nerves which can be potentially damaged.       4. Bleeding: Bleeding is more common if the patient is taking blood thinners such as  aspirin, Coumadin, Ticiid, Plavix, etc., or if he/she have some genetic predisposition  such as hemophilia. Bleeding into the spinal canal can cause compression of the spinal  cord with subsequent paralysis.  This would require an emergency surgery to  decompress and there are no guarantees that the patient would recover from the  paralysis.       5. Pneumothorax:  Puncturing of a lung is a possibility, every time a needle is introduced in  the area of the chest or upper back.  Pneumothorax refers to free air around the  collapsed lung(s), inside of the thoracic cavity (chest cavity).  Another two possible  complications related to a similar event would include: Hemothorax and Chylothorax.   These are variations of the Pneumothorax, where instead of air around the collapsed  lung(s), you may have blood or chyle, respectively.       6. Spinal headaches: They may occur with any procedures in the area of the spine.       7. Persistent CSF (Cerebro-Spinal Fluid) leakage: This is a rare problem, but may occur  with prolonged intrathecal or epidural catheters either due to the formation of a fistulous  track or a dural tear.       8. Nerve damage: By working so close to the spinal cord, there is always a possibility of  nerve damage, which could be as serious as a permanent spinal cord injury with  paralysis.       9. Death:  Although rare, severe deadly allergic reactions known as "Anaphylactic  reaction" can occur to any of the medications used.      10. Worsening of the symptoms:  We can always  make thing worse.  What are the chances of something like this happening? Chances of any of this occuring are extremely low.  By statistics, you have more of a chance of getting killed in a motor vehicle accident: while driving to the hospital than any of the above occurring .  Nevertheless, you should be aware that they are possibilities.  In general, it is similar to taking a shower.  Everybody knows that you can slip, hit your head and get killed.  Does that mean that you should not shower again?  Nevertheless always keep in mind that statistics do not mean anything if you happen to be on the wrong side of them.  Even if a procedure has a 1 (one) in a 1,000,000 (million) chance of going wrong, it you happen to be that one..Also, keep in mind that by statistics, you have more of a chance of having something go wrong when taking medications.  Who should not have this procedure? If you are on a blood thinning medication (e.g. Coumadin, Plavix, see list of "Blood Thinners"), or if you have an active infection going on, you should not have the procedure.  If you are taking any blood thinners, please inform your physician.  How should I prepare for this procedure?  Do not eat or drink anything at least six hours prior to the procedure.  Bring a driver with you .  It cannot be a taxi.  Come accompanied by an adult that can drive you back, and that is strong enough to help you if your legs get weak or numb from the local anesthetic.  Take all  of your medicines the morning of the procedure with just enough water to swallow them.  If you have diabetes, make sure that you are scheduled to have your procedure done first thing in the morning, whenever possible.  If you have diabetes, take only half of your insulin dose and notify our nurse that you have done so as soon as you arrive at the clinic.  If you are diabetic, but only take blood sugar pills (oral hypoglycemic), then do not take them on the  morning of your procedure.  You may take them after you have had the procedure.  Do not take aspirin or any aspirin-containing medications, at least eleven (11) days prior to the procedure.  They may prolong bleeding.  Wear loose fitting clothing that may be easy to take off and that you would not mind if it got stained with Betadine or blood.  Do not wear any jewelry or perfume  Remove any nail coloring.  It will interfere with some of our monitoring equipment.  NOTE: Remember that this is not meant to be interpreted as a complete list of all possible complications.  Unforeseen problems may occur.  BLOOD THINNERS The following drugs contain aspirin or other products, which can cause increased bleeding during surgery and should not be taken for 2 weeks prior to and 1 week after surgery.  If you should need take something for relief of minor pain, you may take acetaminophen which is found in Tylenol,m Datril, Anacin-3 and Panadol. It is not blood thinner. The products listed below are.  Do not take any of the products listed below in addition to any listed on your instruction sheet.  A.P.C or A.P.C with Codeine Codeine Phosphate Capsules #3 Ibuprofen Ridaura  ABC compound Congesprin Imuran rimadil  Advil Cope Indocin Robaxisal  Alka-Seltzer Effervescent Pain Reliever and Antacid Coricidin or Coricidin-D  Indomethacin Rufen  Alka-Seltzer plus Cold Medicine Cosprin Ketoprofen S-A-C Tablets  Anacin Analgesic Tablets or Capsules Coumadin Korlgesic Salflex  Anacin Extra Strength Analgesic tablets or capsules CP-2 Tablets Lanoril Salicylate  Anaprox Cuprimine Capsules Levenox Salocol  Anexsia-D Dalteparin Magan Salsalate  Anodynos Darvon compound Magnesium Salicylate Sine-off  Ansaid Dasin Capsules Magsal Sodium Salicylate  Anturane Depen Capsules Marnal Soma  APF Arthritis pain formula Dewitt's Pills Measurin Stanback  Argesic Dia-Gesic Meclofenamic Sulfinpyrazone  Arthritis Bayer Timed  Release Aspirin Diclofenac Meclomen Sulindac  Arthritis pain formula Anacin Dicumarol Medipren Supac  Analgesic (Safety coated) Arthralgen Diffunasal Mefanamic Suprofen  Arthritis Strength Bufferin Dihydrocodeine Mepro Compound Suprol  Arthropan liquid Dopirydamole Methcarbomol with Aspirin Synalgos  ASA tablets/Enseals Disalcid Micrainin Tagament  Ascriptin Doan's Midol Talwin  Ascriptin A/D Dolene Mobidin Tanderil  Ascriptin Extra Strength Dolobid Moblgesic Ticlid  Ascriptin with Codeine Doloprin or Doloprin with Codeine Momentum Tolectin  Asperbuf Duoprin Mono-gesic Trendar  Aspergum Duradyne Motrin or Motrin IB Triminicin  Aspirin plain, buffered or enteric coated Durasal Myochrisine Trigesic  Aspirin Suppositories Easprin Nalfon Trillsate  Aspirin with Codeine Ecotrin Regular or Extra Strength Naprosyn Uracel  Atromid-S Efficin Naproxen Ursinus  Auranofin Capsules Elmiron Neocylate Vanquish  Axotal Emagrin Norgesic Verin  Azathioprine Empirin or Empirin with Codeine Normiflo Vitamin E  Azolid Emprazil Nuprin Voltaren  Bayer Aspirin plain, buffered or children's or timed BC Tablets or powders Encaprin Orgaran Warfarin Sodium  Buff-a-Comp Enoxaparin Orudis Zorpin  Buff-a-Comp with Codeine Equegesic Os-Cal-Gesic   Buffaprin Excedrin plain, buffered or Extra Strength Oxalid   Bufferin Arthritis Strength Feldene Oxphenbutazone   Bufferin plain or Extra Strength Feldene Capsules Oxycodone  with Aspirin   Bufferin with Codeine Fenoprofen Fenoprofen Pabalate or Pabalate-SF   Buffets II Flogesic Panagesic   Buffinol plain or Extra Strength Florinal or Florinal with Codeine Panwarfarin   Buf-Tabs Flurbiprofen Penicillamine   Butalbital Compound Four-way cold tablets Penicillin   Butazolidin Fragmin Pepto-Bismol   Carbenicillin Geminisyn Percodan   Carna Arthritis Reliever Geopen Persantine   Carprofen Gold's salt Persistin   Chloramphenicol Goody's Phenylbutazone   Chloromycetin  [place] Piroxlcam   Clmetidine heparin Plaquenil   Cllnoril Hyco-pap Ponstel   Clofibrate Hydroxy chloroquine Propoxyphen         Before stopping any of these medications, be sure to consult the physician who ordered them.  Some, such as Coumadin (Warfarin) are ordered to prevent or treat serious conditions such as "deep thrombosis", "pumonary embolisms", and other heart problems.  The amount of time that you may need off of the medication may also vary with the medication and the reason for which you were taking it.  If you are taking any of these medications, please make sure you notify your pain physician before you undergo any procedures.

## 2016-06-29 NOTE — Progress Notes (Signed)
Patient's Name: Kelli Gutierrez  MRN: [id number]  Referring Provider: [facility]  DOB: 08/28/1950  PCP: [facility]  DOS: 06/29/2016  Note by: Kathlen Brunswick. Dossie Arbour, MD  Service setting: Ambulatory outpatient  Specialty: Interventional Pain Management  Location: [facility] (AMB) Pain Management Facility    Patient type: Established   Primary Reason(s) for Visit: Encounter for evaluation before starting new chronic pain management plan of care (Level of risk: moderate) CC: Shoulder Pain (bilateral, left is worse)  HPI  Kelli Gutierrez is a 66 y.o. year old, female patient, who comes today for a follow-up evaluation to review the test results and decide on a treatment plan. She has Chronic shoulder pain (Location of Primary Source of Pain) (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); COPD, mild ([place]); Diabetes mellitus type 2, uncomplicated ([place]); Essential hypertension with goal blood pressure less than 130/80; History of CVA (cerebrovascular accident); History of stroke; Iron deficiency anemia; Rheumatoid arthritis ([place]); Chronic upper extremity numbness  (Right); Spinal stenosis of lumbar region with neurogenic claudication; Chronic pain syndrome; Long term current use of opiate analgesic; Long term prescription opiate use; Opiate use; Encounter for pain management planning; Encounter for therapeutic drug level monitoring; Current use of long term anticoagulation; Hyperlipidemia; GERD (gastroesophageal reflux disease); Acute posterior neck pain (Left); Acute bilateral low back pain; History of left Thalamic infarction ([place]); Whiplash injury, acute, sequela; Stage III chronic kidney disease; and Vitamin D deficiency on her problem list. Her primarily concern today is the Shoulder Pain (bilateral, left is worse)  Pain Assessment: Self-Reported Pain Score: 6 /10 Clinically the patient looks like a 2/10 Reported level is inconsistent with clinical observations. Information on the proper use of the pain scale  provided to the patient today Pain Type: Chronic pain Pain Location: Shoulder Pain Orientation: Right, Left (left is worse.) Pain Descriptors / Indicators: Discomfort, Nagging, Tiring, Squeezing Pain Frequency: Constant  Kelli Gutierrez comes in today for a follow-up visit after her initial evaluation on 03/18/2016. Today we went over the results of her tests. These were explained in "Layman's terms". During today's appointment we went over my diagnostic impression, as well as the proposed treatment plan.  In considering the treatment plan options, Kelli Gutierrez was reminded that I no longer take patients for medication management only. I asked her to let me know if she had no intention of taking advantage of the interventional therapies, so that we could make arrangements to provide this space to someone interested. I also made it clear that undergoing interventional therapies for the purpose of getting pain medications is very inappropriate on the part of a patient, and it will not be tolerated in this practice. This type of behavior would suggest true addiction and therefore it requires referral to an addiction specialist.   Further details on both, my assessment(s), as well as the proposed treatment plan, please see below. Controlled Substance Pharmacotherapy Assessment REMS (Risk Evaluation and Mitigation Strategy)  Analgesic: Tramadol 50 mg 1 tablet by mouth twice a day Highest recorded MME/day: 30 mg/day Current MME/day: 10 mg/day Pill Count: None expected due to no prior prescriptions written by our practice. Pharmacokinetics: Liberation and absorption (onset of action): WNL Distribution (time to peak effect): WNL Metabolism and excretion (duration of action): WNL         Pharmacodynamics: Desired effects: Analgesia: Kelli Gutierrez reports >50% benefit. Functional ability: Patient reports that medication allows her to accomplish basic ADLs Clinically meaningful improvement in function (CMIF):  Sustained CMIF goals met Perceived effectiveness: Described as relatively  effective, allowing for increase in activities of daily living (ADL) Undesirable effects: Side-effects or Adverse reactions: None reported Monitoring: [name]: Online review of the past 69-monthperiod previously conducted. Not applicable at this point since we have not taken over the patient's medication management yet. List of all Serum Drug Screening Test(s):  No results found for: AMPHSCRSER, BARBSCRSER, BENZOSCRSER, COCAINSCRSER, PCPSCRSER, THCSCRSER, OPIATESCRSER, [place] of [place] UDS test(s) done:  Lab Results  Component Value Date   SUMMARY FINAL 03/18/2016   Last UDS on record: Summary  Date Value Ref Range Status  03/18/2016 FINAL  Final    Comment:    ==================================================================== TOXASSURE COMP DRUG ANALYSIS,UR ==================================================================== Test                             Result       Flag       Units Drug Present and Declared for Prescription Verification   Metoprolol                     PRESENT      EXPECTED Drug Absent but Declared for Prescription Verification   Tramadol                       Not Detected UNEXPECTED   Salicylate                     Not Detected UNEXPECTED    Aspirin, as indicated in the declared medication list, is not    always detected even when used as directed. ==================================================================== Test                      Result    Flag   Units      Ref Range   Creatinine              83               mg/dL      >=20 ==================================================================== Declared Medications:  The flagging and interpretation on this report are based on the  following declared medications.  Unexpected results may arise from  inaccuracies in the declared medications.  **Note: The testing scope of this panel includes these  medications:  Metoprolol (Lopressor)  Tramadol (Ultram)  **Note: The testing scope of this panel does not include small to  moderate amounts of these reported medications:  Aspirin (Aspirin 81)  **Note: The testing scope of this panel does not include following  reported medications:  Atorvastatin (Lipitor)  Clopidogrel (Plavix)  Hydroxychloroquine (Plaquenil)  Lisinopril (Prinivil)  Metformin (Glucophage)  Ranitidine (Zantac)  Sitagliptin (Januvia) ==================================================================== For clinical consultation, please call ([phone] ====================================================================    UDS interpretation: No unexpected findings.          Medication Assessment Form: Patient introduced to form today Treatment compliance: Treatment may start today if patient agrees with proposed plan. Evaluation of compliance is not applicable at this point Risk Assessment Profile: Aberrant behavior: See initial evaluations. None observed or detected today Comorbid factors increasing risk of overdose: See initial evaluation. No additional risks detected today Risk Mitigation Strategies:  Patient opioid safety counseling: Completed today. Counseling provided to patient as per "Patient Counseling Document". Document signed by patient, attesting to counseling and understanding Patient-Prescriber Agreement (PPA): Obtained today  Controlled substance notification to other providers: Written and sent today  Pharmacologic Plan: Today we may be taking  over the patient's pharmacological regimen. See below  Laboratory Chemistry  Inflammation Markers Lab Results  Component Value Date   CRP <0.8 03/23/2016   ESRSEDRATE 47 (H) 03/23/2016   (CRP: Acute Phase) (ESR: Chronic Phase) Renal Function Markers Lab Results  Component Value Date   BUN 27 (H) 03/23/2016   CREATININE 1.34 (H) 03/23/2016   GFRAA 47 (L) 03/23/2016   GFRNONAA 41 (L) 03/23/2016    Hepatic Function Markers Lab Results  Component Value Date   AST 20 03/23/2016   ALT 16 03/23/2016   ALBUMIN 3.9 03/23/2016   ALKPHOS 64 03/23/2016   Electrolytes Lab Results  Component Value Date   NA 137 03/23/2016   K 4.4 03/23/2016   CL 107 03/23/2016   CALCIUM 9.0 03/23/2016   MG 1.7 03/23/2016   Neuropathy Markers Lab Results  Component Value Date   VITAMINB12 1,816 (H) 03/23/2016   Bone Pathology Markers Lab Results  Component Value Date   ALKPHOS 64 03/23/2016   25OHVITD1 19 (L) 03/23/2016   25OHVITD2 <1.0 03/23/2016   25OHVITD3 19 03/23/2016   CALCIUM 9.0 03/23/2016   Coagulation Parameters Lab Results  Component Value Date   INR 0.93 12/18/2014   LABPROT 12.7 12/18/2014   APTT 30 12/17/2014   PLT 251 12/18/2014   Cardiovascular Markers Lab Results  Component Value Date   BNP 75 10/10/2012   HGB 13.1 12/18/2014   HCT 39.3 12/18/2014   Note: Lab results reviewed.  Recent Diagnostic Imaging Review  Shoulder Imaging: Shoulder-L MR wo contrast:  Results for orders placed during the hospital encounter of 03/06/16  MR SHOULDER LEFT WO CONTRAST   Narrative CLINICAL DATA:  Left shoulder pain for 1 year radiating down the left arm to the elbow. Limited and painful range of motion.  EXAM: MRI OF THE LEFT SHOULDER WITHOUT CONTRAST  TECHNIQUE: Multiplanar, multisequence MR imaging of the shoulder was performed. No intravenous contrast was administered.  COMPARISON:  09/23/2008 CT arthrogram of the left shoulder.  FINDINGS: Rotator cuff: Full-thickness partial width tear of the anterior supraspinatus tendon, otherwise with moderate supraspinatus and mild infraspinatus and subscapularis tendinopathy.  Muscles:  Unremarkable  Biceps long head: Moderate tendinopathy of the intra-articular segment.  Acromioclavicular Joint: Mild spurring Type II acromion. As expected there is fluid in the subacromial subdeltoid bursa. There is also considerable  subacromial spurring.  Glenohumeral Joint: Small joint effusion. Indistinct coracohumeral ligament, possible synovitis in the rotator interval. Minimal spurring of the humeral head.  Labrum:  Grossly unremarkable  Bones: No significant extra-articular osseous abnormalities identified.  Other: No supplemental non-categorized findings.  IMPRESSION: 1. Full-thickness partial width tear of the anterior supraspinatus tendon, with moderate supraspinatus and mild infraspinatus and subscapularis tendinopathy. 2. Moderate biceps tendinopathy. 3. Small glenohumeral joint effusion communicates with the subacromial subdeltoid bursa. 4. Mild synovitis in the rotator interval.   Electronically Signed   By: Van Clines M.D.   On: 03/06/2016 17:49    Shoulder-L CT wo contrast:  Results for orders placed in visit on 09/23/08  CT Shoulder Left Wo Contrast   Narrative * PRIOR REPORT IMPORTED FROM AN EXTERNAL SYSTEM *   PRIOR REPORT IMPORTED FROM THE [facility] WORKFLOW SYSTEM   REASON FOR EXAM:    STAT READ Left Shoulder Pain RCT  Diabetic Metformin  On  Aspirin Products  COMMENTS:   PROCEDURE:     CT  - CT SHOULDER LEFT WO  - Sep 23 2008 11:05AM   RESULT:     History:  Shoulder  pain   Comparison: No comparison   Technique:  Multiple axial images are obtained of the left shoulder  following intra-articular injection of dilute Optiray 320 with sagittal  and  coronal reformatted images provided..   Findings:   There is no fracture or dislocation.   The subscapularis tendon is intact. There is a full-thickness tear of the  a  posterior fibers of the supraspinatus tendon. The infraspinatus tendon  appears intact. The teres minor tendon is intact.   There is no atrophy or fatty replacement of the muscles of the rotator  cuff.   The labrum is grossly intact.   The intraarticular and extraarticular portions of the biceps tendon are  grossly intact.   The acromioclavicular  joint is unremarkable.   The visualized portions of the left lung are clear.   IMPRESSION:   1. There is a full-thickness tear of the a posterior fibers of the  supraspinatus tendon.       Lumbosacral Imaging: Lumbar CT wo contrast:  Results for orders placed in visit on 10/16/13  CT Lumbar Spine Wo Contrast   Narrative * PRIOR REPORT IMPORTED FROM AN EXTERNAL SYSTEM *   CLINICAL DATA:  Low back and buttock pain extending to both legs.  Unable to have MR scan secondary to prior ear surgery.   EXAM:  CT LUMBAR SPINE WITHOUT CONTRAST   TECHNIQUE:  Multidetector CT imaging of the lumbar spine was performed without  intravenous contrast administration. Multiplanar CT image  reconstructions were also generated.   COMPARISON:  12/01/2007 CT.   FINDINGS:  Last fully open disk space is labeled L5-S1. Present examination  incorporates from T12-L1 disc space through the lower sacrum.   Atherosclerotic type changes of the aorta and iliac arteries without  aneurysmal dilation.   T12-L1:  Mild facet joint degenerative changes.   L1-2: Facet joint degenerative changes. Calcification right  ligamentum flavum. Minimal impression right posterior lateral aspect  of the thecal sac.   L2-3: Facet joint degenerative changes. Ligamentum flavum  hypertrophy with calcification greater on the left. Minimal  indentation upon the adjacent thecal sac. Mild bulge. Multifactorial  mild to slightly moderate spinal stenosis and lateral recess  stenosis.   L3-4: Bulge with peripheral calcification. Mild facet joint  degenerative changes. Ligamentum flavum hypertrophy slightly greater  on the left. Multifactorial mild spinal stenosis and lateral recess  stenosis slightly greater on left.   L4-5: Prominent facet joint degenerative changes. 1.2 mm anterior  slip L4. Ligamentum flavum hypertrophy with calcification greater on  the left. Bulge. Multifactorial moderate spinal stenosis and lateral   recess stenosis greater on the left. Mild bilateral foraminal  narrowing.   L5-S1: Facet joint degenerative changes. Bulge with peripheral  calcification and slight caudal extension centrally. Mild to  slightly moderate lateral recess stenosis. Mild bilateral foraminal  narrowing. No significant thecal sac compromise.   Mild bilateral sacroiliac joint degenerative changes   IMPRESSION:  Degenerative changes most prominent L4-5 level.   Summary of pertinent findings include:   L2-3 multifactorial mild to slightly moderate spinal stenosis and  lateral recess stenosis.   L3-4 multifactorial mild spinal stenosis and lateral recess stenosis  slightly greater on left.   L4-5 multifactorial moderate spinal stenosis and lateral recess  stenosis greater on the left. Mild bilateral foraminal narrowing.   L5-S1 multifactorial mild to slightly moderate lateral recess  stenosis. Mild bilateral foraminal narrowing.   Mild bilateral sacroiliac joint degenerative changes.   Please see above for further detail.  Electronically Signed    By: Chauncey Cruel M.D.    On: 10/16/2013 12:57       Note: Results of ordered imaging test(s) reviewed and explained to patient in Layman's terms. Copy of results provided to patient  Meds  The patient has a current medication list which includes the following prescription(s): aspirin, atorvastatin, vitamin d3, clopidogrel, hydroxychloroquine, lisinopril, metformin, metoprolol, ranitidine, sitagliptin, tramadol, and vitamin d (ergocalciferol).  Current Outpatient Prescriptions on File Prior to Visit  Medication Sig  . aspirin 81 MG chewable tablet Chew 81 mg by mouth daily.  Marland Kitchen atorvastatin (LIPITOR) 40 MG tablet Take 20 mg by mouth daily.   . clopidogrel (PLAVIX) 75 MG tablet Take 75 mg by mouth daily.  . hydroxychloroquine (PLAQUENIL) 200 MG tablet Take 200 mg by mouth daily.  Marland Kitchen lisinopril (PRINIVIL,ZESTRIL) 20 MG tablet Take 20 mg by mouth daily.   . metFORMIN (GLUCOPHAGE) 1000 MG tablet Take 1,000 mg by mouth 2 (two) times daily with a meal.  . metoprolol (LOPRESSOR) 50 MG tablet Take 50 mg by mouth 2 (two) times daily.  . ranitidine (ZANTAC) 150 MG capsule Take 150 mg by mouth 2 (two) times daily.  . sitaGLIPtin (JANUVIA) 100 MG tablet Take 100 mg by mouth daily.   No current facility-administered medications on file prior to visit.    ROS  Constitutional: Denies any fever or chills Gastrointestinal: No reported hemesis, hematochezia, vomiting, or acute GI distress Musculoskeletal: Denies any acute onset joint swelling, redness, loss of ROM, or weakness Neurological: No reported episodes of acute onset apraxia, aphasia, dysarthria, agnosia, amnesia, paralysis, loss of coordination, or loss of consciousness  Allergies  Ms. Jezek has No Known Allergies.  PFSH  Drug: Ms. [place]aston  has no drug history on file. Alcohol:  reports that she does not drink alcohol. Tobacco:  reports that she has never smoked. She has never used smokeless tobacco. Medical:  has a past medical history of Acute CVA (cerebrovascular accident) ([place]) (12/17/2014); Anemia; Breast pain, right (06/01/2015); Chronic kidney disease (12/2015); CVA (cerebral infarction) (12/17/2014); Diabetes mellitus without complication ([place]); Hypertension; Lumbar radiculitis (12/06/2013); Rheumatoid arthritis ([place]); and Stroke ([place]). Family: family history is not on file.  Past Surgical History:  Procedure Laterality Date  . APPENDECTOMY    . CESAREAN SECTION     4  . CHOLECYSTECTOMY    . MIDDLE EAR SURGERY     Constitutional Exam  General appearance: Well nourished, well developed, and well hydrated. In no apparent acute distress Vitals:   06/29/16 0824  BP: 132/64  Pulse: 73  Resp: 16  Temp: 97.5 F (36.4 C)  TempSrc: Oral  SpO2: 100%  Weight: 127 lb (57.6 kg)  Height: 5' 2"  (1.575 m)   BMI Assessment: Estimated body mass index is 23.23 kg/m as calculated from the  following:   Height as of this encounter: 5' 2"  (1.575 m).   Weight as of this encounter: 127 lb (57.6 kg).  BMI interpretation table: BMI level Category Range association with higher incidence of chronic pain  <18 kg/m2 Underweight   18.5-24.9 kg/m2 Ideal body weight   25-29.9 kg/m2 Overweight Increased incidence by 20%  30-34.9 kg/m2 Obese (Class I) Increased incidence by 68%  35-39.9 kg/m2 Severe obesity (Class II) Increased incidence by 136%  >40 kg/m2 Extreme obesity (Class III) Increased incidence by 254%   BMI Readings from Last 4 Encounters:  06/29/16 23.23 kg/m  03/18/16 22.50 kg/m  12/17/14 27.99 kg/m   Wt Readings from Last 4 Encounters:  06/29/16 127 lb (57.6 kg)  03/18/16 123 lb (55.8 kg)  12/17/14 158 lb (71.7 kg)  Psych/Mental status: Alert, oriented x 3 (person, place, & time)       Eyes: PERLA Respiratory: No evidence of acute respiratory distress  Cervical Spine Exam  Inspection: No masses, redness, or swelling Alignment: Symmetrical Functional ROM: Unrestricted ROM Stability: No instability detected Muscle strength & Tone: Functionally intact Sensory: Unimpaired Palpation: Non-contributory  Upper Extremity (UE) Exam    Side: Right upper extremity  Side: Left upper extremity  Inspection: No masses, redness, swelling, or asymmetry. No contractures  Inspection: No masses, redness, swelling, or asymmetry. No contractures  Functional ROM: Unrestricted ROM          Functional ROM: Unrestricted ROM          Muscle strength & Tone: Functionally intact  Muscle strength & Tone: Functionally intact  Sensory: Unimpaired  Sensory: Unimpaired  Palpation: Euthermic  Palpation: Euthermic  Specialized Test(s): Deferred         Specialized Test(s): Deferred          Thoracic Spine Exam  Inspection: No masses, redness, or swelling Alignment: Symmetrical Functional ROM: Unrestricted ROM Stability: No instability detected Sensory: Unimpaired Muscle strength & Tone:  Functionally intact Palpation: Non-contributory  Lumbar Spine Exam  Inspection: No masses, redness, or swelling Alignment: Symmetrical Functional ROM: Unrestricted ROM Stability: No instability detected Muscle strength & Tone: Functionally intact Sensory: Unimpaired Palpation: Non-contributory Provocative Tests: Lumbar Hyperextension and rotation test: evaluation deferred today       Patrick's Maneuver: evaluation deferred today              Gait & Posture Assessment  Ambulation: Unassisted Gait: Relatively normal for age and body habitus Posture: WNL   Lower Extremity Exam    Side: Right lower extremity  Side: Left lower extremity  Inspection: No masses, redness, swelling, or asymmetry. No contractures  Inspection: No masses, redness, swelling, or asymmetry. No contractures  Functional ROM: Unrestricted ROM          Functional ROM: Unrestricted ROM          Muscle strength & Tone: Functionally intact  Muscle strength & Tone: Functionally intact  Sensory: Unimpaired  Sensory: Unimpaired  Palpation: No palpable anomalies  Palpation: No palpable anomalies   Assessment & Plan  Primary Diagnosis & Pertinent Problem List: The primary encounter diagnosis was Chronic shoulder pain (Location of Primary Source of Pain) (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). Diagnoses of Chronic pain syndrome, Long term prescription opiate use, Current use of long term anticoagulation, and Vitamin D deficiency were also pertinent to this visit.  Visit Diagnosis: 1. Chronic shoulder pain (Location of Primary Source of Pain) (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)   2. Chronic pain syndrome   3. Long term prescription opiate use   4. Current use of long term anticoagulation   5. Vitamin D deficiency    Problems updated and reviewed during this visit: No problems updated. Problem-specific Plan(s): No problem-specific Assessment & Plan notes found for this encounter.  Assessment & plan notes cannot be loaded without a specified hospital  service.  Plan of Care  Pharmacotherapy (Medications Ordered): Meds ordered this encounter  Medications  . traMADol (ULTRAM) 50 MG tablet    Sig: Take 1 tablet (50 mg total) by mouth 2 (two) times daily.    Dispense:  60 tablet    Refill:  0    Do not place medication on "Automatic Refill". Fill one day early if pharmacy is closed on scheduled  refill date.  . Vitamin D, Ergocalciferol, (DRISDOL) 50000 units CAPS capsule    Sig: Take 1 capsule (50,000 Units total) by mouth 2 (two) times a week x 6 weeks.    Dispense:  12 capsule    Refill:  0    Do not add to the "Automatic Refill" notification system.  . Cholecalciferol (VITAMIN D3) 2000 units capsule    Sig: Take 1 capsule (2,000 Units total) by mouth daily.    Dispense:  30 capsule    Refill:  PRN    Do not add to the "Automatic Refill" notification system.   Lab-work, procedure(s), and/or referral(s): Orders Placed This Encounter  Procedures  . SUPRASCAPULAR NERVE BLOCK    Pharmacotherapy: Opioid Analgesics: We'll take over management today. See above orders Membrane stabilizer: We have discussed the possibility of optimizing this mode of therapy, if tolerated Muscle relaxant: We have discussed the possibility of a trial NSAID: We have discussed the possibility of a trial Other analgesic(s): To be determined at a later time   Interventional therapies: Planned, scheduled, and/or pending:    Diagnostic suprascapular nerve block without steroids (Stop plavix x 7-10 days prior to procedure)   Considering:   Diagnostic suprascapular nerve block without steroids  Possible bilateral suprascapular nerve radiofrequency ablation  Diagnostic left-sided cervical epidural steroid injection under fluoroscopic guidance and IV sedation  Diagnostic left cervical facet block under fluoroscopic guidance and IV sedation    PRN Procedures:   To be determined at a later time   Provider-requested follow-up: Return in about 1 month (around  07/30/2016) for (MD) Med-Mgmt, in addition, procedure (ASAP).  Future Appointments Date Time Provider [place]  07/29/2016 8:15 AM Milinda Pointer, MD [facility] None    Primary Care Physician: [facility] Location: [facility] Outpatient Pain Management Facility Note by: Kathlen Brunswick. Dossie Arbour, M.D, DABA, DABAPM, DABPM, DABIPP, FIPP Date: 06/29/2016; Time: 3:39 PM  Pain Score Disclaimer: We use the NRS-11 scale. This is a self-reported, subjective measurement of pain severity with only modest accuracy. It is used primarily to identify changes within a particular patient. It must be understood that outpatient pain scales are significantly less accurate that those used for research, where they can be applied under ideal controlled circumstances with minimal exposure to variables. In reality, the score is likely to be a combination of pain intensity and pain affect, where pain affect describes the degree of emotional arousal or changes in action readiness caused by the sensory experience of pain. Factors such as social and work situation, setting, emotional state, anxiety levels, expectation, and prior pain experience may influence pain perception and show large inter-individual differences that may also be affected by time variables.  Patient instructions provided during this appointment: Patient Instructions   Pain Score  Introduction: The pain score used by this practice is the Verbal Numerical Rating Scale (VNRS-11). This is an 11-point scale. It is for adults and children 10 years or older. There are significant differences in how the pain score is reported, used, and applied. Forget everything you learned in the past and learn this scoring system.  General Information: The scale should reflect your current level of pain. Unless you are specifically asked for the level of your worst pain, or your average pain. If you are asked for one of these two, then it should be  understood that it is over the past 24 hours.  Basic Activities of Daily Living (ADL): Personal hygiene, dressing, eating, transferring, and using restroom.  Instructions:  Most patients tend to report their level of pain as a combination of two factors, their physical pain and their psychosocial pain. This last one is also known as "suffering" and it is reflection of how physical pain affects you socially and psychologically. From now on, report them separately. From this point on, when asked to report your pain level, report only your physical pain. Use the following table for reference.  Pain Clinic Pain Levels (0-5/10)  Pain Level Score Description  No Pain 0   Mild pain 1 Nagging, annoying, but does not interfere with basic activities of daily living (ADL). Patients are able to eat, bathe, get dressed, toileting (being able to get on and off the toilet and perform personal hygiene functions), transfer (move in and out of bed or a chair without assistance), and maintain continence (able to control bladder and bowel functions). Blood pressure and heart rate are unaffected. A normal heart rate for a healthy adult ranges from 60 to 100 bpm (beats per minute).   Mild to moderate pain 2 Noticeable and distracting. Impossible to hide from other people. More frequent flare-ups. Still possible to adapt and function close to normal. It can be very annoying and may have occasional stronger flare-ups. With discipline, patients may get used to it and adapt.   Moderate pain 3 Interferes significantly with activities of daily living (ADL). It becomes difficult to feed, bathe, get dressed, get on and off the toilet or to perform personal hygiene functions. Difficult to get in and out of bed or a chair without assistance. Very distracting. With effort, it can be ignored when deeply involved in activities.   Moderately severe pain 4 Impossible to ignore for more than a few minutes. With effort, patients may still  be able to manage work or participate in some social activities. Very difficult to concentrate. Signs of autonomic nervous system discharge are evident: dilated pupils (mydriasis); mild sweating (diaphoresis); sleep interference. Heart rate becomes elevated (>115 bpm). Diastolic blood pressure (lower number) rises above 100 mmHg. Patients find relief in laying down and not moving.   Severe pain 5 Intense and extremely unpleasant. Associated with frowning face and frequent crying. Pain overwhelms the senses.  Ability to do any activity or maintain social relationships becomes significantly limited. Conversation becomes difficult. Pacing back and forth is common, as getting into a comfortable position is nearly impossible. Pain wakes you up from deep sleep. Physical signs will be obvious: pupillary dilation; increased sweating; goosebumps; brisk reflexes; cold, clammy hands and feet; nausea, vomiting or dry heaves; loss of appetite; significant sleep disturbance with inability to fall asleep or to remain asleep. When persistent, significant weight loss is observed due to the complete loss of appetite and sleep deprivation.  Blood pressure and heart rate becomes significantly elevated. Caution: If elevated blood pressure triggers a pounding headache associated with blurred vision, then the patient should immediately seek attention at an urgent or emergency care unit, as these may be signs of an impending stroke.    Emergency Department Pain Levels (6-10/10)  Emergency Room Pain 6 Severely limiting. Requires emergency care and should not be seen or managed at an outpatient pain management facility. Communication becomes difficult and requires great effort. Assistance to reach the emergency department may be required. Facial flushing and profuse sweating along with potentially dangerous increases in heart rate and blood pressure will be evident.   Distressing pain 7 Self-care is very difficult. Assistance is  required to transport, or use restroom. Assistance to  reach the emergency department will be required. Tasks requiring coordination, such as bathing and getting dressed become very difficult.   Disabling pain 8 Self-care is no longer possible. At this level, pain is disabling. The individual is unable to do even the most "basic" activities such as walking, eating, bathing, dressing, transferring to a bed, or toileting. Fine motor skills are lost. It is difficult to think clearly.   Incapacitating pain 9 Pain becomes incapacitating. Thought processing is no longer possible. Difficult to remember your own name. Control of movement and coordination are lost.   The worst pain imaginable 10 At this level, most patients pass out from pain. When this level is reached, collapse of the autonomic nervous system occurs, leading to a sudden drop in blood pressure and heart rate. This in turn results in a temporary and dramatic drop in blood flow to the brain, leading to a loss of consciousness. Fainting is one of the body's self defense mechanisms. Passing out puts the brain in a calmed state and causes it to shut down for a while, in order to begin the healing process.    Summary: 1. Refer to this scale when providing [place] with your pain level. 2. Be accurate and careful when reporting your pain level. This will help with your care. 3. Over-reporting your pain level will lead to loss of credibility. 4. Even a level of 1/10 means that there is pain and will be treated at our facility. 5. High, inaccurate reporting will be documented as "Symptom Exaggeration", leading to loss of credibility and suspicions of possible secondary gains such as obtaining more narcotics, or wanting to appear disabled, for fraudulent reasons. 6. Only pain levels of 5 or below will be seen at our facility. 7. Pain levels of 6 and above will be sent to the Emergency Department and the appointment  cancelled. _____________________________________________________________________________________________  GENERAL RISKS AND COMPLICATIONS  What are the risk, side effects and possible complications? Generally speaking, most procedures are safe.  However, with any procedure there are risks, side effects, and the possibility of complications.  The risks and complications are dependent upon the sites that are lesioned, or the type of nerve block to be performed.  The closer the procedure is to the spine, the more serious the risks are.  Great care is taken when placing the radio frequency needles, block needles or lesioning probes, but sometimes complications can occur. 1. Infection: Any time there is an injection through the skin, there is a risk of infection.  This is why sterile conditions are used for these blocks.  There are four possible types of infection. 1. Localized skin infection. 2. Central Nervous System Infection-This can be in the form of Meningitis, which can be deadly. 3. Epidural Infections-This can be in the form of an epidural abscess, which can cause pressure inside of the spine, causing compression of the spinal cord with subsequent paralysis. This would require an emergency surgery to decompress, and there are no guarantees that the patient would recover from the paralysis. 4. Discitis-This is an infection of the intervertebral discs.  It occurs in about 1% of discography procedures.  It is difficult to treat and it may lead to surgery.        2. Pain: the needles have to go through skin and soft tissues, will cause soreness.       3. Damage to internal structures:  The nerves to be lesioned may be near blood vessels or    other nerves  which can be potentially damaged.       4. Bleeding: Bleeding is more common if the patient is taking blood thinners such as  aspirin, Coumadin, Ticiid, Plavix, etc., or if he/she have some genetic predisposition  such as hemophilia. Bleeding into  the spinal canal can cause compression of the spinal  cord with subsequent paralysis.  This would require an emergency surgery to  decompress and there are no guarantees that the patient would recover from the  paralysis.       5. Pneumothorax:  Puncturing of a lung is a possibility, every time a needle is introduced in  the area of the chest or upper back.  Pneumothorax refers to free air around the  collapsed lung(s), inside of the thoracic cavity (chest cavity).  Another two possible  complications related to a similar event would include: Hemothorax and Chylothorax.   These are variations of the Pneumothorax, where instead of air around the collapsed  lung(s), you may have blood or chyle, respectively.       6. Spinal headaches: They may occur with any procedures in the area of the spine.       7. Persistent CSF (Cerebro-Spinal Fluid) leakage: This is a rare problem, but may occur  with prolonged intrathecal or epidural catheters either due to the formation of a fistulous  track or a dural tear.       8. Nerve damage: By working so close to the spinal cord, there is always a possibility of  nerve damage, which could be as serious as a permanent spinal cord injury with  paralysis.       9. Death:  Although rare, severe deadly allergic reactions known as "Anaphylactic  reaction" can occur to any of the medications used.      10. Worsening of the symptoms:  We can always make thing worse.  What are the chances of something like this happening? Chances of any of this occuring are extremely low.  By statistics, you have more of a chance of getting killed in a motor vehicle accident: while driving to the hospital than any of the above occurring .  Nevertheless, you should be aware that they are possibilities.  In general, it is similar to taking a shower.  Everybody knows that you can slip, hit your head and get killed.  Does that mean that you should not shower again?  Nevertheless always keep in mind that  statistics do not mean anything if you happen to be on the wrong side of them.  Even if a procedure has a 1 (one) in a 1,000,000 (million) chance of going wrong, it you happen to be that one..Also, keep in mind that by statistics, you have more of a chance of having something go wrong when taking medications.  Who should not have this procedure? If you are on a blood thinning medication (e.g. Coumadin, Plavix, see list of "Blood Thinners"), or if you have an active infection going on, you should not have the procedure.  If you are taking any blood thinners, please inform your physician.  How should I prepare for this procedure?  Do not eat or drink anything at least six hours prior to the procedure.  Bring a driver with you .  It cannot be a taxi.  Come accompanied by an adult that can drive you back, and that is strong enough to help you if your legs get weak or numb from the local anesthetic.  Take all of  your medicines the morning of the procedure with just enough water to swallow them.  If you have diabetes, make sure that you are scheduled to have your procedure done first thing in the morning, whenever possible.  If you have diabetes, take only half of your insulin dose and notify our nurse that you have done so as soon as you arrive at the clinic.  If you are diabetic, but only take blood sugar pills (oral hypoglycemic), then do not take them on the morning of your procedure.  You may take them after you have had the procedure.  Do not take aspirin or any aspirin-containing medications, at least eleven (11) days prior to the procedure.  They may prolong bleeding.  Wear loose fitting clothing that may be easy to take off and that you would not mind if it got stained with Betadine or blood.  Do not wear any jewelry or perfume  Remove any nail coloring.  It will interfere with some of our monitoring equipment.  NOTE: Remember that this is not meant to be interpreted as a complete  list of all possible complications.  Unforeseen problems may occur.  BLOOD THINNERS The following drugs contain aspirin or other products, which can cause increased bleeding during surgery and should not be taken for 2 weeks prior to and 1 week after surgery.  If you should need take something for relief of minor pain, you may take acetaminophen which is found in Tylenol,m Datril, Anacin-3 and Panadol. It is not blood thinner. The products listed below are.  Do not take any of the products listed below in addition to any listed on your instruction sheet.  A.P.C or A.P.C with Codeine Codeine Phosphate Capsules #3 Ibuprofen Ridaura  ABC compound Congesprin Imuran rimadil  Advil Cope Indocin Robaxisal  Alka-Seltzer Effervescent Pain Reliever and Antacid Coricidin or Coricidin-D  Indomethacin Rufen  Alka-Seltzer plus Cold Medicine Cosprin Ketoprofen S-A-C Tablets  Anacin Analgesic Tablets or Capsules Coumadin Korlgesic Salflex  Anacin Extra Strength Analgesic tablets or capsules CP-2 Tablets Lanoril Salicylate  Anaprox Cuprimine Capsules Levenox Salocol  Anexsia-D Dalteparin Magan Salsalate  Anodynos Darvon compound Magnesium Salicylate Sine-off  Ansaid Dasin Capsules Magsal Sodium Salicylate  Anturane Depen Capsules Marnal Soma  APF Arthritis pain formula Dewitt's Pills Measurin Stanback  Argesic Dia-Gesic Meclofenamic Sulfinpyrazone  Arthritis Bayer Timed Release Aspirin Diclofenac Meclomen Sulindac  Arthritis pain formula Anacin Dicumarol Medipren Supac  Analgesic (Safety coated) Arthralgen Diffunasal Mefanamic Suprofen  Arthritis Strength Bufferin Dihydrocodeine Mepro Compound Suprol  Arthropan liquid Dopirydamole Methcarbomol with Aspirin Synalgos  ASA tablets/Enseals Disalcid Micrainin Tagament  Ascriptin Doan's Midol Talwin  Ascriptin A/D Dolene Mobidin Tanderil  Ascriptin Extra Strength Dolobid Moblgesic Ticlid  Ascriptin with Codeine Doloprin or Doloprin with Codeine Momentum  Tolectin  Asperbuf Duoprin Mono-gesic Trendar  Aspergum Duradyne Motrin or Motrin IB Triminicin  Aspirin plain, buffered or enteric coated Durasal Myochrisine Trigesic  Aspirin Suppositories Easprin Nalfon Trillsate  Aspirin with Codeine Ecotrin Regular or Extra Strength Naprosyn Uracel  Atromid-S Efficin Naproxen Ursinus  Auranofin Capsules Elmiron Neocylate Vanquish  Axotal Emagrin Norgesic Verin  Azathioprine Empirin or Empirin with Codeine Normiflo Vitamin E  Azolid Emprazil Nuprin Voltaren  Bayer Aspirin plain, buffered or children's or timed BC Tablets or powders Encaprin Orgaran Warfarin Sodium  Buff-a-Comp Enoxaparin Orudis Zorpin  Buff-a-Comp with Codeine Equegesic Os-Cal-Gesic   Buffaprin Excedrin plain, buffered or Extra Strength Oxalid   Bufferin Arthritis Strength Feldene Oxphenbutazone   Bufferin plain or Extra Strength Feldene Capsules Oxycodone with  Aspirin   Bufferin with Codeine Fenoprofen Fenoprofen Pabalate or Pabalate-SF   Buffets II Flogesic Panagesic   Buffinol plain or Extra Strength Florinal or Florinal with Codeine Panwarfarin   Buf-Tabs Flurbiprofen Penicillamine   Butalbital Compound Four-way cold tablets Penicillin   Butazolidin Fragmin Pepto-Bismol   Carbenicillin Geminisyn Percodan   Carna Arthritis Reliever Geopen Persantine   Carprofen Gold's salt Persistin   Chloramphenicol Goody's Phenylbutazone   Chloromycetin Haltrain Piroxlcam   Clmetidine heparin Plaquenil   Cllnoril Hyco-pap Ponstel   Clofibrate Hydroxy chloroquine Propoxyphen         Before stopping any of these medications, be sure to consult the physician who ordered them.  Some, such as Coumadin (Warfarin) are ordered to prevent or treat serious conditions such as "deep thrombosis", "pumonary embolisms", and other heart problems.  The amount of time that you may need off of the medication may also vary with the medication and the reason for which you were taking it.  If you are taking any  of these medications, please make sure you notify your pain physician before you undergo any procedures.         GENERAL RISKS AND COMPLICATIONS  What are the risk, side effects and possible complications? Generally speaking, most procedures are safe.  However, with any procedure there are risks, side effects, and the possibility of complications.  The risks and complications are dependent upon the sites that are lesioned, or the type of nerve block to be performed.  The closer the procedure is to the spine, the more serious the risks are.  Great care is taken when placing the radio frequency needles, block needles or lesioning probes, but sometimes complications can occur. 2. Infection: Any time there is an injection through the skin, there is a risk of infection.  This is why sterile conditions are used for these blocks.  There are four possible types of infection. 1. Localized skin infection. 2. Central Nervous System Infection-This can be in the form of Meningitis, which can be deadly. 3. Epidural Infections-This can be in the form of an epidural abscess, which can cause pressure inside of the spine, causing compression of the spinal cord with subsequent paralysis. This would require an emergency surgery to decompress, and there are no guarantees that the patient would recover from the paralysis. 4. Discitis-This is an infection of the intervertebral discs.  It occurs in about 1% of discography procedures.  It is difficult to treat and it may lead to surgery.        2. Pain: the needles have to go through skin and soft tissues, will cause soreness.       3. Damage to internal structures:  The nerves to be lesioned may be near blood vessels or    other nerves which can be potentially damaged.       4. Bleeding: Bleeding is more common if the patient is taking blood thinners such as  aspirin, Coumadin, Ticiid, Plavix, etc., or if he/she have some genetic predisposition  such as hemophilia.  Bleeding into the spinal canal can cause compression of the spinal  cord with subsequent paralysis.  This would require an emergency surgery to  decompress and there are no guarantees that the patient would recover from the  paralysis.       5. Pneumothorax:  Puncturing of a lung is a possibility, every time a needle is introduced in  the area of the chest or upper back.  Pneumothorax refers to free air around  the  collapsed lung(s), inside of the thoracic cavity (chest cavity).  Another two possible  complications related to a similar event would include: Hemothorax and Chylothorax.   These are variations of the Pneumothorax, where instead of air around the collapsed  lung(s), you may have blood or chyle, respectively.       6. Spinal headaches: They may occur with any procedures in the area of the spine.       7. Persistent CSF (Cerebro-Spinal Fluid) leakage: This is a rare problem, but may occur  with prolonged intrathecal or epidural catheters either due to the formation of a fistulous  track or a dural tear.       8. Nerve damage: By working so close to the spinal cord, there is always a possibility of  nerve damage, which could be as serious as a permanent spinal cord injury with  paralysis.       9. Death:  Although rare, severe deadly allergic reactions known as "Anaphylactic  reaction" can occur to any of the medications used.      10. Worsening of the symptoms:  We can always make thing worse.  What are the chances of something like this happening? Chances of any of this occuring are extremely low.  By statistics, you have more of a chance of getting killed in a motor vehicle accident: while driving to the hospital than any of the above occurring .  Nevertheless, you should be aware that they are possibilities.  In general, it is similar to taking a shower.  Everybody knows that you can slip, hit your head and get killed.  Does that mean that you should not shower again?  Nevertheless always keep  in mind that statistics do not mean anything if you happen to be on the wrong side of them.  Even if a procedure has a 1 (one) in a 1,000,000 (million) chance of going wrong, it you happen to be that one..Also, keep in mind that by statistics, you have more of a chance of having something go wrong when taking medications.  Who should not have this procedure? If you are on a blood thinning medication (e.g. Coumadin, Plavix, see list of "Blood Thinners"), or if you have an active infection going on, you should not have the procedure.  If you are taking any blood thinners, please inform your physician.  How should I prepare for this procedure?  Do not eat or drink anything at least six hours prior to the procedure.  Bring a driver with you .  It cannot be a taxi.  Come accompanied by an adult that can drive you back, and that is strong enough to help you if your legs get weak or numb from the local anesthetic.  Take all of your medicines the morning of the procedure with just enough water to swallow them.  If you have diabetes, make sure that you are scheduled to have your procedure done first thing in the morning, whenever possible.  If you have diabetes, take only half of your insulin dose and notify our nurse that you have done so as soon as you arrive at the clinic.  If you are diabetic, but only take blood sugar pills (oral hypoglycemic), then do not take them on the morning of your procedure.  You may take them after you have had the procedure.  Do not take aspirin or any aspirin-containing medications, at least eleven (11) days prior to the procedure.  They may prolong  bleeding.  Wear loose fitting clothing that may be easy to take off and that you would not mind if it got stained with Betadine or blood.  Do not wear any jewelry or perfume  Remove any nail coloring.  It will interfere with some of our monitoring equipment.  NOTE: Remember that this is not meant to be interpreted as a  complete list of all possible complications.  Unforeseen problems may occur.  BLOOD THINNERS The following drugs contain aspirin or other products, which can cause increased bleeding during surgery and should not be taken for 2 weeks prior to and 1 week after surgery.  If you should need take something for relief of minor pain, you may take acetaminophen which is found in Tylenol,m Datril, Anacin-3 and Panadol. It is not blood thinner. The products listed below are.  Do not take any of the products listed below in addition to any listed on your instruction sheet.  A.P.C or A.P.C with Codeine Codeine Phosphate Capsules #3 Ibuprofen Ridaura  ABC compound Congesprin Imuran rimadil  Advil Cope Indocin Robaxisal  Alka-Seltzer Effervescent Pain Reliever and Antacid Coricidin or Coricidin-D  Indomethacin Rufen  Alka-Seltzer plus Cold Medicine Cosprin Ketoprofen S-A-C Tablets  Anacin Analgesic Tablets or Capsules Coumadin Korlgesic Salflex  Anacin Extra Strength Analgesic tablets or capsules CP-2 Tablets Lanoril Salicylate  Anaprox Cuprimine Capsules Levenox Salocol  Anexsia-D Dalteparin Magan Salsalate  Anodynos Darvon compound Magnesium Salicylate Sine-off  Ansaid Dasin Capsules Magsal Sodium Salicylate  Anturane Depen Capsules Marnal Soma  APF Arthritis pain formula Dewitt's Pills Measurin Stanback  Argesic Dia-Gesic Meclofenamic Sulfinpyrazone  Arthritis Bayer Timed Release Aspirin Diclofenac Meclomen Sulindac  Arthritis pain formula Anacin Dicumarol Medipren Supac  Analgesic (Safety coated) Arthralgen Diffunasal Mefanamic Suprofen  Arthritis Strength Bufferin Dihydrocodeine Mepro Compound Suprol  Arthropan liquid Dopirydamole Methcarbomol with Aspirin Synalgos  ASA tablets/Enseals Disalcid Micrainin Tagament  Ascriptin Doan's Midol Talwin  Ascriptin A/D Dolene Mobidin Tanderil  Ascriptin Extra Strength Dolobid Moblgesic Ticlid  Ascriptin with Codeine Doloprin or Doloprin with Codeine  Momentum Tolectin  Asperbuf Duoprin Mono-gesic Trendar  Aspergum Duradyne Motrin or Motrin IB Triminicin  Aspirin plain, buffered or enteric coated Durasal Myochrisine Trigesic  Aspirin Suppositories Easprin Nalfon Trillsate  Aspirin with Codeine Ecotrin Regular or Extra Strength Naprosyn Uracel  Atromid-S Efficin Naproxen Ursinus  Auranofin Capsules Elmiron Neocylate Vanquish  Axotal Emagrin Norgesic Verin  Azathioprine Empirin or Empirin with Codeine Normiflo Vitamin E  Azolid Emprazil Nuprin Voltaren  Bayer Aspirin plain, buffered or children's or timed BC Tablets or powders Encaprin Orgaran Warfarin Sodium  Buff-a-Comp Enoxaparin Orudis Zorpin  Buff-a-Comp with Codeine Equegesic Os-Cal-Gesic   Buffaprin Excedrin plain, buffered or Extra Strength Oxalid   Bufferin Arthritis Strength Feldene Oxphenbutazone   Bufferin plain or Extra Strength Feldene Capsules Oxycodone with Aspirin   Bufferin with Codeine Fenoprofen Fenoprofen Pabalate or Pabalate-SF   Buffets II Flogesic Panagesic   Buffinol plain or Extra Strength Florinal or Florinal with Codeine Panwarfarin   Buf-Tabs Flurbiprofen Penicillamine   Butalbital Compound Four-way cold tablets Penicillin   Butazolidin Fragmin Pepto-Bismol   Carbenicillin Geminisyn Percodan   Carna Arthritis Reliever Geopen Persantine   Carprofen Gold's salt Persistin   Chloramphenicol Goody's Phenylbutazone   Chloromycetin Haltrain Piroxlcam   Clmetidine heparin Plaquenil   Cllnoril Hyco-pap Ponstel   Clofibrate Hydroxy chloroquine Propoxyphen         Before stopping any of these medications, be sure to consult the physician who ordered them.  Some, such as Coumadin (Warfarin) are ordered  to prevent or treat serious conditions such as "deep thrombosis", "pumonary embolisms", and other heart problems.  The amount of time that you may need off of the medication may also vary with the medication and the reason for which you were taking it.  If you are  taking any of these medications, please make sure you notify your pain physician before you undergo any procedures.

## 2016-06-29 NOTE — Progress Notes (Signed)
Safety precautions to be maintained throughout the outpatient stay will include: orient to surroundings, keep bed in low position, maintain call bell within reach at all times, provide assistance with transfer out of bed and ambulation.  

## 2016-07-29 ENCOUNTER — Encounter: Payer: Self-pay | Admitting: Pain Medicine

## 2016-07-29 ENCOUNTER — Ambulatory Visit: Payer: Medicare Other | Attending: Pain Medicine | Admitting: Pain Medicine

## 2016-07-29 VITALS — BP 152/65 | HR 72 | Temp 98.1°F | Resp 18 | Ht 62.0 in | Wt 130.0 lb

## 2016-07-29 DIAGNOSIS — E785 Hyperlipidemia, unspecified: Secondary | ICD-10-CM | POA: Diagnosis not present

## 2016-07-29 DIAGNOSIS — E559 Vitamin D deficiency, unspecified: Secondary | ICD-10-CM | POA: Diagnosis not present

## 2016-07-29 DIAGNOSIS — M48061 Spinal stenosis, lumbar region without neurogenic claudication: Secondary | ICD-10-CM | POA: Insufficient documentation

## 2016-07-29 DIAGNOSIS — M25511 Pain in right shoulder: Secondary | ICD-10-CM | POA: Diagnosis present

## 2016-07-29 DIAGNOSIS — G8929 Other chronic pain: Secondary | ICD-10-CM | POA: Insufficient documentation

## 2016-07-29 DIAGNOSIS — Z79891 Long term (current) use of opiate analgesic: Secondary | ICD-10-CM | POA: Diagnosis not present

## 2016-07-29 DIAGNOSIS — M069 Rheumatoid arthritis, unspecified: Secondary | ICD-10-CM | POA: Diagnosis not present

## 2016-07-29 DIAGNOSIS — E119 Type 2 diabetes mellitus without complications: Secondary | ICD-10-CM | POA: Diagnosis not present

## 2016-07-29 DIAGNOSIS — J449 Chronic obstructive pulmonary disease, unspecified: Secondary | ICD-10-CM | POA: Diagnosis not present

## 2016-07-29 DIAGNOSIS — D509 Iron deficiency anemia, unspecified: Secondary | ICD-10-CM | POA: Diagnosis not present

## 2016-07-29 DIAGNOSIS — M25512 Pain in left shoulder: Secondary | ICD-10-CM | POA: Insufficient documentation

## 2016-07-29 DIAGNOSIS — K219 Gastro-esophageal reflux disease without esophagitis: Secondary | ICD-10-CM | POA: Insufficient documentation

## 2016-07-29 DIAGNOSIS — Z7901 Long term (current) use of anticoagulants: Secondary | ICD-10-CM | POA: Insufficient documentation

## 2016-07-29 DIAGNOSIS — Z8673 Personal history of transient ischemic attack (TIA), and cerebral infarction without residual deficits: Secondary | ICD-10-CM | POA: Diagnosis not present

## 2016-07-29 DIAGNOSIS — M545 Low back pain: Secondary | ICD-10-CM

## 2016-07-29 DIAGNOSIS — M4807 Spinal stenosis, lumbosacral region: Secondary | ICD-10-CM

## 2016-07-29 DIAGNOSIS — L089 Local infection of the skin and subcutaneous tissue, unspecified: Secondary | ICD-10-CM | POA: Diagnosis not present

## 2016-07-29 DIAGNOSIS — G894 Chronic pain syndrome: Secondary | ICD-10-CM | POA: Insufficient documentation

## 2016-07-29 DIAGNOSIS — M48062 Spinal stenosis, lumbar region with neurogenic claudication: Secondary | ICD-10-CM

## 2016-07-29 DIAGNOSIS — Z7902 Long term (current) use of antithrombotics/antiplatelets: Secondary | ICD-10-CM | POA: Insufficient documentation

## 2016-07-29 DIAGNOSIS — Z7984 Long term (current) use of oral hypoglycemic drugs: Secondary | ICD-10-CM | POA: Insufficient documentation

## 2016-07-29 DIAGNOSIS — Z7982 Long term (current) use of aspirin: Secondary | ICD-10-CM | POA: Insufficient documentation

## 2016-07-29 DIAGNOSIS — N183 Chronic kidney disease, stage 3 (moderate): Secondary | ICD-10-CM | POA: Diagnosis not present

## 2016-07-29 DIAGNOSIS — Z87828 Personal history of other (healed) physical injury and trauma: Secondary | ICD-10-CM | POA: Diagnosis not present

## 2016-07-29 DIAGNOSIS — M9983 Other biomechanical lesions of lumbar region: Secondary | ICD-10-CM

## 2016-07-29 NOTE — Progress Notes (Signed)
Nursing Pain Medication Assessment:  Safety precautions to be maintained throughout the outpatient stay will include: orient to surroundings, keep bed in low position, maintain call bell within reach at all times, provide assistance with transfer out of bed and ambulation.  Medication Inspection Compliance: Kelli Gutierrez did not comply with our request to bring her pills to be counted. She was reminded that bringing the medication bottles, even when empty, is a requirement. Pill/Patch Count: None available to be counted. Bottle Appearance: No container available. Did not bring bottle(s) to appointment. Medication: None brought in. Filled Date: N/A

## 2016-07-29 NOTE — Progress Notes (Signed)
Patient's Name: Kelli Gutierrez  MRN: [id number]  Referring Provider: [facility]  DOB: 29-Jul-1950  PCP: [facility]  DOS: 07/29/2016  Note by: Kathlen Brunswick. Dossie Arbour, MD  Service setting: Ambulatory outpatient  Specialty: Interventional Pain Management  Location: [facility] (AMB) Pain Management Facility    Patient type: Established   Primary Reason(s) for Visit: Encounter for prescription drug management (Level of risk: moderate) CC: Shoulder Pain (bilateral)  HPI  Kelli Gutierrez is a 66 y.o. year old, female patient, who comes today for a medication management evaluation. 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, L3-4, and L4-5); Chronic pain syndrome; Long term current use of opiate analgesic; Long term prescription opiate use; Opiate use (10 MME/Day); Encounter for pain management planning; Encounter for therapeutic drug level monitoring; Current use of long term anticoagulation (Plavix); Hyperlipidemia; GERD (gastroesophageal reflux disease); History of thalamic infarction (Left); Stage III chronic kidney disease; Vitamin D deficiency; Chronic low back pain; 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 Her primarily concern today is the Shoulder Pain (bilateral)  Pain Assessment: Self-Reported Pain Score: 7 /10 Clinically the patient looks like a 2/10 Reported level is inconsistent with clinical observations. Information on the proper use of the pain scale provided to the patient today Pain  Location: Shoulder Pain Orientation: Left, Right, Anterior, Posterior Pain Descriptors / Indicators: Squeezing, Nagging, Discomfort Pain Frequency: Constant (esp on the right)  Kelli Gutierrez was last scheduled for an appointment on 06/29/2016 for medication management. During today's appointment we reviewed Kelli Gutierrez's chronic pain status, as well as her outpatient medication regimen. Somehow the patient was confused about how to obtain her medications. On her last visit she was given a prescription for the tramadol but she did not take it to the pharmacy to be filled.  The patient  has no drug history on file. Her body mass index is 23.78 kg/m.  Further details on both, my assessment(s), as well as the proposed treatment plan, please see below.  Controlled Substance Pharmacotherapy Assessment REMS (Risk Evaluation and Mitigation Strategy)  Analgesic: Tramadol 50 mg PO BID MME/day: 10 mg/day.  Kelli Holm, RN  07/29/2016  9:07 AM  Sign at close encounter Nursing Pain Medication Assessment:  Safety precautions to be maintained throughout the outpatient stay will include: orient to surroundings, keep bed in low position, maintain call bell within reach at all times, provide assistance with transfer out of bed and ambulation.  Medication Inspection Compliance: Kelli Gutierrez did not comply with our request to bring her pills to be counted. She was reminded that bringing the medication bottles, even when empty, is a requirement. Pill/Patch Count: None available to be counted. Bottle Appearance: No container available. Did not bring bottle(s) to appointment. Medication: None brought in. Filled Date: N/A (Did not understand to take prescription to pharmacy, therefore did not get it filled.) Pharmacokinetics: N/A Pharmacodynamics: N/A Monitoring: [place] PMP: Online review of the past 79-monthperiod conducted. Compliant with practice rules and regulations List of all UDS test(s) done:  Lab Results   Component Value Date   SUMMARY FINAL 03/18/2016   Last  UDS on record: No results found for: TOXASSSELUR UDS interpretation: Compliant          Medication Assessment Form: Reviewed. Patient indicates being compliant with therapy Treatment compliance: Compliant Risk Assessment Profile: Aberrant behavior: See prior evaluations. None observed or detected today Comorbid factors increasing risk of overdose: See prior notes. No additional risks detected today Risk of substance use disorder (SUD): Low Opioid Risk Tool (ORT) Total Score:    Interpretation Table:  Score <3 = Low Risk for SUD  Score between 4-7 = Moderate Risk for SUD  Score >8 = High Risk for Opioid Abuse   Risk Mitigation Strategies:  Patient Counseling: Covered Patient-Prescriber Agreement (PPA): Present and active  Notification to other healthcare providers: Done  Pharmacologic Plan: No change in therapy, at this time  Laboratory Chemistry  Inflammation Markers Lab Results  Component Value Date   CRP <0.8 03/23/2016   ESRSEDRATE 47 (H) 03/23/2016   (CRP: Acute Phase) (ESR: Chronic Phase) Renal Function Markers Lab Results  Component Value Date   BUN 27 (H) 03/23/2016   CREATININE 1.34 (H) 03/23/2016   GFRAA 47 (L) 03/23/2016   GFRNONAA 41 (L) 03/23/2016   Hepatic Function Markers Lab Results  Component Value Date   AST 20 03/23/2016   ALT 16 03/23/2016   ALBUMIN 3.9 03/23/2016   ALKPHOS 64 03/23/2016   Electrolytes Lab Results  Component Value Date   NA 137 03/23/2016   K 4.4 03/23/2016   CL 107 03/23/2016   CALCIUM 9.0 03/23/2016   MG 1.7 03/23/2016   Neuropathy Markers Lab Results  Component Value Date   VITAMINB12 1,816 (H) 03/23/2016   Bone Pathology Markers Lab Results  Component Value Date   ALKPHOS 64 03/23/2016   25OHVITD1 19 (L) 03/23/2016   25OHVITD2 <1.0 03/23/2016   25OHVITD3 19 03/23/2016   CALCIUM 9.0 03/23/2016   Coagulation Parameters Lab Results  Component Value  Date   INR 0.93 12/18/2014   LABPROT 12.7 12/18/2014   APTT 30 12/17/2014   PLT 251 12/18/2014   Cardiovascular Markers Lab Results  Component Value Date   BNP 75 10/10/2012   HGB 13.1 12/18/2014   HCT 39.3 12/18/2014   Note: Lab results reviewed.  Recent Diagnostic Imaging Review  No results found. Note: Imaging results reviewed.          Meds  The patient has a current medication list which includes the following prescription(s): aspirin, atorvastatin, vitamin d3, clopidogrel, hydroxychloroquine, lisinopril, metformin, metoprolol, ranitidine, sitagliptin, tramadol, and vitamin d (ergocalciferol).  Current Outpatient Prescriptions on File Prior to Visit  Medication Sig  . aspirin 81 MG chewable tablet Chew 81 mg by mouth daily.  Marland Kitchen atorvastatin (LIPITOR) 40 MG tablet Take 20 mg by mouth daily.   . Cholecalciferol (VITAMIN D3) 2000 units capsule Take 1 capsule (2,000 Units total) by mouth daily.  . clopidogrel (PLAVIX) 75 MG tablet Take 75 mg by mouth daily.  . hydroxychloroquine (PLAQUENIL) 200 MG tablet Take 200 mg by mouth daily.  Marland Kitchen lisinopril (PRINIVIL,ZESTRIL) 20 MG tablet Take 20 mg by mouth daily.  . metFORMIN (GLUCOPHAGE) 1000 MG tablet Take 1,000 mg by mouth 2 (two) times daily with a meal.  . metoprolol (LOPRESSOR) 50 MG tablet Take 50 mg by mouth 2 (two) times daily.  . ranitidine (ZANTAC) 150 MG capsule Take 150 mg by mouth 2 (two) times daily.  . sitaGLIPtin (JANUVIA) 100 MG tablet Take 100 mg by mouth daily.  . traMADol (ULTRAM) 50 MG tablet Take 1  tablet (50 mg total) by mouth 2 (two) times daily.  . Vitamin D, Ergocalciferol, (DRISDOL) 50000 units CAPS capsule Take 1 capsule (50,000 Units total) by mouth 2 (two) times a week x 6 weeks.   No current facility-administered medications on file prior to visit.    ROS  Constitutional: Denies any fever or chills Gastrointestinal: No reported hemesis, hematochezia, vomiting, or acute GI distress Musculoskeletal:  Denies any acute onset joint swelling, redness, loss of ROM, or weakness Neurological: No reported episodes of acute onset apraxia, aphasia, dysarthria, agnosia, amnesia, paralysis, loss of coordination, or loss of consciousness  Allergies  Kelli Gutierrez has No Known Allergies.  PFSH  Drug: Kelli Gutierrez  has no drug history on file. Alcohol:  reports that she does not drink alcohol. Tobacco:  reports that she has never smoked. She has never used smokeless tobacco. Medical:  has a past medical history of Acute CVA (cerebrovascular accident) ([place]) (12/17/2014); Anemia; Breast pain, right (06/01/2015); Chronic kidney disease (12/2015); CVA (cerebral infarction) (12/17/2014); Diabetes mellitus without complication ([place]); Hypertension; Lumbar radiculitis (12/06/2013); Rheumatoid arthritis ([place]); and Stroke ([place]). Family: family history is not on file.  Past Surgical History:  Procedure Laterality Date  . APPENDECTOMY    . CESAREAN SECTION     4  . CHOLECYSTECTOMY    . MIDDLE EAR SURGERY     Constitutional Exam  General appearance: Well nourished, well developed, and well hydrated. In no apparent acute distress Vitals:   07/29/16 0849  BP: (!) 152/65  Pulse: 72  Resp: 18  Temp: 98.1 F (36.7 C)  TempSrc: Oral  SpO2: 100%  Weight: 130 lb (59 kg)  Height: _0  (1.575 m)   BMI Assessment: Estimated body mass index is 23.78 kg/m as calculated from the following:   Height as of this encounter: _1  (1.575 m).   Weight as of this encounter: 130 lb (59 kg).  BMI interpretation table: BMI level Category Range association with higher incidence of chronic pain  <18 kg/m2 Underweight   18.5-24.9 kg/m2 Ideal body weight   25-29.9 kg/m2 Overweight Increased incidence by 20%  30-34.9 kg/m2 Obese (Class I) Increased incidence by 68%  35-39.9 kg/m2 Severe obesity (Class II) Increased incidence by 136%  >40 kg/m2 Extreme obesity (Class III) Increased incidence by 254%   BMI Readings from Last 4  Encounters:  07/29/16 23.78 kg/m  06/29/16 23.23 kg/m  03/18/16 22.50 kg/m  12/17/14 27.99 kg/m   Wt Readings from Last 4 Encounters:  07/29/16 130 lb (59 kg)  06/29/16 127 lb (57.6 kg)  03/18/16 123 lb (55.8 kg)  12/17/14 158 lb (71.7 kg)  Psych/Mental status: Alert, oriented x 3 (person, place, & time)       Eyes: PERLA Respiratory: No evidence of acute respiratory distress  Cervical Spine Exam  Inspection: No masses, redness, or swelling Alignment: Symmetrical Functional ROM: Decreased ROM to the right Stability: No instability detected Muscle strength & Tone: Functionally intact Sensory: Unimpaired Palpation: No palpable anomalies              Upper Extremity (UE) Exam    Side: Right upper extremity  Side: Left upper extremity  Inspection: No masses, redness, swelling, or asymmetry. No contractures  Inspection: No masses, redness, swelling, or asymmetry. No contractures  Functional ROM: Unrestricted ROM          Functional ROM: Unrestricted ROM          Muscle strength & Tone: Functionally intact  Muscle strength & Tone: Functionally  intact  Sensory: Unimpaired  Sensory: Unimpaired  Palpation: No palpable anomalies              Palpation: No palpable anomalies              Specialized Test(s): Deferred         Specialized Test(s): Deferred          Thoracic Spine Exam  Inspection: No masses, redness, or swelling Alignment: Symmetrical Functional ROM: Unrestricted ROM Stability: No instability detected Sensory: Unimpaired Muscle strength & Tone: No palpable anomalies  Lumbar Spine Exam  Inspection: No masses, redness, or swelling Alignment: Symmetrical Functional ROM: Unrestricted ROM Stability: No instability detected Muscle strength & Tone: Functionally intact Sensory: Unimpaired Palpation: No palpable anomalies Provocative Tests: Lumbar Hyperextension and rotation test: evaluation deferred today       Patrick's Maneuver: evaluation deferred today               Gait & Posture Assessment  Ambulation: Unassisted Gait: Relatively normal for age and body habitus Posture: WNL   Lower Extremity Exam    Side: Right lower extremity  Side: Left lower extremity  Inspection: No masses, redness, swelling, or asymmetry. No contractures  Inspection: No masses, redness, swelling, or asymmetry. No contractures  Functional ROM: Unrestricted ROM          Functional ROM: Unrestricted ROM          Muscle strength & Tone: Functionally intact  Muscle strength & Tone: Functionally intact  Sensory: Unimpaired  Sensory: Unimpaired  Palpation: No palpable anomalies  Palpation: No palpable anomalies   Assessment  Primary Diagnosis & Pertinent Problem List: The primary encounter diagnosis was Chronic shoulder pain (Location of Primary Source of Pain) (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). 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  Status Diagnosis  Controlled Controlled Controlled 1. Chronic shoulder pain (Location of Primary Source of Pain) (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)   2. Chronic pain syndrome   3. Rheumatoid arthritis   4. Chronic low back pain   5. Lumbar central spinal stenosis   6. 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 Lumbar foraminal stenosis (Bilateral L4-5 and L5-S1)   8. History of whiplash injury to neck   9. Current use of long term anticoagulation (Plavix)      Plan of Care  Pharmacotherapy (Medications Ordered): No orders of the defined types were placed in this encounter.  New Prescriptions   No medications on file   Medications administered today: Kelli Gutierrez had no medications administered during this visit. Lab-work, procedure(s),  and/or referral(s): Orders Placed This Encounter  Procedures  . SUPRASCAPULAR NERVE BLOCK  . Blood Thinner Instructions to Nursing   Imaging and/or referral(s): BLOOD THINNER INSTRUCTIONS TO NURSING  Interventional therapies: Planned, scheduled, and/or pending:   Diagnostic suprascapular nerve block without steroids (Stop plavix x 7-10 days prior to procedure)   Considering:   Diagnostic suprascapular nerve block without steroids  Possible bilateral suprascapular nerve radiofrequency ablation  Diagnostic left-sided cervical epidural steroid injection under fluoroscopic guidance and IV sedation  Diagnostic left cervical facet block under fluoroscopic guidance and IV sedation    Palliative PRN treatment(s):   To be determined at a later time   Provider-requested follow-up: Return for (  stop blood thinner before procedure), procedure (ASAP).  No future appointments. Primary Care Physician: [facility] Location: [facility] Outpatient Pain Management Facility Note by: Kathlen Brunswick. Dossie Arbour, M.D, DABA, DABAPM, DABPM, DABIPP, FIPP Date: 07/29/2016; Time: 5:27 PM  Patient instructions provided during this appointment: Patient Instructions   Preparing for your procedure (without sedation) Instructions: . Oral Intake: Do not eat or drink anything for at least 3 hours prior to your procedure. . Transportation: Unless otherwise stated by your physician, you may drive yourself after the procedure. . Blood Pressure Medicine: Take your blood pressure medicine with a sip of water the morning of the procedure. . Blood thinners:  . Diabetics on insulin: Notify the staff so that you can be scheduled 1st case in the morning. If your diabetes requires high dose insulin, take only  of your normal insulin dose the morning of the procedure and notify the staff that you have done so. . Preventing infections: Shower with an antibacterial soap the morning of your procedure.  . Build-up  your immune system: Take 1000 mg of Vitamin C with every meal (3 times a day) the day prior to your procedure. Marland Kitchen Antibiotics: Inform the staff if you have a condition or reason that requires you to take antibiotics before dental procedures. . Pregnancy: If you are pregnant, call and cancel the procedure. . Sickness: If you have a cold, fever, or any active infections, call and cancel the procedure. . Arrival: You must be in the facility at least 30 minutes prior to your scheduled procedure. . Children: Do not bring any children with you. . Dress appropriately: Bring dark clothing that you would not mind if they get stained. . Valuables: Do not bring any jewelry or valuables. Procedure appointments are reserved for interventional treatments only. Marland Kitchen No Prescription Refills. . No medication changes will be discussed during procedure appointments. . No disability issues will be discussed.  ____________________________________________________________________________________________  Pain Score  Introduction: The pain score used by this practice is the Verbal Numerical Rating Scale (VNRS-11). This is an 11-point scale. It is for adults and children 10 years or older. There are significant differences in how the pain score is reported, used, and applied. Forget everything you learned in the past and learn this scoring system.  General Information: The scale should reflect your current level of pain. Unless you are specifically asked for the level of your worst pain, or your average pain. If you are asked for one of these two, then it should be understood that it is over the past 24 hours.  Basic Activities of Daily Living (ADL): Personal hygiene, dressing, eating, transferring, and using restroom.  Instructions: Most patients tend to report their level of pain as a combination of two factors, their physical pain and their psychosocial pain. This last one is also known as "suffering" and it is  reflection of how physical pain affects you socially and psychologically. From now on, report them separately. From this point on, when asked to report your pain level, report only your physical pain. Use the following table for reference.  Pain Clinic Pain Levels (0-5/10)  Pain Level Score Description  No Pain 0   Mild pain 1 Nagging, annoying, but does not interfere with basic activities of daily living (ADL). Patients are able to eat, bathe, get dressed, toileting (being able to get on and off the toilet and perform personal hygiene functions), transfer (move in and out of bed or a chair without assistance), and maintain continence (  able to control bladder and bowel functions). Blood pressure and heart rate are unaffected. A normal heart rate for a healthy adult ranges from 60 to 100 bpm (beats per minute).   Mild to moderate pain 2 Noticeable and distracting. Impossible to hide from other people. More frequent flare-ups. Still possible to adapt and function close to normal. It can be very annoying and may have occasional stronger flare-ups. With discipline, patients may get used to it and adapt.   Moderate pain 3 Interferes significantly with activities of daily living (ADL). It becomes difficult to feed, bathe, get dressed, get on and off the toilet or to perform personal hygiene functions. Difficult to get in and out of bed or a chair without assistance. Very distracting. With effort, it can be ignored when deeply involved in activities.   Moderately severe pain 4 Impossible to ignore for more than a few minutes. With effort, patients may still be able to manage work or participate in some social activities. Very difficult to concentrate. Signs of autonomic nervous system discharge are evident: dilated pupils (mydriasis); mild sweating (diaphoresis); sleep interference. Heart rate becomes elevated (>115 bpm). Diastolic blood pressure (lower number) rises above 100 mmHg. Patients find relief in  laying down and not moving.   Severe pain 5 Intense and extremely unpleasant. Associated with frowning face and frequent crying. Pain overwhelms the senses.  Ability to do any activity or maintain social relationships becomes significantly limited. Conversation becomes difficult. Pacing back and forth is common, as getting into a comfortable position is nearly impossible. Pain wakes you up from deep sleep. Physical signs will be obvious: pupillary dilation; increased sweating; goosebumps; brisk reflexes; cold, clammy hands and feet; nausea, vomiting or dry heaves; loss of appetite; significant sleep disturbance with inability to fall asleep or to remain asleep. When persistent, significant weight loss is observed due to the complete loss of appetite and sleep deprivation.  Blood pressure and heart rate becomes significantly elevated. Caution: If elevated blood pressure triggers a pounding headache associated with blurred vision, then the patient should immediately seek attention at an urgent or emergency care unit, as these may be signs of an impending stroke.    Emergency Department Pain Levels (6-10/10)  Emergency Room Pain 6 Severely limiting. Requires emergency care and should not be seen or managed at an outpatient pain management facility. Communication becomes difficult and requires great effort. Assistance to reach the emergency department may be required. Facial flushing and profuse sweating along with potentially dangerous increases in heart rate and blood pressure will be evident.   Distressing pain 7 Self-care is very difficult. Assistance is required to transport, or use restroom. Assistance to reach the emergency department will be required. Tasks requiring coordination, such as bathing and getting dressed become very difficult.   Disabling pain 8 Self-care is no longer possible. At this level, pain is disabling. The individual is unable to do even the most "basic" activities such as  walking, eating, bathing, dressing, transferring to a bed, or toileting. Fine motor skills are lost. It is difficult to think clearly.   Incapacitating pain 9 Pain becomes incapacitating. Thought processing is no longer possible. Difficult to remember your own name. Control of movement and coordination are lost.   The worst pain imaginable 10 At this level, most patients pass out from pain. When this level is reached, collapse of the autonomic nervous system occurs, leading to a sudden drop in blood pressure and heart rate. This in turn results in a temporary  and dramatic drop in blood flow to the brain, leading to a loss of consciousness. Fainting is one of the body's self defense mechanisms. Passing out puts the brain in a calmed state and causes it to shut down for a while, in order to begin the healing process.    Summary: 1. Refer to this scale when providing [place] with your pain level. 2. Be accurate and careful when reporting your pain level. This will help with your care. 3. Over-reporting your pain level will lead to loss of credibility. 4. Even a level of 1/10 means that there is pain and will be treated at our facility. 5. High, inaccurate reporting will be documented as "Symptom Exaggeration", leading to loss of credibility and suspicions of possible secondary gains such as obtaining more narcotics, or wanting to appear disabled, for fraudulent reasons. 6. Only pain levels of 5 or below will be seen at our facility. 7. Pain levels of 6 and above will be sent to the Emergency Department and the appointment cancelled. _____________________________________________________________________________________________  GENERAL RISKS AND COMPLICATIONS  What are the risk, side effects and possible complications? Generally speaking, most procedures are safe.  However, with any procedure there are risks, side effects, and the possibility of complications.  The risks and complications are dependent  upon the sites that are lesioned, or the type of nerve block to be performed.  The closer the procedure is to the spine, the more serious the risks are.  Great care is taken when placing the radio frequency needles, block needles or lesioning probes, but sometimes complications can occur. 1. Infection: Any time there is an injection through the skin, there is a risk of infection.  This is why sterile conditions are used for these blocks.  There are four possible types of infection. 1. Localized skin infection. 2. Central Nervous System Infection-This can be in the form of Meningitis, which can be deadly. 3. Epidural Infections-This can be in the form of an epidural abscess, which can cause pressure inside of the spine, causing compression of the spinal cord with subsequent paralysis. This would require an emergency surgery to decompress, and there are no guarantees that the patient would recover from the paralysis. 4. Discitis-This is an infection of the intervertebral discs.  It occurs in about 1% of discography procedures.  It is difficult to treat and it may lead to surgery.        2. Pain: the needles have to go through skin and soft tissues, will cause soreness.       3. Damage to internal structures:  The nerves to be lesioned may be near blood vessels or    other nerves which can be potentially damaged.       4. Bleeding: Bleeding is more common if the patient is taking blood thinners such as  aspirin, Coumadin, Ticiid, Plavix, etc., or if he/she have some genetic predisposition  such as hemophilia. Bleeding into the spinal canal can cause compression of the spinal  cord with subsequent paralysis.  This would require an emergency surgery to  decompress and there are no guarantees that the patient would recover from the  paralysis.       5. Pneumothorax:  Puncturing of a lung is a possibility, every time a needle is introduced in  the area of the chest or upper back.  Pneumothorax refers to free air  around the  collapsed lung(s), inside of the thoracic cavity (chest cavity).  Another two possible  complications related to  a similar event would include: Hemothorax and Chylothorax.   These are variations of the Pneumothorax, where instead of air around the collapsed  lung(s), you may have blood or chyle, respectively.       6. Spinal headaches: They may occur with any procedures in the area of the spine.       7. Persistent CSF (Cerebro-Spinal Fluid) leakage: This is a rare problem, but may occur  with prolonged intrathecal or epidural catheters either due to the formation of a fistulous  track or a dural tear.       8. Nerve damage: By working so close to the spinal cord, there is always a possibility of  nerve damage, which could be as serious as a permanent spinal cord injury with  paralysis.       9. Death:  Although rare, severe deadly allergic reactions known as "Anaphylactic  reaction" can occur to any of the medications used.      10. Worsening of the symptoms:  We can always make thing worse.  What are the chances of something like this happening? Chances of any of this occuring are extremely low.  By statistics, you have more of a chance of getting killed in a motor vehicle accident: while driving to the hospital than any of the above occurring .  Nevertheless, you should be aware that they are possibilities.  In general, it is similar to taking a shower.  Everybody knows that you can slip, hit your head and get killed.  Does that mean that you should not shower again?  Nevertheless always keep in mind that statistics do not mean anything if you happen to be on the wrong side of them.  Even if a procedure has a 1 (one) in a 1,000,000 (million) chance of going wrong, it you happen to be that one..Also, keep in mind that by statistics, you have more of a chance of having something go wrong when taking medications.  Who should not have this procedure? If you are on a blood thinning medication  (e.g. Coumadin, Plavix, see list of "Blood Thinners"), or if you have an active infection going on, you should not have the procedure.  If you are taking any blood thinners, please inform your physician.  How should I prepare for this procedure?  Do not eat or drink anything at least six hours prior to the procedure.  Bring a driver with you .  It cannot be a taxi.  Come accompanied by an adult that can drive you back, and that is strong enough to help you if your legs get weak or numb from the local anesthetic.  Take all of your medicines the morning of the procedure with just enough water to swallow them.  If you have diabetes, make sure that you are scheduled to have your procedure done first thing in the morning, whenever possible.  If you have diabetes, take only half of your insulin dose and notify our nurse that you have done so as soon as you arrive at the clinic.  If you are diabetic, but only take blood sugar pills (oral hypoglycemic), then do not take them on the morning of your procedure.  You may take them after you have had the procedure.  Do not take aspirin or any aspirin-containing medications, at least eleven (11) days prior to the procedure.  They may prolong bleeding.  Wear loose fitting clothing that may be easy to take off and that you would not mind  if it got stained with Betadine or blood.  Do not wear any jewelry or perfume  Remove any nail coloring.  It will interfere with some of our monitoring equipment.  NOTE: Remember that this is not meant to be interpreted as a complete list of all possible complications.  Unforeseen problems may occur.  BLOOD THINNERS The following drugs contain aspirin or other products, which can cause increased bleeding during surgery and should not be taken for 2 weeks prior to and 1 week after surgery.  If you should need take something for relief of minor pain, you may take acetaminophen which is found in Tylenol,m Datril, Anacin-3  and Panadol. It is not blood thinner. The products listed below are.  Do not take any of the products listed below in addition to any listed on your instruction sheet.  A.P.C or A.P.C with Codeine Codeine Phosphate Capsules #3 Ibuprofen Ridaura  ABC compound Congesprin Imuran rimadil  Advil Cope Indocin Robaxisal  Alka-Seltzer Effervescent Pain Reliever and Antacid Coricidin or Coricidin-D  Indomethacin Rufen  Alka-Seltzer plus Cold Medicine Cosprin Ketoprofen S-A-C Tablets  Anacin Analgesic Tablets or Capsules Coumadin Korlgesic Salflex  Anacin Extra Strength Analgesic tablets or capsules CP-2 Tablets Lanoril Salicylate  Anaprox Cuprimine Capsules Levenox Salocol  Anexsia-D Dalteparin Magan Salsalate  Anodynos Darvon compound Magnesium Salicylate Sine-off  Ansaid Dasin Capsules Magsal Sodium Salicylate  Anturane Depen Capsules Marnal Soma  APF Arthritis pain formula Dewitt's Pills Measurin Stanback  Argesic Dia-Gesic Meclofenamic Sulfinpyrazone  Arthritis Bayer Timed Release Aspirin Diclofenac Meclomen Sulindac  Arthritis pain formula Anacin Dicumarol Medipren Supac  Analgesic (Safety coated) Arthralgen Diffunasal Mefanamic Suprofen  Arthritis Strength Bufferin Dihydrocodeine Mepro Compound Suprol  Arthropan liquid Dopirydamole Methcarbomol with Aspirin Synalgos  ASA tablets/Enseals Disalcid Micrainin Tagament  Ascriptin Doan's Midol Talwin  Ascriptin A/D Dolene Mobidin Tanderil  Ascriptin Extra Strength Dolobid Moblgesic Ticlid  Ascriptin with Codeine Doloprin or Doloprin with Codeine Momentum Tolectin  Asperbuf Duoprin Mono-gesic Trendar  Aspergum Duradyne Motrin or Motrin IB Triminicin  Aspirin plain, buffered or enteric coated Durasal Myochrisine Trigesic  Aspirin Suppositories Easprin Nalfon Trillsate  Aspirin with Codeine Ecotrin Regular or Extra Strength Naprosyn Uracel  Atromid-S Efficin Naproxen Ursinus  Auranofin Capsules Elmiron Neocylate Vanquish  Axotal Emagrin  Norgesic Verin  Azathioprine Empirin or Empirin with Codeine Normiflo Vitamin E  Azolid Emprazil Nuprin Voltaren  Bayer Aspirin plain, buffered or children's or timed BC Tablets or powders Encaprin Orgaran Warfarin Sodium  Buff-a-Comp Enoxaparin Orudis Zorpin  Buff-a-Comp with Codeine Equegesic Os-Cal-Gesic   Buffaprin Excedrin plain, buffered or Extra Strength Oxalid   Bufferin Arthritis Strength Feldene Oxphenbutazone   Bufferin plain or Extra Strength Feldene Capsules Oxycodone with Aspirin   Bufferin with Codeine Fenoprofen Fenoprofen Pabalate or Pabalate-SF   Buffets II Flogesic Panagesic   Buffinol plain or Extra Strength Florinal or Florinal with Codeine Panwarfarin   Buf-Tabs Flurbiprofen Penicillamine   Butalbital Compound Four-way cold tablets Penicillin   Butazolidin Fragmin Pepto-Bismol   Carbenicillin Geminisyn Percodan   Carna Arthritis Reliever Geopen Persantine   Carprofen Gold's salt Persistin   Chloramphenicol Goody's Phenylbutazone   Chloromycetin Haltrain Piroxlcam   Clmetidine heparin Plaquenil   Cllnoril Hyco-pap Ponstel   Clofibrate Hydroxy chloroquine Propoxyphen         Before stopping any of these medications, be sure to consult the physician who ordered them.  Some, such as Coumadin (Warfarin) are ordered to prevent or treat serious conditions such as "deep thrombosis", "pumonary embolisms", and other heart problems.  The amount  of time that you may need off of the medication may also vary with the medication and the reason for which you were taking it.  If you are taking any of these medications, please make sure you notify your pain physician before you undergo any procedures.

## 2016-07-29 NOTE — Patient Instructions (Addendum)
Preparing for your procedure (without sedation) Instructions: . Oral Intake: Do not eat or drink anything for at least 3 hours prior to your procedure. . Transportation: Unless otherwise stated by your physician, you may drive yourself after the procedure. . Blood Pressure Medicine: Take your blood pressure medicine with a sip of water the morning of the procedure. . Blood thinners:  . Diabetics on insulin: Notify the staff so that you can be scheduled 1st case in the morning. If your diabetes requires high dose insulin, take only  of your normal insulin dose the morning of the procedure and notify the staff that you have done so. . Preventing infections: Shower with an antibacterial soap the morning of your procedure.  . Build-up your immune system: Take 1000 mg of Vitamin C with every meal (3 times a day) the day prior to your procedure. Marland Kitchen Antibiotics: Inform the staff if you have a condition or reason that requires you to take antibiotics before dental procedures. . Pregnancy: If you are pregnant, call and cancel the procedure. . Sickness: If you have a cold, fever, or any active infections, call and cancel the procedure. . Arrival: You must be in the facility at least 30 minutes prior to your scheduled procedure. . Children: Do not bring any children with you. . Dress appropriately: Bring dark clothing that you would not mind if they get stained. . Valuables: Do not bring any jewelry or valuables. Procedure appointments are reserved for interventional treatments only. Marland Kitchen No Prescription Refills. . No medication changes will be discussed during procedure appointments. . No disability issues will be discussed.  ____________________________________________________________________________________________  Pain Score  Introduction: The pain score used by this practice is the Verbal Numerical Rating Scale (VNRS-11). This is an 11-point scale. It is for adults and children 10 years or older.  There are significant differences in how the pain score is reported, used, and applied. Forget everything you learned in the past and learn this scoring system.  General Information: The scale should reflect your current level of pain. Unless you are specifically asked for the level of your worst pain, or your average pain. If you are asked for one of these two, then it should be understood that it is over the past 24 hours.  Basic Activities of Daily Living (ADL): Personal hygiene, dressing, eating, transferring, and using restroom.  Instructions: Most patients tend to report their level of pain as a combination of two factors, their physical pain and their psychosocial pain. This last one is also known as "suffering" and it is reflection of how physical pain affects you socially and psychologically. From now on, report them separately. From this point on, when asked to report your pain level, report only your physical pain. Use the following table for reference.  Pain Clinic Pain Levels (0-5/10)  Pain Level Score Description  No Pain 0   Mild pain 1 Nagging, annoying, but does not interfere with basic activities of daily living (ADL). Patients are able to eat, bathe, get dressed, toileting (being able to get on and off the toilet and perform personal hygiene functions), transfer (move in and out of bed or a chair without assistance), and maintain continence (able to control bladder and bowel functions). Blood pressure and heart rate are unaffected. A normal heart rate for a healthy adult ranges from 60 to 100 bpm (beats per minute).   Mild to moderate pain 2 Noticeable and distracting. Impossible to hide from other people. More frequent flare-ups. Still possible  to adapt and function close to normal. It can be very annoying and may have occasional stronger flare-ups. With discipline, patients may get used to it and adapt.   Moderate pain 3 Interferes significantly with activities of daily living  (ADL). It becomes difficult to feed, bathe, get dressed, get on and off the toilet or to perform personal hygiene functions. Difficult to get in and out of bed or a chair without assistance. Very distracting. With effort, it can be ignored when deeply involved in activities.   Moderately severe pain 4 Impossible to ignore for more than a few minutes. With effort, patients may still be able to manage work or participate in some social activities. Very difficult to concentrate. Signs of autonomic nervous system discharge are evident: dilated pupils (mydriasis); mild sweating (diaphoresis); sleep interference. Heart rate becomes elevated (>115 bpm). Diastolic blood pressure (lower number) rises above 100 mmHg. Patients find relief in laying down and not moving.   Severe pain 5 Intense and extremely unpleasant. Associated with frowning face and frequent crying. Pain overwhelms the senses.  Ability to do any activity or maintain social relationships becomes significantly limited. Conversation becomes difficult. Pacing back and forth is common, as getting into a comfortable position is nearly impossible. Pain wakes you up from deep sleep. Physical signs will be obvious: pupillary dilation; increased sweating; goosebumps; brisk reflexes; cold, clammy hands and feet; nausea, vomiting or dry heaves; loss of appetite; significant sleep disturbance with inability to fall asleep or to remain asleep. When persistent, significant weight loss is observed due to the complete loss of appetite and sleep deprivation.  Blood pressure and heart rate becomes significantly elevated. Caution: If elevated blood pressure triggers a pounding headache associated with blurred vision, then the patient should immediately seek attention at an urgent or emergency care unit, as these may be signs of an impending stroke.    Emergency Department Pain Levels (6-10/10)  Emergency Room Pain 6 Severely limiting. Requires emergency care and  should not be seen or managed at an outpatient pain management facility. Communication becomes difficult and requires great effort. Assistance to reach the emergency department may be required. Facial flushing and profuse sweating along with potentially dangerous increases in heart rate and blood pressure will be evident.   Distressing pain 7 Self-care is very difficult. Assistance is required to transport, or use restroom. Assistance to reach the emergency department will be required. Tasks requiring coordination, such as bathing and getting dressed become very difficult.   Disabling pain 8 Self-care is no longer possible. At this level, pain is disabling. The individual is unable to do even the most "basic" activities such as walking, eating, bathing, dressing, transferring to a bed, or toileting. Fine motor skills are lost. It is difficult to think clearly.   Incapacitating pain 9 Pain becomes incapacitating. Thought processing is no longer possible. Difficult to remember your own name. Control of movement and coordination are lost.   The worst pain imaginable 10 At this level, most patients pass out from pain. When this level is reached, collapse of the autonomic nervous system occurs, leading to a sudden drop in blood pressure and heart rate. This in turn results in a temporary and dramatic drop in blood flow to the brain, leading to a loss of consciousness. Fainting is one of the body's self defense mechanisms. Passing out puts the brain in a calmed state and causes it to shut down for a while, in order to begin the healing process.  Summary: 1. Refer to this scale when providing [place] with your pain level. 2. Be accurate and careful when reporting your pain level. This will help with your care. 3. Over-reporting your pain level will lead to loss of credibility. 4. Even a level of 1/10 means that there is pain and will be treated at our facility. 5. High, inaccurate reporting will be documented  as "Symptom Exaggeration", leading to loss of credibility and suspicions of possible secondary gains such as obtaining more narcotics, or wanting to appear disabled, for fraudulent reasons. 6. Only pain levels of 5 or below will be seen at our facility. 7. Pain levels of 6 and above will be sent to the Emergency Department and the appointment cancelled. _____________________________________________________________________________________________  GENERAL RISKS AND COMPLICATIONS  What are the risk, side effects and possible complications? Generally speaking, most procedures are safe.  However, with any procedure there are risks, side effects, and the possibility of complications.  The risks and complications are dependent upon the sites that are lesioned, or the type of nerve block to be performed.  The closer the procedure is to the spine, the more serious the risks are.  Great care is taken when placing the radio frequency needles, block needles or lesioning probes, but sometimes complications can occur. 1. Infection: Any time there is an injection through the skin, there is a risk of infection.  This is why sterile conditions are used for these blocks.  There are four possible types of infection. 1. Localized skin infection. 2. Central Nervous System Infection-This can be in the form of Meningitis, which can be deadly. 3. Epidural Infections-This can be in the form of an epidural abscess, which can cause pressure inside of the spine, causing compression of the spinal cord with subsequent paralysis. This would require an emergency surgery to decompress, and there are no guarantees that the patient would recover from the paralysis. 4. Discitis-This is an infection of the intervertebral discs.  It occurs in about 1% of discography procedures.  It is difficult to treat and it may lead to surgery.        2. Pain: the needles have to go through skin and soft tissues, will cause soreness.       3. Damage  to internal structures:  The nerves to be lesioned may be near blood vessels or    other nerves which can be potentially damaged.       4. Bleeding: Bleeding is more common if the patient is taking blood thinners such as  aspirin, Coumadin, Ticiid, Plavix, etc., or if he/she have some genetic predisposition  such as hemophilia. Bleeding into the spinal canal can cause compression of the spinal  cord with subsequent paralysis.  This would require an emergency surgery to  decompress and there are no guarantees that the patient would recover from the  paralysis.       5. Pneumothorax:  Puncturing of a lung is a possibility, every time a needle is introduced in  the area of the chest or upper back.  Pneumothorax refers to free air around the  collapsed lung(s), inside of the thoracic cavity (chest cavity).  Another two possible  complications related to a similar event would include: Hemothorax and Chylothorax.   These are variations of the Pneumothorax, where instead of air around the collapsed  lung(s), you may have blood or chyle, respectively.       6. Spinal headaches: They may occur with any procedures in the area of the  spine.       7. Persistent CSF (Cerebro-Spinal Fluid) leakage: This is a rare problem, but may occur  with prolonged intrathecal or epidural catheters either due to the formation of a fistulous  track or a dural tear.       8. Nerve damage: By working so close to the spinal cord, there is always a possibility of  nerve damage, which could be as serious as a permanent spinal cord injury with  paralysis.       9. Death:  Although rare, severe deadly allergic reactions known as "Anaphylactic  reaction" can occur to any of the medications used.      10. Worsening of the symptoms:  We can always make thing worse.  What are the chances of something like this happening? Chances of any of this occuring are extremely low.  By statistics, you have more of a chance of getting killed in a motor  vehicle accident: while driving to the hospital than any of the above occurring .  Nevertheless, you should be aware that they are possibilities.  In general, it is similar to taking a shower.  Everybody knows that you can slip, hit your head and get killed.  Does that mean that you should not shower again?  Nevertheless always keep in mind that statistics do not mean anything if you happen to be on the wrong side of them.  Even if a procedure has a 1 (one) in a 1,000,000 (million) chance of going wrong, it you happen to be that one..Also, keep in mind that by statistics, you have more of a chance of having something go wrong when taking medications.  Who should not have this procedure? If you are on a blood thinning medication (e.g. Coumadin, Plavix, see list of "Blood Thinners"), or if you have an active infection going on, you should not have the procedure.  If you are taking any blood thinners, please inform your physician.  How should I prepare for this procedure?  Do not eat or drink anything at least six hours prior to the procedure.  Bring a driver with you .  It cannot be a taxi.  Come accompanied by an adult that can drive you back, and that is strong enough to help you if your legs get weak or numb from the local anesthetic.  Take all of your medicines the morning of the procedure with just enough water to swallow them.  If you have diabetes, make sure that you are scheduled to have your procedure done first thing in the morning, whenever possible.  If you have diabetes, take only half of your insulin dose and notify our nurse that you have done so as soon as you arrive at the clinic.  If you are diabetic, but only take blood sugar pills (oral hypoglycemic), then do not take them on the morning of your procedure.  You may take them after you have had the procedure.  Do not take aspirin or any aspirin-containing medications, at least eleven (11) days prior to the procedure.  They may  prolong bleeding.  Wear loose fitting clothing that may be easy to take off and that you would not mind if it got stained with Betadine or blood.  Do not wear any jewelry or perfume  Remove any nail coloring.  It will interfere with some of our monitoring equipment.  NOTE: Remember that this is not meant to be interpreted as a complete list of all possible complications.  Unforeseen problems may occur.  BLOOD THINNERS The following drugs contain aspirin or other products, which can cause increased bleeding during surgery and should not be taken for 2 weeks prior to and 1 week after surgery.  If you should need take something for relief of minor pain, you may take acetaminophen which is found in Tylenol,m Datril, Anacin-3 and Panadol. It is not blood thinner. The products listed below are.  Do not take any of the products listed below in addition to any listed on your instruction sheet.  A.P.C or A.P.C with Codeine Codeine Phosphate Capsules #3 Ibuprofen Ridaura  ABC compound Congesprin Imuran rimadil  Advil Cope Indocin Robaxisal  Alka-Seltzer Effervescent Pain Reliever and Antacid Coricidin or Coricidin-D  Indomethacin Rufen  Alka-Seltzer plus Cold Medicine Cosprin Ketoprofen S-A-C Tablets  Anacin Analgesic Tablets or Capsules Coumadin Korlgesic Salflex  Anacin Extra Strength Analgesic tablets or capsules CP-2 Tablets Lanoril Salicylate  Anaprox Cuprimine Capsules Levenox Salocol  Anexsia-D Dalteparin Magan Salsalate  Anodynos Darvon compound Magnesium Salicylate Sine-off  Ansaid Dasin Capsules Magsal Sodium Salicylate  Anturane Depen Capsules Marnal Soma  APF Arthritis pain formula Dewitt's Pills Measurin Stanback  Argesic Dia-Gesic Meclofenamic Sulfinpyrazone  Arthritis Bayer Timed Release Aspirin Diclofenac Meclomen Sulindac  Arthritis pain formula Anacin Dicumarol Medipren Supac  Analgesic (Safety coated) Arthralgen Diffunasal Mefanamic Suprofen  Arthritis Strength Bufferin  Dihydrocodeine Mepro Compound Suprol  Arthropan liquid Dopirydamole Methcarbomol with Aspirin Synalgos  ASA tablets/Enseals Disalcid Micrainin Tagament  Ascriptin Doan's Midol Talwin  Ascriptin A/D Dolene Mobidin Tanderil  Ascriptin Extra Strength Dolobid Moblgesic Ticlid  Ascriptin with Codeine Doloprin or Doloprin with Codeine Momentum Tolectin  Asperbuf Duoprin Mono-gesic Trendar  Aspergum Duradyne Motrin or Motrin IB Triminicin  Aspirin plain, buffered or enteric coated Durasal Myochrisine Trigesic  Aspirin Suppositories Easprin Nalfon Trillsate  Aspirin with Codeine Ecotrin Regular or Extra Strength Naprosyn Uracel  Atromid-S Efficin Naproxen Ursinus  Auranofin Capsules Elmiron Neocylate Vanquish  Axotal Emagrin Norgesic Verin  Azathioprine Empirin or Empirin with Codeine Normiflo Vitamin E  Azolid Emprazil Nuprin Voltaren  Bayer Aspirin plain, buffered or children's or timed BC Tablets or powders Encaprin Orgaran Warfarin Sodium  Buff-a-Comp Enoxaparin Orudis Zorpin  Buff-a-Comp with Codeine Equegesic Os-Cal-Gesic   Buffaprin Excedrin plain, buffered or Extra Strength Oxalid   Bufferin Arthritis Strength Feldene Oxphenbutazone   Bufferin plain or Extra Strength Feldene Capsules Oxycodone with Aspirin   Bufferin with Codeine Fenoprofen Fenoprofen Pabalate or Pabalate-SF   Buffets II Flogesic Panagesic   Buffinol plain or Extra Strength Florinal or Florinal with Codeine Panwarfarin   Buf-Tabs Flurbiprofen Penicillamine   Butalbital Compound Four-way cold tablets Penicillin   Butazolidin Fragmin Pepto-Bismol   Carbenicillin Geminisyn Percodan   Carna Arthritis Reliever Geopen Persantine   Carprofen Gold's salt Persistin   Chloramphenicol Goody's Phenylbutazone   Chloromycetin Haltrain Piroxlcam   Clmetidine heparin Plaquenil   Cllnoril Hyco-pap Ponstel   Clofibrate Hydroxy chloroquine Propoxyphen         Before stopping any of these medications, be sure to consult the  physician who ordered them.  Some, such as Coumadin (Warfarin) are ordered to prevent or treat serious conditions such as "deep thrombosis", "pumonary embolisms", and other heart problems.  The amount of time that you may need off of the medication may also vary with the medication and the reason for which you were taking it.  If you are taking any of these medications, please make sure you notify your pain physician before you undergo any procedures.

## 2016-08-04 ENCOUNTER — Telehealth: Payer: Self-pay | Admitting: Pain Medicine

## 2016-08-04 NOTE — Telephone Encounter (Signed)
Spoke with [name] re; Kelli Gutierrez re; stopping Plavix prior to procedure.  Instructed to stop x 7 days.  Also, she stated that Kelli Gutierrez does not want to take the tramadol that it does not do her any good and states that she told Dr Dossie Arbour this at last appt.  Would like something different for pain.  Should we try and schedule earlier appt than 08/18/16, which is procedure appt time?

## 2016-08-04 NOTE — Telephone Encounter (Signed)
Rip Harbour from [facility] takes care of Kelli Gutierrez and has some questions regarding medications, it seems the patient is having trouble filling her script. Please call asap

## 2016-08-04 NOTE — Telephone Encounter (Signed)
Spoke back with Kelli Gutierrez to let her know that Dr Dossie Arbour may not change her medication and that if she feels she needs something different she will need to schedule appt to discuss this with him.  [name] will talk with patient and see if she would like to schedule an additional appt.

## 2016-08-10 ENCOUNTER — Ambulatory Visit: Payer: No Typology Code available for payment source | Admitting: Pain Medicine

## 2016-08-11 ENCOUNTER — Ambulatory Visit: Payer: No Typology Code available for payment source | Admitting: Pain Medicine

## 2016-08-18 ENCOUNTER — Encounter: Payer: Self-pay | Admitting: Pain Medicine

## 2016-08-18 ENCOUNTER — Ambulatory Visit (HOSPITAL_BASED_OUTPATIENT_CLINIC_OR_DEPARTMENT_OTHER): Payer: Medicare Other | Admitting: Pain Medicine

## 2016-08-18 ENCOUNTER — Ambulatory Visit
Admission: RE | Admit: 2016-08-18 | Discharge: 2016-08-18 | Disposition: A | Discharge status: Home / Self Care | Payer: Medicare Other | Source: Ambulatory Visit | Attending: Pain Medicine | Admitting: Pain Medicine

## 2016-08-18 VITALS — BP 173/94 | HR 66 | Temp 98.0°F | Resp 21 | Ht 62.0 in | Wt 128.0 lb

## 2016-08-18 DIAGNOSIS — M19012 Primary osteoarthritis, left shoulder: Secondary | ICD-10-CM | POA: Insufficient documentation

## 2016-08-18 DIAGNOSIS — M25511 Pain in right shoulder: Secondary | ICD-10-CM | POA: Insufficient documentation

## 2016-08-18 DIAGNOSIS — M25512 Pain in left shoulder: Secondary | ICD-10-CM

## 2016-08-18 DIAGNOSIS — G8929 Other chronic pain: Secondary | ICD-10-CM | POA: Insufficient documentation

## 2016-08-18 DIAGNOSIS — M19011 Primary osteoarthritis, right shoulder: Secondary | ICD-10-CM | POA: Insufficient documentation

## 2016-08-18 DIAGNOSIS — Z9889 Other specified postprocedural states: Secondary | ICD-10-CM | POA: Diagnosis not present

## 2016-08-18 DIAGNOSIS — Z9049 Acquired absence of other specified parts of digestive tract: Secondary | ICD-10-CM | POA: Insufficient documentation

## 2016-08-18 MED ORDER — ROPIVACAINE HCL 2 MG/ML IJ SOLN
9.0000 mL | Freq: Once | INTRAMUSCULAR | Status: AC
  Administered 2016-08-18: 9 mL via PERINEURAL
  Filled 2016-08-18: qty 10

## 2016-08-18 MED ORDER — LIDOCAINE HCL (PF) 1 % IJ SOLN
10.0000 mL | Freq: Once | INTRAMUSCULAR | Status: AC
  Administered 2016-08-18: 5 mL
  Filled 2016-08-18: qty 10

## 2016-08-18 MED ORDER — TRIAMCINOLONE ACETONIDE 40 MG/ML IJ SUSP
40.0000 mg | Freq: Once | INTRAMUSCULAR | Status: AC
  Administered 2016-08-18: 40 mg
  Filled 2016-08-18: qty 1

## 2016-08-18 NOTE — Progress Notes (Signed)
Safety precautions to be maintained throughout the outpatient stay will include: orient to surroundings, keep bed in low position, maintain call bell within reach at all times, provide assistance with transfer out of bed and ambulation.  

## 2016-08-18 NOTE — Patient Instructions (Addendum)
Post-Procedure instructions Instructions:  Apply ice: Fill a plastic sandwich bag with crushed ice. Cover it with a small towel and apply to injection site. Apply for 15 minutes then remove x 15 minutes. Repeat sequence on day of procedure, until you go to bed. The purpose is to minimize swelling and discomfort after procedure.  Apply heat: Apply heat to procedure site starting the day following the procedure. The purpose is to treat any soreness and discomfort from the procedure.  Food intake: Start with clear liquids (like water) and advance to regular food, as tolerated.   Physical activities: Keep activities to a minimum for the first 8 hours after the procedure.   Driving: If you have received any sedation, you are not allowed to drive for 24 hours after your procedure.  Blood thinner: Restart your blood thinner 6 hours after your procedure. (Only for those taking blood thinners)  Insulin: As soon as you can eat, you may resume your normal dosing schedule. (Only for those taking insulin)  Infection prevention: Keep procedure site clean and dry.  Post-procedure Pain Diary: Extremely important that this be done correctly and accurately. Recorded information will be used to determine the next step in treatment.  Pain evaluated is that of treated area only. Do not include pain from an untreated area.  Complete every hour, on the hour, for the initial 8 hours. Set an alarm to help you do this part accurately.  Do not go to sleep and have it completed later. It will not be accurate.  Follow-up appointment: Keep your follow-up appointment after the procedure. Usually 2 weeks for most procedures. (6 weeks in the case of radiofrequency.) Bring you pain diary.  Expect:  From numbing medicine (AKA: Local Anesthetics): Numbness or decrease in pain.  Onset: Full effect within 15 minutes of injected.  Duration: It will depend on the type of local anesthetic used. On the average, 1 to 8  hours.   From steroids: Decrease in swelling or inflammation. Once inflammation is improved, relief of the pain will follow.  Onset of benefits: Depends on the amount of swelling present. The more swelling, the longer it will take for the benefits to be seen.   Duration: Steroids will stay in the system x 2 weeks. Duration of benefits will depend on multiple posibilities including persistent irritating factors.  From procedure: Some discomfort is to be expected once the numbing medicine wears off. This should be minimal if ice and heat are applied as instructed. Call if:  You experience numbness and weakness that gets worse with time, as opposed to wearing off.  New onset bowel or bladder incontinence. (Spinal procedures only)  Emergency Numbers:  Durning business hours (Monday - Thursday, 8:00 AM - 4:00 PM) (Friday, 9:00 AM - 12:00 Noon): [phone]  After hours: [phone] _____________________________________________________________________________________________  ____________________________________________________________________________________________  Medication Rules  Applies to: All patients receiving prescriptions (written or electronic).  Pharmacy of record: Pharmacy where electronic prescriptions will be sent. If written prescriptions are taken to a different pharmacy, please inform the nursing staff. The pharmacy listed in the electronic medical record should be the one where you would like electronic prescriptions to be sent.  Prescription refills: Only during scheduled appointments. Applies to both, written and electronic prescriptions.  NOTE: The following applies primarily to controlled substances (Opioid Pain Medications)  Patient's responsibilities: 1. Pain Pills: Bring all pain pills to every appointment (except for procedure appointments). 2. Pill Bottles: Bring pills in original pharmacy bottle. Always bring newest   bottle. Bring bottle, even if  empty. 3. Medication refills: You are responsible for knowing and keeping track of what medications you need refilled. The day before your appointment, write a list of all prescriptions that need to be refilled. Bring that list to your appointment and give it to the admitting nurse. Prescriptions will be written only during appointments. If you forget a medication, it will not be "Called in", "Faxed", or "electronically sent". You will need to get another appointment to get these prescribed. 4. Prescription Accuracy: You are responsible for carefully inspecting your prescriptions before leaving our office. Have the discharge nurse carefully go over each prescription with you, before taking them home. Make sure that your name is accurately spelled, that your address is correct. Check the name and dose of your medication to make sure it is accurate. Check the number of pills, and the written instructions to make sure they are clear and accurate. Make sure that you are given enough medication to last until your next medication refill appointment. 5. Taking Medication: Take medication as prescribed. Never take more pills than instructed. Never take medication more frequently than prescribed. Taking less pills or less frequently is permitted and encouraged, when it comes to controlled substances (written prescriptions).  6. Inform other Doctors: Always inform, all of your healthcare providers, of all the medications you take. 7. Pain Medication from other Providers: You are not allowed to accept any additional pain medication from any other Doctor or Healthcare provider. There are two exceptions to this rule. (see below) In the event that you require additional pain medication, you are responsible for notifying us, as stated below. 8. Medication Agreement: You are responsible for carefully reading and following our Medication Agreement. This must be signed before receiving any prescriptions from our practice.  Safely store a copy of your signed Agreement. Violations to the Agreement will result in no further prescriptions. (Additional copies of our Medication Agreement are available upon request.) 9. Laws, Rules, & Regulations: All patients are expected to follow all Federal and [employer], Rules, [employer]. Ignorance of the Laws does not constitute a valid excuse.  Exceptions: There are only two exceptions to the rule of not receiving pain medications from other Healthcare Providers. 1. Exception #1 (Emergencies): In the event of an emergency (i.e.: accident requiring emergency care), you are allowed to receive additional pain medication. However, you are responsible for: As soon as you are able, call our office [phone], at any time of the day or night, and leave a message stating your name, the date and nature of the emergency, and the name and dose of the medication prescribed. In the event that your call is answered by a member of our staff, make sure to document and save the date, time, and the name of the person that took your information.  2. Exception #2 (Planned Surgery): In the event that you are scheduled by another doctor or dentist to have any type of surgery or procedure, you are allowed (for a period no longer than 30 days), to receive additional pain medication, for the acute post-op pain. However, in this case, you are responsible for picking up a copy of our "Post-op Pain Management for Surgeons" handout, and giving it to your surgeon or dentist. This document is available at our office, and does not require an appointment to obtain it. Simply go to our office during business hours (Monday-Thursday from 8:00 AM to 4:00 PM) (Friday 8:00 AM to 12:00 Noon)  or if you have a scheduled appointment with us, prior to your surgery, and ask for it by name. In addition, you will need to provide us with your name, name of your surgeon, type of surgery, and date of procedure or  surgery.  _____________________________________________________________________________________________Pain Score  Introduction: The pain score used by this practice is the Verbal Numerical Rating Scale (VNRS-11). This is an 11-point scale. It is for adults and children 10 years or older. There are significant differences in how the pain score is reported, used, and applied. Forget everything you learned in the past and learn this scoring system.  General Information: The scale should reflect your current level of pain. Unless you are specifically asked for the level of your worst pain, or your average pain. If you are asked for one of these two, then it should be understood that it is over the past 24 hours.  Basic Activities of Daily Living (ADL): Personal hygiene, dressing, eating, transferring, and using restroom.  Instructions: Most patients tend to report their level of pain as a combination of two factors, their physical pain and their psychosocial pain. This last one is also known as "suffering" and it is reflection of how physical pain affects you socially and psychologically. From now on, report them separately. From this point on, when asked to report your pain level, report only your physical pain. Use the following table for reference.  Pain Clinic Pain Levels (0-5/10)  Pain Level Score Description  No Pain 0   Mild pain 1 Nagging, annoying, but does not interfere with basic activities of daily living (ADL). Patients are able to eat, bathe, get dressed, toileting (being able to get on and off the toilet and perform personal hygiene functions), transfer (move in and out of bed or a chair without assistance), and maintain continence (able to control bladder and bowel functions). Blood pressure and heart rate are unaffected. A normal heart rate for a healthy adult ranges from 60 to 100 bpm (beats per minute).   Mild to moderate pain 2 Noticeable and distracting. Impossible to hide from  other people. More frequent flare-ups. Still possible to adapt and function close to normal. It can be very annoying and may have occasional stronger flare-ups. With discipline, patients may get used to it and adapt.   Moderate pain 3 Interferes significantly with activities of daily living (ADL). It becomes difficult to feed, bathe, get dressed, get on and off the toilet or to perform personal hygiene functions. Difficult to get in and out of bed or a chair without assistance. Very distracting. With effort, it can be ignored when deeply involved in activities.   Moderately severe pain 4 Impossible to ignore for more than a few minutes. With effort, patients may still be able to manage work or participate in some social activities. Very difficult to concentrate. Signs of autonomic nervous system discharge are evident: dilated pupils (mydriasis); mild sweating (diaphoresis); sleep interference. Heart rate becomes elevated (>115 bpm). Diastolic blood pressure (lower number) rises above 100 mmHg. Patients find relief in laying down and not moving.   Severe pain 5 Intense and extremely unpleasant. Associated with frowning face and frequent crying. Pain overwhelms the senses.  Ability to do any activity or maintain social relationships becomes significantly limited. Conversation becomes difficult. Pacing back and forth is common, as getting into a comfortable position is nearly impossible. Pain wakes you up from deep sleep. Physical signs will be obvious: pupillary dilation; increased sweating; goosebumps; brisk reflexes; cold, clammy   hands and feet; nausea, vomiting or dry heaves; loss of appetite; significant sleep disturbance with inability to fall asleep or to remain asleep. When persistent, significant weight loss is observed due to the complete loss of appetite and sleep deprivation.  Blood pressure and heart rate becomes significantly elevated. Caution: If elevated blood pressure triggers a pounding  headache associated with blurred vision, then the patient should immediately seek attention at an urgent or emergency care unit, as these may be signs of an impending stroke.    Emergency Department Pain Levels (6-10/10)  Emergency Room Pain 6 Severely limiting. Requires emergency care and should not be seen or managed at an outpatient pain management facility. Communication becomes difficult and requires great effort. Assistance to reach the emergency department may be required. Facial flushing and profuse sweating along with potentially dangerous increases in heart rate and blood pressure will be evident.   Distressing pain 7 Self-care is very difficult. Assistance is required to transport, or use restroom. Assistance to reach the emergency department will be required. Tasks requiring coordination, such as bathing and getting dressed become very difficult.   Disabling pain 8 Self-care is no longer possible. At this level, pain is disabling. The individual is unable to do even the most "basic" activities such as walking, eating, bathing, dressing, transferring to a bed, or toileting. Fine motor skills are lost. It is difficult to think clearly.   Incapacitating pain 9 Pain becomes incapacitating. Thought processing is no longer possible. Difficult to remember your own name. Control of movement and coordination are lost.   The worst pain imaginable 10 At this level, most patients pass out from pain. When this level is reached, collapse of the autonomic nervous system occurs, leading to a sudden drop in blood pressure and heart rate. This in turn results in a temporary and dramatic drop in blood flow to the brain, leading to a loss of consciousness. Fainting is one of the body's self defense mechanisms. Passing out puts the brain in a calmed state and causes it to shut down for a while, in order to begin the healing process.    Summary: 1. Refer to this scale when providing [place] with your pain  level. 2. Be accurate and careful when reporting your pain level. This will help with your care. 3. Over-reporting your pain level will lead to loss of credibility. 4. Even a level of 1/10 means that there is pain and will be treated at our facility. 5. High, inaccurate reporting will be documented as "Symptom Exaggeration", leading to loss of credibility and suspicions of possible secondary gains such as obtaining more narcotics, or wanting to appear disabled, for fraudulent reasons. 6. Only pain levels of 5 or below will be seen at our facility. 7. Pain levels of 6 and above will be sent to the Emergency Department and the appointment cancelled. _____________________________________________________________________________________________  Post-procedure Information What to expect: Most procedures involve the use of a local anesthetic (numbing medicine), and a steroid (anti-inflammatory medicine).  The local anesthetics may cause temporary numbness and weakness of the legs or arms, depending on the location of the block. This numbness/weakness may last 4-6 hours, depending on the local anesthetic used. In rare instances, it can last up to 24 hours. While numb, you must be very careful not to injure the extremity.  After any procedure, you could expect the pain to get better within 15-20 minutes. This relief is temporary and may last 4-6 hours. Once the local anesthetics wears off, you  could experience discomfort, possibly more than usual, for up to 10 (ten) days. In the case of radiofrequencies, it may last up to 6 weeks. Surgeries may take up to 8 weeks for the healing process. The discomfort is due to the irritation caused by needles going through skin and muscle. To minimize the discomfort, we recommend using ice the first day, and heat from then on. The ice should be applied for 15 minutes on, and 15 minutes off. Keep repeating this cycle until bedtime. Avoid applying the ice directly to the  skin, to prevent frostbite. Heat should be used daily, until the pain improves (4-10 days). Be careful not to burn yourself.  Occasionally you may experience muscle spasms or cramps. These occur as a consequence of the irritation caused by the needle sticks to the muscle and the blood that will inevitably be lost into the surrounding muscle tissue. Blood tends to be very irritating to tissues, which tend to react by going into spasm. These spasms may start the same day of your procedure, but they may also take days to develop. This late onset type of spasm or cramp is usually caused by electrolyte imbalances triggered by the steroids, at the level of the kidney. Cramps and spasms tend to respond well to muscle relaxants, multivitamins (some are triggered by the procedure, but may have their origins in vitamin deficiencies), and "Gatorade", or any sports drinks that can replenish any electrolyte imbalances. (If you are a diabetic, ask your pharmacist to get you a sugar-free brand.) Warm showers or baths may also be helpful. Stretching exercises are highly recommended. General Instructions:  Be alert for signs of possible infection: redness, swelling, heat, red streaks, elevated temperature, and/or fever. These typically appear 4 to 6 days after the procedure. Immediately notify your doctor if you experience unusual bleeding, difficulty breathing, or loss of bowel or bladder control. If you experience increased pain, do not increase your pain medicine intake, unless instructed by your pain physician. Post-Procedure Care:  Be careful in moving about. Muscle spasms in the area of the injection may occur. Applying ice or heat to the area is often helpful. The incidence of spinal headaches after epidural injections ranges between 1.4% and 6%. If you develop a headache that does not seem to respond to conservative therapy, please let your physician know. This can be treated with an epidural blood  patch.   Post-procedure numbness or redness is to be expected, however it should average 4 to 6 hours. If numbness and weakness of your extremities begins to develop 4 to 6 hours after your procedure, and is felt to be progressing and worsening, immediately contact your physician.   Diet:  If you experience nausea, do not eat until this sensation goes away. If you had a "Stellate Ganglion Block" for upper extremity "Reflex Sympathetic Dystrophy", do not eat or drink until your hoarseness goes away. In any case, always start with liquids first and if you tolerate them well, then slowly progress to more solid foods. Activity:  For the first 4 to 6 hours after the procedure, use caution in moving about as you may experience numbness and/or weakness. Use caution in cooking, using household electrical appliances, and climbing steps. If you need to reach your Doctor call our office: [phone]) [phone] Monday-Thursday 8:00 am - 4:00 PM    Fridays: Closed     In case of an emergency: In case of emergency, call 911 or go to the nearest emergency room and have the  physician there call us.  Interpretation of Procedure Every nerve block has two components: a diagnostic component, and a treatment component. Unrealistic expectations are the most common causes of "perceived failure".  In a perfect world, a single nerve block should be able to completely and permanently eliminate the pain. Sadly, the world is not perfect.  Most pain management nerve blocks are performed using local anesthetics and steroids. Steroids are responsible for any long-term benefit that you may experience. Their purpose is to decrease any chronic swelling that may exist in the area. Steroids begin to work immediately after being injected. However, most patients will not experience any benefits until 5 to 10 days after the injection, when the swelling has come down to the point where they can tell a difference. Steroids will only help if there  is swelling to be treated. As such, they can assist with the diagnosis. If effective, they suggest an inflammatory component to the pain, and if ineffective, they rule out inflammation as the main cause or component of the problem. If the problem is one of mechanical compression, you will get no benefit from those steroids.   In the case of local anesthetics, they have a crucial role in the diagnosis of your condition. Most will begin to work within15 to 20 minutes after injection. The duration will depend on the type used (short- vs. Long-acting). It is of outmost importance that patients keep tract of their pain, after the procedure. To assist with this matter, a "Post-procedure Pain Diary" is provided. Make sure to complete it and to bring it back to your follow-up appointment.  As long as the patient keeps accurate, detailed records of their symptoms after every procedure, and returns to have those interpreted, every procedure will provide [place] with invaluable information. Even a block that does not provide the patient with any relief, will always provide [place] with information about the mechanism and the origin of the pain. The only time a nerve block can be considered a waste of time is when patients do not keep track of the results, or do not keep their post-procedure appointment.  Reporting the results back to your physician The Pain Score  Pain is a subjective complaint. It cannot be seen, touched, or measured. We depend entirely on the patient's report of the pain in order to assess your condition and treatment. To evaluate the pain, we use a pain scale, where "0" means "No Pain", and a "10" is "the worst possible pain that you can even imagine" (i.e. something like been eaten alive by a shark or being torn apart by a lion).   You will frequently be asked to rate your pain. Please be as accurate, remember that medical decisions will be based on your responses. Please do not rate your pain above a 10.  Doing so is actually interpreted as "symptom magnification" (exaggeration), as well as lack of understanding with regards to the scale. To put this into perspective, when you tell us that your pain is at a 10 (ten), what you are saying is that there is nothing we can do to make this pain any worse. (Carefully think about that.)Post-procedure Information What to expect: Most procedures involve the use of a local anesthetic (numbing medicine), and a steroid (anti-inflammatory medicine).  The local anesthetics may cause temporary numbness and weakness of the legs or arms, depending on the location of the block. This numbness/weakness may last 4-6 hours, depending on the local anesthetic used. In rare instances, it  can last up to 24 hours. While numb, you must be very careful not to injure the extremity.  After any procedure, you could expect the pain to get better within 15-20 minutes. This relief is temporary and may last 4-6 hours. Once the local anesthetics wears off, you could experience discomfort, possibly more than usual, for up to 10 (ten) days. In the case of radiofrequencies, it may last up to 6 weeks. Surgeries may take up to 8 weeks for the healing process. The discomfort is due to the irritation caused by needles going through skin and muscle. To minimize the discomfort, we recommend using ice the first day, and heat from then on. The ice should be applied for 15 minutes on, and 15 minutes off. Keep repeating this cycle until bedtime. Avoid applying the ice directly to the skin, to prevent frostbite. Heat should be used daily, until the pain improves (4-10 days). Be careful not to burn yourself.  Occasionally you may experience muscle spasms or cramps. These occur as a consequence of the irritation caused by the needle sticks to the muscle and the blood that will inevitably be lost into the surrounding muscle tissue. Blood tends to be very irritating to tissues, which tend to react by going into  spasm. These spasms may start the same day of your procedure, but they may also take days to develop. This late onset type of spasm or cramp is usually caused by electrolyte imbalances triggered by the steroids, at the level of the kidney. Cramps and spasms tend to respond well to muscle relaxants, multivitamins (some are triggered by the procedure, but may have their origins in vitamin deficiencies), and "Gatorade", or any sports drinks that can replenish any electrolyte imbalances. (If you are a diabetic, ask your pharmacist to get you a sugar-free brand.) Warm showers or baths may also be helpful. Stretching exercises are highly recommended. General Instructions:  Be alert for signs of possible infection: redness, swelling, heat, red streaks, elevated temperature, and/or fever. These typically appear 4 to 6 days after the procedure. Immediately notify your doctor if you experience unusual bleeding, difficulty breathing, or loss of bowel or bladder control. If you experience increased pain, do not increase your pain medicine intake, unless instructed by your pain physician. Post-Procedure Care:  Be careful in moving about. Muscle spasms in the area of the injection may occur. Applying ice or heat to the area is often helpful. The incidence of spinal headaches after epidural injections ranges between 1.4% and 6%. If you develop a headache that does not seem to respond to conservative therapy, please let your physician know. This can be treated with an epidural blood patch.   Post-procedure numbness or redness is to be expected, however it should average 4 to 6 hours. If numbness and weakness of your extremities begins to develop 4 to 6 hours after your procedure, and is felt to be progressing and worsening, immediately contact your physician.   Diet:  If you experience nausea, do not eat until this sensation goes away. If you had a "Stellate Ganglion Block" for upper extremity "Reflex Sympathetic  Dystrophy", do not eat or drink until your hoarseness goes away. In any case, always start with liquids first and if you tolerate them well, then slowly progress to more solid foods. Activity:  For the first 4 to 6 hours after the procedure, use caution in moving about as you may experience numbness and/or weakness. Use caution in cooking, using household electrical appliances, and climbing  steps. If you need to reach your Doctor call our office: [phone] Monday-Thursday 8:00 am - 4:00 PM    Fridays: Closed     In case of an emergency: In case of emergency, call 911 or go to the nearest emergency room and have the physician there call us.  Interpretation of Procedure Every nerve block has two components: a diagnostic component, and a treatment component. Unrealistic expectations are the most common causes of "perceived failure".  In a perfect world, a single nerve block should be able to completely and permanently eliminate the pain. Sadly, the world is not perfect.  Most pain management nerve blocks are performed using local anesthetics and steroids. Steroids are responsible for any long-term benefit that you may experience. Their purpose is to decrease any chronic swelling that may exist in the area. Steroids begin to work immediately after being injected. However, most patients will not experience any benefits until 5 to 10 days after the injection, when the swelling has come down to the point where they can tell a difference. Steroids will only help if there is swelling to be treated. As such, they can assist with the diagnosis. If effective, they suggest an inflammatory component to the pain, and if ineffective, they rule out inflammation as the main cause or component of the problem. If the problem is one of mechanical compression, you will get no benefit from those steroids.   In the case of local anesthetics, they have a crucial role in the diagnosis of your condition. Most will begin to  work within15 to 20 minutes after injection. The duration will depend on the type used (short- vs. Long-acting). It is of outmost importance that patients keep tract of their pain, after the procedure. To assist with this matter, a "Post-procedure Pain Diary" is provided. Make sure to complete it and to bring it back to your follow-up appointment.  As long as the patient keeps accurate, detailed records of their symptoms after every procedure, and returns to have those interpreted, every procedure will provide [place] with invaluable information. Even a block that does not provide the patient with any relief, will always provide [place] with information about the mechanism and the origin of the pain. The only time a nerve block can be considered a waste of time is when patients do not keep track of the results, or do not keep their post-procedure appointment.  Reporting the results back to your physician The Pain Score  Pain is a subjective complaint. It cannot be seen, touched, or measured. We depend entirely on the patient's report of the pain in order to assess your condition and treatment. To evaluate the pain, we use a pain scale, where "0" means "No Pain", and a "10" is "the worst possible pain that you can even imagine" (i.e. something like been eaten alive by a shark or being torn apart by a lion).   You will frequently be asked to rate your pain. Please be as accurate, remember that medical decisions will be based on your responses. Please do not rate your pain above a 10. Doing so is actually interpreted as "symptom magnification" (exaggeration), as well as lack of understanding with regards to the scale. To put this into perspective, when you tell us that your pain is at a 10 (ten), what you are saying is that there is nothing we can do to make this pain any worse. (Carefully think about that.)Trigger Point Injections Patient Information  Description: Trigger points are areas  of muscle sensitive to touch  which cause pain with movement, sometimes felt some distance from the site of palpation.  Usually the muscle containing these trigger points if felt as a tight band or knot.   The area of maximum tenderness or trigger point is identified, and after antiseptic preparation of the skin, a small needle is placed into this site.  Reproduction of the pain often occurs and numbing medicine (local anesthetic) is injected into the site, sometimes along with steroid preparation.  The entire block usually lasts less than 5 minutes.  Conditions which may be treated by trigger points:   Muscular pain and spasm  Nerve irritation  Preparation for the injection:  10. Do not eat any solid food or dairy products within 8 hours of your appointment. 11. You may drink clear liquids up to 3 hours before appointment.  Clear liquids include water, black coffee, juice or soda.  No milk or cream please. 12. You may take your regular medications, including pain medications, with a sip of water before your appointment.  Diabetics should hold regular insulin ( if take separately) and take 1/2 normal NPH dose the morning of the procedure.  Carry some sugar containing items with you to your appointment. 13. A driver must accompany you and be prepared to drive you home after your procedure.  87. Bring all your current medications with you. 15. An IV may be inserted and sedation may be given at the discretion of the physician.  16. A blood pressure cuff, EKG, and other monitors will often be applied during the procedure.  Some patients may need to have extra oxygen administered for a short period. 93. You will be asked to provide medical information, including your allergies and medications, prior to the procedure.  We must know immediately if you are taking blood thinners (like Coumadin/Warfarin) or if you are allergic to IV iodine contrast (dye).  We must know if you could possibly be pregnant.  Possible  side-effects:   Bleeding from needle site  Infection (rare, may require surgery)  Nerve injury (rare)  Numbness & tingling (temporary)  Punctured lung (if injection around chest)  Light-headedness (temporary)  Pain at injection site (several days)  Decreased blood pressure (rare, temporary)  Weakness in arm/leg (temporary)  Call if you experience:   Hive or difficulty breathing (go to the emergency room)  Inflammation or drainage at the injection site(s)  Please note:  Although the local anesthetic injected can often make your painful muscle feel good for several hours after the injection, the pain may return.  It takes 3-7 days for steroids to work.  You may not notice any pain relief for at least one week.  If effective, we will often do a series of injections spaced 3-6 weeks apart to maximally decrease your pain.  If you have any questions please call [phone] [facility]

## 2016-08-18 NOTE — Progress Notes (Signed)
Patient's Name: Kelli Gutierrez  MRN: [id number]  Referring Provider: [facility]  DOB: 03-26-1951  PCP: [facility] [place]  DOS: 08/18/2016  Note by: Kathlen Brunswick. Dossie Arbour, MD  Service setting: Ambulatory outpatient  Location: [facility] (AMB) Pain Management Facility  Visit type: Procedure  Specialty: Interventional Pain Management  Patient type: Established   Primary Reason for Visit: Interventional Pain Management Treatment. CC: Shoulder Pain (bilateral)  Procedure:  Anesthesia, Analgesia, Anxiolysis:  Type: Diagnostic Suprascapular nerve Block Region: Posterior Shoulder & Scapular Areas Level: Superior to the scapular spine, in the lateral aspect of the supraspinatus fossa (Suprescapular notch). Laterality: Bilateral  Type: Local Anesthesia Local Anesthetic: Lidocaine 1% Route: Infiltration (Brier/IM) IV Access: Declined Sedation: Declined  Indication(s): Analgesia          Indications: 1. Chronic shoulder pain (Location of Primary Source of Pain) (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)   2. 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 (year old), female patient, seen today for interventional treatment. She  has a past surgical history that includes Middle ear surgery; Appendectomy; Cholecystectomy; and Cesarean section. Her primarily concern today is the Shoulder Pain (bilateral)  Initial Vital Signs: There were no vitals taken for this visit. BMI: 23.41 kg/m  Risk Assessment: Allergies: Reviewed. She has No Known Allergies.  Allergy Precautions: None required Coagulopathies: Reviewed. None identified.  Blood-thinner therapy: None at this time Active Infection(s): Reviewed. None identified. Kelli Gutierrez is afebrile  Site Confirmation: Kelli Gutierrez was asked to  confirm the procedure and laterality before marking the site Procedure checklist: Completed Consent: Before the procedure and under the influence of no sedative(s), amnesic(s), or anxiolytics, the patient was informed of the treatment options, risks and possible complications. To fulfill our ethical and legal obligations, as recommended by the American Medical Association's Code of Ethics, I have informed the patient of my clinical impression; the nature and purpose of the treatment or procedure; the risks, benefits, and possible complications of the intervention; the alternatives, including doing nothing; the risk(s) and benefit(s) of the alternative treatment(s) or procedure(s); and the risk(s) and benefit(s) of doing nothing. The patient was provided information about the general risks and possible complications associated with the procedure. These may include, but are not limited to: failure to achieve desired goals, infection, bleeding, organ or nerve damage, allergic reactions, paralysis, and death. In addition, the patient was informed of those risks and complications associated to the procedure, such as failure to decrease pain; infection; bleeding; organ or nerve damage with subsequent damage to sensory, motor, and/or autonomic systems, resulting in permanent pain, numbness, and/or weakness of one or several areas of the body; allergic reactions; (i.e.: anaphylactic reaction); and/or death. Furthermore, the patient was informed of those risks and complications associated with the medications. These include, but are not limited to: allergic reactions (i.e.: anaphylactic or anaphylactoid reaction(s)); adrenal axis suppression; blood sugar elevation that in diabetics may result in ketoacidosis or comma; water retention that in patients with history of congestive heart failure may result in shortness of breath, pulmonary edema, and decompensation with resultant heart failure; weight gain; swelling or  edema; medication-induced neural toxicity; particulate matter embolism and blood vessel occlusion with resultant organ, and/or nervous system infarction; and/or aseptic necrosis of one or more joints. Finally, the patient was informed that Medicine is not an  exact science; therefore, there is also the possibility of unforeseen or unpredictable risks and/or possible complications that may result in a catastrophic outcome. The patient indicated having understood very clearly. We have given the patient no guarantees and we have made no promises. Enough time was given to the patient to ask questions, all of which were answered to the patient's satisfaction. Kelli Gutierrez has indicated that she wanted to continue with the procedure. Attestation: I, the ordering provider, attest that I have discussed with the patient the benefits, risks, side-effects, alternatives, likelihood of achieving goals, and potential problems during recovery for the procedure that I have provided informed consent. Date: 08/18/2016; Time: 8:27 AM  Pre-Procedure Preparation:  Monitoring: As per clinic protocol. Respiration, ETCO2, SpO2, BP, heart rate and rhythm monitor placed and checked for adequate function Safety Precautions: Patient was assessed for positional comfort and pressure points before starting the procedure. Time-out: I initiated and conducted the "Time-out" before starting the procedure, as per protocol. The patient was asked to participate by confirming the accuracy of the "Time Out" information. Verification of the correct person, site, and procedure were performed and confirmed by me, the nursing staff, and the patient. "Time-out" conducted as per Joint Commission's Universal Protocol (UP.01.01.01). "Time-out" Date & Time: 08/18/2016; 0943 hrs.  Description of Procedure Process:   Position: Prone Target Area: Suprascapular notch. Approach: Posterior approach. Area Prepped: Entire shoulder Area Prepping solution: ChloraPrep  (2% chlorhexidine gluconate and 70% isopropyl alcohol) Safety Precautions: Aspiration looking for blood return was conducted prior to all injections. At no point did we inject any substances, as a needle was being advanced. No attempts were made at seeking any paresthesias. Safe injection practices and needle disposal techniques used. Medications properly checked for expiration dates. SDV (single dose vial) medications used. Description of the Procedure: Protocol guidelines were followed. The patient was placed in position over the procedure table. The target area was identified and the area prepped in the usual manner. Skin & deeper tissues infiltrated with local anesthetic. Appropriate amount of time allowed to pass for local anesthetics to take effect. The procedure needles were then advanced to the target area. Proper needle placement secured. Negative aspiration confirmed. Solution injected in intermittent fashion, asking for systemic symptoms every 0.5cc of injectate. The needles were then removed and the area cleansed, making sure to leave some of the prepping solution back to take advantage of its long term bactericidal properties. Vitals:   08/18/16 0937 08/18/16 0943 08/18/16 0948 08/18/16 0951  BP: (!) 192/81 (!) 181/94 (!) 198/87 (!) 173/94  Pulse:      Resp: 15 19 19  (!) 21  Temp:      TempSrc:      SpO2: 99% 98% 98% 98%  Weight:      Height:        Start Time: 0943 hrs. End Time: 0951 hrs. Materials:  Needle(s) Type: Regular needle Gauge: 22G Length: 3.5-in Medication(s): We administered triamcinolone acetonide, lidocaine (PF), and ropivacaine (PF) 2 mg/mL (0.2%). Please see chart orders for dosing details.  Imaging Guidance (Non-Spinal):  Type of Imaging Technique: Fluoroscopy Guidance (Non-Spinal) Indication(s): Assistance in needle guidance and placement for procedures requiring needle placement in or near specific anatomical locations not easily accessible without such  assistance. Exposure Time: Please see nurses notes. Contrast: Before injecting any contrast, we confirmed that the patient did not have an allergy to iodine, shellfish, or radiological contrast. Once satisfactory needle placement was completed at the desired level, radiological contrast was injected. Contrast  injected under live fluoroscopy. No contrast complications. See chart for type and volume of contrast used. Fluoroscopic Guidance: I was personally present during the use of fluoroscopy. "Tunnel Vision Technique" used to obtain the best possible view of the target area. Parallax error corrected before commencing the procedure. "Direction-depth-direction" technique used to introduce the needle under continuous pulsed fluoroscopy. Once target was reached, antero-posterior, oblique, and lateral fluoroscopic projection used confirm needle placement in all planes. Images permanently stored in EMR. Interpretation: I personally interpreted the imaging intraoperatively. Adequate needle placement confirmed in multiple planes. Appropriate spread of contrast into desired area was observed. No evidence of afferent or efferent intravascular uptake. Permanent images saved into the patient's record.  Antibiotic Prophylaxis:  Indication(s): None identified Antibiotic given: None  Post-operative Assessment:  EBL: None Complications: No immediate post-treatment complications observed by team, or reported by patient. Note: The patient tolerated the entire procedure well. A repeat set of vitals were taken after the procedure and the patient was kept under observation following institutional policy, for this type of procedure. Post-procedural neurological assessment was performed, showing return to baseline, prior to discharge. The patient was provided with post-procedure discharge instructions, including a section on how to identify potential problems. Should any problems arise concerning this procedure, the patient  was given instructions to immediately contact us, at any time, without hesitation. In any case, we plan to contact the patient by telephone for a follow-up status report regarding this interventional procedure. Comments:  No additional relevant information.  Plan of Care  Disposition: Discharge home  Discharge Date & Time: 08/18/2016; 0957 hrs.  Physician-requested Follow-up:  Return for post-procedure eval (in 2 wks), by MD.  Future Appointments Date Time Provider [place]  09/14/2016 10:00 AM Milinda Pointer, MD [facility] None   Medications ordered for procedure: Meds ordered this encounter  Medications  . triamcinolone acetonide (KENALOG-40) injection 40 mg  . lidocaine (PF) (XYLOCAINE) 1 % injection 10 mL  . ropivacaine (PF) 2 mg/mL (0.2%) (NAROPIN) injection 9 mL   Medications administered: We administered triamcinolone acetonide, lidocaine (PF), and ropivacaine (PF) 2 mg/mL (0.2%).  See the medical record for exact dosing, route, and time of administration.  Lab-work, Procedure(s), & Referral(s) Ordered: Orders Placed This Encounter  Procedures  . SUPRASCAPULAR NERVE BLOCK  . DG C-Arm 1-60 Min-No Report  . Informed Consent Details: Transcribe to consent form and obtain patient signature  . Provider attestation of informed consent for procedure/surgical case  . Verify informed consent  . Discharge instructions  . Follow-up   Imaging Ordered: No results found for this or any previous visit. New Prescriptions   No medications on file   Primary Care Physician: [facility] [place] Location: [facility] Note by: Kathlen Brunswick. Dossie Arbour, M.D, DABA, DABAPM, DABPM, DABIPP, FIPP Date: 08/18/2016; Time: 11:42 AM  Disclaimer:  Medicine is not an Chief Strategy Officer. The only guarantee in medicine is that nothing is guaranteed. It is important to note that the decision to proceed with this intervention was based on the information  collected from the patient. The Data and conclusions were drawn from the patient's questionnaire, the interview, and the physical examination. Because the information was provided in large part by the patient, it cannot be guaranteed that it has not been purposely or unconsciously manipulated. Every effort has been made to obtain as much relevant data as possible for this evaluation. It is important to note that the conclusions that lead to this procedure are derived in large part  from the available data. Always take into account that the treatment will also be dependent on availability of resources and existing treatment guidelines, considered by other Pain Management Practitioners as being common knowledge and practice, at the time of the intervention. For Medico-Legal purposes, it is also important to point out that variation in procedural techniques and pharmacological choices are the acceptable norm. The indications, contraindications, technique, and results of the above procedure should only be interpreted and judged by a Board-Certified Interventional Pain Specialist with extensive familiarity and expertise in the same exact procedure and technique.  Instructions provided at this appointment: Patient Instructions   Post-Procedure instructions Instructions:  Apply ice: Fill a plastic sandwich bag with crushed ice. Cover it with a small towel and apply to injection site. Apply for 15 minutes then remove x 15 minutes. Repeat sequence on day of procedure, until you go to bed. The purpose is to minimize swelling and discomfort after procedure.  Apply heat: Apply heat to procedure site starting the day following the procedure. The purpose is to treat any soreness and discomfort from the procedure.  Food intake: Start with clear liquids (like water) and advance to regular food, as tolerated.   Physical activities: Keep activities to a minimum for the first 8 hours after the procedure.   Driving: If  you have received any sedation, you are not allowed to drive for 24 hours after your procedure.  Blood thinner: Restart your blood thinner 6 hours after your procedure. (Only for those taking blood thinners)  Insulin: As soon as you can eat, you may resume your normal dosing schedule. (Only for those taking insulin)  Infection prevention: Keep procedure site clean and dry.  Post-procedure Pain Diary: Extremely important that this be done correctly and accurately. Recorded information will be used to determine the next step in treatment.  Pain evaluated is that of treated area only. Do not include pain from an untreated area.  Complete every hour, on the hour, for the initial 8 hours. Set an alarm to help you do this part accurately.  Do not go to sleep and have it completed later. It will not be accurate.  Follow-up appointment: Keep your follow-up appointment after the procedure. Usually 2 weeks for most procedures. (6 weeks in the case of radiofrequency.) Bring you pain diary.  Expect:  From numbing medicine (AKA: Local Anesthetics): Numbness or decrease in pain.  Onset: Full effect within 15 minutes of injected.  Duration: It will depend on the type of local anesthetic used. On the average, 1 to 8 hours.   From steroids: Decrease in swelling or inflammation. Once inflammation is improved, relief of the pain will follow.  Onset of benefits: Depends on the amount of swelling present. The more swelling, the longer it will take for the benefits to be seen.   Duration: Steroids will stay in the system x 2 weeks. Duration of benefits will depend on multiple posibilities including persistent irritating factors.  From procedure: Some discomfort is to be expected once the numbing medicine wears off. This should be minimal if ice and heat are applied as instructed. Call if:  You experience numbness and weakness that gets worse with time, as opposed to wearing off.  New onset bowel or  bladder incontinence. (Spinal procedures only)  Emergency Numbers:  [place] business hours (Monday - Thursday, 8:00 AM - 4:00 PM) (Friday, 9:00 AM - 12:00 Noon): [phone]  After hours: [phone] _____________________________________________________________________________________________  ____________________________________________________________________________________________  Medication Rules  Applies to:  All patients receiving prescriptions (written or electronic).  Pharmacy of record: Pharmacy where electronic prescriptions will be sent. If written prescriptions are taken to a different pharmacy, please inform the nursing staff. The pharmacy listed in the electronic medical record should be the one where you would like electronic prescriptions to be sent.  Prescription refills: Only during scheduled appointments. Applies to both, written and electronic prescriptions.  NOTE: The following applies primarily to controlled substances (Opioid Pain Medications)  Patient's responsibilities: 1. Pain Pills: Bring all pain pills to every appointment (except for procedure appointments). 2. Pill Bottles: Bring pills in original pharmacy bottle. Always bring newest bottle. Bring bottle, even if empty. 3. Medication refills: You are responsible for knowing and keeping track of what medications you need refilled. The day before your appointment, write a list of all prescriptions that need to be refilled. Bring that list to your appointment and give it to the admitting nurse. Prescriptions will be written only during appointments. If you forget a medication, it will not be "Called in", "Faxed", or "electronically sent". You will need to get another appointment to get these prescribed. 4. Prescription Accuracy: You are responsible for carefully inspecting your prescriptions before leaving our office. Have the discharge nurse carefully go over each prescription with you, before taking them  home. Make sure that your name is accurately spelled, that your address is correct. Check the name and dose of your medication to make sure it is accurate. Check the number of pills, and the written instructions to make sure they are clear and accurate. Make sure that you are given enough medication to last until your next medication refill appointment. 5. Taking Medication: Take medication as prescribed. Never take more pills than instructed. Never take medication more frequently than prescribed. Taking less pills or less frequently is permitted and encouraged, when it comes to controlled substances (written prescriptions).  6. Inform other Doctors: Always inform, all of your healthcare providers, of all the medications you take. 7. Pain Medication from other Providers: You are not allowed to accept any additional pain medication from any other Doctor or Healthcare provider. There are two exceptions to this rule. (see below) In the event that you require additional pain medication, you are responsible for notifying us, as stated below. 8. Medication Agreement: You are responsible for carefully reading and following our Medication Agreement. This must be signed before receiving any prescriptions from our practice. Safely store a copy of your signed Agreement. Violations to the Agreement will result in no further prescriptions. (Additional copies of our Medication Agreement are available upon request.) 9. Laws, Rules, & Regulations: All patients are expected to follow all Federal and [employer], Rules, [employer]. Ignorance of the Laws does not constitute a valid excuse.  Exceptions: There are only two exceptions to the rule of not receiving pain medications from other Healthcare Providers. 1. Exception #1 (Emergencies): In the event of an emergency (i.e.: accident requiring emergency care), you are allowed to receive additional pain medication. However, you are responsible for: As soon as you  are able, call our office [phone], at any time of the day or night, and leave a message stating your name, the date and nature of the emergency, and the name and dose of the medication prescribed. In the event that your call is answered by a member of our staff, make sure to document and save the date, time, and the name of the person that took your information.  2. Exception #2 (  Planned Surgery): In the event that you are scheduled by another doctor or dentist to have any type of surgery or procedure, you are allowed (for a period no longer than 30 days), to receive additional pain medication, for the acute post-op pain. However, in this case, you are responsible for picking up a copy of our "Post-op Pain Management for Surgeons" handout, and giving it to your surgeon or dentist. This document is available at our office, and does not require an appointment to obtain it. Simply go to our office during business hours (Monday-Thursday from 8:00 AM to 4:00 PM) (Friday 8:00 AM to 12:00 Noon) or if you have a scheduled appointment with [place], prior to your surgery, and ask for it by name. In addition, you will need to provide [place] with your name, name of your surgeon, type of surgery, and date of procedure or surgery.  _____________________________________________________________________________________________Pain Score  Introduction: The pain score used by this practice is the Verbal Numerical Rating Scale (VNRS-11). This is an 11-point scale. It is for adults and children 10 years or older. There are significant differences in how the pain score is reported, used, and applied. Forget everything you learned in the past and learn this scoring system.  General Information: The scale should reflect your current level of pain. Unless you are specifically asked for the level of your worst pain, or your average pain. If you are asked for one of these two, then it should be understood that it is over the past 24  hours.  Basic Activities of Daily Living (ADL): Personal hygiene, dressing, eating, transferring, and using restroom.  Instructions: Most patients tend to report their level of pain as a combination of two factors, their physical pain and their psychosocial pain. This last one is also known as "suffering" and it is reflection of how physical pain affects you socially and psychologically. From now on, report them separately. From this point on, when asked to report your pain level, report only your physical pain. Use the following table for reference.  Pain Clinic Pain Levels (0-5/10)  Pain Level Score Description  No Pain 0   Mild pain 1 Nagging, annoying, but does not interfere with basic activities of daily living (ADL). Patients are able to eat, bathe, get dressed, toileting (being able to get on and off the toilet and perform personal hygiene functions), transfer (move in and out of bed or a chair without assistance), and maintain continence (able to control bladder and bowel functions). Blood pressure and heart rate are unaffected. A normal heart rate for a healthy adult ranges from 60 to 100 bpm (beats per minute).   Mild to moderate pain 2 Noticeable and distracting. Impossible to hide from other people. More frequent flare-ups. Still possible to adapt and function close to normal. It can be very annoying and may have occasional stronger flare-ups. With discipline, patients may get used to it and adapt.   Moderate pain 3 Interferes significantly with activities of daily living (ADL). It becomes difficult to feed, bathe, get dressed, get on and off the toilet or to perform personal hygiene functions. Difficult to get in and out of bed or a chair without assistance. Very distracting. With effort, it can be ignored when deeply involved in activities.   Moderately severe pain 4 Impossible to ignore for more than a few minutes. With effort, patients may still be able to manage work or participate in  some social activities. Very difficult to concentrate. Signs of autonomic nervous  system discharge are evident: dilated pupils (mydriasis); mild sweating (diaphoresis); sleep interference. Heart rate becomes elevated (>115 bpm). Diastolic blood pressure (lower number) rises above 100 mmHg. Patients find relief in laying down and not moving.   Severe pain 5 Intense and extremely unpleasant. Associated with frowning face and frequent crying. Pain overwhelms the senses.  Ability to do any activity or maintain social relationships becomes significantly limited. Conversation becomes difficult. Pacing back and forth is common, as getting into a comfortable position is nearly impossible. Pain wakes you up from deep sleep. Physical signs will be obvious: pupillary dilation; increased sweating; goosebumps; brisk reflexes; cold, clammy hands and feet; nausea, vomiting or dry heaves; loss of appetite; significant sleep disturbance with inability to fall asleep or to remain asleep. When persistent, significant weight loss is observed due to the complete loss of appetite and sleep deprivation.  Blood pressure and heart rate becomes significantly elevated. Caution: If elevated blood pressure triggers a pounding headache associated with blurred vision, then the patient should immediately seek attention at an urgent or emergency care unit, as these may be signs of an impending stroke.    Emergency Department Pain Levels (6-10/10)  Emergency Room Pain 6 Severely limiting. Requires emergency care and should not be seen or managed at an outpatient pain management facility. Communication becomes difficult and requires great effort. Assistance to reach the emergency department may be required. Facial flushing and profuse sweating along with potentially dangerous increases in heart rate and blood pressure will be evident.   Distressing pain 7 Self-care is very difficult. Assistance is required to transport, or use restroom.  Assistance to reach the emergency department will be required. Tasks requiring coordination, such as bathing and getting dressed become very difficult.   Disabling pain 8 Self-care is no longer possible. At this level, pain is disabling. The individual is unable to do even the most "basic" activities such as walking, eating, bathing, dressing, transferring to a bed, or toileting. Fine motor skills are lost. It is difficult to think clearly.   Incapacitating pain 9 Pain becomes incapacitating. Thought processing is no longer possible. Difficult to remember your own name. Control of movement and coordination are lost.   The worst pain imaginable 10 At this level, most patients pass out from pain. When this level is reached, collapse of the autonomic nervous system occurs, leading to a sudden drop in blood pressure and heart rate. This in turn results in a temporary and dramatic drop in blood flow to the brain, leading to a loss of consciousness. Fainting is one of the body's self defense mechanisms. Passing out puts the brain in a calmed state and causes it to shut down for a while, in order to begin the healing process.    Summary: 1. Refer to this scale when providing [place] with your pain level. 2. Be accurate and careful when reporting your pain level. This will help with your care. 3. Over-reporting your pain level will lead to loss of credibility. 4. Even a level of 1/10 means that there is pain and will be treated at our facility. 5. High, inaccurate reporting will be documented as "Symptom Exaggeration", leading to loss of credibility and suspicions of possible secondary gains such as obtaining more narcotics, or wanting to appear disabled, for fraudulent reasons. 6. Only pain levels of 5 or below will be seen at our facility. 7. Pain levels of 6 and above will be sent to the Emergency Department and the appointment  cancelled. _____________________________________________________________________________________________  Post-procedure Information What to expect: Most procedures involve the use of a local anesthetic (numbing medicine), and a steroid (anti-inflammatory medicine).  The local anesthetics may cause temporary numbness and weakness of the legs or arms, depending on the location of the block. This numbness/weakness may last 4-6 hours, depending on the local anesthetic used. In rare instances, it can last up to 24 hours. While numb, you must be very careful not to injure the extremity.  After any procedure, you could expect the pain to get better within 15-20 minutes. This relief is temporary and may last 4-6 hours. Once the local anesthetics wears off, you could experience discomfort, possibly more than usual, for up to 10 (ten) days. In the case of radiofrequencies, it may last up to 6 weeks. Surgeries may take up to 8 weeks for the healing process. The discomfort is due to the irritation caused by needles going through skin and muscle. To minimize the discomfort, we recommend using ice the first day, and heat from then on. The ice should be applied for 15 minutes on, and 15 minutes off. Keep repeating this cycle until bedtime. Avoid applying the ice directly to the skin, to prevent frostbite. Heat should be used daily, until the pain improves (4-10 days). Be careful not to burn yourself.  Occasionally you may experience muscle spasms or cramps. These occur as a consequence of the irritation caused by the needle sticks to the muscle and the blood that will inevitably be lost into the surrounding muscle tissue. Blood tends to be very irritating to tissues, which tend to react by going into spasm. These spasms may start the same day of your procedure, but they may also take days to develop. This late onset type of spasm or cramp is usually caused by electrolyte imbalances triggered by the steroids, at the  level of the kidney. Cramps and spasms tend to respond well to muscle relaxants, multivitamins (some are triggered by the procedure, but may have their origins in vitamin deficiencies), and "Gatorade", or any sports drinks that can replenish any electrolyte imbalances. (If you are a diabetic, ask your pharmacist to get you a sugar-free brand.) Warm showers or baths may also be helpful. Stretching exercises are highly recommended. General Instructions:  Be alert for signs of possible infection: redness, swelling, heat, red streaks, elevated temperature, and/or fever. These typically appear 4 to 6 days after the procedure. Immediately notify your doctor if you experience unusual bleeding, difficulty breathing, or loss of bowel or bladder control. If you experience increased pain, do not increase your pain medicine intake, unless instructed by your pain physician. Post-Procedure Care:  Be careful in moving about. Muscle spasms in the area of the injection may occur. Applying ice or heat to the area is often helpful. The incidence of spinal headaches after epidural injections ranges between 1.4% and 6%. If you develop a headache that does not seem to respond to conservative therapy, please let your physician know. This can be treated with an epidural blood patch.   Post-procedure numbness or redness is to be expected, however it should average 4 to 6 hours. If numbness and weakness of your extremities begins to develop 4 to 6 hours after your procedure, and is felt to be progressing and worsening, immediately contact your physician.   Diet:  If you experience nausea, do not eat until this sensation goes away. If you had a "Stellate Ganglion Block" for upper extremity "Reflex Sympathetic Dystrophy", do not eat or drink until your  hoarseness goes away. In any case, always start with liquids first and if you tolerate them well, then slowly progress to more solid foods. Activity:  For the first 4 to 6 hours after  the procedure, use caution in moving about as you may experience numbness and/or weakness. Use caution in cooking, using household electrical appliances, and climbing steps. If you need to reach your Doctor call our office: [phone] Monday-Thursday 8:00 am - 4:00 PM    Fridays: Closed     In case of an emergency: In case of emergency, call 911 or go to the nearest emergency room and have the physician there call us.  Interpretation of Procedure Every nerve block has two components: a diagnostic component, and a treatment component. Unrealistic expectations are the most common causes of "perceived failure".  In a perfect world, a single nerve block should be able to completely and permanently eliminate the pain. Sadly, the world is not perfect.  Most pain management nerve blocks are performed using local anesthetics and steroids. Steroids are responsible for any long-term benefit that you may experience. Their purpose is to decrease any chronic swelling that may exist in the area. Steroids begin to work immediately after being injected. However, most patients will not experience any benefits until 5 to 10 days after the injection, when the swelling has come down to the point where they can tell a difference. Steroids will only help if there is swelling to be treated. As such, they can assist with the diagnosis. If effective, they suggest an inflammatory component to the pain, and if ineffective, they rule out inflammation as the main cause or component of the problem. If the problem is one of mechanical compression, you will get no benefit from those steroids.   In the case of local anesthetics, they have a crucial role in the diagnosis of your condition. Most will begin to work within15 to 20 minutes after injection. The duration will depend on the type used (short- vs. Long-acting). It is of outmost importance that patients keep tract of their pain, after the procedure. To assist with this matter,  a "Post-procedure Pain Diary" is provided. Make sure to complete it and to bring it back to your follow-up appointment.  As long as the patient keeps accurate, detailed records of their symptoms after every procedure, and returns to have those interpreted, every procedure will provide [place] with invaluable information. Even a block that does not provide the patient with any relief, will always provide [place] with information about the mechanism and the origin of the pain. The only time a nerve block can be considered a waste of time is when patients do not keep track of the results, or do not keep their post-procedure appointment.  Reporting the results back to your physician The Pain Score  Pain is a subjective complaint. It cannot be seen, touched, or measured. We depend entirely on the patient's report of the pain in order to assess your condition and treatment. To evaluate the pain, we use a pain scale, where "0" means "No Pain", and a "10" is "the worst possible pain that you can even imagine" (i.e. something like been eaten alive by a shark or being torn apart by a lion).   You will frequently be asked to rate your pain. Please be as accurate, remember that medical decisions will be based on your responses. Please do not rate your pain above a 10. Doing so is actually interpreted as "symptom magnification" (exaggeration), as  well as lack of understanding with regards to the scale. To put this into perspective, when you tell us that your pain is at a 10 (ten), what you are saying is that there is nothing we can do to make this pain any worse. (Carefully think about that.)Post-procedure Information What to expect: Most procedures involve the use of a local anesthetic (numbing medicine), and a steroid (anti-inflammatory medicine).  The local anesthetics may cause temporary numbness and weakness of the legs or arms, depending on the location of the block. This numbness/weakness may last 4-6 hours, depending  on the local anesthetic used. In rare instances, it can last up to 24 hours. While numb, you must be very careful not to injure the extremity.  After any procedure, you could expect the pain to get better within 15-20 minutes. This relief is temporary and may last 4-6 hours. Once the local anesthetics wears off, you could experience discomfort, possibly more than usual, for up to 10 (ten) days. In the case of radiofrequencies, it may last up to 6 weeks. Surgeries may take up to 8 weeks for the healing process. The discomfort is due to the irritation caused by needles going through skin and muscle. To minimize the discomfort, we recommend using ice the first day, and heat from then on. The ice should be applied for 15 minutes on, and 15 minutes off. Keep repeating this cycle until bedtime. Avoid applying the ice directly to the skin, to prevent frostbite. Heat should be used daily, until the pain improves (4-10 days). Be careful not to burn yourself.  Occasionally you may experience muscle spasms or cramps. These occur as a consequence of the irritation caused by the needle sticks to the muscle and the blood that will inevitably be lost into the surrounding muscle tissue. Blood tends to be very irritating to tissues, which tend to react by going into spasm. These spasms may start the same day of your procedure, but they may also take days to develop. This late onset type of spasm or cramp is usually caused by electrolyte imbalances triggered by the steroids, at the level of the kidney. Cramps and spasms tend to respond well to muscle relaxants, multivitamins (some are triggered by the procedure, but may have their origins in vitamin deficiencies), and "Gatorade", or any sports drinks that can replenish any electrolyte imbalances. (If you are a diabetic, ask your pharmacist to get you a sugar-free brand.) Warm showers or baths may also be helpful. Stretching exercises are highly recommended. General Instructions:   Be alert for signs of possible infection: redness, swelling, heat, red streaks, elevated temperature, and/or fever. These typically appear 4 to 6 days after the procedure. Immediately notify your doctor if you experience unusual bleeding, difficulty breathing, or loss of bowel or bladder control. If you experience increased pain, do not increase your pain medicine intake, unless instructed by your pain physician. Post-Procedure Care:  Be careful in moving about. Muscle spasms in the area of the injection may occur. Applying ice or heat to the area is often helpful. The incidence of spinal headaches after epidural injections ranges between 1.4% and 6%. If you develop a headache that does not seem to respond to conservative therapy, please let your physician know. This can be treated with an epidural blood patch.   Post-procedure numbness or redness is to be expected, however it should average 4 to 6 hours. If numbness and weakness of your extremities begins to develop 4 to 6 hours after your  procedure, and is felt to be progressing and worsening, immediately contact your physician.   Diet:  If you experience nausea, do not eat until this sensation goes away. If you had a "Stellate Ganglion Block" for upper extremity "Reflex Sympathetic Dystrophy", do not eat or drink until your hoarseness goes away. In any case, always start with liquids first and if you tolerate them well, then slowly progress to more solid foods. Activity:  For the first 4 to 6 hours after the procedure, use caution in moving about as you may experience numbness and/or weakness. Use caution in cooking, using household electrical appliances, and climbing steps. If you need to reach your Doctor call our office: [phone] Monday-Thursday 8:00 am - 4:00 PM    Fridays: Closed     In case of an emergency: In case of emergency, call 911 or go to the nearest emergency room and have the physician there call us.  Interpretation of  Procedure Every nerve block has two components: a diagnostic component, and a treatment component. Unrealistic expectations are the most common causes of "perceived failure".  In a perfect world, a single nerve block should be able to completely and permanently eliminate the pain. Sadly, the world is not perfect.  Most pain management nerve blocks are performed using local anesthetics and steroids. Steroids are responsible for any long-term benefit that you may experience. Their purpose is to decrease any chronic swelling that may exist in the area. Steroids begin to work immediately after being injected. However, most patients will not experience any benefits until 5 to 10 days after the injection, when the swelling has come down to the point where they can tell a difference. Steroids will only help if there is swelling to be treated. As such, they can assist with the diagnosis. If effective, they suggest an inflammatory component to the pain, and if ineffective, they rule out inflammation as the main cause or component of the problem. If the problem is one of mechanical compression, you will get no benefit from those steroids.   In the case of local anesthetics, they have a crucial role in the diagnosis of your condition. Most will begin to work within15 to 20 minutes after injection. The duration will depend on the type used (short- vs. Long-acting). It is of outmost importance that patients keep tract of their pain, after the procedure. To assist with this matter, a "Post-procedure Pain Diary" is provided. Make sure to complete it and to bring it back to your follow-up appointment.  As long as the patient keeps accurate, detailed records of their symptoms after every procedure, and returns to have those interpreted, every procedure will provide [place] with invaluable information. Even a block that does not provide the patient with any relief, will always provide [place] with information about the mechanism and the  origin of the pain. The only time a nerve block can be considered a waste of time is when patients do not keep track of the results, or do not keep their post-procedure appointment.  Reporting the results back to your physician The Pain Score  Pain is a subjective complaint. It cannot be seen, touched, or measured. We depend entirely on the patient's report of the pain in order to assess your condition and treatment. To evaluate the pain, we use a pain scale, where "0" means "No Pain", and a "10" is "the worst possible pain that you can even imagine" (i.e. something like been eaten alive by a shark or being  torn apart by a lion).   You will frequently be asked to rate your pain. Please be as accurate, remember that medical decisions will be based on your responses. Please do not rate your pain above a 10. Doing so is actually interpreted as "symptom magnification" (exaggeration), as well as lack of understanding with regards to the scale. To put this into perspective, when you tell us that your pain is at a 10 (ten), what you are saying is that there is nothing we can do to make this pain any worse. (Carefully think about that.)Trigger Point Injections Patient Information  Description: Trigger points are areas of muscle sensitive to touch which cause pain with movement, sometimes felt some distance from the site of palpation.  Usually the muscle containing these trigger points if felt as a tight band or knot.   The area of maximum tenderness or trigger point is identified, and after antiseptic preparation of the skin, a small needle is placed into this site.  Reproduction of the pain often occurs and numbing medicine (local anesthetic) is injected into the site, sometimes along with steroid preparation.  The entire block usually lasts less than 5 minutes.  Conditions which may be treated by trigger points:   Muscular pain and spasm  Nerve irritation  Preparation for the injection:  10. Do not  eat any solid food or dairy products within 8 hours of your appointment. 11. You may drink clear liquids up to 3 hours before appointment.  Clear liquids include water, black coffee, juice or soda.  No milk or cream please. 12. You may take your regular medications, including pain medications, with a sip of water before your appointment.  Diabetics should hold regular insulin ( if take separately) and take 1/2 normal NPH dose the morning of the procedure.  Carry some sugar containing items with you to your appointment. 13. A driver must accompany you and be prepared to drive you home after your procedure.  60. Bring all your current medications with you. 15. An IV may be inserted and sedation may be given at the discretion of the physician.  16. A blood pressure cuff, EKG, and other monitors will often be applied during the procedure.  Some patients may need to have extra oxygen administered for a short period. 90. You will be asked to provide medical information, including your allergies and medications, prior to the procedure.  We must know immediately if you are taking blood thinners (like Coumadin/Warfarin) or if you are allergic to IV iodine contrast (dye).  We must know if you could possibly be pregnant.  Possible side-effects:   Bleeding from needle site  Infection (rare, may require surgery)  Nerve injury (rare)  Numbness & tingling (temporary)  Punctured lung (if injection around chest)  Light-headedness (temporary)  Pain at injection site (several days)  Decreased blood pressure (rare, temporary)  Weakness in arm/leg (temporary)  Call if you experience:   Hive or difficulty breathing (go to the emergency room)  Inflammation or drainage at the injection site(s)  Please note:  Although the local anesthetic injected can often make your painful muscle feel good for several hours after the injection, the pain may return.  It takes 3-7 days for steroids to work.  You may  not notice any pain relief for at least one week.  If effective, we will often do a series of injections spaced 3-6 weeks apart to maximally decrease your pain.  If you have any questions please call [phone]  [facility]

## 2016-08-19 ENCOUNTER — Telehealth: Payer: Self-pay

## 2016-08-19 NOTE — Telephone Encounter (Signed)
Denies any needs at this time. "I'm fine" Instructed to call if needed

## 2016-09-14 ENCOUNTER — Ambulatory Visit: Payer: Medicare Other | Attending: Pain Medicine | Admitting: Pain Medicine

## 2016-09-14 ENCOUNTER — Encounter: Payer: Self-pay | Admitting: Pain Medicine

## 2016-09-14 VITALS — BP 154/56 | HR 79 | Temp 97.8°F | Resp 16 | Ht 62.0 in | Wt 130.0 lb

## 2016-09-14 DIAGNOSIS — Z8673 Personal history of transient ischemic attack (TIA), and cerebral infarction without residual deficits: Secondary | ICD-10-CM | POA: Diagnosis not present

## 2016-09-14 DIAGNOSIS — M545 Low back pain: Secondary | ICD-10-CM | POA: Insufficient documentation

## 2016-09-14 DIAGNOSIS — K219 Gastro-esophageal reflux disease without esophagitis: Secondary | ICD-10-CM | POA: Insufficient documentation

## 2016-09-14 DIAGNOSIS — Z7901 Long term (current) use of anticoagulants: Secondary | ICD-10-CM | POA: Insufficient documentation

## 2016-09-14 DIAGNOSIS — J449 Chronic obstructive pulmonary disease, unspecified: Secondary | ICD-10-CM | POA: Insufficient documentation

## 2016-09-14 DIAGNOSIS — E785 Hyperlipidemia, unspecified: Secondary | ICD-10-CM | POA: Insufficient documentation

## 2016-09-14 DIAGNOSIS — G8929 Other chronic pain: Secondary | ICD-10-CM

## 2016-09-14 DIAGNOSIS — Z7984 Long term (current) use of oral hypoglycemic drugs: Secondary | ICD-10-CM | POA: Insufficient documentation

## 2016-09-14 DIAGNOSIS — N183 Chronic kidney disease, stage 3 (moderate): Secondary | ICD-10-CM | POA: Insufficient documentation

## 2016-09-14 DIAGNOSIS — E1122 Type 2 diabetes mellitus with diabetic chronic kidney disease: Secondary | ICD-10-CM | POA: Insufficient documentation

## 2016-09-14 DIAGNOSIS — M25511 Pain in right shoulder: Secondary | ICD-10-CM

## 2016-09-14 DIAGNOSIS — M19012 Primary osteoarthritis, left shoulder: Secondary | ICD-10-CM | POA: Insufficient documentation

## 2016-09-14 DIAGNOSIS — G629 Polyneuropathy, unspecified: Secondary | ICD-10-CM | POA: Insufficient documentation

## 2016-09-14 DIAGNOSIS — M792 Neuralgia and neuritis, unspecified: Secondary | ICD-10-CM | POA: Diagnosis not present

## 2016-09-14 DIAGNOSIS — G890 Central pain syndrome: Secondary | ICD-10-CM

## 2016-09-14 DIAGNOSIS — M25512 Pain in left shoulder: Secondary | ICD-10-CM

## 2016-09-14 DIAGNOSIS — Z79891 Long term (current) use of opiate analgesic: Secondary | ICD-10-CM | POA: Diagnosis not present

## 2016-09-14 DIAGNOSIS — Z7902 Long term (current) use of antithrombotics/antiplatelets: Secondary | ICD-10-CM | POA: Insufficient documentation

## 2016-09-14 DIAGNOSIS — M48061 Spinal stenosis, lumbar region without neurogenic claudication: Secondary | ICD-10-CM | POA: Diagnosis not present

## 2016-09-14 DIAGNOSIS — M79601 Pain in right arm: Secondary | ICD-10-CM | POA: Insufficient documentation

## 2016-09-14 DIAGNOSIS — D509 Iron deficiency anemia, unspecified: Secondary | ICD-10-CM | POA: Insufficient documentation

## 2016-09-14 DIAGNOSIS — E559 Vitamin D deficiency, unspecified: Secondary | ICD-10-CM | POA: Diagnosis not present

## 2016-09-14 DIAGNOSIS — M79602 Pain in left arm: Secondary | ICD-10-CM | POA: Diagnosis present

## 2016-09-14 DIAGNOSIS — M19011 Primary osteoarthritis, right shoulder: Secondary | ICD-10-CM | POA: Insufficient documentation

## 2016-09-14 MED ORDER — GABAPENTIN 100 MG PO CAPS: 100.0000 mg | ORAL_CAPSULE | Freq: Every day | ORAL | 0 refills | Status: DC

## 2016-09-14 NOTE — Progress Notes (Signed)
Safety precautions to be maintained throughout the outpatient stay will include: orient to surroundings, keep bed in low position, maintain call bell within reach at all times, provide assistance with transfer out of bed and ambulation.  

## 2016-09-14 NOTE — Patient Instructions (Addendum)
____________________________________________________________________________________________  Kelli Gutierrez have Gabapentin at your pharmacy to be picked up today. Follow the titration schedule below.  If you have any questions call us for verification.   itial Gabapentin Titration  Medication used: Gabapentin (Generic Name) or Neurontin (Brand Name) 100 mg tablets/capsules  Reasons to stop increasing the dose:  Reason 1: You get good relief of symptoms, in which case there is no need to increase the daily dose any further.    Reason 2: You develop some side effects, such as sleeping all of the time, difficulty concentrating, or becoming disoriented, in which case you need to go down on the dose, to the prior level, where you were not experiencing any side effects. Stay on that dose longer, to allow more time for your body to get use it, before attempting to increase it again.   Steps: Step 1: Start by taking 1 (one) tablet at bedtime x 7 (seven) days.  Step 2: After being on 1 (one) tablet for 7 (seven) days, then increase it to 2 (two) tablets at bedtime for another 7 (seven) days.  Step 3: Next, after being on 2 (two) tablets at bedtime for 7 (seven) days, then increase it to 3 (three) tablets at bedtime, and stay on that dose until you see your doctor.  Reasons to stop increasing the dose: Reason 1: You get good relief of symptoms, in which case there is no need to increase the daily dose any further.  Reason 2: You develop some side effects, such as sleeping all of the time, difficulty concentrating, or becoming disoriented, in which case you need to go down on the dose, to the prior level, where you were not experiencing any side effects. Stay on that dose longer, to allow more time for your body to get use it, before attempting to increase it again.  Endpoint: Once you have reached the maximum dose you can tolerate without side-effects, contact your physician so as to evaluate the results of  the regimen.   Questions: Feel free to contact us for any questions or problems at [phone] ____________________________________________________________________________________________ ____________________________________________________________________________________________  Pain Scale  Introduction: The pain score used by this practice is the Verbal Numerical Rating Scale (VNRS-11). This is an 11-point scale. It is for adults and children 10 years or older. There are significant differences in how the pain score is reported, used, and applied. Forget everything you learned in the past and learn this scoring system.  General Information: The scale should reflect your current level of pain. Unless you are specifically asked for the level of your worst pain, or your average pain. If you are asked for one of these two, then it should be understood that it is over the past 24 hours.  Basic Activities of Daily Living (ADL): Personal hygiene, dressing, eating, transferring, and using restroom.  Instructions: Most patients tend to report their level of pain as a combination of two factors, their physical pain and their psychosocial pain. This last one is also known as "suffering" and it is reflection of how physical pain affects you socially and psychologically. From now on, report them separately. From this point on, when asked to report your pain level, report only your physical pain. Use the following table for reference.  Pain Clinic Pain Levels (0-5/10)  Pain Level Score  Description  No Pain 0   Mild pain 1 Nagging, annoying, but does not interfere with basic activities of daily living (ADL). Patients are able to eat, bathe, get  dressed, toileting (being able to get on and off the toilet and perform personal hygiene functions), transfer (move in and out of bed or a chair without assistance), and maintain continence (able to control bladder and bowel functions). Blood pressure and heart  rate are unaffected. A normal heart rate for a healthy adult ranges from 60 to 100 bpm (beats per minute).   Mild to moderate pain 2 Noticeable and distracting. Impossible to hide from other people. More frequent flare-ups. Still possible to adapt and function close to normal. It can be very annoying and may have occasional stronger flare-ups. With discipline, patients may get used to it and adapt.   Moderate pain 3 Interferes significantly with activities of daily living (ADL). It becomes difficult to feed, bathe, get dressed, get on and off the toilet or to perform personal hygiene functions. Difficult to get in and out of bed or a chair without assistance. Very distracting. With effort, it can be ignored when deeply involved in activities.   Moderately severe pain 4 Impossible to ignore for more than a few minutes. With effort, patients may still be able to manage work or participate in some social activities. Very difficult to concentrate. Signs of autonomic nervous system discharge are evident: dilated pupils (mydriasis); mild sweating (diaphoresis); sleep interference. Heart rate becomes elevated (>115 bpm). Diastolic blood pressure (lower number) rises above 100 mmHg. Patients find relief in laying down and not moving.   Severe pain 5 Intense and extremely unpleasant. Associated with frowning face and frequent crying. Pain overwhelms the senses.  Ability to do any activity or maintain social relationships becomes significantly limited. Conversation becomes difficult. Pacing back and forth is common, as getting into a comfortable position is nearly impossible. Pain wakes you up from deep sleep. Physical signs will be obvious: pupillary dilation; increased sweating; goosebumps; brisk reflexes; cold, clammy hands and feet; nausea, vomiting or dry heaves; loss of appetite; significant sleep disturbance with inability to fall asleep or to remain asleep. When persistent, significant weight loss is observed  due to the complete loss of appetite and sleep deprivation.  Blood pressure and heart rate becomes significantly elevated. Caution: If elevated blood pressure triggers a pounding headache associated with blurred vision, then the patient should immediately seek attention at an urgent or emergency care unit, as these may be signs of an impending stroke.    Emergency Department Pain Levels (6-10/10)  Emergency Room Pain 6 Severely limiting. Requires emergency care and should not be seen or managed at an outpatient pain management facility. Communication becomes difficult and requires great effort. Assistance to reach the emergency department may be required. Facial flushing and profuse sweating along with potentially dangerous increases in heart rate and blood pressure will be evident.   Distressing pain 7 Self-care is very difficult. Assistance is required to transport, or use restroom. Assistance to reach the emergency department will be required. Tasks requiring coordination, such as bathing and getting dressed become very difficult.   Disabling pain 8 Self-care is no longer possible. At this level, pain is disabling. The individual is unable to do even the most "basic" activities such as walking, eating, bathing, dressing, transferring to a bed, or toileting. Fine motor skills are lost. It is difficult to think clearly.   Incapacitating pain 9 Pain becomes incapacitating. Thought processing is no longer possible. Difficult to remember your own name. Control of movement and coordination are lost.   The worst pain imaginable 10 At this level, most patients pass out from  pain. When this level is reached, collapse of the autonomic nervous system occurs, leading to a sudden drop in blood pressure and heart rate. This in turn results in a temporary and dramatic drop in blood flow to the brain, leading to a loss of consciousness. Fainting is one of the body's self defense mechanisms. Passing out puts the  brain in a calmed state and causes it to shut down for a while, in order to begin the healing process.    Summary: 1. Refer to this scale when providing [place] with your pain level. 2. Be accurate and careful when reporting your pain level. This will help with your care. 3. Over-reporting your pain level will lead to loss of credibility. 4. Even a level of 1/10 means that there is pain and will be treated at our facility. 5. High, inaccurate reporting will be documented as "Symptom Exaggeration", leading to loss of credibility and suspicions of possible secondary gains such as obtaining more narcotics, or wanting to appear disabled, for fraudulent reasons. 6. Only pain levels of 5 or below will be seen at our facility. 7. Pain levels of 6 and above will be sent to the Emergency Department and the appointment cancelled. ____________________________________________________________________________________________

## 2016-09-14 NOTE — Progress Notes (Signed)
Tramadol 50 mg # 59 wasted in toilet witnessed by patient and Alinda Sierras RN

## 2016-09-14 NOTE — Progress Notes (Signed)
Patient's Name: Kelli Gutierrez  MRN: [id number]  Referring Provider: [facility] [place] [facility]  DOB: Apr 01, 1951  PCP: [facility], [place]  DOS: 09/14/2016  Note by: Kathlen Brunswick. Dossie Arbour, MD  Service setting: Ambulatory outpatient  Specialty: Interventional Pain Management  Location: [facility] (AMB) Pain Management Facility    Patient type: Established   Primary Reason(s) for Visit: Encounter for post-procedure evaluation of chronic illness with mild to moderate exacerbation CC: Arm Pain (bilateral)  HPI  Kelli Gutierrez is a 66 y.o. year old, female patient, who comes today for a post-procedure evaluation. 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, L3-4, and L4-5); Chronic pain syndrome; Long term current use of opiate analgesic; Long term prescription opiate use; Opiate use (10 MME/Day); Encounter for pain management planning; Encounter for therapeutic drug level monitoring; Current use of long term anticoagulation (Plavix); Hyperlipidemia; GERD (gastroesophageal reflux disease); History of thalamic infarction (Left); Stage III chronic kidney disease; Vitamin D deficiency; Chronic low back pain; 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 Her primarily concern today is the Arm Pain (bilateral)  Pain Assessment: Self-Reported Pain Score: 6 /10 Clinically the  patient looks like a 2/10 Reported level is inconsistent with clinical observations. Information on the proper use of the pain scale provided to the patient today Pain Type: Chronic pain Pain Location: Arm Pain Orientation: Right, Left Pain Descriptors / Indicators: Aching, Tingling Pain Frequency: Constant  [name] comes in today for post-procedure evaluation after the treatment done on 08/18/2016. The patient returns to the clinic today after having had a diagnostic bilateral suprascapular nerve block under fluoroscopic guidance, no sedation. To start with, she did not bring that postprocedure pain diary as we had instructed her to do. Second, her recount of the events is not compatible with the information that we collected immediately after the procedure. It is my impression, that the patient did not understand what she was supposed to document and in addition, she appears to have unrealistic expectations as she appears to be under the impression that her pain was going to completely go away and not come back after the first diagnostic injection. Today I attempted to explain all of the above to the patient that she appears to have already made her mind that she does not want to have any further injections. In addition, she has returned to tramadol indicating that it does not help her pain and she is requesting for stronger pain medication.  She indicates believing that the pain on the right side is completely unrelated to the joint and the fact it has something to do with her stroke. If this is the case, then opioids would not be the best choice for treatment of this condition since it may be a central pain syndrome. Today we will do a trial of memory stabilizers and see if this helps her pain. We have taken position and disposed of the tramadol.  Further details on both, my assessment(s), as well as the proposed treatment plan,  please see below.  Post-Procedure Assessment  08/18/2016 Procedure:  Diagnostic bilateral suprascapular nerve block under fluoroscopic guidance, no sedation. Pre-procedure pain score:  5/10 Post-procedure pain score: 0/10 (100% relief) Influential Factors: BMI: 23.78 kg/m Intra-procedural challenges: None observed Assessment challenges: Results reported today are inconsistent with those reported on procedure day, immediately before discharge. Previously the patient had reported 100% relief of the pain, before leaving the facility Post-procedural side-effects, adverse reactions, or complications: None reported Reported issues: None  Sedation: No sedation used. When no sedatives are used, the analgesic levels obtained are directly associated to the effectiveness of the local anesthetics. However, when sedation is provided, the level of analgesia obtained during the initial 1 hour following the intervention, is believed to be the result of a combination of factors. These factors may include, but are not limited to: 1. The effectiveness of the local anesthetics used. 2. The effects of the analgesic(s) and/or anxiolytic(s) used. 3. The degree of discomfort experienced by the patient at the time of the procedure. 4. The patients ability and reliability in recalling and recording the events. 5. The presence and influence of possible secondary gains and/or psychosocial factors. Reported result: Relief experienced during the 1st hour after the procedure: 25 % (Ultra-Short Term Relief) Interpretative annotation: Analgesia during this period is likely to be Local Anesthetic and/or IV Sedative (Analgesic/Anxiolitic) related. Patient does not appear to have understood instructions on differential evaluation of treated vs untreated area, leading to an inaccurate global report  Effects of local anesthetic: The analgesic effects attained during this period are directly associated to the localized infiltration of local anesthetics and therefore cary significant diagnostic value as  to the etiological location, or anatomical origin, of the pain. Expected duration of relief is directly dependent on the pharmacodynamics of the local anesthetic used. Long-acting (4-6 hours) anesthetics used.  Reported result: Relief during the next 4 to 6 hour after the procedure: 25 % (Short-Term Relief) Interpretative annotation: Partial relief would suggest incomplete involvement of injected area.          Long-term benefit: Defined as the period of time past the expected duration of local anesthetics. With the possible exception of prolonged sympathetic blockade from the local anesthetics, benefits during this period are typically attributed to, or associated with, other factors such as analgesic sensory neuropraxia, antiinflammatory effects, or beneficial biochemical changes provided by agents other than the local anesthetics Reported result: Extended relief following procedure: 25 % (lasting 2-3 days) (Long-Term Relief) Interpretative annotation: Partial relief. Possible incomplete therapeutic success.          Current benefits: Defined as persistent relief that continues at this point in time.   Reported results: Treated area: 0 %       Interpretative annotation: Recurrance of symptoms. This would suggest persistent aggravating factors  Interpretation: Results would suggest a successful diagnostic intervention.          Laboratory Chemistry  Inflammation Markers Lab Results  Component Value Date   CRP <0.8 03/23/2016   ESRSEDRATE 47 (H) 03/23/2016   (CRP: Acute Phase) (ESR: Chronic Phase) Renal Function Markers Lab Results  Component Value Date   BUN 27 (H) 03/23/2016   CREATININE 1.34 (H) 03/23/2016   GFRAA 47 (L) 03/23/2016   GFRNONAA 41 (L) 03/23/2016   Hepatic Function Markers Lab Results  Component Value Date   AST 20 03/23/2016   ALT 16 03/23/2016   ALBUMIN 3.9 03/23/2016   ALKPHOS 64 03/23/2016   Electrolytes Lab Results  Component Value Date  NA 137  03/23/2016   K 4.4 03/23/2016   CL 107 03/23/2016   CALCIUM 9.0 03/23/2016   MG 1.7 03/23/2016   Neuropathy Markers Lab Results  Component Value Date   VITAMINB12 1,816 (H) 03/23/2016   Bone Pathology Markers Lab Results  Component Value Date   ALKPHOS 64 03/23/2016   25OHVITD1 19 (L) 03/23/2016   25OHVITD2 <1.0 03/23/2016   25OHVITD3 19 03/23/2016   CALCIUM 9.0 03/23/2016   Coagulation Parameters Lab Results  Component Value Date   INR 0.93 12/18/2014   LABPROT 12.7 12/18/2014   APTT 30 12/17/2014   PLT 251 12/18/2014   Cardiovascular Markers Lab Results  Component Value Date   BNP 75 10/10/2012   HGB 13.1 12/18/2014   HCT 39.3 12/18/2014   Note: Lab results reviewed.  Recent Diagnostic Imaging Review  Dg C-arm 1-60 Min-no Report  Result Date: 08/18/2016 Fluoroscopy was utilized by the requesting physician.  No radiographic interpretation.   Note: Imaging results reviewed.          Meds  The patient has a current medication list which includes the following prescription(s): aspirin, atorvastatin, vitamin d3, clopidogrel, gabapentin, hydroxychloroquine, lisinopril, metformin, metoprolol tartrate, ranitidine, sitagliptin, and vitamin d (ergocalciferol).  Current Outpatient Prescriptions on File Prior to Visit  Medication Sig  . aspirin 81 MG chewable tablet Chew 81 mg by mouth daily.  Marland Kitchen atorvastatin (LIPITOR) 40 MG tablet Take 20 mg by mouth daily.   . Cholecalciferol (VITAMIN D3) 2000 units capsule Take 1 capsule (2,000 Units total) by mouth daily.  . clopidogrel (PLAVIX) 75 MG tablet Take 75 mg by mouth daily.  . hydroxychloroquine (PLAQUENIL) 200 MG tablet Take 200 mg by mouth daily.  Marland Kitchen lisinopril (PRINIVIL,ZESTRIL) 20 MG tablet Take 20 mg by mouth daily.  . metFORMIN (GLUCOPHAGE) 1000 MG tablet Take 1,000 mg by mouth 2 (two) times daily with a meal.  . metoprolol (LOPRESSOR) 50 MG tablet Take 50 mg by mouth 2 (two) times daily.  . ranitidine (ZANTAC) 150 MG  capsule Take 150 mg by mouth 2 (two) times daily.  . sitaGLIPtin (JANUVIA) 100 MG tablet Take 100 mg by mouth daily.  . Vitamin D, Ergocalciferol, (DRISDOL) 50000 units CAPS capsule Take 1 capsule (50,000 Units total) by mouth 2 (two) times a week x 6 weeks.   No current facility-administered medications on file prior to visit.    ROS  Constitutional: Denies any fever or chills Gastrointestinal: No reported hemesis, hematochezia, vomiting, or acute GI distress Musculoskeletal: Denies any acute onset joint swelling, redness, loss of ROM, or weakness Neurological: No reported episodes of acute onset apraxia, aphasia, dysarthria, agnosia, amnesia, paralysis, loss of coordination, or loss of consciousness  Allergies  Ms. Brownlee has No Known Allergies.  PFSH  Drug: Ms. Tawney  has no drug history on file. Alcohol:  reports that she does not drink alcohol. Tobacco:  reports that she has never smoked. She has never used smokeless tobacco. Medical:  has a past medical history of Acute CVA (cerebrovascular accident) ([place]) (12/17/2014); Anemia; Breast pain, right (06/01/2015); Chronic kidney disease (12/2015); CVA (cerebral infarction) (12/17/2014); Diabetes mellitus without complication ([place]); Hypertension; Lumbar radiculitis (12/06/2013); Rheumatoid arthritis ([place]); and Stroke ([place]). Family: family history is not on file.  Past Surgical History:  Procedure Laterality Date  . APPENDECTOMY    . CESAREAN SECTION     4  . CHOLECYSTECTOMY    . MIDDLE EAR SURGERY     Constitutional Exam  General appearance: Well nourished, well developed,  and well hydrated. In no apparent acute distress Vitals:   09/14/16 0948 09/14/16 0949  BP:  (!) 154/56  Pulse:  79  Resp:  16  Temp:  97.8 F (36.6 C)  SpO2:  99%  Weight: 130 lb (59 kg)   Height: _0  (1.575 m)    BMI Assessment: Estimated body mass index is 23.78 kg/m as calculated from the following:   Height as of this encounter: _1  (1.575 m).   Weight  as of this encounter: 130 lb (59 kg).  BMI interpretation table: BMI level Category Range association with higher incidence of chronic pain  <18 kg/m2 Underweight   18.5-24.9 kg/m2 Ideal body weight   25-29.9 kg/m2 Overweight Increased incidence by 20%  30-34.9 kg/m2 Obese (Class I) Increased incidence by 68%  35-39.9 kg/m2 Severe obesity (Class II) Increased incidence by 136%  >40 kg/m2 Extreme obesity (Class III) Increased incidence by 254%   BMI Readings from Last 4 Encounters:  09/14/16 23.78 kg/m  08/18/16 23.41 kg/m  07/29/16 23.78 kg/m  06/29/16 23.23 kg/m   Wt Readings from Last 4 Encounters:  09/14/16 130 lb (59 kg)  08/18/16 128 lb (58.1 kg)  07/29/16 130 lb (59 kg)  06/29/16 127 lb (57.6 kg)  Psych/Mental status: Alert, oriented x 3 (person, place, & time)       Eyes: PERLA Respiratory: No evidence of acute respiratory distress  Cervical Spine Exam  Inspection: No masses, redness, or swelling Alignment: Symmetrical Functional ROM: Unrestricted ROM      Stability: No instability detected Muscle strength & Tone: Functionally intact Sensory: Unimpaired Palpation: No palpable anomalies              Upper Extremity (UE) Exam    Side: Right upper extremity  Side: Left upper extremity  Inspection: No masses, redness, swelling, or asymmetry. No contractures  Inspection: No masses, redness, swelling, or asymmetry. No contractures  Functional ROM: Guarding for shoulder  Functional ROM: Guarding for shoulder  Muscle strength & Tone: Functionally intact  Muscle strength & Tone: Functionally intact  Sensory: Articular pain pattern  Sensory: Articular pain pattern  Palpation: No palpable anomalies              Palpation: No palpable anomalies              Specialized Test(s): Deferred         Specialized Test(s): Deferred          Thoracic Spine Exam  Inspection: No masses, redness, or swelling Alignment: Symmetrical Functional ROM: Unrestricted ROM Stability: No  instability detected Sensory: Unimpaired Muscle strength & Tone: No palpable anomalies  Lumbar Spine Exam  Inspection: No masses, redness, or swelling Alignment: Symmetrical Functional ROM: Unrestricted ROM      Stability: No instability detected Muscle strength & Tone: Functionally intact Sensory: Unimpaired Palpation: No palpable anomalies       Provocative Tests: Lumbar Hyperextension and rotation test: evaluation deferred today       Patrick's Maneuver: evaluation deferred today                    Gait & Posture Assessment  Ambulation: Unassisted Gait: Relatively normal for age and body habitus Posture: WNL   Lower Extremity Exam    Side: Right lower extremity  Side: Left lower extremity  Inspection: No masses, redness, swelling, or asymmetry. No contractures  Inspection: No masses, redness, swelling, or asymmetry. No contractures  Functional ROM: Unrestricted ROM  Functional ROM: Unrestricted ROM          Muscle strength & Tone: Functionally intact  Muscle strength & Tone: Functionally intact  Sensory: Unimpaired  Sensory: Unimpaired  Palpation: No palpable anomalies  Palpation: No palpable anomalies   Assessment  Primary Diagnosis & Pertinent Problem List: The primary encounter diagnosis was Chronic shoulder pain (Location of Primary Source of Pain) (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). 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  Status Diagnosis  Controlled Controlled Controlled 1. Chronic shoulder pain (Location of Primary Source of Pain) (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)   2. Neuropathic pain   3. 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 Plan of Care  Pharmacotherapy (Medications Ordered): Meds ordered this encounter  Medications  . gabapentin (NEURONTIN) 100 MG capsule    Sig: Take 1-2 capsules (100-200 mg total) by mouth at  bedtime.    Dispense:  60 capsule    Refill:  0    Do not place this medication, or any other prescription from our practice, on "Automatic Refill". Patient may have prescription filled one day early if pharmacy is closed on scheduled refill date.   New Prescriptions   GABAPENTIN (NEURONTIN) 100 MG CAPSULE    Take 1-2 capsules (100-200 mg total) by mouth at bedtime.   Medications administered today: Ms. Schuermann had no medications administered during this visit. Lab-work, procedure(s), and/or referral(s): No orders of the defined types were placed in this encounter.  Imaging and/or referral(s): None  Interventional therapies: Planned, scheduled, and/or pending:   Not at this time. (Stop plavix x 7-10 days prior to procedure)   Considering:   Diagnostic suprascapular nerve block without steroids #2 Possible bilateral suprascapular nerve radiofrequency ablation  Diagnostic left-sided cervical epidural steroid injection under fluoroscopic guidance and IV sedation  Diagnostic left cervical facet block under fluoroscopic guidance and IV sedation    Palliative PRN treatment(s):   None at this time.    Provider-requested follow-up: Return in about 1 month (around 10/14/2016) for Med-Mgmt, w/ MD.  Future Appointments Date Time Provider [place]  10/12/2016 1:00 PM Milinda Pointer, MD [facility] None   Primary Care Physician: [facility] [place] Location: [facility] Outpatient Pain Management Facility Note by: Kathlen Brunswick. Dossie Arbour, M.D, DABA, DABAPM, DABPM, DABIPP, FIPP Date: 09/14/2016; Time: 10:45 AM  Patient instructions provided during this appointment: Patient Instructions   ____________________________________________________________________________________________  You have Gabapentin at your pharmacy to be picked up today. Follow the titration schedule below.  If you have any questions call us for verification.   itial Gabapentin Titration  Medication  used: Gabapentin (Generic Name) or Neurontin (Brand Name) 100 mg tablets/capsules  Reasons to stop increasing the dose:  Reason 1: You get good relief of symptoms, in which case there is no need to increase the daily dose any further.    Reason 2: You develop some side effects, such as sleeping all of the time, difficulty concentrating, or becoming disoriented, in which case you need to go down on the dose, to the prior level, where you were not experiencing any side effects. Stay on that dose longer, to allow more time for your body to get use it, before attempting to increase it again.   Steps: Step 1: Start by taking 1 (one) tablet at bedtime x 7 (seven) days.  Step 2: After being on 1 (one) tablet for  7 (seven) days, then increase it to 2 (two) tablets at bedtime for another 7 (seven) days.  Step 3: Next, after being on 2 (two) tablets at bedtime for 7 (seven) days, then increase it to 3 (three) tablets at bedtime, and stay on that dose until you see your doctor.  Reasons to stop increasing the dose: Reason 1: You get good relief of symptoms, in which case there is no need to increase the daily dose any further.  Reason 2: You develop some side effects, such as sleeping all of the time, difficulty concentrating, or becoming disoriented, in which case you need to go down on the dose, to the prior level, where you were not experiencing any side effects. Stay on that dose longer, to allow more time for your body to get use it, before attempting to increase it again.  Endpoint: Once you have reached the maximum dose you can tolerate without side-effects, contact your physician so as to evaluate the results of the regimen.   Questions: Feel free to contact us for any questions or problems at [phone]  [phone] ____________________________________________________________________________________________ ____________________________________________________________________________________________  Pain Scale  Introduction: The pain score used by this practice is the Verbal Numerical Rating Scale (VNRS-11). This is an 11-point scale. It is for adults and children 10 years or older. There are significant differences in how the pain score is reported, used, and applied. Forget everything you learned in the past and learn this scoring system.  General Information: The scale should reflect your current level of pain. Unless you are specifically asked for the level of your worst pain, or your average pain. If you are asked for one of these two, then it should be understood that it is over the past 24 hours.  Basic Activities of Daily Living (ADL): Personal hygiene, dressing, eating, transferring, and using restroom.  Instructions: Most patients tend to report their level of pain as a combination of two factors, their physical pain and their psychosocial pain. This last one is also known as "suffering" and it is reflection of how physical pain affects you socially and psychologically. From now on, report them separately. From this point on, when asked to report your pain level, report only your physical pain. Use the following table for reference.  Pain Clinic Pain Levels (0-5/10)  Pain Level Score  Description  No Pain 0   Mild pain 1 Nagging, annoying, but does not interfere with basic activities of daily living (ADL). Patients are able to eat, bathe, get dressed, toileting (being able to get on and off the toilet and perform personal hygiene functions), transfer (move in and out of bed or a chair without assistance), and maintain continence (able to control bladder and bowel functions). Blood pressure and heart rate are unaffected. A normal heart rate for a healthy adult ranges from 60 to 100 bpm (beats  per minute).   Mild to moderate pain 2 Noticeable and distracting. Impossible to hide from other people. More frequent flare-ups. Still possible to adapt and function close to normal. It can be very annoying and may have occasional stronger flare-ups. With discipline, patients may get used to it and adapt.   Moderate pain 3 Interferes significantly with activities of daily living (ADL). It becomes difficult to feed, bathe, get dressed, get on and off the toilet or to perform personal hygiene functions. Difficult to get in and out of bed or a chair without assistance. Very distracting. With effort, it can be ignored when deeply involved in  activities.   Moderately severe pain 4 Impossible to ignore for more than a few minutes. With effort, patients may still be able to manage work or participate in some social activities. Very difficult to concentrate. Signs of autonomic nervous system discharge are evident: dilated pupils (mydriasis); mild sweating (diaphoresis); sleep interference. Heart rate becomes elevated (>115 bpm). Diastolic blood pressure (lower number) rises above 100 mmHg. Patients find relief in laying down and not moving.   Severe pain 5 Intense and extremely unpleasant. Associated with frowning face and frequent crying. Pain overwhelms the senses.  Ability to do any activity or maintain social relationships becomes significantly limited. Conversation becomes difficult. Pacing back and forth is common, as getting into a comfortable position is nearly impossible. Pain wakes you up from deep sleep. Physical signs will be obvious: pupillary dilation; increased sweating; goosebumps; brisk reflexes; cold, clammy hands and feet; nausea, vomiting or dry heaves; loss of appetite; significant sleep disturbance with inability to fall asleep or to remain asleep. When persistent, significant weight loss is observed due to the complete loss of appetite and sleep deprivation.  Blood pressure and heart rate  becomes significantly elevated. Caution: If elevated blood pressure triggers a pounding headache associated with blurred vision, then the patient should immediately seek attention at an urgent or emergency care unit, as these may be signs of an impending stroke.    Emergency Department Pain Levels (6-10/10)  Emergency Room Pain 6 Severely limiting. Requires emergency care and should not be seen or managed at an outpatient pain management facility. Communication becomes difficult and requires great effort. Assistance to reach the emergency department may be required. Facial flushing and profuse sweating along with potentially dangerous increases in heart rate and blood pressure will be evident.   Distressing pain 7 Self-care is very difficult. Assistance is required to transport, or use restroom. Assistance to reach the emergency department will be required. Tasks requiring coordination, such as bathing and getting dressed become very difficult.   Disabling pain 8 Self-care is no longer possible. At this level, pain is disabling. The individual is unable to do even the most "basic" activities such as walking, eating, bathing, dressing, transferring to a bed, or toileting. Fine motor skills are lost. It is difficult to think clearly.   Incapacitating pain 9 Pain becomes incapacitating. Thought processing is no longer possible. Difficult to remember your own name. Control of movement and coordination are lost.   The worst pain imaginable 10 At this level, most patients pass out from pain. When this level is reached, collapse of the autonomic nervous system occurs, leading to a sudden drop in blood pressure and heart rate. This in turn results in a temporary and dramatic drop in blood flow to the brain, leading to a loss of consciousness. Fainting is one of the body's self defense mechanisms. Passing out puts the brain in a calmed state and causes it to shut down for a while, in order to begin the healing  process.    Summary: 1. Refer to this scale when providing [place] with your pain level. 2. Be accurate and careful when reporting your pain level. This will help with your care. 3. Over-reporting your pain level will lead to loss of credibility. 4. Even a level of 1/10 means that there is pain and will be treated at our facility. 5. High, inaccurate reporting will be documented as "Symptom Exaggeration", leading to loss of credibility and suspicions of possible secondary gains such as obtaining more narcotics, or wanting  to appear disabled, for fraudulent reasons. 6. Only pain levels of 5 or below will be seen at our facility. 7. Pain levels of 6 and above will be sent to the Emergency Department and the appointment cancelled. ____________________________________________________________________________________________

## 2016-09-16 ENCOUNTER — Telehealth: Payer: Self-pay | Admitting: *Deleted

## 2016-09-19 IMAGING — MR MR HEAD WO/W CM
12 series · 48 of 48 positions shown · IV contrast (multihance)
Comparison: Head CT 12/17/2014 and previous

CLINICAL DATA: Right arm and leg tingling beginning 12/16/2014.
Facial tingling on the right. Speech disturbance. Symptoms worsening
today.

EXAM:
MRI HEAD WITHOUT AND WITH CONTRAST
TECHNIQUE: Multiplanar, multiecho pulse sequences of the brain and surrounding
structures were obtained without and with intravenous contrast.
CONTRAST:  14mL MULTIHANCE GADOBENATE DIMEGLUMINE 529 MG/ML IV SOLN

[Series 2: T1 · sagittal · 5.0mm · 0.45mm/px · 2 of 29 slices shown (1 of 2)]
[im 1/29]
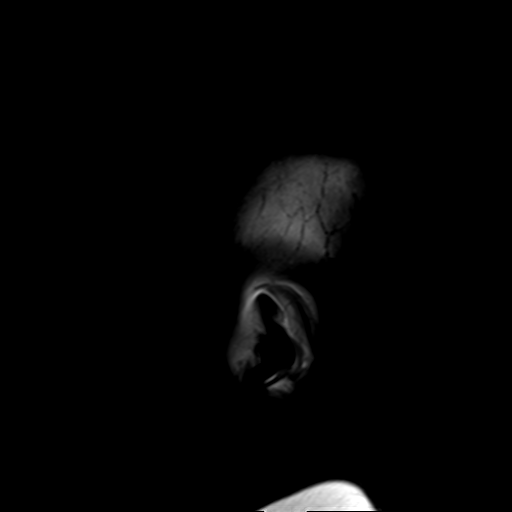
[im 29/29]
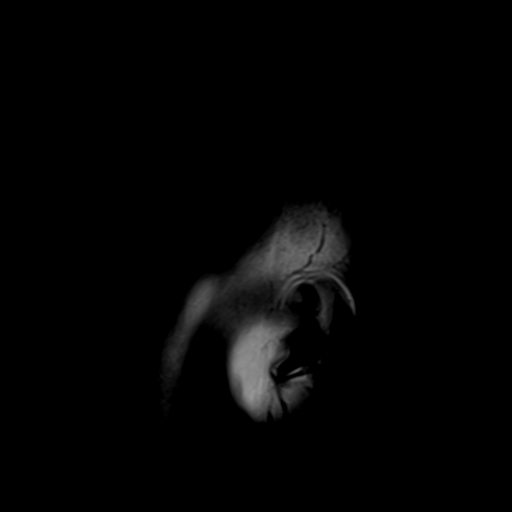

[Series 4: DWI · axial · 3.0mm · 1.80mm/px · z∈[-11,+145]mm · 5 of 55 slices shown (1 of 4)]
[im 1/55]
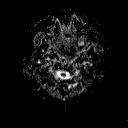
[im 14/55]
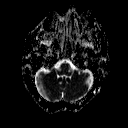
[im 28/55]
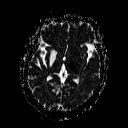
[im 41/55]
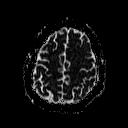
[im 55/55]
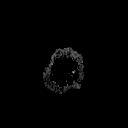

[Series 6: DWI · coronal · 3.0mm · 1.80mm/px · 5 of 48 slices shown (2 of 4)]
[im 1/48]
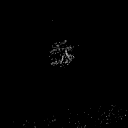
[im 12/48]
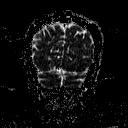
[im 24/48]
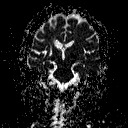
[im 36/48]
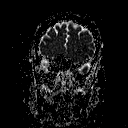
[im 48/48]
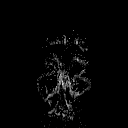

[Series 7: T2 · axial · 5.0mm · 0.60mm/px · z∈[-18,+139]mm · 3 of 26 slices shown (1 of 2)]
[im 1/26]
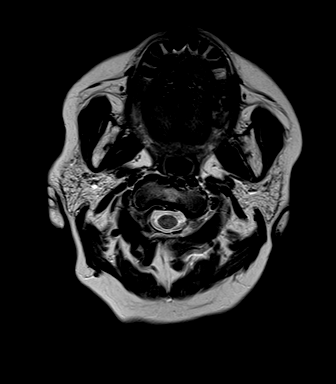
[im 13/26]
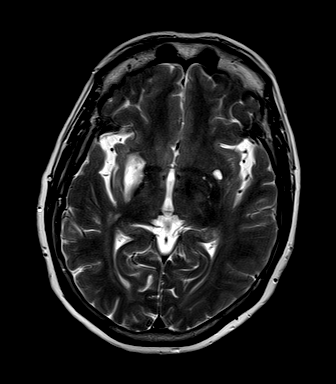
[im 26/26]
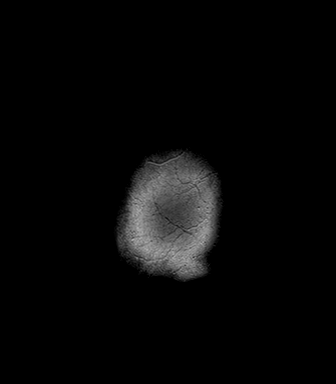

[Series 8: FLAIR · axial · 5.0mm · 0.45mm/px · z∈[-15,+142]mm · 3 of 26 slices shown]
[im 1/26]
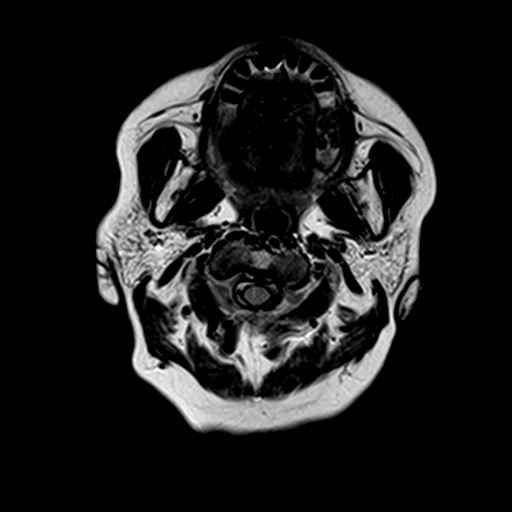
[im 13/26]
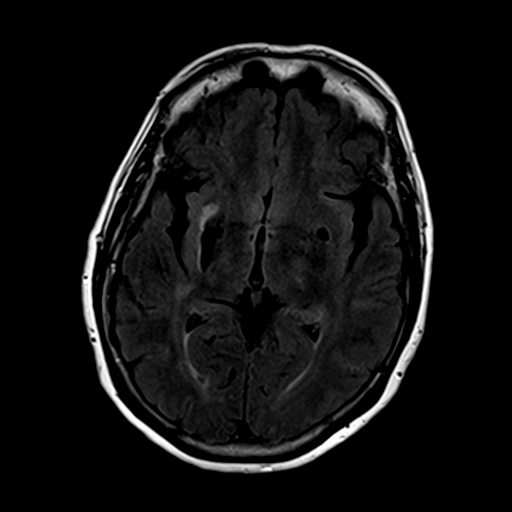
[im 26/26]
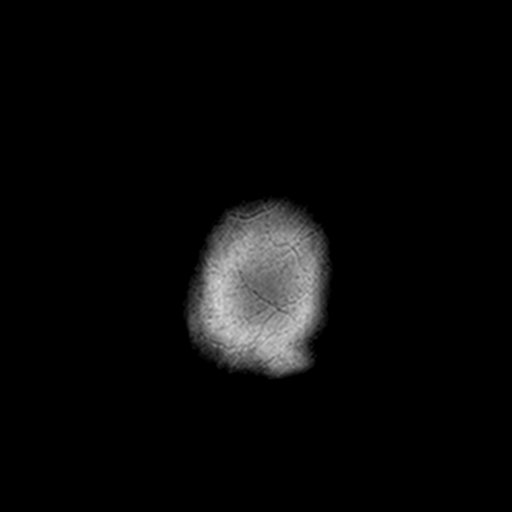

[Series 9: T2 · axial · 5.0mm · 0.45mm/px · z∈[-14,+137]mm · 2 of 25 slices shown (2 of 2)]
[im 1/25]
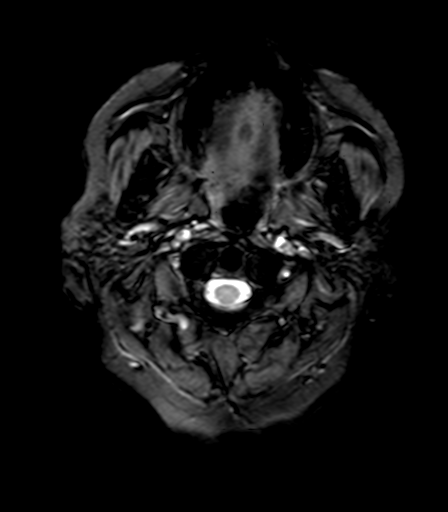
[im 25/25]
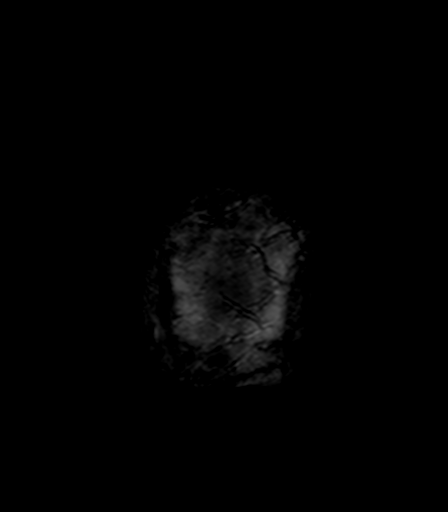

[Series 10: T1 · axial · 3.0mm · 1.00mm/px · z∈[-24,+138]mm · 6 of 56 slices shown (2 of 2)]
[im 1/56]
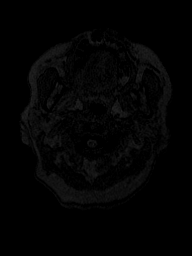
[im 12/56]
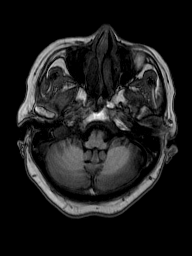
[im 23/56]
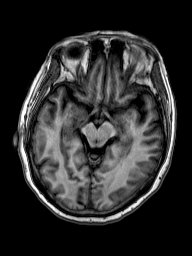
[im 34/56]
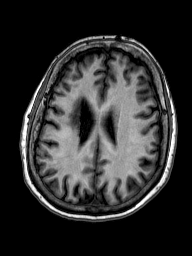
[im 45/56]
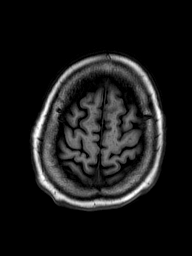
[im 56/56]
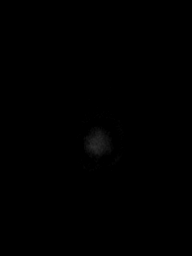

[Series 11: T2 post-contrast · coronal · 5.0mm · 0.49mm/px · 3 of 30 slices shown]
[im 1/30]
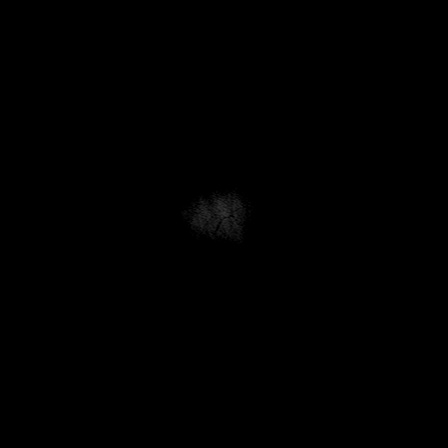
[im 15/30]
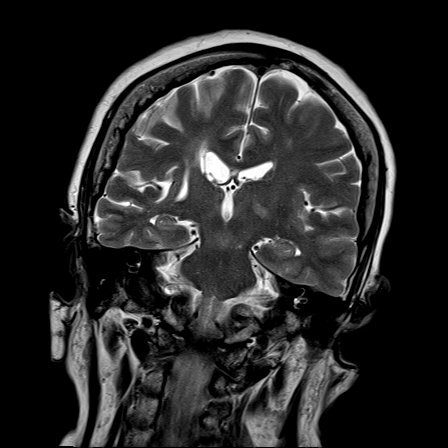
[im 30/30]
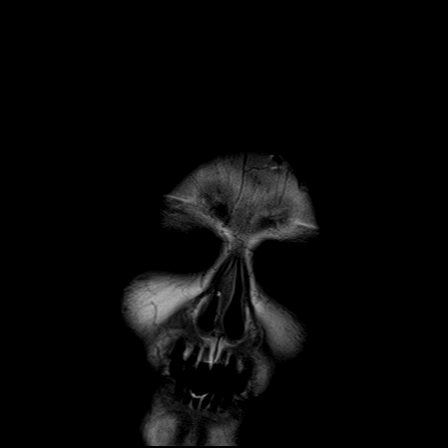

[Series 12: T1 post-contrast · axial · 3.0mm · 1.00mm/px · z∈[-19,+141]mm · 6 of 56 slices shown (1 of 2)]
[im 1/56]
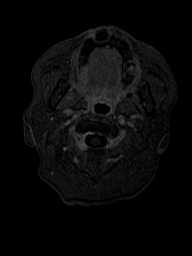
[im 12/56]
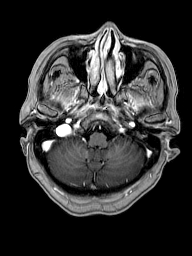
[im 23/56]
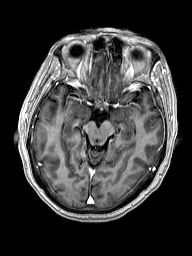
[im 34/56]
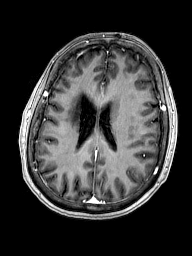
[im 45/56]
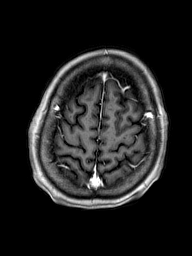
[im 56/56]
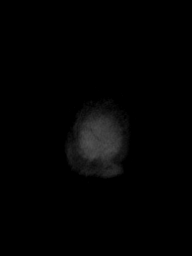

[Series 13: T1 post-contrast · coronal · 5.0mm · 0.43mm/px · 3 of 30 slices shown (2 of 2)]
[im 1/30]
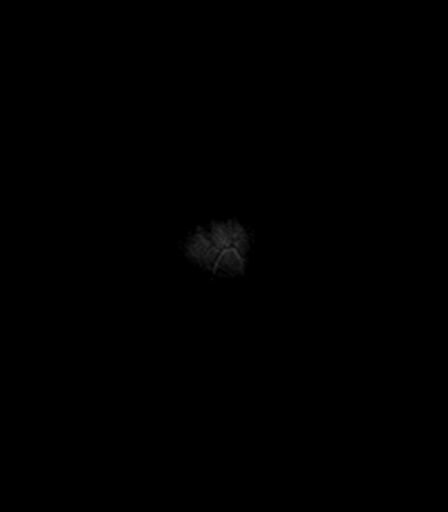
[im 15/30]
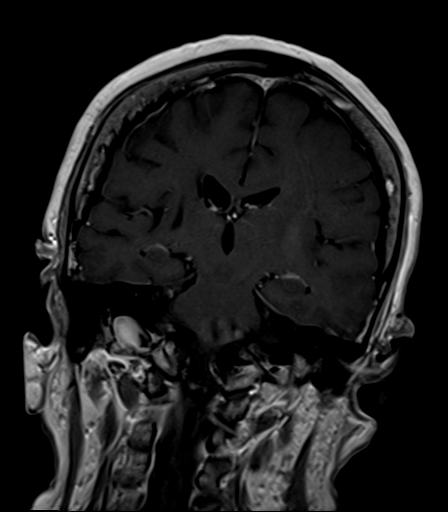
[im 30/30]
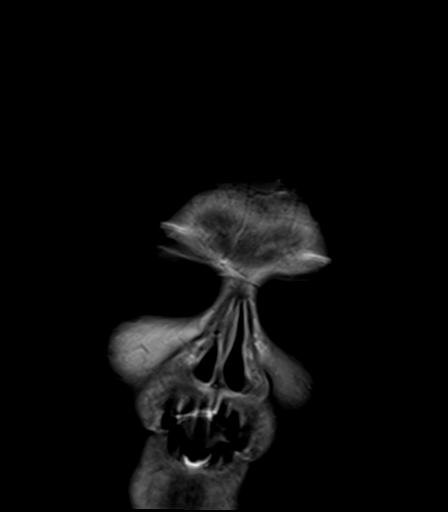

[Series 100: DWI · axial · 3.0mm · 1.80mm/px · z∈[-11,+145]mm · 5 of 55 slices shown (3 of 4)]
[im 1/55]
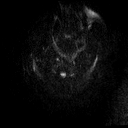
[im 14/55]
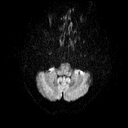
[im 28/55]
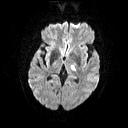
[im 41/55]
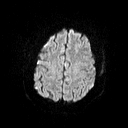
[im 55/55]
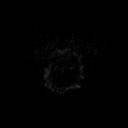

[Series 101: DWI · coronal · 3.0mm · 1.80mm/px · 5 of 46 slices shown (4 of 4)]
[im 1/46]
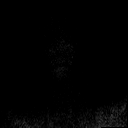
[im 12/46]
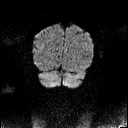
[im 23/46]
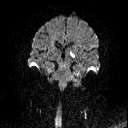
[im 34/46]
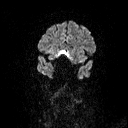
[im 46/46]
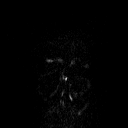

[48 of 48 positions shown; findings below may reference images not displayed]

FINDINGS: There is a 1 cm acute infarction affecting the lateral thalamus on
the left. No evidence of hemorrhage. No other acute infarction.

Brainstem and cerebellum are normal. Elsewhere in the cerebral
hemispheres, there is old infarction in the basal ganglia which has
progressed to atrophy and encephalomalacia. There are a few old
small vessel ischemic changes elsewhere in the cerebral white matter
bilaterally. There is a small old right posterior frontal cortical
and subcortical infarction. Some hemosiderin deposition is present
in the region of the old right basal ganglia infarction. No sign of
acute hemorrhage. No mass lesion, hydrocephalus or extra-axial
collection. No pituitary mass. No inflammatory sinus disease. No
abnormal contrast enhancement occurs.
IMPRESSION: 1 cm acute infarction within the lateral thalamus on the left.

Old right basal ganglia and right posterior frontal infarctions.

Mild small-vessel disease of the white matter.
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
## 2016-10-12 ENCOUNTER — Ambulatory Visit: Payer: No Typology Code available for payment source | Admitting: Pain Medicine

## 2017-01-24 ENCOUNTER — Other Ambulatory Visit: Payer: Self-pay | Admitting: Family Medicine

## 2017-01-24 DIAGNOSIS — E2839 Other primary ovarian failure: Secondary | ICD-10-CM

## 2017-06-30 ENCOUNTER — Encounter: Payer: Self-pay | Admitting: Emergency Medicine

## 2017-06-30 ENCOUNTER — Emergency Department: Payer: No Typology Code available for payment source

## 2017-06-30 ENCOUNTER — Emergency Department
Admission: EM | Admit: 2017-06-30 | Discharge: 2017-06-30 | Disposition: A | Discharge status: Home / Self Care | Payer: No Typology Code available for payment source | Attending: Emergency Medicine | Admitting: Emergency Medicine

## 2017-06-30 DIAGNOSIS — Z79899 Other long term (current) drug therapy: Secondary | ICD-10-CM | POA: Diagnosis not present

## 2017-06-30 DIAGNOSIS — E1122 Type 2 diabetes mellitus with diabetic chronic kidney disease: Secondary | ICD-10-CM | POA: Diagnosis not present

## 2017-06-30 DIAGNOSIS — Y9389 Activity, other specified: Secondary | ICD-10-CM | POA: Insufficient documentation

## 2017-06-30 DIAGNOSIS — Y999 Unspecified external cause status: Secondary | ICD-10-CM | POA: Insufficient documentation

## 2017-06-30 DIAGNOSIS — Z8673 Personal history of transient ischemic attack (TIA), and cerebral infarction without residual deficits: Secondary | ICD-10-CM | POA: Insufficient documentation

## 2017-06-30 DIAGNOSIS — N183 Chronic kidney disease, stage 3 (moderate): Secondary | ICD-10-CM | POA: Diagnosis not present

## 2017-06-30 DIAGNOSIS — Z7982 Long term (current) use of aspirin: Secondary | ICD-10-CM | POA: Diagnosis not present

## 2017-06-30 DIAGNOSIS — J449 Chronic obstructive pulmonary disease, unspecified: Secondary | ICD-10-CM | POA: Insufficient documentation

## 2017-06-30 DIAGNOSIS — S161XXA Strain of muscle, fascia and tendon at neck level, initial encounter: Secondary | ICD-10-CM | POA: Insufficient documentation

## 2017-06-30 DIAGNOSIS — S1980XA Other specified injuries of unspecified part of neck, initial encounter: Secondary | ICD-10-CM | POA: Diagnosis present

## 2017-06-30 DIAGNOSIS — Y92414 Local residential or business street as the place of occurrence of the external cause: Secondary | ICD-10-CM | POA: Insufficient documentation

## 2017-06-30 DIAGNOSIS — I129 Hypertensive chronic kidney disease with stage 1 through stage 4 chronic kidney disease, or unspecified chronic kidney disease: Secondary | ICD-10-CM | POA: Diagnosis not present

## 2017-06-30 DIAGNOSIS — Z7984 Long term (current) use of oral hypoglycemic drugs: Secondary | ICD-10-CM | POA: Insufficient documentation
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
## 2017-06-30 NOTE — ED Notes (Signed)
NAD noted at time of D/C. Pt denies questions or concerns. Pt ambulatory to the lobby at this time with her son.

## 2017-06-30 NOTE — Discharge Instructions (Addendum)
Your exam and x-rays show arthritis and degenerated disc, but there is no evidence of fracture or dislocation. Take OTC Tylenol as needed for pain relief. Follow-up with your provider or return as needed.

## 2017-08-01 ENCOUNTER — Other Ambulatory Visit: Payer: Self-pay | Admitting: Family Medicine

## 2017-08-01 DIAGNOSIS — Z1231 Encounter for screening mammogram for malignant neoplasm of breast: Secondary | ICD-10-CM

## 2017-08-10 ENCOUNTER — Ambulatory Visit: Payer: No Typology Code available for payment source | Attending: Registered Nurse | Admitting: Physical Therapy

## 2017-08-10 ENCOUNTER — Encounter: Payer: Self-pay | Admitting: Physical Therapy

## 2017-08-10 DIAGNOSIS — M25511 Pain in right shoulder: Secondary | ICD-10-CM | POA: Diagnosis present

## 2017-08-10 DIAGNOSIS — G8929 Other chronic pain: Secondary | ICD-10-CM | POA: Diagnosis present

## 2017-08-10 DIAGNOSIS — M25512 Pain in left shoulder: Secondary | ICD-10-CM | POA: Diagnosis present

## 2017-08-10 NOTE — Therapy (Addendum)
Past Medical History:  Diagnosis Date  . Acute CVA (cerebrovascular accident) ([place]) 12/17/2014  . Anemia   . Breast pain, right 06/01/2015  . Chronic kidney disease 12/2015  . CVA (cerebral infarction) 12/17/2014  . Diabetes mellitus without complication ([place])   . Hypertension   . Lumbar radiculitis 12/06/2013  . Rheumatoid arthritis ([place])   . Stroke [facility])     Past Surgical History:  Procedure Laterality Date  . APPENDECTOMY    . CESAREAN SECTION     4  . CHOLECYSTECTOMY    . MIDDLE EAR SURGERY      There were no vitals filed for this visit.    AROM Shoulder flex L: 50d R: 75d Shoulder ext L: 35d R: 38d Shoulder abd L unable without pain R: 68d Shoulder IR L: to reach L GT R: unable to reach LB or anywhere on the body Shoulder ER  unable to touch anywhere on the head bilat Cervical flex wnl Cervical ext wnl Cervical rotation L: 51d with pain at R UT area R: 40d with pain at L UT area Cervical lateral bending L 50% limited with pain at R UT R:   PROM Shoulder flex L: 81d R: 90d Shoulder ext: wnl bilat Shoulder abd L: 72d  R: 80d Shoulder IR L: 38d R: approx 45d Shoulder ER L: 60d R: approx 45d   Strength R shoulder gross 3+/5 Unable to truly test L MMT as pt does not have full AROM  Elbow flex: 3+/5 bilat with no pain Elbow ext: 3+/5 bilat with pain with L elbow ext at tricep insertion   Posture Patient sits/stands with bilat rounded shoulders, forward head, and increased thoracic kyphosis  Palpation: Patient reports verbal tenderness to palpation to L UT, levator, supraspinatus, and infraspinatus. Patient has verbal tenderness with withdrawal to prox tricep long head muscle belly and tendon. During PROM patient is very guarded and presents with bicep muscle belly tenderness and tightness                               PT Short Term Goals - 08/10/17 1047      PT SHORT TERM GOAL #1   Title  pt will be independent with HEP to promote  recovery and decrease reinjury.    Time  4    Period  Weeks    Status  New        PT Long Term Goals - 08/10/17 1046      PT LONG TERM GOAL #1   Title   Patient will increase FOTO score to 79 to demonstrate predicted increase in functional mobility to complete ADLs    Baseline  08/10/17: 18    Time  8    Period  Weeks    Status  New      PT LONG TERM GOAL #2   Title  Pt will not wake up at night due to pain    Baseline  08/10/17 Patient reports she cannot sleep through the night d/t pain    Time  6    Period  Weeks    Status  New      PT LONG TERM GOAL #3   Title  Pt will decrease quick DASH score by at least 8% in order to demonstrate clinically significant reduction in disability.    Baseline  08/10/17 84.1%    Time  8    Period  Weeks  Status  New      PT LONG TERM GOAL #4   Title  Patient will demonstrate full gross L shoulder AROM     Baseline  08/10/17: see eval    Time  8    Period  Weeks    Status  New              Patient will benefit from skilled therapeutic intervention in order to improve the following deficits and impairments:  Decreased activity tolerance, Decreased mobility, Decreased range of motion, Decreased strength, Hypomobility, Increased muscle spasms, Impaired flexibility, Postural dysfunction, Improper body mechanics, Pain, Decreased knowledge of precautions, Cardiopulmonary status limiting activity, Impaired tone, Impaired UE functional use  Visit Diagnosis: Chronic left shoulder pain - Plan: PT plan of care cert/re-cert     Problem List Patient Active Problem List   Diagnosis Date Noted  . Neuropathic pain 09/14/2016  . 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 Chronic low back pain 07/29/2016  . Lumbar lateral recess stenosis (L2-3, L3-4, L4-5, and L5-S1) 07/29/2016  . Lumbar foraminal  stenosis (Bilateral L4-5 and L5-S1) 07/29/2016  . History of whiplash injury to neck 07/29/2016  . Vitamin D deficiency 06/23/2016  . Stage III chronic kidney disease ([place]) 03/23/2016  . Diabetes mellitus type 2, uncomplicated ([place]) 70/17/7939  . Rheumatoid arthritis 03/18/2016  . Chronic pain syndrome 03/18/2016  . Long term current use of opiate analgesic 03/18/2016  . Long term prescription opiate use 03/18/2016  . Opiate use (10 MME/Day) 03/18/2016  . Encounter for pain management planning 03/18/2016  . Encounter for therapeutic drug level monitoring 03/18/2016  . Current use of long term anticoagulation (Plavix) 03/18/2016  . Hyperlipidemia 03/18/2016  . GERD (gastroesophageal reflux disease) 03/18/2016  . History of thalamic infarction (Left) 03/18/2016  . Chronic shoulder pain (Location of Primary Source of Pain) (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) 01/07/2016  . History of stroke 01/07/2016  . COPD, mild ([place]) 12/02/2015  . Essential hypertension with goal blood pressure less than 130/80 09/30/2015  . Chronic upper extremity numbness  (Right) 06/30/2015  . Iron deficiency anemia 06/18/2015  . History of CVA (cerebrovascular accident) 04/02/2015  . Lumbar central spinal stenosis (L2-3, L3-4, and L4-5) 12/06/2013   Kelli Gutierrez PT, DPT Kelli Gutierrez 08/14/2017, 8:49 PM  [facility] [place] [address], [place], [address] Phone: [phone]   Fax:  [phone]  Name: Kelli Gutierrez MRN: [id number] Date of Birth: 05/27/1950

## 2017-08-15 ENCOUNTER — Ambulatory Visit: Payer: No Typology Code available for payment source | Admitting: Physical Therapy

## 2017-08-15 ENCOUNTER — Encounter: Payer: Self-pay | Admitting: Physical Therapy

## 2017-08-15 DIAGNOSIS — G8929 Other chronic pain: Secondary | ICD-10-CM

## 2017-08-15 DIAGNOSIS — M25511 Pain in right shoulder: Secondary | ICD-10-CM

## 2017-08-15 DIAGNOSIS — M25512 Pain in left shoulder: Secondary | ICD-10-CM | POA: Diagnosis not present
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
## 2017-08-17 ENCOUNTER — Ambulatory Visit: Payer: No Typology Code available for payment source | Admitting: Physical Therapy

## 2017-08-17 ENCOUNTER — Encounter: Payer: Self-pay | Admitting: Physical Therapy

## 2017-08-17 DIAGNOSIS — M25512 Pain in left shoulder: Secondary | ICD-10-CM | POA: Diagnosis not present

## 2017-08-17 DIAGNOSIS — G8929 Other chronic pain: Secondary | ICD-10-CM
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
## 2017-08-19 ENCOUNTER — Ambulatory Visit
Admission: RE | Admit: 2017-08-19 | Discharge: 2017-08-19 | Disposition: A | Discharge status: Home / Self Care | Payer: Medicare Other | Source: Ambulatory Visit | Attending: Family Medicine | Admitting: Family Medicine

## 2017-08-19 DIAGNOSIS — Z1231 Encounter for screening mammogram for malignant neoplasm of breast: Secondary | ICD-10-CM | POA: Insufficient documentation

## 2017-08-22 ENCOUNTER — Encounter: Payer: Self-pay | Admitting: Physical Therapy

## 2017-08-22 ENCOUNTER — Ambulatory Visit: Payer: No Typology Code available for payment source | Admitting: Physical Therapy

## 2017-08-22 DIAGNOSIS — M25512 Pain in left shoulder: Principal | ICD-10-CM

## 2017-08-22 DIAGNOSIS — G8929 Other chronic pain: Secondary | ICD-10-CM
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
## 2017-08-24 ENCOUNTER — Encounter: Payer: Self-pay | Admitting: Physical Therapy

## 2017-08-24 ENCOUNTER — Ambulatory Visit: Payer: No Typology Code available for payment source | Admitting: Physical Therapy

## 2017-08-24 DIAGNOSIS — M25512 Pain in left shoulder: Secondary | ICD-10-CM | POA: Diagnosis not present

## 2017-08-24 DIAGNOSIS — G8929 Other chronic pain: Secondary | ICD-10-CM
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
## 2017-08-31 ENCOUNTER — Ambulatory Visit: Payer: No Typology Code available for payment source | Admitting: Physical Therapy

## 2017-08-31 ENCOUNTER — Encounter: Payer: Self-pay | Admitting: Physical Therapy

## 2017-08-31 DIAGNOSIS — M25512 Pain in left shoulder: Principal | ICD-10-CM

## 2017-08-31 DIAGNOSIS — G8929 Other chronic pain: Secondary | ICD-10-CM
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
## 2017-09-02 ENCOUNTER — Ambulatory Visit: Payer: No Typology Code available for payment source | Admitting: Physical Therapy

## 2017-09-02 ENCOUNTER — Encounter: Payer: Self-pay | Admitting: Physical Therapy

## 2017-09-02 DIAGNOSIS — G8929 Other chronic pain: Secondary | ICD-10-CM

## 2017-09-02 DIAGNOSIS — M25512 Pain in left shoulder: Principal | ICD-10-CM
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
## 2017-09-07 ENCOUNTER — Encounter: Payer: Self-pay | Admitting: Physical Therapy

## 2017-09-07 ENCOUNTER — Ambulatory Visit: Payer: No Typology Code available for payment source | Admitting: Physical Therapy

## 2017-09-07 DIAGNOSIS — G8929 Other chronic pain: Secondary | ICD-10-CM

## 2017-09-07 DIAGNOSIS — M25512 Pain in left shoulder: Principal | ICD-10-CM
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
## 2017-09-09 ENCOUNTER — Encounter: Payer: Self-pay | Admitting: Physical Therapy

## 2017-09-09 ENCOUNTER — Ambulatory Visit: Payer: No Typology Code available for payment source | Admitting: Physical Therapy

## 2017-09-09 DIAGNOSIS — M25512 Pain in left shoulder: Principal | ICD-10-CM

## 2017-09-09 DIAGNOSIS — G8929 Other chronic pain: Secondary | ICD-10-CM
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
## 2017-09-12 ENCOUNTER — Ambulatory Visit: Payer: Medicare Other | Attending: Registered Nurse | Admitting: Physical Therapy

## 2017-09-12 DIAGNOSIS — M25512 Pain in left shoulder: Secondary | ICD-10-CM | POA: Insufficient documentation

## 2017-09-12 DIAGNOSIS — G8929 Other chronic pain: Secondary | ICD-10-CM | POA: Insufficient documentation

## 2017-09-16 ENCOUNTER — Encounter: Payer: Self-pay | Admitting: Physical Therapy

## 2017-09-16 ENCOUNTER — Ambulatory Visit: Payer: Medicare Other | Admitting: Physical Therapy

## 2017-09-16 DIAGNOSIS — G8929 Other chronic pain: Secondary | ICD-10-CM | POA: Diagnosis present

## 2017-09-16 DIAGNOSIS — M25512 Pain in left shoulder: Secondary | ICD-10-CM | POA: Diagnosis present
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
## 2017-09-19 ENCOUNTER — Ambulatory Visit: Payer: Medicare Other | Admitting: Physical Therapy

## 2017-09-19 ENCOUNTER — Encounter: Payer: Self-pay | Admitting: Physical Therapy

## 2017-09-19 DIAGNOSIS — G8929 Other chronic pain: Secondary | ICD-10-CM

## 2017-09-19 DIAGNOSIS — M25512 Pain in left shoulder: Principal | ICD-10-CM
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
## 2017-09-21 ENCOUNTER — Encounter: Payer: Self-pay | Admitting: Physical Therapy

## 2017-09-21 ENCOUNTER — Ambulatory Visit: Payer: Medicare Other | Admitting: Physical Therapy

## 2017-09-21 DIAGNOSIS — M25512 Pain in left shoulder: Secondary | ICD-10-CM | POA: Diagnosis not present

## 2017-09-21 DIAGNOSIS — G8929 Other chronic pain: Secondary | ICD-10-CM
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
## 2017-09-26 ENCOUNTER — Ambulatory Visit: Payer: Medicare Other | Admitting: Physical Therapy

## 2017-09-26 ENCOUNTER — Encounter: Payer: Self-pay | Admitting: Physical Therapy

## 2017-09-26 DIAGNOSIS — M25512 Pain in left shoulder: Principal | ICD-10-CM

## 2017-09-26 DIAGNOSIS — G8929 Other chronic pain: Secondary | ICD-10-CM
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
## 2017-09-30 ENCOUNTER — Encounter: Payer: Self-pay | Admitting: Physical Therapy

## 2017-09-30 ENCOUNTER — Ambulatory Visit: Payer: Medicare Other | Admitting: Physical Therapy

## 2017-09-30 DIAGNOSIS — G8929 Other chronic pain: Secondary | ICD-10-CM

## 2017-09-30 DIAGNOSIS — M25512 Pain in left shoulder: Secondary | ICD-10-CM | POA: Diagnosis not present
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
## 2017-10-10 ENCOUNTER — Encounter: Payer: Medicare Other | Admitting: Physical Therapy

## 2017-10-17 ENCOUNTER — Encounter: Payer: Self-pay | Admitting: Physical Therapy

## 2017-10-17 ENCOUNTER — Ambulatory Visit: Payer: Medicare Other | Attending: Registered Nurse | Admitting: Physical Therapy

## 2017-10-17 DIAGNOSIS — G8929 Other chronic pain: Secondary | ICD-10-CM | POA: Diagnosis present

## 2017-10-17 DIAGNOSIS — M25512 Pain in left shoulder: Secondary | ICD-10-CM | POA: Insufficient documentation
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
## 2017-10-21 ENCOUNTER — Encounter: Payer: Self-pay | Admitting: Physical Therapy

## 2017-10-21 ENCOUNTER — Ambulatory Visit: Payer: Medicare Other | Admitting: Physical Therapy

## 2017-10-21 DIAGNOSIS — G8929 Other chronic pain: Secondary | ICD-10-CM

## 2017-10-21 DIAGNOSIS — M25512 Pain in left shoulder: Secondary | ICD-10-CM | POA: Diagnosis not present
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
## 2017-10-24 ENCOUNTER — Ambulatory Visit: Payer: Medicare Other | Admitting: Physical Therapy

## 2017-10-26 ENCOUNTER — Encounter: Payer: Self-pay | Admitting: Physical Therapy

## 2017-10-26 ENCOUNTER — Ambulatory Visit: Payer: Medicare Other | Admitting: Physical Therapy

## 2017-10-26 DIAGNOSIS — G8929 Other chronic pain: Secondary | ICD-10-CM

## 2017-10-26 DIAGNOSIS — M25512 Pain in left shoulder: Principal | ICD-10-CM
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
## 2017-10-28 ENCOUNTER — Encounter: Payer: Self-pay | Admitting: Physical Therapy

## 2017-10-28 ENCOUNTER — Ambulatory Visit: Payer: Medicare Other | Admitting: Physical Therapy

## 2017-10-28 DIAGNOSIS — M25512 Pain in left shoulder: Secondary | ICD-10-CM | POA: Diagnosis not present

## 2017-10-28 DIAGNOSIS — G8929 Other chronic pain: Secondary | ICD-10-CM
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
## 2017-11-01 ENCOUNTER — Ambulatory Visit: Payer: Medicare Other | Admitting: Physical Therapy

## 2017-11-01 ENCOUNTER — Encounter: Payer: Self-pay | Admitting: Physical Therapy

## 2017-11-01 DIAGNOSIS — M25512 Pain in left shoulder: Principal | ICD-10-CM

## 2017-11-01 DIAGNOSIS — G8929 Other chronic pain: Secondary | ICD-10-CM
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
## 2017-11-03 ENCOUNTER — Encounter: Payer: Self-pay | Admitting: Physical Therapy

## 2017-11-03 ENCOUNTER — Ambulatory Visit: Payer: Medicare Other | Admitting: Physical Therapy

## 2017-11-03 DIAGNOSIS — M25512 Pain in left shoulder: Principal | ICD-10-CM

## 2017-11-03 DIAGNOSIS — G8929 Other chronic pain: Secondary | ICD-10-CM
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
## 2017-11-07 ENCOUNTER — Ambulatory Visit: Payer: Medicare Other | Admitting: Physical Therapy

## 2017-11-11 ENCOUNTER — Encounter: Payer: Self-pay | Admitting: Physical Therapy

## 2017-11-11 ENCOUNTER — Ambulatory Visit: Payer: No Typology Code available for payment source | Attending: Registered Nurse | Admitting: Physical Therapy

## 2017-11-11 DIAGNOSIS — M25512 Pain in left shoulder: Secondary | ICD-10-CM | POA: Insufficient documentation

## 2017-11-11 DIAGNOSIS — G8929 Other chronic pain: Secondary | ICD-10-CM | POA: Insufficient documentation
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
## 2018-05-18 ENCOUNTER — Ambulatory Visit: Payer: Medicare Other | Admitting: Family Medicine

## 2018-09-11 ENCOUNTER — Ambulatory Visit: Payer: Medicare Other | Admitting: Family Medicine

## 2019-02-03 ENCOUNTER — Emergency Department: Payer: Medicare Other

## 2019-02-03 ENCOUNTER — Emergency Department
Admission: EM | Admit: 2019-02-03 | Discharge: 2019-02-03 | Disposition: A | Discharge status: Home / Self Care | Payer: Medicare Other | Attending: Emergency Medicine | Admitting: Emergency Medicine

## 2019-02-03 ENCOUNTER — Other Ambulatory Visit: Payer: Self-pay

## 2019-02-03 DIAGNOSIS — Z79899 Other long term (current) drug therapy: Secondary | ICD-10-CM | POA: Diagnosis not present

## 2019-02-03 DIAGNOSIS — N183 Chronic kidney disease, stage 3 unspecified: Secondary | ICD-10-CM | POA: Diagnosis not present

## 2019-02-03 DIAGNOSIS — Z7982 Long term (current) use of aspirin: Secondary | ICD-10-CM | POA: Insufficient documentation

## 2019-02-03 DIAGNOSIS — E1122 Type 2 diabetes mellitus with diabetic chronic kidney disease: Secondary | ICD-10-CM | POA: Diagnosis not present

## 2019-02-03 DIAGNOSIS — I129 Hypertensive chronic kidney disease with stage 1 through stage 4 chronic kidney disease, or unspecified chronic kidney disease: Secondary | ICD-10-CM | POA: Insufficient documentation

## 2019-02-03 DIAGNOSIS — M549 Dorsalgia, unspecified: Secondary | ICD-10-CM | POA: Insufficient documentation

## 2019-02-03 DIAGNOSIS — Z7902 Long term (current) use of antithrombotics/antiplatelets: Secondary | ICD-10-CM | POA: Insufficient documentation

## 2019-02-03 DIAGNOSIS — Z7984 Long term (current) use of oral hypoglycemic drugs: Secondary | ICD-10-CM | POA: Insufficient documentation

## 2019-02-03 DIAGNOSIS — Z8673 Personal history of transient ischemic attack (TIA), and cerebral infarction without residual deficits: Secondary | ICD-10-CM | POA: Diagnosis not present

## 2019-02-03 DIAGNOSIS — R1032 Left lower quadrant pain: Secondary | ICD-10-CM | POA: Diagnosis present

## 2019-02-03 LAB — URINALYSIS, COMPLETE (UACMP) WITH MICROSCOPIC
Bacteria, UA: NONE SEEN
Bilirubin Urine: NEGATIVE
Glucose, UA: 150 mg/dL — AB
Hgb urine dipstick: NEGATIVE
Ketones, ur: NEGATIVE mg/dL
Leukocytes,Ua: NEGATIVE
Nitrite: NEGATIVE
Protein, ur: NEGATIVE mg/dL
Specific Gravity, Urine: 1.015 (ref 1.005–1.030)
pH: 5 (ref 5.0–8.0)

## 2019-02-03 LAB — BASIC METABOLIC PANEL
Anion gap: 8 (ref 5–15)
BUN: 61 mg/dL — ABNORMAL HIGH (ref 8–23)
CO2: 22 mmol/L (ref 22–32)
Calcium: 9.7 mg/dL (ref 8.9–10.3)
Chloride: 105 mmol/L (ref 98–111)
Creatinine, Ser: 2.09 mg/dL — ABNORMAL HIGH (ref 0.44–1.00)
GFR calc Af Amer: 27 mL/min — ABNORMAL LOW (ref >60)
GFR calc non Af Amer: 24 mL/min — ABNORMAL LOW (ref >60)
Glucose, Bld: 186 mg/dL — ABNORMAL HIGH (ref 70–99)
Potassium: 5.7 mmol/L — ABNORMAL HIGH (ref 3.5–5.1)
Sodium: 135 mmol/L (ref 135–145)

## 2019-02-03 LAB — CBC
HCT: 41 % (ref 36.0–46.0)
Hemoglobin: 13.1 g/dL (ref 12.0–15.0)
MCH: 28.6 pg (ref 26.0–34.0)
MCHC: 32 g/dL (ref 30.0–36.0)
MCV: 89.5 fL (ref 80.0–100.0)
Platelets: 280 10*3/uL (ref 150–400)
RBC: 4.58 MIL/uL (ref 3.87–5.11)
RDW: 12.5 % (ref 11.5–15.5)
WBC: 7.6 10*3/uL (ref 4.0–10.5)
nRBC: 0 % (ref 0.0–0.2)

## 2019-02-03 MED ORDER — TRAMADOL HCL 50 MG PO TABS: 50.0000 mg | ORAL_TABLET | Freq: Four times a day (QID) | ORAL | 0 refills | Status: DC | PRN
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
## 2019-02-03 NOTE — ED Notes (Signed)
RN KIM notified of patient placed in room.

## 2019-02-03 NOTE — ED Notes (Signed)
Pt verbalized understanding of discharge instructions. NAD at this time. 

## 2019-02-03 NOTE — ED Triage Notes (Signed)
Pt arrived via POV with reports of nausea, left flank pain and left lower abdominal pain x 2 weeks.  Pt reports she has hx of kidney stones.  Pt denies any fevers, states she has not had an appetite and not eating much.

## 2019-02-12 ENCOUNTER — Ambulatory Visit: Payer: Medicare Other | Admitting: Family Medicine

## 2019-05-02 ENCOUNTER — Ambulatory Visit: Payer: Medicare Other | Attending: Family Medicine | Admitting: Physical Therapy

## 2019-05-02 ENCOUNTER — Other Ambulatory Visit: Payer: Self-pay

## 2019-05-02 ENCOUNTER — Encounter: Payer: Self-pay | Admitting: Physical Therapy

## 2019-05-02 DIAGNOSIS — G8929 Other chronic pain: Secondary | ICD-10-CM | POA: Insufficient documentation

## 2019-05-02 DIAGNOSIS — M545 Low back pain, unspecified: Secondary | ICD-10-CM

## 2019-05-02 DIAGNOSIS — M25512 Pain in left shoulder: Secondary | ICD-10-CM | POA: Diagnosis present

## 2019-05-02 DIAGNOSIS — M25511 Pain in right shoulder: Secondary | ICD-10-CM | POA: Diagnosis present

## 2019-05-02 NOTE — Therapy (Addendum)
[facility] [address] [place], [address] Phone: [phone]   Fax:  [phone]  Physical Therapy Evaluation  Patient Details  Name: Kelli Gutierrez MRN: [id number] Date of Birth: Mar 08, 1951 No data recorded  Encounter Date: 05/02/2019  PT End of Session - 05/02/19 1620    Visit Number  1    Number of Visits  17    Date for PT Re-Evaluation  06/27/19    PT Start Time  0900    PT Stop Time  1000    PT Time Calculation (min)  60 min    Activity Tolerance  Patient tolerated treatment well    Behavior During Therapy  [facility] for tasks assessed/performed       Past Medical History:  Diagnosis Date  . Acute CVA (cerebrovascular accident) ([place]) 12/17/2014  . Anemia   . Breast pain, right 06/01/2015  . Chronic kidney disease 12/2015  . CVA (cerebral infarction) 12/17/2014  . Diabetes mellitus without complication ([place])   . Hypertension   . Lumbar radiculitis 12/06/2013  . Rheumatoid arthritis ([place])   . Stroke [facility])     Past Surgical History:  Procedure Laterality Date  . APPENDECTOMY    . CESAREAN SECTION     4  . CHOLECYSTECTOMY    . MIDDLE EAR SURGERY      There were no vitals filed for this visit.            Objective measurements completed on examination: See above findings.     Lumbar Eval   Scour - neg bilat ASLR - neg bilat  Posture/Gait Observation:  Forward head posture, rounded shoulders, decreased lordosis, hip flexion in standing  Functional Tests Functional Tests  5 time sit to stand - if MMTs are lacking: 13 sec no pain painful  Trendelenburg  Balance: SL stance: R leg 1 sec, L leg 2 sec  Squat: can only squat a few inches, forward trunk lean    ROM  Lumbar AROM RIGHT LEFT  Flexion  WNL  Extension Slightly limited  Repeated Extension n/a  Lateral Flexion Painful  A little painful   Rotation WNL WNL   Hip AROM, PROM RIGHT LEFT  Flexion   90 with tone from stroke -  painful in back   ~120  IR WNL  WNL   ER WNL  WNL   ABD - - - -  ADD - - - -  Extension ~10 WNL ~10 WNL    MMT Trunk    Flexion  2/5     Extension 2/5       Hip RIGHT LEFT  Flexion   Knee flexion 4-/5 bilat Kee ext 4-/5 3/5 3/5  IR 2/5 2/5  ER 2/5 2/5  ABD 3/5 3+/5  ADD 3/5 3/5  Extension 3-/5 3-/5   Muscle Length Testing  RIGHT LEFT  HS Length 90 90  Thomas Test (+) painful iliopsoas (+)   Hip Adductor Assessment - -   PAM/  Level  PAIVM         CPA         UPA Hypomobile   PVIM    Palpation Lumbar L1-L4 CPA hypombile and painful Tender to palpation in piriformis, tension in bilat paraspinals Increased tone on R side from stroke  Special Test SLR - passive with DF and IR: neg - did not sensitize   Stair Analysis Stair step through on way up and descending step to on  way down R leading some forward trunk lean and holding on with R side (preferred) good control   Gait Analysis Forward trunk flexion, decreased stride length, decreased trunk motion  Transfers Modified independent from sitting to supine Mod assist supine to sit     Ther-Ex Lower trunk rotations x20 3-5sec holds each direction SKTC 30sec each side Education on findings and need for increased core and hip strength to prevent LBP for stabilization and for ease of transfers to decrease independence. Education on spine length tension relationship and need to decrease tension of taut musculature and increase strength/activation of lengthened musculature involved in lower crossed syndrome with good carry over           PT Education - 05/02/19 1617    Education provided  Yes    Education Details  HEP, weakness in hip contributing to pain in lumbar spine, lack of movement during day may contribute to pain    Person(s) Educated  Patient    Methods  Explanation;Verbal cues;Tactile cues    Comprehension  Verbalized understanding       PT Short Term Goals - 05/02/19 1638      PT SHORT  TERM GOAL #1   Title  In 4 weeks, pt will increase standing tolerance to 10 minutes without pain in LB in order to improve tolerance for ADL.    Baseline  05/02/2019: pt reports immediate pain when standing    Time  4    Period  Weeks    Status  New    Target Date  05/30/19      PT SHORT TERM GOAL #2   Title  Pt will be independent with HEP in order to improve strength and balance in order to decrease fall risk and improve function at home and work.    Baseline  05/02/19 HEP given    Time  4    Period  Weeks    Status  New        PT Long Term Goals - 05/02/19 1627      PT LONG TERM GOAL #1   Title  In 8 weeks, pt will increase tolerance to standing without pain for 15 minutes at a time in order to provide care for son and perform ADLs.    Baseline  05/02/2019 - immediate onset of pain in standing    Time  8    Period  Weeks    Status  New    Target Date  06/27/19      PT LONG TERM GOAL #2   Title  In 8 weeks, pt will perform transfers from supine to sitting and sitting to supine with an independent controlled motor pattern.    Baseline  05/02/19; modified independent, with subjective report of minA needed occassionally    Time  8    Period  Weeks    Status  New    Target Date  06/27/19      PT LONG TERM GOAL #3   Title  Patient will increase FOTO score to 61 to demonstrate predicted increase in functional mobility to complete ADLs    Baseline  05/02/19 42    Time  8    Period  Weeks    Status  New    Target Date  06/27/19      PT LONG TERM GOAL #4   Title  Pt will improve gross motor strength of bilat hips by 1 point in order to improve tolerance for activity.  Baseline  hip flexion 3/5 bilat, IR: 2/5, ER 2/5, ABD (R 3/5, L 3+/5), ADD (3/5), EXT (3-/5)    Time  8    Period  Weeks    Status  New    Target Date  06/27/19             Plan - 05/02/19 1621    Clinical Impression Statement  Pt is 69 yo female presenting with subacute LBP of insidious onset. Pt  demonstrates impairments in hip strength (FLEX, EXT, IR, ER, ADD, ABD), abdominal activation/strength, trunk extensor weakness, increased R-sided tone, decreased hip flexor muscle length, hypomobility in CPAs, and tenderness in piriformis and paraspinals. Pt activity is limited in ability to transfer (seated to supine, supine to seated, side lying to prone), decreased tolerance for standing/sitting/walking for extended periods of time, inability to bend over, impaired balance. Participation limitations include inability to stand for long periods of time to perform ADLs (cooking, cleaning), reduced capacity to provide care for her son, reduced ability to perform transfers for bed mobility, and pain limiting desire to move in general. Pt seems optimistic that PT will help with her LBP. Pt is a good candidate for PT and has fair rehabilitation potential.    Personal Factors and Comorbidities  Comorbidity 3+;Age;Time since onset of injury/illness/exacerbation;Fitness;Social Background    Comorbidities  HTN, diabetes, chronic kidney disease, stroke    Examination-Activity Limitations  Bend;Caring for Others;Sleep;Lift;Sit;Stand;Stairs;Carry;Locomotion Level;Squat    Examination-Participation Restrictions  Cleaning;Community Activity;Meal Prep;Other    Stability/Clinical Decision Making  Evolving/Moderate complexity    Clinical Decision Making  Moderate    Rehab Potential  Fair    Clinical Impairments Affecting Rehab Potential  (-) age, potential difficulty with motivation; prior strokes, chronicity of pain, severity of pain    PT Frequency  2x / week    PT Duration  8 weeks    PT Treatment/Interventions  ADLs/Self Care Home Management;Electrical Stimulation;Moist Heat;Functional mobility training;Therapeutic activities;Therapeutic exercise;Neuromuscular re-education;Patient/family education;Manual techniques;Passive range of motion;Dry needling;Cryotherapy;Ultrasound;Taping;Gait training;Balance training     PT Next Visit Plan  increase tolerance for standing, stretching, increase tolerance for movement    PT Home Exercise Plan  supine windshield wipers (legs side to side x10 ea direction), supine leg pull to chest hold for 30 sec x2 (thomas test position)    Consulted and Agree with Plan of Care  Patient       Patient will benefit from skilled therapeutic intervention in order to improve the following deficits and impairments:  Decreased activity tolerance, Decreased endurance, Decreased range of motion, Decreased strength, Improper body mechanics, Pain, Decreased balance, Decreased coordination, Decreased mobility, Difficulty walking, Impaired flexibility, Impaired tone, Postural dysfunction  Visit Diagnosis: Right-sided low back pain without sciatica, unspecified chronicity     Problem List Patient Active Problem List   Diagnosis Date Noted  . Neuropathic pain 09/14/2016  . 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 Chronic low back pain 07/29/2016  . Lumbar lateral recess stenosis (L2-3, L3-4, L4-5, and L5-S1) 07/29/2016  . Lumbar foraminal stenosis (Bilateral L4-5 and L5-S1) 07/29/2016  . History of whiplash injury to neck 07/29/2016  . Vitamin D deficiency 06/23/2016  . Stage III chronic kidney disease 03/23/2016  . Diabetes mellitus type 2, uncomplicated ([place]) [id number]  . Rheumatoid arthritis 03/18/2016  . Chronic pain syndrome 03/18/2016  . Long term current use of opiate analgesic 03/18/2016  . Long  term prescription opiate use 03/18/2016  . Opiate use (10 MME/Day) 03/18/2016  . Encounter for pain management planning 03/18/2016  . Encounter for therapeutic drug level monitoring 03/18/2016  . Current use of long term anticoagulation (Plavix) 03/18/2016  . Hyperlipidemia 03/18/2016  . GERD (gastroesophageal reflux disease) 03/18/2016   . History of thalamic infarction (Left) 03/18/2016  . Chronic shoulder pain (Location of Primary Source of Pain) (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) 01/07/2016  . History of stroke 01/07/2016  . Complex regional pain syndrome i of left lower limb 12/02/2015  . Essential hypertension with goal blood pressure less than 130/80 09/30/2015  . Chronic upper extremity numbness  (Right) 06/30/2015  . Iron deficiency anemia 06/18/2015  . History of CVA (cerebrovascular accident) 04/02/2015  . 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 [place], [address] Phone: [phone]   Fax:  [phone]  Name: Kelli Gutierrez MRN: [id number] Date of Birth: [birth year]

## 2019-05-03 ENCOUNTER — Encounter: Payer: Self-pay | Admitting: Physical Therapy

## 2019-05-03 NOTE — Addendum Note (Signed)
Addended by: Shelton Silvas on: 05/03/2019 11:39 AM   Modules accepted: Orders

## 2019-05-04 ENCOUNTER — Encounter: Payer: Self-pay | Admitting: Physical Therapy

## 2019-05-04 ENCOUNTER — Ambulatory Visit: Payer: Medicare Other | Admitting: Physical Therapy

## 2019-05-04 ENCOUNTER — Other Ambulatory Visit: Payer: Self-pay

## 2019-05-04 DIAGNOSIS — M545 Low back pain, unspecified: Secondary | ICD-10-CM

## 2019-05-04 DIAGNOSIS — M25512 Pain in left shoulder: Secondary | ICD-10-CM

## 2019-05-04 DIAGNOSIS — M25511 Pain in right shoulder: Secondary | ICD-10-CM

## 2019-05-04 DIAGNOSIS — G8929 Other chronic pain: Secondary | ICD-10-CM

## 2019-05-04 NOTE — Therapy (Signed)
[facility] [facility] [address], [place], [address] Phone: [phone]   Fax:  [phone]  Physical Therapy Treatment  Patient Details  Name: Kelli Gutierrez MRN: [id number] Date of Birth: 04/28/1950 No data recorded  Encounter Date: 05/04/2019  PT End of Session - 05/04/19 0953    Visit Number  2    Number of Visits  17    Date for PT Re-Evaluation  06/27/19    PT Start Time  0945    PT Stop Time  1030    PT Time Calculation (min)  45 min    Activity Tolerance  Patient tolerated treatment well    Behavior During Therapy  [facility] for tasks assessed/performed       Past Medical History:  Diagnosis Date  . Acute CVA (cerebrovascular accident) ([place]) 12/17/2014  . Anemia   . Breast pain, right 06/01/2015  . Chronic kidney disease 12/2015  . CVA (cerebral infarction) 12/17/2014  . Diabetes mellitus without complication ([place])   . Hypertension   . Lumbar radiculitis 12/06/2013  . Rheumatoid arthritis ([place])   . Stroke [facility])     Past Surgical History:  Procedure Laterality Date  . APPENDECTOMY    . CESAREAN SECTION     4  . CHOLECYSTECTOMY    . MIDDLE EAR SURGERY      There were no vitals filed for this visit.  Subjective Assessment - 05/04/19 0950    Subjective  Patient reports her LB feels good today, but her post R hip is painful today. Reports this pain is 4/10 but can get worse if she walks for a long time. Patient reports compliance with her HEP.    Pertinent History  Pt is a 69 yo female with history of stroke, type 2 diabetes, HTN, high cholesterol, RA, and chronic kidney disease. Pt has c/c of LBP that started 2 months ago with insidious onset. Pt describes pain as achy that stays in LB, favoring the R side. At worst, pt rates pain at 7-8/10, at best and currently, pain is 3/10 that stays the same throughout the day. Recently, LBP has subsided, but pt does not report any changes to her routine to account for  decrease in pain. Aggravating factors for pain include bending, walking, sitting, and laying on R side with immediate onset. Pt does not report any alleviating factors. Pt states she stays at home and watches television most of the day. Pt lives in an apartment with her son who she provides care for due to his disability. Pt pain has sometimes interfered with ability to care for son. Pt denies changes in bowel/bladder, saddle paresthesia, unrelenting night pain, fever/night sweats, unexplained weight loss/gain, and numbness and tingling. Pt goal for PT is to reduce pain without the use of medications in order to perform ADLs efficiently.    Limitations  Sitting;Walking;House hold activities;Lifting;Standing    How long can you sit comfortably?  5 min    How long can you stand comfortably?  5 min    How long can you walk comfortably?  "not too far d/t SOB w/ COPD"    Diagnostic tests  MRI in hospital    Patient Stated Goals  decrease pain for ADLs    Pain Onset  More than a month ago    Pain Onset  More than a month ago          Ther-Ex Nustep L1 18mins seat setting 5, BUE 7 for  gentle lumbar rotation and low impact hip strengthening Lower trunk rotations x20 3-5sec holds each direction; cuing for full 3sec hold SKTC 30sec each side with pain on R glute with R SKTC Bridge 2x 10 with cuing initially for glute contraction with good carry over Mini squat from elevated mat table with TC to tap bottom to mat table 2x 10 with cuing for full hip ext with stand Standing hip abd 2x 10 with cuing for posture and lumbo-pelvic dissociation with good carry over  Manual STM with trigger point release to superior R glute fibers and over piriformis L3-S1 grade 2 CPA for decreased pain 4 bouts 30sec each bout; G3 for increased motion 4 bouts 30sec each bout long axis R hip distraction 3x 10sec glute stretch 5x 10sec hold                   PT Education - 05/04/19 0952    Education provided   Yes    Education Details  Therex form/technique    Person(s) Educated  Patient    Methods  Explanation;Demonstration;Tactile cues    Comprehension  Verbalized understanding;Returned demonstration;Verbal cues required;Tactile cues required       PT Short Term Goals - 05/02/19 1638      PT SHORT TERM GOAL #1   Title  In 4 weeks, pt will increase standing tolerance to 10 minutes without pain in LB in order to improve tolerance for ADL.    Baseline  05/02/2019: pt reports immediate pain when standing    Time  4    Period  Weeks    Status  New    Target Date  05/30/19      PT SHORT TERM GOAL #2   Title  Pt will be independent with HEP in order to improve strength and balance in order to decrease fall risk and improve function at home and work.    Baseline  05/02/19 HEP given    Time  4    Period  Weeks    Status  New        PT Long Term Goals - 05/02/19 1627      PT LONG TERM GOAL #1   Title  In 8 weeks, pt will increase tolerance to standing without pain for 15 minutes at a time in order to provide care for son and perform ADLs.    Baseline  05/02/2019 - immediate onset of pain in standing    Time  8    Period  Weeks    Status  New    Target Date  06/27/19      PT LONG TERM GOAL #2   Title  In 8 weeks, pt will perform transfers from supine to sitting and sitting to supine with an independent controlled motor pattern.    Baseline  05/02/19; modified independent, with subjective report of minA needed occassionally    Time  8    Period  Weeks    Status  New    Target Date  06/27/19      PT LONG TERM GOAL #3   Title  Patient will increase FOTO score to 61 to demonstrate predicted increase in functional mobility to complete ADLs    Baseline  05/02/19 42    Time  8    Period  Weeks    Status  New    Target Date  06/27/19      PT LONG TERM GOAL #4   Title  Pt will improve gross  motor strength of bilat hips by 1 point in order to improve tolerance for activity.    Baseline   hip flexion 3/5 bilat, IR: 2/5, ER 2/5, ABD (R 3/5, L 3+/5), ADD (3/5), EXT (3-/5)    Time  8    Period  Weeks    Status  New    Target Date  06/27/19            Plan - 05/04/19 1019    Clinical Impression Statement  PT utlized manual and stretching to reduce muscle tension and pain with good success. Patient reports 0/10 pain following session. PT utilized therex for hip/core stabilization and strengthening with good success, patient able to comply with all cuing for proper technique with good motivation throughout session. PT advised patient to attempt to practice STS transfers with today's motor control pattern, with good understanding. PT will continue progression as able.    Personal Factors and Comorbidities  Comorbidity 3+;Age;Time since onset of injury/illness/exacerbation;Fitness;Social Background    Comorbidities  HTN, diabetes, chronic kidney disease, stroke    Examination-Activity Limitations  Bend;Caring for Others;Sleep;Lift;Sit;Stand;Stairs;Carry;Locomotion Level;Squat    Examination-Participation Restrictions  Cleaning;Community Activity;Meal Prep;Other    Stability/Clinical Decision Making  Evolving/Moderate complexity    Clinical Decision Making  Moderate    Rehab Potential  Fair    Clinical Impairments Affecting Rehab Potential  (-) age, potential difficulty with motivation; prior strokes, chronicity of pain, severity of pain    PT Frequency  2x / week    PT Duration  8 weeks    PT Treatment/Interventions  ADLs/Self Care Home Management;Electrical Stimulation;Moist Heat;Functional mobility training;Therapeutic activities;Therapeutic exercise;Neuromuscular re-education;Patient/family education;Manual techniques;Passive range of motion;Dry needling;Cryotherapy;Ultrasound;Taping;Gait training;Balance training    PT Next Visit Plan  increase tolerance for standing, stretching, increase tolerance for movement    PT Home Exercise Plan  supine windshield wipers (legs side to  side x10 ea direction), supine leg pull to chest hold for 30 sec x2 (thomas test position)    Consulted and Agree with Plan of Care  Patient       Patient will benefit from skilled therapeutic intervention in order to improve the following deficits and impairments:  Decreased activity tolerance, Decreased endurance, Decreased range of motion, Decreased strength, Improper body mechanics, Pain, Decreased balance, Decreased coordination, Decreased mobility, Difficulty walking, Impaired flexibility, Impaired tone, Postural dysfunction  Visit Diagnosis: Right-sided low back pain without sciatica, unspecified chronicity  Chronic left shoulder pain  Chronic right shoulder pain     Problem List Patient Active Problem List   Diagnosis Date Noted  . Neuropathic pain 09/14/2016  . 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 Chronic low back pain 07/29/2016  . Lumbar lateral recess stenosis (L2-3, L3-4, L4-5, and L5-S1) 07/29/2016  . Lumbar foraminal stenosis (Bilateral L4-5 and L5-S1) 07/29/2016  . History of whiplash injury to neck 07/29/2016  . Vitamin D deficiency 06/23/2016  . Stage III chronic kidney disease 03/23/2016  . Diabetes mellitus type 2, uncomplicated ([place]) [id number]  . Rheumatoid arthritis 03/18/2016  . Chronic pain syndrome 03/18/2016  . Long term current use of opiate analgesic 03/18/2016  . Long term prescription opiate use 03/18/2016  . Opiate use (10 MME/Day) 03/18/2016  . Encounter for pain management planning 03/18/2016  . Encounter for therapeutic drug level monitoring 03/18/2016  . Current use of long term anticoagulation (Plavix) 03/18/2016  . Hyperlipidemia 03/18/2016  .  GERD (gastroesophageal reflux disease) 03/18/2016  . History of thalamic infarction (Left) 03/18/2016  . Chronic shoulder pain (Location of Primary  Source of Pain) (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) 01/07/2016  . History of stroke 01/07/2016  . Complex regional pain syndrome i of left lower limb 12/02/2015  . Essential hypertension with goal blood pressure less than 130/80 09/30/2015  . Chronic upper extremity numbness  (Right) 06/30/2015  . Iron deficiency anemia 06/18/2015  . History of CVA (cerebrovascular accident) 04/02/2015  . 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 [place], [address] Phone: [phone]   Fax:  [phone]  Name: SEEMA WHORTON MRN: [id number] Date of Birth: 10-16-1950

## 2019-05-07 ENCOUNTER — Other Ambulatory Visit: Payer: Self-pay

## 2019-05-07 ENCOUNTER — Ambulatory Visit: Payer: Medicare Other | Admitting: Physical Therapy

## 2019-05-07 ENCOUNTER — Ambulatory Visit (INDEPENDENT_AMBULATORY_CARE_PROVIDER_SITE_OTHER): Payer: Medicare Other | Admitting: Family Medicine

## 2019-05-07 ENCOUNTER — Encounter: Payer: Self-pay | Admitting: Family Medicine

## 2019-05-07 VITALS — BP 126/78 | HR 62 | Temp 96.6°F | Ht 62.0 in | Wt 139.0 lb

## 2019-05-07 DIAGNOSIS — G890 Central pain syndrome: Secondary | ICD-10-CM

## 2019-05-07 DIAGNOSIS — D631 Anemia in chronic kidney disease: Secondary | ICD-10-CM

## 2019-05-07 DIAGNOSIS — I10 Essential (primary) hypertension: Secondary | ICD-10-CM

## 2019-05-07 DIAGNOSIS — E1169 Type 2 diabetes mellitus with other specified complication: Secondary | ICD-10-CM

## 2019-05-07 DIAGNOSIS — E559 Vitamin D deficiency, unspecified: Secondary | ICD-10-CM

## 2019-05-07 DIAGNOSIS — K219 Gastro-esophageal reflux disease without esophagitis: Secondary | ICD-10-CM

## 2019-05-07 DIAGNOSIS — J449 Chronic obstructive pulmonary disease, unspecified: Secondary | ICD-10-CM

## 2019-05-07 DIAGNOSIS — Z1159 Encounter for screening for other viral diseases: Secondary | ICD-10-CM

## 2019-05-07 DIAGNOSIS — I69328 Other speech and language deficits following cerebral infarction: Secondary | ICD-10-CM

## 2019-05-07 DIAGNOSIS — M069 Rheumatoid arthritis, unspecified: Secondary | ICD-10-CM | POA: Diagnosis not present

## 2019-05-07 DIAGNOSIS — Z7901 Long term (current) use of anticoagulants: Secondary | ICD-10-CM

## 2019-05-07 DIAGNOSIS — E785 Hyperlipidemia, unspecified: Secondary | ICD-10-CM

## 2019-05-07 DIAGNOSIS — I69359 Hemiplegia and hemiparesis following cerebral infarction affecting unspecified side: Secondary | ICD-10-CM

## 2019-05-07 DIAGNOSIS — N184 Chronic kidney disease, stage 4 (severe): Secondary | ICD-10-CM

## 2019-05-07 DIAGNOSIS — F321 Major depressive disorder, single episode, moderate: Secondary | ICD-10-CM

## 2019-05-07 DIAGNOSIS — Z8673 Personal history of transient ischemic attack (TIA), and cerebral infarction without residual deficits: Secondary | ICD-10-CM

## 2019-05-07 MED ORDER — PIOGLITAZONE HCL 15 MG PO TABS: 15.0000 mg | ORAL_TABLET | Freq: Every day | ORAL | 2 refills | Status: DC

## 2019-05-07 MED ORDER — SITAGLIPTIN PHOSPHATE 25 MG PO TABS: 25.0000 mg | ORAL_TABLET | Freq: Every day | ORAL | 2 refills | Status: DC

## 2019-05-07 MED ORDER — ATORVASTATIN CALCIUM 20 MG PO TABS: 20.0000 mg | ORAL_TABLET | Freq: Every day | ORAL | 3 refills | Status: DC

## 2019-05-07 MED ORDER — CLOPIDOGREL BISULFATE 75 MG PO TABS: 75.0000 mg | ORAL_TABLET | Freq: Every day | ORAL | 3 refills | Status: DC

## 2019-05-07 NOTE — Progress Notes (Signed)
Patient: Kelli Gutierrez Female    DOB: 1950-09-15   69 y.o.   MRN: [id number] Visit Date: 05/08/2019  Today's Provider: Lavon Paganini, MD   Chief Complaint  Patient presents with  . Establish Care   Subjective:     HPI  T2DM - Checking BG at home: no - Medications: metformin, jardiance (thinks that Januvia was discontinued at last visit and not Jardiance.  She ran out of Metformin was no longer being prescribed, but she did borrow a months worth from a friend) - Compliance: Confused about what medications to take, but is taking them daily - Diet: Unsure - Exercise: None currently - eye exam: [place], but unsure when her last appointment was - foot exam: Needs - microalbumin: N/A, on ACE/ARB - denies symptoms of hypoglycemia, polyuria, polydipsia, numbness extremities, foot ulcers/trauma  HTN: - Medications: Amlodipine 5 mg daily, lisinopril 20 mg daily, metoprolol 50 mg twice daily - Compliance: Good - Checking BP at home: No - Denies any SOB, CP, vision changes, LE edema, medication SEs, or symptoms of hypotension - Diet: Unsure - Exercise: None currently  HLD - medications: Atorvastatin 20 mg daily - compliance: She reports that she is taking it every day, but she does not have it in the bag with the rest of her medications today - medication SEs: None  Rheumatoid arthritis: Diagnosed many years ago.  She is followed by [facility] rheumatology.  She is taking Plaquenil with good compliance.  She knows that she needs regular eye exams while on Plaquenil, but has not seen the eye doctor recently changes COVID-19 pandemic.  She also suffers from chronic left shoulder pain.  She was previously seeing the pain clinic, but did not feel like the injections were helpful and stopped seeing them.  History of CVA: She had a CVA in 1999 and in 2016.  She has residual left facial droop and dysarthria from her first stroke as well as right sided weakness and  neuropathy as a result of her second stroke.  She previously tried gabapentin, but reports that it made her sick and she could not tolerate it.  Patient has CKD stage IV.  She established with a nephrologist within the last 6 months.  Changes were made to her medications, but she did not understand these changes.  She knows that she also has anemia, which we discussed is related to her CKD.  She is not taking any NSAIDs at this time.  GERD: Mainly reports this as nausea that can occur after she eats or in the mornings.  She takes omeprazole as needed which helps.  She reports history of vitamin D deficiency, but she is no longer taking vitamin D supplement.  She is unsure what her last level was.  Discussed patient's elevated PHQ-9 score with her today.  She states that she is depressed related to her multiple chronic medical conditions.  She is worried about making sure that she lives a long time, because she cares for her adult son with significant developmental delay.  She has never taken a medication for depression, seen a therapist, or seen a psychiatrist.  She is not interested in taking medication for this.  She will consider speaking to the CCM social worker.  Patient has been previously seen by Dr. Raul Del, pulmonology, in 2017.  She was seen initially for chronic cough.  She was diagnosed with mild COPD and given as needed albuterol inhaler.  She does not  seem to have this any longer or be taking it.  She denies any problems with her breathing.   Allergies  Allergen Reactions  . Hydrocodone     Other reaction(s): Dizziness     Current Outpatient Medications:  .  amLODipine (NORVASC) 5 MG tablet, Take 5 mg by mouth daily., Disp: , Rfl:  .  aspirin 81 MG chewable tablet, Chew 81 mg by mouth daily., Disp: , Rfl:  .  atorvastatin (LIPITOR) 20 MG tablet, Take 1 tablet (20 mg total) by mouth daily., Disp: 90 tablet, Rfl: 3 .  hydroxychloroquine (PLAQUENIL) 200 MG tablet, Take 200 mg by  mouth daily., Disp: , Rfl:  .  lisinopril (PRINIVIL,ZESTRIL) 20 MG tablet, Take 20 mg by mouth daily., Disp: , Rfl:  .  metoprolol (LOPRESSOR) 50 MG tablet, Take 50 mg by mouth 2 (two) times daily., Disp: , Rfl:  .  clopidogrel (PLAVIX) 75 MG tablet, Take 1 tablet (75 mg total) by mouth daily., Disp: 90 tablet, Rfl: 3 .  pioglitazone (ACTOS) 15 MG tablet, Take 1 tablet (15 mg total) by mouth daily., Disp: 30 tablet, Rfl: 2 .  sitaGLIPtin (JANUVIA) 25 MG tablet, Take 1 tablet (25 mg total) by mouth daily., Disp: 30 tablet, Rfl: 2 .  traMADol (ULTRAM) 50 MG tablet, Take 1 tablet (50 mg total) by mouth every 6 (six) hours as needed., Disp: 20 tablet, Rfl: 0  Review of Systems  Constitutional: Positive for fatigue.  HENT: Negative.   Eyes: Negative.   Respiratory: Negative.   Cardiovascular: Negative.   Gastrointestinal: Negative.        Occasional reflux.  Pt only uses Omeprazole as needed.   Endocrine: Positive for polyuria. Negative for cold intolerance, heat intolerance, polydipsia and polyphagia.  Genitourinary: Negative.   Musculoskeletal: Positive for arthralgias, back pain and myalgias. Negative for gait problem, joint swelling, neck pain and neck stiffness.       History of RA   Skin: Negative.   Allergic/Immunologic: Negative.   Neurological: Positive for weakness and numbness. Negative for dizziness, tremors, seizures, syncope, facial asymmetry, speech difficulty, light-headedness and headaches.       Secondary to [facility] in 2016  Hematological: Negative.   Psychiatric/Behavioral: Positive for decreased concentration. Negative for agitation, behavioral problems, confusion, dysphoric mood, hallucinations, self-injury, sleep disturbance and suicidal ideas. The patient is nervous/anxious. The patient is not hyperactive.    Past Medical History:  Diagnosis Date  . Acute CVA (cerebrovascular accident) ([place]) 12/17/2014  . Anemia   . Breast pain, right 06/01/2015  . Chronic kidney  disease 12/2015  . CVA (cerebral infarction) 12/17/2014  . Diabetes mellitus without complication ([place])   . GERD (gastroesophageal reflux disease)   . Hyperlipidemia   . Hypertension   . Lumbar radiculitis 12/06/2013  . Meningitis   . Rheumatoid arthritis ([place])   . Stroke [facility])     Past Surgical History:  Procedure Laterality Date  . APPENDECTOMY    . CESAREAN SECTION     4  . CHOLECYSTECTOMY    . MIDDLE EAR SURGERY       Family History  Problem Relation Age of Onset  . Leukemia Mother        unsure of conditions, was "healthy, old lady"  . Stroke Father   . Diabetes Father   . Diabetes Sister   . Breast cancer Neg Hx   . Colon cancer Neg Hx     Social History   Tobacco Use  . Smoking status: Never Smoker  .  Smokeless tobacco: Never Used  Substance Use Topics  . Alcohol use: No      Objective:   BP 126/78 (BP Location: Left Arm, Patient Position: Sitting, Cuff Size: Large)   Pulse 62   Temp (!) 96.6 F (35.9 C) (Temporal)   Ht 5\' 2"  (1.575 m)   Wt 139 lb (63 kg)   SpO2 99%   BMI 25.42 kg/m  Vitals:   05/07/19 1342  BP: 126/78  Pulse: 62  Temp: (!) 96.6 F (35.9 C)  TempSrc: Temporal  SpO2: 99%  Weight: 139 lb (63 kg)  Height: 5\' 2"  (1.575 m)  Body mass index is 25.42 kg/m.   Physical Exam Vitals reviewed.  Constitutional:      General: She is not in acute distress.    Appearance: Normal appearance. She is well-developed. She is not diaphoretic.  HENT:     Head: Normocephalic and atraumatic.     Right Ear: Tympanic membrane, ear canal and external ear normal.     Left Ear: Tympanic membrane, ear canal and external ear normal.  Eyes:     General: No scleral icterus.    Conjunctiva/sclera: Conjunctivae normal.     Pupils: Pupils are equal, round, and reactive to light.  Neck:     Thyroid: No thyromegaly.  Cardiovascular:     Rate and Rhythm: Normal rate and regular rhythm.     Pulses: Normal pulses.     Heart sounds: Normal heart sounds. No  murmur.  Pulmonary:     Effort: Pulmonary effort is normal. No respiratory distress.     Breath sounds: Normal breath sounds. No wheezing or rales.  Abdominal:     General: There is no distension.     Palpations: Abdomen is soft.     Tenderness: There is no abdominal tenderness. There is no guarding or rebound.  Musculoskeletal:        General: No deformity.     Cervical back: Neck supple.     Right lower leg: No edema.     Left lower leg: No edema.  Lymphadenopathy:     Cervical: No cervical adenopathy.  Skin:    General: Skin is warm and dry.     Capillary Refill: Capillary refill takes less than 2 seconds.     Findings: No rash.  Neurological:     Mental Status: She is alert and oriented to person, place, and time. Mental status is at baseline.     Comments: R sided weakness, L sided facial droop (baseline)  Psychiatric:        Mood and Affect: Mood normal.        Behavior: Behavior normal.        Thought Content: Thought content normal.      Results for orders placed or performed in visit on 05/07/19  Hepatitis C Antibody  Result Value Ref Range   Hep C Virus Ab 0.3 0.0 - 0.9 s/co ratio  Comprehensive metabolic panel  Result Value Ref Range   Glucose 91 65 - 99 mg/dL   BUN 28 (H) 8 - 27 mg/dL   Creatinine, Ser 1.52 (H) 0.57 - 1.00 mg/dL   GFR calc non Af Amer 35 (L) >59 mL/min/1.73   GFR calc Af Amer 40 (L) >59 mL/min/1.73   BUN/Creatinine Ratio 18 12 - 28   Sodium 140 134 - 144 mmol/L   Potassium 4.6 3.5 - 5.2 mmol/L   Chloride 108 (H) 96 - 106 mmol/L   CO2 16 (L) 20 -  29 mmol/L   Calcium 9.4 8.7 - 10.3 mg/dL   Total Protein 7.9 6.0 - 8.5 g/dL   Albumin 4.0 3.8 - 4.8 g/dL   Globulin, Total 3.9 1.5 - 4.5 g/dL   Albumin/Globulin Ratio 1.0 (L) 1.2 - 2.2   Bilirubin Total 0.4 0.0 - 1.2 mg/dL   Alkaline Phosphatase 111 39 - 117 IU/L   AST 16 0 - 40 IU/L   ALT 13 0 - 32 IU/L  Lipid panel  Result Value Ref Range   Cholesterol, Total 174 100 - 199 mg/dL    Triglycerides 162 (H) 0 - 149 mg/dL   HDL 45 >39 mg/dL   VLDL Cholesterol Cal 28 5 - 40 mg/dL   LDL Chol Calc (NIH) 101 (H) 0 - 99 mg/dL   Chol/HDL Ratio 3.9 0.0 - 4.4 ratio  Hemoglobin A1c  Result Value Ref Range   Hgb A1c MFr Bld 8.1 (H) 4.8 - 5.6 %   Est. average glucose Bld gHb Est-mCnc 186 mg/dL  CBC w/Diff/Platelet  Result Value Ref Range   WBC 7.3 3.4 - 10.8 x10E3/uL   RBC 4.05 3.77 - 5.28 x10E6/uL   Hemoglobin 11.8 11.1 - 15.9 g/dL   Hematocrit 35.8 34.0 - 46.6 %   MCV 88 79 - 97 fL   MCH 29.1 26.6 - 33.0 pg   MCHC 33.0 31.5 - 35.7 g/dL   RDW 13.2 11.7 - 15.4 %   Platelets 238 150 - 450 x10E3/uL   Neutrophils 57 Not Estab. %   Lymphs 31 Not Estab. %   Monocytes 7 Not Estab. %   Eos 4 Not Estab. %   Basos 1 Not Estab. %   Neutrophils Absolute 4.3 1.4 - 7.0 x10E3/uL   Lymphocytes Absolute 2.3 0.7 - 3.1 x10E3/uL   Monocytes Absolute 0.5 0.1 - 0.9 x10E3/uL   EOS (ABSOLUTE) 0.3 0.0 - 0.4 x10E3/uL   Basophils Absolute 0.1 0.0 - 0.2 x10E3/uL   Immature Granulocytes 0 Not Estab. %   Immature Grans (Abs) 0.0 0.0 - 0.1 x10E3/uL  Fe+TIBC+Fer  Result Value Ref Range   Total Iron Binding Capacity 297 250 - 450 ug/dL   UIBC 239 118 - 369 ug/dL   Iron 58 27 - 139 ug/dL   Iron Saturation 20 15 - 55 %   Ferritin 38 15 - 150 ng/mL  VITAMIN D 25 Hydroxy (Vit-D Deficiency, Fractures)  Result Value Ref Range   Vit D, 25-Hydroxy 18.1 (L) 30.0 - 100.0 ng/mL       Assessment & Plan    Will send ROI to previous PCP in [place] [facility] for last eye exam and other health maintenance Pending review of records, will determine what vaccinations and other health maintenance that she needs at that time  Problem List Items Addressed This Visit      Cardiovascular and Mediastinum   Essential hypertension with goal blood pressure less than 130/80    Well controlled Continue current medications Recheck metabolic panel F/u in 3-6 months       Relevant Medications   amLODipine (NORVASC) 5  MG tablet   atorvastatin (LIPITOR) 20 MG tablet   Other Relevant Orders   Comprehensive metabolic panel (Completed)   Referral to Chronic Care Management Services     Respiratory   COPD, mild ([place])    Previously diagnosed by pulmonology She is well controlled currently Not currently on any medications Consider refilling albuterol inhaler to use as needed        Digestive  GERD (gastroesophageal reflux disease)    Chronic and intermittent issue with history of iron deficiency anemia Recheck CBC Advised H2 blocker use as needed rather than PPI, given significant CKD      Relevant Orders   Referral to Chronic Care Management Services     Endocrine   Diabetes mellitus type 2, uncomplicated ([place]) - Primary    Previously uncontrolled She has not been managed for her diabetes in at least a year Given her significant reduced GFR, we need to make significant changes to her diabetes medications We did discuss that she will likely need to be on insulin, but she is very hesitant to try this currently Discontinue Jardiance and Metformin Given her GFR, her max dose of Januvia is 25 mg daily, so we will decrease this Start Actos 15 mg daily-discussed risks, but she does not have heart failure, so we can go ahead and use this We will plan to dose increase this in the future She is on an ACE inhibitor and statin We will send ROI for last eye exam and encouraged her to call and schedule her next eye exam Foot exam at next visit Will get ROI from previous PCP to review her vaccinations and plan to update her at next visit Recheck A1c Follow-up in 6 weeks      Relevant Medications   pioglitazone (ACTOS) 15 MG tablet   sitaGLIPtin (JANUVIA) 25 MG tablet   atorvastatin (LIPITOR) 20 MG tablet   Hyperlipidemia associated with type 2 diabetes mellitus (HCC)    Chronic Previous control unknown Goal LDL less than 70 Continue atorvastatin at current dose-new Rx sent to pharmacy Recheck FLP  and CMP      Relevant Medications   pioglitazone (ACTOS) 15 MG tablet   sitaGLIPtin (JANUVIA) 25 MG tablet   atorvastatin (LIPITOR) 20 MG tablet   Other Relevant Orders   Comprehensive metabolic panel (Completed)   Lipid panel (Completed)   Referral to Chronic Care Management Services     Nervous and Auditory   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)    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 She has tried gabapentin and did not tolerate it previously She was previously followed by pain management, but has not seen them in several years Could consider Lyrica in the future, or trying a lower dose of gabapentin if patient is willing      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 ([place])    Chronic and stable Previous strokes in 1999 and 2016, with right-sided weakness and left-sided facial droop and dysarthria as a result        Musculoskeletal and Integument   Rheumatoid arthritis (Chronic)    Chronic and stable Followed by rheumatology Continue Plaquenil From chart review, appears that patient has previously tried and failed methotrexate, sulfasalazine, Enbrel, leflunomide, and Xeljanz Encouraged regular eye exams      Relevant Orders   Referral to Chronic Care Management Services     Genitourinary   Stage III chronic kidney disease    Chronic Now being followed by nephrology Reviewed last labs that showed GFR of 25 and stage IV CKD It is possible that this was AKI on CKD Recheck metabolic panel Encouraged regular follow-up with nephrology Avoid nephrotoxins Renally dose adjust medications, as above      Anemia in stage 4 chronic kidney disease ([place])    Previously diagnosed with anemia  Likely related to her CKD Recheck CBC and iron panel      Relevant Orders   CBC w/Diff/Platelet (Completed)   Fe+TIBC+Fer (Completed)   Referral to Chronic Care  Management Services     Other   Current use of long term anticoagulation (Plavix) (Chronic)    Monitor CBC q8m      History of CVA (cerebrovascular accident)    Chronic and stable Residual deficits as above Continue aspirin New prescription for Plavix sent so that she can resume this      Relevant Orders   Referral to Chronic Care Management Services   Vitamin D deficiency    Previously diagnosed Not taking any supplementation at this time Recheck vitamin D level      Relevant Orders   VITAMIN D 25 Hydroxy (Vit-D Deficiency, Fractures) (Completed)   Current moderate episode of major depressive disorder without prior episode ([place])    Patient with moderate MDD symptoms This is related to her chronic disease and worry about who will take care of her disabled adult son if she dies Declines any medication We will refer to CCM social worker for counseling resources Continue to monitor      Relevant Orders   Referral to Chronic Care Management Services   RESOLVED: History of stroke   Relevant Medications   clopidogrel (PLAVIX) 75 MG tablet   Other Relevant Orders   Referral to Chronic Care Management Services    Other Visit Diagnoses    Need for hepatitis C screening test       Relevant Orders   Hepatitis C Antibody (Completed)       Return in about 6 weeks (around 06/18/2019) for chronic disease f/u.   Total time spent on today's visit was greater than 60 minutes, including both face-to-face time and nonface-to-face time personally spent on review of chart (labs and imaging), discussing labs and goals, discussing further work-up, treatment options, referrals to specialist if needed, reviewing outside records of pertinent, answering patient's questions, and coordinating care.   The entirety of the information documented in the History of Present Illness, Review of Systems and Physical Exam were personally obtained by me. Portions of this information were initially  documented by Ashley Royalty, CMA and reviewed by me for thoroughness and accuracy.    Lavayah Vita, Dionne Bucy, MD MPH [facility]

## 2019-05-07 NOTE — Patient Instructions (Addendum)
Call [facility] to schedule a follow-up appointment  [phone]  Stop Metformin, Jardiance Januvia is new dose will be 25 mg daily -a new prescription was sent to your pharmacy Start Actos 15 mg daily, also for your diabetes  Avoid omeprazole.  If you have any reflux, try Pepcid (famotidine)  Continue aspirin and resume Plavix -a prescription was sent to your pharmacy  Avoid ibuprofen, Aleve, Advil.  Tylenol is okay as needed  Make sure you are taking atorvastatin for your cholesterol.

## 2019-05-08 ENCOUNTER — Ambulatory Visit: Payer: Medicare Other | Admitting: Physical Therapy

## 2019-05-08 ENCOUNTER — Telehealth: Payer: Self-pay | Admitting: Family Medicine

## 2019-05-08 ENCOUNTER — Telehealth: Payer: Self-pay

## 2019-05-08 ENCOUNTER — Encounter: Payer: Self-pay | Admitting: Physical Therapy

## 2019-05-08 DIAGNOSIS — I69359 Hemiplegia and hemiparesis following cerebral infarction affecting unspecified side: Secondary | ICD-10-CM | POA: Insufficient documentation

## 2019-05-08 DIAGNOSIS — I69328 Other speech and language deficits following cerebral infarction: Secondary | ICD-10-CM | POA: Insufficient documentation

## 2019-05-08 DIAGNOSIS — M545 Low back pain, unspecified: Secondary | ICD-10-CM

## 2019-05-08 DIAGNOSIS — J449 Chronic obstructive pulmonary disease, unspecified: Secondary | ICD-10-CM | POA: Insufficient documentation

## 2019-05-08 DIAGNOSIS — G8929 Other chronic pain: Secondary | ICD-10-CM

## 2019-05-08 DIAGNOSIS — M25511 Pain in right shoulder: Secondary | ICD-10-CM

## 2019-05-08 DIAGNOSIS — D631 Anemia in chronic kidney disease: Secondary | ICD-10-CM | POA: Insufficient documentation

## 2019-05-08 LAB — COMPREHENSIVE METABOLIC PANEL
ALT: 13 IU/L (ref 0–32)
AST: 16 IU/L (ref 0–40)
Albumin/Globulin Ratio: 1 — ABNORMAL LOW (ref 1.2–2.2)
Albumin: 4 g/dL (ref 3.8–4.8)
Alkaline Phosphatase: 111 IU/L (ref 39–117)
BUN/Creatinine Ratio: 18 (ref 12–28)
BUN: 28 mg/dL — ABNORMAL HIGH (ref 8–27)
Bilirubin Total: 0.4 mg/dL (ref 0.0–1.2)
CO2: 16 mmol/L — ABNORMAL LOW (ref 20–29)
Calcium: 9.4 mg/dL (ref 8.7–10.3)
Chloride: 108 mmol/L — ABNORMAL HIGH (ref 96–106)
Creatinine, Ser: 1.52 mg/dL — ABNORMAL HIGH (ref 0.57–1.00)
GFR calc Af Amer: 40 mL/min/{1.73_m2} — ABNORMAL LOW (ref >59)
GFR calc non Af Amer: 35 mL/min/{1.73_m2} — ABNORMAL LOW (ref >59)
Globulin, Total: 3.9 g/dL (ref 1.5–4.5)
Glucose: 91 mg/dL (ref 65–99)
Potassium: 4.6 mmol/L (ref 3.5–5.2)
Sodium: 140 mmol/L (ref 134–144)
Total Protein: 7.9 g/dL (ref 6.0–8.5)

## 2019-05-08 LAB — CBC WITH DIFFERENTIAL/PLATELET
Basophils Absolute: 0.1 10*3/uL (ref 0.0–0.2)
Basos: 1 %
EOS (ABSOLUTE): 0.3 10*3/uL (ref 0.0–0.4)
Eos: 4 %
Hematocrit: 35.8 % (ref 34.0–46.6)
Hemoglobin: 11.8 g/dL (ref 11.1–15.9)
Immature Grans (Abs): 0 10*3/uL (ref 0.0–0.1)
Immature Granulocytes: 0 %
Lymphocytes Absolute: 2.3 10*3/uL (ref 0.7–3.1)
Lymphs: 31 %
MCH: 29.1 pg (ref 26.6–33.0)
MCHC: 33 g/dL (ref 31.5–35.7)
MCV: 88 fL (ref 79–97)
Monocytes Absolute: 0.5 10*3/uL (ref 0.1–0.9)
Monocytes: 7 %
Neutrophils Absolute: 4.3 10*3/uL (ref 1.4–7.0)
Neutrophils: 57 %
Platelets: 238 10*3/uL (ref 150–450)
RBC: 4.05 x10E6/uL (ref 3.77–5.28)
RDW: 13.2 % (ref 11.7–15.4)
WBC: 7.3 10*3/uL (ref 3.4–10.8)

## 2019-05-08 LAB — LIPID PANEL
Chol/HDL Ratio: 3.9 ratio (ref 0.0–4.4)
Cholesterol, Total: 174 mg/dL (ref 100–199)
HDL: 45 mg/dL (ref >39)
LDL Chol Calc (NIH): 101 mg/dL — ABNORMAL HIGH (ref 0–99)
Triglycerides: 162 mg/dL — ABNORMAL HIGH (ref 0–149)
VLDL Cholesterol Cal: 28 mg/dL (ref 5–40)

## 2019-05-08 LAB — IRON,TIBC AND FERRITIN PANEL
Ferritin: 38 ng/mL (ref 15–150)
Iron Saturation: 20 % (ref 15–55)
Iron: 58 ug/dL (ref 27–139)
Total Iron Binding Capacity: 297 ug/dL (ref 250–450)
UIBC: 239 ug/dL (ref 118–369)

## 2019-05-08 LAB — HEMOGLOBIN A1C
Est. average glucose Bld gHb Est-mCnc: 186 mg/dL
Hgb A1c MFr Bld: 8.1 % — ABNORMAL HIGH (ref 4.8–5.6)

## 2019-05-08 LAB — HEPATITIS C ANTIBODY: Hep C Virus Ab: 0.3 s/co ratio (ref 0.0–0.9)

## 2019-05-08 LAB — VITAMIN D 25 HYDROXY (VIT D DEFICIENCY, FRACTURES): Vit D, 25-Hydroxy: 18.1 ng/mL — ABNORMAL LOW (ref 30.0–100.0)

## 2019-05-08 MED ORDER — VITAMIN D (ERGOCALCIFEROL) 1.25 MG (50000 UNIT) PO CAPS: 50000.0000 [IU] | ORAL_CAPSULE | ORAL | 0 refills | Status: DC

## 2019-05-08 NOTE — Assessment & Plan Note (Signed)
Well controlled Continue current medications Recheck metabolic panel F/u in 3-6 months  

## 2019-05-08 NOTE — Assessment & Plan Note (Signed)
Chronic Previous control unknown Goal LDL less than 70 Continue atorvastatin at current dose-new Rx sent to pharmacy Recheck FLP and CMP

## 2019-05-08 NOTE — Assessment & Plan Note (Signed)
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

## 2019-05-08 NOTE — Assessment & Plan Note (Signed)
Chronic and stable Residual deficits as above Continue aspirin New prescription for Plavix sent so that she can resume this

## 2019-05-08 NOTE — Assessment & Plan Note (Signed)
Monitor CBC q39m

## 2019-05-08 NOTE — Assessment & Plan Note (Addendum)
Chronic and stable Followed by rheumatology Continue Plaquenil From chart review, appears that patient has previously tried and failed methotrexate, sulfasalazine, Enbrel, leflunomide, and Morrie Sheldon Encouraged regular eye exams

## 2019-05-08 NOTE — Assessment & Plan Note (Signed)
Chronic Now being followed by nephrology Reviewed last labs that showed GFR of 25 and stage IV CKD It is possible that this was AKI on CKD Recheck metabolic panel Encouraged regular follow-up with nephrology Avoid nephrotoxins Renally dose adjust medications, as above

## 2019-05-08 NOTE — Assessment & Plan Note (Signed)
Chronic and stable Previous strokes in 1999 and 2016, with right-sided weakness and left-sided facial droop and dysarthria as a result

## 2019-05-08 NOTE — Assessment & Plan Note (Signed)
Previously uncontrolled She has not been managed for her diabetes in at least a year Given her significant reduced GFR, we need to make significant changes to her diabetes medications We did discuss that she will likely need to be on insulin, but she is very hesitant to try this currently Discontinue Jardiance and Metformin Given her GFR, her max dose of Januvia is 25 mg daily, so we will decrease this Start Actos 15 mg daily-discussed risks, but she does not have heart failure, so we can go ahead and use this We will plan to dose increase this in the future She is on an ACE inhibitor and statin We will send ROI for last eye exam and encouraged her to call and schedule her next eye exam Foot exam at next visit Will get ROI from previous PCP to review her vaccinations and plan to update her at next visit Recheck A1c Follow-up in 6 weeks

## 2019-05-08 NOTE — Assessment & Plan Note (Signed)
Previously diagnosed with anemia Likely related to her CKD Recheck CBC and iron panel

## 2019-05-08 NOTE — Telephone Encounter (Signed)
Pt advised.  Vitamin D sent to [facility] [address]   Thanks,   -Mickel Baas

## 2019-05-08 NOTE — Chronic Care Management (AMB) (Signed)
  Chronic Care Management   Note  05/08/2019 Name: Kelli Gutierrez MRN: [id number] DOB: 07-Nov-1950  [name] is a 69 y.o. year old female who is a primary care patient of Bacigalupo, Dionne Bucy, MD. I reached out to Rexene Edison by phone today in response to a referral sent by [name] PCP, Dr. Lavon Paganini     [name] was given information about Chronic Care Management services today including:  1. CCM service includes personalized support from designated clinical staff supervised by her physician, including individualized plan of care and coordination with other care providers 2. 24/7 contact phone numbers for assistance for urgent and routine care needs. 3. Service will only be billed when office clinical staff spend 20 minutes or more in a month to coordinate care. 4. Only one practitioner may furnish and bill the service in a calendar month. 5. The patient may stop CCM services at any time (effective at the end of the month) by phone call to the office staff. 6. The patient will be responsible for cost sharing (co-pay) of up to 20% of the service fee (after annual deductible is met).  Patient agreed to services and verbal consent obtained.   Follow up plan: Telephone appointment with care management team member scheduled for:05/10/2019  Glenna Durand, [place] Management ??[email or web address] ??[phone]

## 2019-05-08 NOTE — Therapy (Signed)
[facility] [address], [place], [address] Phone: [phone]   Fax:  [phone]  Physical Therapy Treatment  Patient Details  Name: Kelli Gutierrez MRN: [id number] Date of Birth: 1950-12-22 No data recorded  Encounter Date: 05/08/2019  PT End of Session - 05/08/19 1604    Visit Number  3    Number of Visits  17    Date for PT Re-Evaluation  06/27/19    PT Start Time  0400    PT Stop Time  0445    PT Time Calculation (min)  45 min    Activity Tolerance  Patient tolerated treatment well    Behavior During Therapy  [facility] for tasks assessed/performed       Past Medical History:  Diagnosis Date  . Acute CVA (cerebrovascular accident) ([place]) 12/17/2014  . Anemia   . Breast pain, right 06/01/2015  . Chronic kidney disease 12/2015  . CVA (cerebral infarction) 12/17/2014  . Diabetes mellitus without complication ([place])   . GERD (gastroesophageal reflux disease)   . Hyperlipidemia   . Hypertension   . Lumbar radiculitis 12/06/2013  . Meningitis   . Rheumatoid arthritis ([place])   . Stroke [facility])     Past Surgical History:  Procedure Laterality Date  . APPENDECTOMY    . CESAREAN SECTION     4  . CHOLECYSTECTOMY    . MIDDLE EAR SURGERY      There were no vitals filed for this visit.  Subjective Assessment - 05/08/19 1601    Subjective  Reports she had some soreness following last session, reports only 2/10 pain today. Reports she thinks therex progression was good, not too much or too little.    Pertinent History  Pt is a 69 yo female with history of stroke, type 2 diabetes, HTN, high cholesterol, RA, and chronic kidney disease. Pt has c/c of LBP that started 2 months ago with insidious onset. Pt describes pain as achy that stays in LB, favoring the R side. At worst, pt rates pain at 7-8/10, at best and currently, pain is 3/10 that stays the same throughout the day. Recently, LBP has subsided, but pt does not report  any changes to her routine to account for decrease in pain. Aggravating factors for pain include bending, walking, sitting, and laying on R side with immediate onset. Pt does not report any alleviating factors. Pt states she stays at home and watches television most of the day. Pt lives in an apartment with her son who she provides care for due to his disability. Pt pain has sometimes interfered with ability to care for son. Pt denies changes in bowel/bladder, saddle paresthesia, unrelenting night pain, fever/night sweats, unexplained weight loss/gain, and numbness and tingling. Pt goal for PT is to reduce pain without the use of medications in order to perform ADLs efficiently.    Limitations  Sitting;Walking;House hold activities;Lifting;Standing    How long can you sit comfortably?  5 min    How long can you stand comfortably?  5 min    How long can you walk comfortably?  "not too far d/t SOB w/ COPD"    Diagnostic tests  MRI in hospital    Patient Stated Goals  decrease pain for ADLs    Pain Onset  More than a month ago       Ther-Ex Nustep L1 36mins seat setting 5, BUE 7 for gentle lumbar rotation and low impact hip strengthening Lower trunk  rotations x20 3-5sec holds each direction; cuing for full 3sec hold Hooklying butterfly stretch 2x 30sec holdd SKTC 30sec each side with pain on R glute with R SKTC Bridge 2x 10 with cuing initially for glute contraction with good carry over Squat with mat table for TC to tap bottom (mat table at 19in) 2x 10 good carry over from last session with min cuing for full hip ext with good carry over Monster walks RTB 2x 39ft with TC and demo needed initially for proper technique with good carry over Standing hip abd slider 2x 10 with cuing to maintain low, mini squat position with good carry over  Manual STM with trigger point release to superior R glute fibers and over piriformis L3-S1 grade 2 CPA for decreased pain 4 bouts 30sec each bout; G3 for increased  motion 4 bouts 30sec each bout long axis R hip distraction 3x 10sec                         PT Education - 05/08/19 1603    Education provided  Yes    Education Details  therex form    Person(s) Educated  Patient    Methods  Explanation;Demonstration;Verbal cues    Comprehension  Verbalized understanding;Returned demonstration;Verbal cues required       PT Short Term Goals - 05/02/19 1638      PT SHORT TERM GOAL #1   Title  In 4 weeks, pt will increase standing tolerance to 10 minutes without pain in LB in order to improve tolerance for ADL.    Baseline  05/02/2019: pt reports immediate pain when standing    Time  4    Period  Weeks    Status  New    Target Date  05/30/19      PT SHORT TERM GOAL #2   Title  Pt will be independent with HEP in order to improve strength and balance in order to decrease fall risk and improve function at home and work.    Baseline  05/02/19 HEP given    Time  4    Period  Weeks    Status  New        PT Long Term Goals - 05/02/19 1627      PT LONG TERM GOAL #1   Title  In 8 weeks, pt will increase tolerance to standing without pain for 15 minutes at a time in order to provide care for son and perform ADLs.    Baseline  05/02/2019 - immediate onset of pain in standing    Time  8    Period  Weeks    Status  New    Target Date  06/27/19      PT LONG TERM GOAL #2   Title  In 8 weeks, pt will perform transfers from supine to sitting and sitting to supine with an independent controlled motor pattern.    Baseline  05/02/19; modified independent, with subjective report of minA needed occassionally    Time  8    Period  Weeks    Status  New    Target Date  06/27/19      PT LONG TERM GOAL #3   Title  Patient will increase FOTO score to 61 to demonstrate predicted increase in functional mobility to complete ADLs    Baseline  05/02/19 42    Time  8    Period  Weeks    Status  New  Target Date  06/27/19      PT LONG TERM  GOAL #4   Title  Pt will improve gross motor strength of bilat hips by 1 point in order to improve tolerance for activity.    Baseline  hip flexion 3/5 bilat, IR: 2/5, ER 2/5, ABD (R 3/5, L 3+/5), ADD (3/5), EXT (3-/5)    Time  8    Period  Weeks    Status  New    Target Date  06/27/19            Plan - 05/08/19 1622    Clinical Impression Statement  PT continued to utilize manual tecniques to decrease muscle tension and pain with good success. PT continued therex progression for hip and core strength with good carry over of all cuing for proper technique and proper muscle activation. PT will continue progression as able.    Personal Factors and Comorbidities  Comorbidity 3+;Age;Time since onset of injury/illness/exacerbation;Fitness;Social Background    Comorbidities  HTN, diabetes, chronic kidney disease, stroke    Examination-Activity Limitations  Bend;Caring for Others;Sleep;Lift;Sit;Stand;Stairs;Carry;Locomotion Level;Squat    Examination-Participation Restrictions  Cleaning;Community Activity;Meal Prep;Other    Stability/Clinical Decision Making  Evolving/Moderate complexity    Clinical Decision Making  Moderate    Rehab Potential  Fair    Clinical Impairments Affecting Rehab Potential  (-) age, potential difficulty with motivation; prior strokes, chronicity of pain, severity of pain    PT Frequency  2x / week    PT Duration  8 weeks    PT Treatment/Interventions  ADLs/Self Care Home Management;Electrical Stimulation;Moist Heat;Functional mobility training;Therapeutic activities;Therapeutic exercise;Neuromuscular re-education;Patient/family education;Manual techniques;Passive range of motion;Dry needling;Cryotherapy;Ultrasound;Taping;Gait training;Balance training    PT Next Visit Plan  increase tolerance for standing, stretching, increase tolerance for movement    PT Home Exercise Plan  supine windshield wipers (legs side to side x10 ea direction), supine leg pull to chest hold  for 30 sec x2 (thomas test position)    Consulted and Agree with Plan of Care  Patient       Patient will benefit from skilled therapeutic intervention in order to improve the following deficits and impairments:  Decreased activity tolerance, Decreased endurance, Decreased range of motion, Decreased strength, Improper body mechanics, Pain, Decreased balance, Decreased coordination, Decreased mobility, Difficulty walking, Impaired flexibility, Impaired tone, Postural dysfunction  Visit Diagnosis: Right-sided low back pain without sciatica, unspecified chronicity  Chronic left shoulder pain  Chronic right shoulder pain     Problem List Patient Active Problem List   Diagnosis Date Noted  . COPD, mild ([place]) 05/08/2019  . 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 ([place]) 05/08/2019  . Anemia in stage 4 chronic kidney disease ([place]) 05/08/2019  . Current moderate episode of major depressive disorder without prior episode ([place]) 05/07/2019  . Neuropathic pain 09/14/2016  . 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 Vitamin D deficiency 06/23/2016  . Stage III chronic kidney disease 03/23/2016  . Diabetes mellitus type 2, uncomplicated ([place]) [id number]  . Rheumatoid arthritis 03/18/2016  . Current use of long term anticoagulation (Plavix) 03/18/2016  . Hyperlipidemia associated with type 2 diabetes mellitus ([place]) 03/18/2016  . GERD (gastroesophageal reflux disease) 03/18/2016  . Essential hypertension with goal blood pressure less than 130/80 09/30/2015  . History of CVA (cerebrovascular accident) 04/02/2015   Shelton Silvas PT, DPT Shelton Silvas 05/08/2019, 4:50 PM  [facility] [address] [place], [address] Phone: [phone]   Fax:  [phone]  Name: BABBETTE WYSONG MRN: [id number] Date of Birth:  07/15/1950

## 2019-05-08 NOTE — Assessment & Plan Note (Signed)
Chronic and intermittent issue with history of iron deficiency anemia Recheck CBC Advised H2 blocker use as needed rather than PPI, given significant CKD

## 2019-05-08 NOTE — Assessment & Plan Note (Signed)
Patient with moderate MDD symptoms This is related to her chronic disease and worry about who will take care of her disabled adult son if she dies Declines any medication We will refer to CCM social worker for counseling resources Continue to monitor

## 2019-05-08 NOTE — Telephone Encounter (Signed)
-----   Message from Virginia Crews, MD sent at 05/08/2019  8:23 AM EST ----- Negative Hep C screening.  Kidney function is better than it was previously.  This may help [place] some with medications for the diabetes.  Normal liver function.  Cholesterol is not to goal.  Make sure you are taking Atorvastatin daily as prescribed.  A1c is elevated at 8.1.  Take medications like we discussed.  Blood counts are normal.  No anemia.  Vit D is low.  Needs to take weekly high dose Vit D supplement for the next 3 months and then we will recheck.

## 2019-05-08 NOTE — Assessment & Plan Note (Signed)
Previously diagnosed by pulmonology She is well controlled currently Not currently on any medications Consider refilling albuterol inhaler to use as needed

## 2019-05-08 NOTE — Assessment & Plan Note (Signed)
Previously diagnosed Not taking any supplementation at this time Recheck vitamin D level

## 2019-05-09 ENCOUNTER — Telehealth: Payer: Self-pay | Admitting: Family Medicine

## 2019-05-09 NOTE — Telephone Encounter (Signed)
Patient advised as below. Patient verbalizes understanding and is in agreement with treatment plan.  

## 2019-05-09 NOTE — Telephone Encounter (Signed)
She should not be taking Metformin or Jardiance due to her kidney function.  We reduced the dose of her Januvia due to her kidney function.  She should be taking Januvia 25 mg daily.  She should also be taking Actos 15 mg daily, which is the new medication that we started for her diabetes.

## 2019-05-09 NOTE — Telephone Encounter (Signed)
Copied from [place] [id number]. Topic: General - Other >> May 09, 2019 12:43 PM Keene Breath wrote: Reason for CRM: Patient called to ask the doctor or nurse why she prescribed sitaGLIPtin (JANUVIA) 25 MG tablet, because she has kidney problems and should not be taking this medication.  Please call to discuss at [phone]

## 2019-05-10 ENCOUNTER — Ambulatory Visit: Payer: Medicare Other | Admitting: Family Medicine

## 2019-05-10 ENCOUNTER — Telehealth: Payer: Medicare Other

## 2019-05-10 ENCOUNTER — Ambulatory Visit (INDEPENDENT_AMBULATORY_CARE_PROVIDER_SITE_OTHER): Payer: Medicare Other | Admitting: Pharmacist

## 2019-05-10 DIAGNOSIS — I10 Essential (primary) hypertension: Secondary | ICD-10-CM

## 2019-05-10 DIAGNOSIS — E1169 Type 2 diabetes mellitus with other specified complication: Secondary | ICD-10-CM

## 2019-05-10 DIAGNOSIS — M069 Rheumatoid arthritis, unspecified: Secondary | ICD-10-CM | POA: Diagnosis not present

## 2019-05-11 ENCOUNTER — Ambulatory Visit: Payer: Medicare Other | Admitting: Physical Therapy

## 2019-05-14 ENCOUNTER — Encounter: Payer: Self-pay | Admitting: Physical Therapy

## 2019-05-14 ENCOUNTER — Other Ambulatory Visit: Payer: Self-pay

## 2019-05-14 ENCOUNTER — Ambulatory Visit: Payer: Medicare Other | Attending: Family Medicine | Admitting: Physical Therapy

## 2019-05-14 DIAGNOSIS — M25512 Pain in left shoulder: Secondary | ICD-10-CM | POA: Insufficient documentation

## 2019-05-14 DIAGNOSIS — G8929 Other chronic pain: Secondary | ICD-10-CM | POA: Insufficient documentation

## 2019-05-14 DIAGNOSIS — M545 Low back pain, unspecified: Secondary | ICD-10-CM

## 2019-05-14 DIAGNOSIS — M25511 Pain in right shoulder: Secondary | ICD-10-CM | POA: Diagnosis present

## 2019-05-14 NOTE — Therapy (Signed)
[facility] [address] [place], [address] Phone: [phone]   Fax:  [phone]  Physical Therapy Treatment  Patient Details  Name: Kelli Gutierrez MRN: [id number] Date of Birth: 07/02/1950 No data recorded  Encounter Date: 05/14/2019  PT End of Session - 05/14/19 0954    Visit Number  4    Number of Visits  17    Date for PT Re-Evaluation  06/27/19    PT Start Time  0950    PT Stop Time  1030    PT Time Calculation (min)  40 min    Activity Tolerance  Patient tolerated treatment well    Behavior During Therapy  [facility] for tasks assessed/performed       Past Medical History:  Diagnosis Date  . Acute CVA (cerebrovascular accident) ([place]) 12/17/2014  . Anemia   . Breast pain, right 06/01/2015  . Chronic kidney disease 12/2015  . CVA (cerebral infarction) 12/17/2014  . Diabetes mellitus without complication ([place])   . GERD (gastroesophageal reflux disease)   . Hyperlipidemia   . Hypertension   . Lumbar radiculitis 12/06/2013  . Meningitis   . Rheumatoid arthritis ([place])   . Stroke [facility])     Past Surgical History:  Procedure Laterality Date  . APPENDECTOMY    . CESAREAN SECTION     4  . CHOLECYSTECTOMY    . MIDDLE EAR SURGERY      There were no vitals filed for this visit.  Subjective Assessment - 05/14/19 0952    Subjective  Reports no LBP, but some pain at the inside of the R thigh today, that se reports is off and on. Reports compliance with HEP.    Pertinent History  Pt is a 69 yo female with history of stroke, type 2 diabetes, HTN, high cholesterol, RA, and chronic kidney disease. Pt has c/c of LBP that started 2 months ago with insidious onset. Pt describes pain as achy that stays in LB, favoring the R side. At worst, pt rates pain at 7-8/10, at best and currently, pain is 3/10 that stays the same throughout the day. Recently, LBP has subsided, but pt does not report any changes to her routine to  account for decrease in pain. Aggravating factors for pain include bending, walking, sitting, and laying on R side with immediate onset. Pt does not report any alleviating factors. Pt states she stays at home and watches television most of the day. Pt lives in an apartment with her son who she provides care for due to his disability. Pt pain has sometimes interfered with ability to care for son. Pt denies changes in bowel/bladder, saddle paresthesia, unrelenting night pain, fever/night sweats, unexplained weight loss/gain, and numbness and tingling. Pt goal for PT is to reduce pain without the use of medications in order to perform ADLs efficiently.    Limitations  Sitting;Walking;House hold activities;Lifting;Standing    How long can you sit comfortably?  5 min    How long can you stand comfortably?  5 min    How long can you walk comfortably?  "not too far d/t SOB w/ COPD"    Diagnostic tests  MRI in hospital    Patient Stated Goals  decrease pain for ADLs         Ther-Ex Nustep L3 57mins seat setting 5, BUE 7 for gentle lumbar rotation and low impact hip strengthening Hooklying butterfly stretch 2x 60sec holdd Bridge 3x 10 with RTB at  knees for abd activation with good carry over of proper bridge technique, cuing for maintained abd with good carry over Hooklying ER RTB 3x 8 with cuing for even movement bilat with good carry over Reverse lunge to step to x6 bilat with demo and heavy cuing initially for proper technique with decent carry over Reverse lunge to hip flex 2x 6 each LE with unilateral UE support for balance Squat with mat table for TC to tap bottom (mat table at 19in) w/ 10# DB in front 3x 10 min cuing to maintain trunk posture with good carry over                        PT Education - 05/14/19 0954    Education provided  Yes    Education Details  therex form    Person(s) Educated  Patient    Methods  Explanation;Demonstration;Verbal cues    Comprehension   Verbalized understanding;Returned demonstration;Verbal cues required       PT Short Term Goals - 05/02/19 1638      PT SHORT TERM GOAL #1   Title  In 4 weeks, pt will increase standing tolerance to 10 minutes without pain in LB in order to improve tolerance for ADL.    Baseline  05/02/2019: pt reports immediate pain when standing    Time  4    Period  Weeks    Status  New    Target Date  05/30/19      PT SHORT TERM GOAL #2   Title  Pt will be independent with HEP in order to improve strength and balance in order to decrease fall risk and improve function at home and work.    Baseline  05/02/19 HEP given    Time  4    Period  Weeks    Status  New        PT Long Term Goals - 05/02/19 1627      PT LONG TERM GOAL #1   Title  In 8 weeks, pt will increase tolerance to standing without pain for 15 minutes at a time in order to provide care for son and perform ADLs.    Baseline  05/02/2019 - immediate onset of pain in standing    Time  8    Period  Weeks    Status  New    Target Date  06/27/19      PT LONG TERM GOAL #2   Title  In 8 weeks, pt will perform transfers from supine to sitting and sitting to supine with an independent controlled motor pattern.    Baseline  05/02/19; modified independent, with subjective report of minA needed occassionally    Time  8    Period  Weeks    Status  New    Target Date  06/27/19      PT LONG TERM GOAL #3   Title  Patient will increase FOTO score to 61 to demonstrate predicted increase in functional mobility to complete ADLs    Baseline  05/02/19 42    Time  8    Period  Weeks    Status  New    Target Date  06/27/19      PT LONG TERM GOAL #4   Title  Pt will improve gross motor strength of bilat hips by 1 point in order to improve tolerance for activity.    Baseline  hip flexion 3/5 bilat, IR: 2/5, ER 2/5, ABD (R 3/5,  L 3+/5), ADD (3/5), EXT (3-/5)    Time  8    Period  Weeks    Status  New    Target Date  06/27/19             Plan - 05/14/19 0958    Clinical Impression Statement  PT continued therex progression without manual techniques in lieu of pain. Patient is motivated throughout session and is able to complete all therex with proper technique following cuing. PT will continue progression as able.    Personal Factors and Comorbidities  Comorbidity 3+;Age;Time since onset of injury/illness/exacerbation;Fitness;Social Background    Comorbidities  HTN, diabetes, chronic kidney disease, stroke    Examination-Activity Limitations  Bend;Caring for Others;Sleep;Lift;Sit;Stand;Stairs;Carry;Locomotion Level;Squat    Examination-Participation Restrictions  Cleaning;Community Activity;Meal Prep;Other    Stability/Clinical Decision Making  Evolving/Moderate complexity    Clinical Decision Making  Moderate    Rehab Potential  Fair    Clinical Impairments Affecting Rehab Potential  (-) age, potential difficulty with motivation; prior strokes, chronicity of pain, severity of pain    PT Frequency  2x / week    PT Duration  8 weeks    PT Treatment/Interventions  ADLs/Self Care Home Management;Electrical Stimulation;Moist Heat;Functional mobility training;Therapeutic activities;Therapeutic exercise;Neuromuscular re-education;Patient/family education;Manual techniques;Passive range of motion;Dry needling;Cryotherapy;Ultrasound;Taping;Gait training;Balance training    PT Next Visit Plan  increase tolerance for standing, stretching, increase tolerance for movement    PT Home Exercise Plan  supine windshield wipers (legs side to side x10 ea direction), supine leg pull to chest hold for 30 sec x2 (thomas test position)    Consulted and Agree with Plan of Care  Patient       Patient will benefit from skilled therapeutic intervention in order to improve the following deficits and impairments:  Decreased activity tolerance, Decreased endurance, Decreased range of motion, Decreased strength, Improper body mechanics, Pain,  Decreased balance, Decreased coordination, Decreased mobility, Difficulty walking, Impaired flexibility, Impaired tone, Postural dysfunction  Visit Diagnosis: Right-sided low back pain without sciatica, unspecified chronicity  Chronic left shoulder pain  Chronic right shoulder pain     Problem List Patient Active Problem List   Diagnosis Date Noted  . COPD, mild ([place]) 05/08/2019  . 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 Anemia in stage 4 chronic kidney disease ([place]) 05/08/2019  . Current moderate episode of major depressive disorder without prior episode ([place]) 05/07/2019  . Neuropathic pain 09/14/2016  . 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 Vitamin D deficiency 06/23/2016  . Stage III chronic kidney disease 03/23/2016  . Diabetes mellitus type 2, uncomplicated ([place]) [id number]  . Rheumatoid arthritis 03/18/2016  . Current use of long term anticoagulation (Plavix) 03/18/2016  . Hyperlipidemia associated with type 2 diabetes mellitus ([place]) 03/18/2016  . GERD (gastroesophageal reflux disease) 03/18/2016  . Essential hypertension with goal blood pressure less than 130/80 09/30/2015  . History of CVA (cerebrovascular accident) 04/02/2015   Shelton Silvas PT, DPT Shelton Silvas 05/14/2019, 10:21 AM  [facility] [address], [place], [address] Phone: [phone]   Fax:  [phone]  Name: Kelli Gutierrez MRN: [id number] Date of Birth: 10-15-1950

## 2019-05-17 ENCOUNTER — Ambulatory Visit (INDEPENDENT_AMBULATORY_CARE_PROVIDER_SITE_OTHER): Payer: Medicare Other | Admitting: Pharmacist

## 2019-05-17 DIAGNOSIS — I10 Essential (primary) hypertension: Secondary | ICD-10-CM | POA: Diagnosis not present

## 2019-05-17 DIAGNOSIS — E1169 Type 2 diabetes mellitus with other specified complication: Secondary | ICD-10-CM | POA: Diagnosis not present

## 2019-05-17 DIAGNOSIS — N184 Chronic kidney disease, stage 4 (severe): Secondary | ICD-10-CM

## 2019-05-17 NOTE — Patient Instructions (Signed)
MEDICATION ADHERENCES TIPS AND STRATEGIES 1. Taking medication as prescribed improves patient outcomes in lowering blood pressure   2. Side effects of medications can be managed by decreasing doses, switching agents, stopping drugs, or adding additional therapy. Please let either the CCM team or your provider know if you have having bothersome side effects so we can modify your regimen. Do not alter your medication regimen without talking to [place].   3. Medication reminders can help patients remember to take drugs on time. If you are missing or forgetting doses you can try linking behaviors, using pill boxes, or an electronic reminder like an alarm on your phone or an app. Some people can also get automated phone calls as medication reminders  Goals Addressed            This Visit's Progress   . I want to know my medications (pt-stated)       Current Barriers:  . Polypharmacy; complex patient with multiple comorbidities including DM, HTN . Self-manages medications, Does not use a pill box or other adherence strategies   Pharmacist Clinical Goal(s):  Marland Kitchen Over the next 30 days, patient will work with PharmD and provider towards optimized medication management  Interventions: . Comprehensive medication review performed; medication list updated in electronic medical record . Requested fill history from several pharmacies patient has used in the past 1 year in order to clarify adherence . Developed a medication chart that patient can refer to at doctor office visits and to help adherence  Patient Self Care Activities:  . Patient will take medications as prescribed . Patient will focus on improved adherence by utilizing medication adherence chart   Initial goal documentation

## 2019-05-17 NOTE — Chronic Care Management (AMB) (Signed)
Chronic Care Management   Note  05/17/2019- late entry Name: Kelli Gutierrez MRN: [id number] DOB: 06-24-1950  Subjective:   Does the patient  feel that his/her medications are working for him/her?  Feels she is taking too many HTN medications  Has the patient been experiencing any side effects to the medications prescribed?  no  Does the patient measure his/her own blood glucose at home?  yes   Does the patient measure his/her own blood pressure at home? no   Does the patient have any problems obtaining medications due to transportation or finances?   no  Understanding of regimen: poor Understanding of indications: fair Potential of compliance: fair  Objective: Lab Results  Component Value Date   CREATININE 1.52 (H) 05/07/2019   CREATININE 2.09 (H) 02/03/2019   CREATININE 1.34 (H) 03/23/2016    Lab Results  Component Value Date   HGBA1C 8.1 (H) 05/07/2019    Lipid Panel     Component Value Date/Time   CHOL 174 05/07/2019 1536   TRIG 162 (H) 05/07/2019 1536   HDL 45 05/07/2019 1536   CHOLHDL 3.9 05/07/2019 1536   CHOLHDL 6.1 12/18/2014 0437   VLDL 55 (H) 12/18/2014 0437   LDLCALC 101 (H) 05/07/2019 1536    BP Readings from Last 3 Encounters:  05/07/19 126/78  02/03/19 (!) 154/59  06/30/17 125/67    Allergies  Allergen Reactions  . Hydrocodone     Other reaction(s): Dizziness    Medications Reviewed Today    Reviewed by Shelton Silvas, PT (Physical Therapist) on 05/14/19 at [id number]  Med List Status: <None>  Medication Order Taking? Sig Documenting Provider Last Dose Status Informant  amLODipine (NORVASC) 5 MG tablet [id number] No Take 5 mg by mouth daily. [provider] Not Taking Active   aspirin 81 MG chewable tablet [id number] No Chew 81 mg by mouth daily. [provider] Taking Active   atorvastatin (LIPITOR) 20 MG tablet [id number] No Take 1 tablet (20 mg total) by mouth daily. Virginia Crews, MD Taking Active   clopidogrel (PLAVIX)  75 MG tablet [id number] No Take 1 tablet (75 mg total) by mouth daily.  Patient not taking: Reported on 05/10/2019   Virginia Crews, MD Not Taking Active   hydroxychloroquine (PLAQUENIL) 200 MG tablet [id number] No Take 200 mg by mouth daily. [provider] Taking Active   lisinopril (PRINIVIL,ZESTRIL) 20 MG tablet [id number] No Take 20 mg by mouth daily. [provider] Taking Active   metoprolol (LOPRESSOR) 50 MG tablet [id number] No Take 50 mg by mouth 2 (two) times daily. Takes once daily in the evening [provider] Taking Active   pioglitazone (ACTOS) 15 MG tablet [id number] No Take 1 tablet (15 mg total) by mouth daily. Virginia Crews, MD Taking Active   sitaGLIPtin (JANUVIA) 25 MG tablet [id number] No Take 1 tablet (25 mg total) by mouth daily. Virginia Crews, MD Taking Active   traMADol Veatrice Bourbon) 50 MG tablet [id number] No Take 1 tablet (50 mg total) by mouth every 6 (six) hours as needed.  Patient not taking: Reported on 05/10/2019   Lavonia Drafts, MD Not Taking Active   Vitamin D, Ergocalciferol, (DRISDOL) 1.25 MG (50000 UNIT) CAPS capsule [id number] No Take 1 capsule (50,000 Units total) by mouth every 7 (seven) days. Virginia Crews, MD Taking Active            Med Note [name] May 10, 2019 10:08 AM) Tuesdays  Med List Note (  [name], RN 08/02/16 1328): New patient UDS done 03-18-2016 Mr 07/29/16 Medical clearance sent to Dr Melrose Nakayama to Stop Plaxix 10 days before procedure 06/29/16 (let [name] know when received so we can make appt  08/02/2016 Received permission from Dr. Melrose Nakayama to stop Plavix for 10 days prior to procedure- spoke with Loma Sousa [phone]. [name] made aware so that she can schedule patient for procedure.           Assessment:   Intervention  YES NO  Explanation   Adherence      Cannot afford medication []  []     Cannot self-administer medication appropriately []  []     Does not understand  directions [x]  []     Prefers not to take []  []     Product unavailable []  []     Forgets to take [x]  []     Other pertinent pharmacist  counseling      Goals Addressed            This Visit's Progress   . I want to know my medications (pt-stated)       Current Barriers:  . Polypharmacy; complex patient with multiple comorbidities including DM, HTN . Self-manages medications, Does not use a pill box or other adherence strategies   Pharmacist Clinical Goal(s):  Marland Kitchen Over the next 30 days, patient will work with PharmD and provider towards optimized medication management  Interventions: . Comprehensive medication review performed; medication list updated in electronic medical record . Requested fill history from several pharmacies patient has used in the past 1 year in order to clarify adherence . Developed a medication chart that patient can refer to at doctor office visits and to help adherence  Patient Self Care Activities:  . Patient will take medications as prescribed . Patient will focus on improved adherence by utilizing medication adherence chart   Initial goal documentation        Plan: Recommendations discussed with provider - will clarify with other providers regarding medication dosages  Recommendations discussed with patient - follow medication adherence chart  Follow up: Telephone follow up appointment with care management team member scheduled for: 30 days with PharmD   Ruben Reason, PharmD Clinical Pharmacist [place] [phone]

## 2019-05-18 ENCOUNTER — Encounter: Payer: Self-pay | Admitting: Physical Therapy

## 2019-05-18 ENCOUNTER — Other Ambulatory Visit: Payer: Self-pay

## 2019-05-18 ENCOUNTER — Ambulatory Visit: Payer: Medicare Other | Admitting: Physical Therapy

## 2019-05-18 DIAGNOSIS — M545 Low back pain, unspecified: Secondary | ICD-10-CM

## 2019-05-18 DIAGNOSIS — G8929 Other chronic pain: Secondary | ICD-10-CM

## 2019-05-18 DIAGNOSIS — M25511 Pain in right shoulder: Secondary | ICD-10-CM

## 2019-05-18 NOTE — Therapy (Signed)
[facility] [address] [place], [address] Phone: [phone]   Fax:  [phone]  Physical Therapy Treatment  Patient Details  Name: Kelli Gutierrez MRN: [id number] Date of Birth: [birth year] No data recorded  Encounter Date: 05/18/2019  PT End of Session - 05/18/19 1007    Visit Number  5    Number of Visits  17    Date for PT Re-Evaluation  06/27/19    PT Start Time  [id number]    PT Stop Time  1042    PT Time Calculation (min)  40 min    Activity Tolerance  Patient tolerated treatment well    Behavior During Therapy  [facility] for tasks assessed/performed       Past Medical History:  Diagnosis Date  . Acute CVA (cerebrovascular accident) ([place]) 12/17/2014  . Anemia   . Breast pain, right 06/01/2015  . Chronic kidney disease 12/2015  . CVA (cerebral infarction) 12/17/2014  . Diabetes mellitus without complication ([place])   . GERD (gastroesophageal reflux disease)   . Hyperlipidemia   . Hypertension   . Lumbar radiculitis 12/06/2013  . Meningitis   . Rheumatoid arthritis ([place])   . Stroke [facility])     Past Surgical History:  Procedure Laterality Date  . APPENDECTOMY    . CESAREAN SECTION     4  . CHOLECYSTECTOMY    . MIDDLE EAR SURGERY      There were no vitals filed for this visit.  Subjective Assessment - 05/18/19 1004    Subjective  Reports 3/10 R sided LBP today, no hip or leg pain bilat. Reports some compliance with HEP. Feeling better overall    Pertinent History  Pt is a 69 yo female with history of stroke, type 2 diabetes, HTN, high cholesterol, RA, and chronic kidney disease. Pt has c/c of LBP that started 2 months ago with insidious onset. Pt describes pain as achy that stays in LB, favoring the R side. At worst, pt rates pain at 7-8/10, at best and currently, pain is 3/10 that stays the same throughout the day. Recently, LBP has subsided, but pt does not report any changes to her routine to account for  decrease in pain. Aggravating factors for pain include bending, walking, sitting, and laying on R side with immediate onset. Pt does not report any alleviating factors. Pt states she stays at home and watches television most of the day. Pt lives in an apartment with her son who she provides care for due to his disability. Pt pain has sometimes interfered with ability to care for son. Pt denies changes in bowel/bladder, saddle paresthesia, unrelenting night pain, fever/night sweats, unexplained weight loss/gain, and numbness and tingling. Pt goal for PT is to reduce pain without the use of medications in order to perform ADLs efficiently.    Limitations  Sitting;Walking;House hold activities;Lifting;Standing    How long can you sit comfortably?  5 min    How long can you stand comfortably?  5 min    How long can you walk comfortably?  "not too far d/t SOB w/ COPD"    Diagnostic tests  MRI in hospital    Patient Stated Goals  decrease pain for ADLs       Ther-Ex Nustep L3 16mins seat setting 5, BUE 7 for gentle lumbar rotation and low impact hip strengthening Hooklying butterfly stretch 2x 60sec holdd Bridge from bosu  3x 10 with increased core demand, cuing for eccentric  lower without hip rotation to encourage this with good success BW hip hinge/Deadlift x10 with demo and multi-modal cuing for proper technique; with PVC pip cuing along spine x10 with good carry over; with 5# DB in each hand with PVC pipe cuing x10 with decent carry over adding weight Reverse lunge to hip flex 2x 10/9 each LE with unilateral UE support for balance, cuing for technique and "full stand" with good carry over Fatigue following, no pain   Manual STM withtrigger point releaseto superior R glute fibers and over piriformis L3-S1 grade 2 CPA for decreased pain 4 bouts 30sec each bout; G3 for increased motion 4 bouts 30sec each bout long axis R hip distraction 3x 10sec                       PT  Education - 05/18/19 1006    Education provided  Yes    Education Details  therex form    Person(s) Educated  Patient    Methods  Explanation;Demonstration;Verbal cues    Comprehension  Verbalized understanding;Returned demonstration;Verbal cues required       PT Short Term Goals - 05/02/19 1638      PT SHORT TERM GOAL #1   Title  In 4 weeks, pt will increase standing tolerance to 10 minutes without pain in LB in order to improve tolerance for ADL.    Baseline  05/02/2019: pt reports immediate pain when standing    Time  4    Period  Weeks    Status  New    Target Date  05/30/19      PT SHORT TERM GOAL #2   Title  Pt will be independent with HEP in order to improve strength and balance in order to decrease fall risk and improve function at home and work.    Baseline  05/02/19 HEP given    Time  4    Period  Weeks    Status  New        PT Long Term Goals - 05/02/19 1627      PT LONG TERM GOAL #1   Title  In 8 weeks, pt will increase tolerance to standing without pain for 15 minutes at a time in order to provide care for son and perform ADLs.    Baseline  05/02/2019 - immediate onset of pain in standing    Time  8    Period  Weeks    Status  New    Target Date  06/27/19      PT LONG TERM GOAL #2   Title  In 8 weeks, pt will perform transfers from supine to sitting and sitting to supine with an independent controlled motor pattern.    Baseline  05/02/19; modified independent, with subjective report of minA needed occassionally    Time  8    Period  Weeks    Status  New    Target Date  06/27/19      PT LONG TERM GOAL #3   Title  Patient will increase FOTO score to 61 to demonstrate predicted increase in functional mobility to complete ADLs    Baseline  05/02/19 42    Time  8    Period  Weeks    Status  New    Target Date  06/27/19      PT LONG TERM GOAL #4   Title  Pt will improve gross motor strength of bilat hips by 1 point in order to  improve tolerance for  activity.    Baseline  hip flexion 3/5 bilat, IR: 2/5, ER 2/5, ABD (R 3/5, L 3+/5), ADD (3/5), EXT (3-/5)    Time  8    Period  Weeks    Status  New    Target Date  06/27/19            Plan - 05/18/19 1011    Clinical Impression Statement  PT utilized manual techniques for pain reduction, and to relieve soft tissue restriction to allow for increased mobility with good success. Patinet reports no pain following. PT continued therex progression for core and hip strength and stability with good succcess. patient is able to complete all therex with proper technique following cuing, with good motivation throughout. PT will continue progression as able.    Personal Factors and Comorbidities  Comorbidity 3+;Age;Time since onset of injury/illness/exacerbation;Fitness;Social Background    Comorbidities  HTN, diabetes, chronic kidney disease, stroke    Examination-Activity Limitations  Bend;Caring for Others;Sleep;Lift;Sit;Stand;Stairs;Carry;Locomotion Level;Squat    Examination-Participation Restrictions  Cleaning;Community Activity;Meal Prep;Other    Stability/Clinical Decision Making  Evolving/Moderate complexity    Clinical Decision Making  Moderate    Rehab Potential  Fair    Clinical Impairments Affecting Rehab Potential  (-) age, potential difficulty with motivation; prior strokes, chronicity of pain, severity of pain    PT Frequency  2x / week    PT Duration  8 weeks    PT Treatment/Interventions  ADLs/Self Care Home Management;Electrical Stimulation;Moist Heat;Functional mobility training;Therapeutic activities;Therapeutic exercise;Neuromuscular re-education;Patient/family education;Manual techniques;Passive range of motion;Dry needling;Cryotherapy;Ultrasound;Taping;Gait training;Balance training    PT Next Visit Plan  increase tolerance for standing, stretching, increase tolerance for movement    PT Home Exercise Plan  supine windshield wipers (legs side to side x10 ea direction), supine  leg pull to chest hold for 30 sec x2 (thomas test position)    Consulted and Agree with Plan of Care  Patient       Patient will benefit from skilled therapeutic intervention in order to improve the following deficits and impairments:  Decreased activity tolerance, Decreased endurance, Decreased range of motion, Decreased strength, Improper body mechanics, Pain, Decreased balance, Decreased coordination, Decreased mobility, Difficulty walking, Impaired flexibility, Impaired tone, Postural dysfunction  Visit Diagnosis: Right-sided low back pain without sciatica, unspecified chronicity  Chronic left shoulder pain  Chronic right shoulder pain     Problem List Patient Active Problem List   Diagnosis Date Noted  . COPD, mild ([place]) 05/08/2019  . 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 Anemia in stage 4 chronic kidney disease ([place]) 05/08/2019  . Current moderate episode of major depressive disorder without prior episode ([place]) 05/07/2019  . Neuropathic pain 09/14/2016  . 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 Vitamin D deficiency 06/23/2016  . Stage III chronic kidney disease 03/23/2016  . Diabetes mellitus type 2, uncomplicated ([place]) [id number]  . Rheumatoid arthritis 03/18/2016  . Current use of long term anticoagulation (Plavix) 03/18/2016  . Hyperlipidemia associated with type 2 diabetes mellitus ([place]) 03/18/2016  . GERD (gastroesophageal reflux disease) 03/18/2016  . Essential hypertension with goal blood pressure less than 130/80 09/30/2015  . History of CVA (cerebrovascular accident) 04/02/2015   Shelton Silvas PT, DPT Shelton Silvas 05/18/2019, 10:40 AM  [facility] [address] [place], [address] Phone: [phone]   Fax:  [phone]  Name: Kelli Gutierrez MRN:  [id number] Date of Birth: 1950-04-26

## 2019-05-21 ENCOUNTER — Other Ambulatory Visit: Payer: Self-pay

## 2019-05-21 ENCOUNTER — Ambulatory Visit: Payer: Medicare Other | Admitting: Physical Therapy

## 2019-05-21 ENCOUNTER — Encounter: Payer: Self-pay | Admitting: Physical Therapy

## 2019-05-21 DIAGNOSIS — M545 Low back pain, unspecified: Secondary | ICD-10-CM

## 2019-05-21 NOTE — Therapy (Signed)
[place] [facility] [address], [place], [address] Phone: [phone]   Fax:  [phone]  Physical Therapy Treatment  Patient Details  Name: Kelli Gutierrez MRN: [id number] Date of Birth: [birth year] No data recorded  Encounter Date: 05/21/2019  PT End of Session - 05/21/19 0952    Visit Number  6    Number of Visits  17    Date for PT Re-Evaluation  06/27/19    PT Start Time  0947    PT Stop Time  1025    PT Time Calculation (min)  38 min    Activity Tolerance  Patient tolerated treatment well    Behavior During Therapy  [facility] for tasks assessed/performed       Past Medical History:  Diagnosis Date  . Acute CVA (cerebrovascular accident) ([place]) 12/17/2014  . Anemia   . Breast pain, right 06/01/2015  . Chronic kidney disease 12/2015  . CVA (cerebral infarction) 12/17/2014  . Diabetes mellitus without complication ([place])   . GERD (gastroesophageal reflux disease)   . Hyperlipidemia   . Hypertension   . Lumbar radiculitis 12/06/2013  . Meningitis   . Rheumatoid arthritis ([place])   . Stroke [facility])     Past Surgical History:  Procedure Laterality Date  . APPENDECTOMY    . CESAREAN SECTION     4  . CHOLECYSTECTOMY    . MIDDLE EAR SURGERY      There were no vitals filed for this visit.  Subjective Assessment - 05/21/19 0951    Subjective  Patient reports no LBP, that she is feeling better overall, that on Friday she was feeling "down" but is more optimistic today. Reports good consistency with HEP and that she has been walking more, which she is happy with.    Pertinent History  Pt is a 69 yo female with history of stroke, type 2 diabetes, HTN, high cholesterol, RA, and chronic kidney disease. Pt has c/c of LBP that started 2 months ago with insidious onset. Pt describes pain as achy that stays in LB, favoring the R side. At worst, pt rates pain at 7-8/10, at best and currently, pain is 3/10 that stays the same throughout  the day. Recently, LBP has subsided, but pt does not report any changes to her routine to account for decrease in pain. Aggravating factors for pain include bending, walking, sitting, and laying on R side with immediate onset. Pt does not report any alleviating factors. Pt states she stays at home and watches television most of the day. Pt lives in an apartment with her son who she provides care for due to his disability. Pt pain has sometimes interfered with ability to care for son. Pt denies changes in bowel/bladder, saddle paresthesia, unrelenting night pain, fever/night sweats, unexplained weight loss/gain, and numbness and tingling. Pt goal for PT is to reduce pain without the use of medications in order to perform ADLs efficiently.    Limitations  Sitting;Walking;House hold activities;Lifting;Standing    How long can you sit comfortably?  5 min    How long can you stand comfortably?  5 min    How long can you walk comfortably?  "not too far d/t SOB w/ COPD"    Diagnostic tests  MRI in hospital    Patient Stated Goals  decrease pain for ADLs         Ther-Ex Nustep L3 75mins seat setting 5, BUE 7 for gentle lumbar rotation and low impact  hip strengthening Lateral step down from 4in step 3x 11/18/08 each LE with max cuing and demo needed initially with good carry over following Reverse lunge to hip flex 3x 6/7 each LE with decent carry over from previous session, good compliance of cuing Squat with mat table forTC to tap bottom (mat table at 19in) w/ 10# DB in front3x 10 with good carry over of posture from last session Hooklying ER GTB 3x 10 with cuing for even movement bilat with good carry over Hooklying lower trunk roations x20 5 sec hold Hooklying butterfly stretch 2x 60sec holdd                          PT Education - 05/21/19 0952    Education provided  Yes    Education Details  therex form    Person(s) Educated  Patient    Methods   Explanation;Demonstration;Tactile cues;Verbal cues    Comprehension  Verbalized understanding;Returned demonstration;Verbal cues required;Tactile cues required       PT Short Term Goals - 05/02/19 1638      PT SHORT TERM GOAL #1   Title  In 4 weeks, pt will increase standing tolerance to 10 minutes without pain in LB in order to improve tolerance for ADL.    Baseline  05/02/2019: pt reports immediate pain when standing    Time  4    Period  Weeks    Status  New    Target Date  05/30/19      PT SHORT TERM GOAL #2   Title  Pt will be independent with HEP in order to improve strength and balance in order to decrease fall risk and improve function at home and work.    Baseline  05/02/19 HEP given    Time  4    Period  Weeks    Status  New        PT Long Term Goals - 05/02/19 1627      PT LONG TERM GOAL #1   Title  In 8 weeks, pt will increase tolerance to standing without pain for 15 minutes at a time in order to provide care for son and perform ADLs.    Baseline  05/02/2019 - immediate onset of pain in standing    Time  8    Period  Weeks    Status  New    Target Date  06/27/19      PT LONG TERM GOAL #2   Title  In 8 weeks, pt will perform transfers from supine to sitting and sitting to supine with an independent controlled motor pattern.    Baseline  05/02/19; modified independent, with subjective report of minA needed occassionally    Time  8    Period  Weeks    Status  New    Target Date  06/27/19      PT LONG TERM GOAL #3   Title  Patient will increase FOTO score to 61 to demonstrate predicted increase in functional mobility to complete ADLs    Baseline  05/02/19 42    Time  8    Period  Weeks    Status  New    Target Date  06/27/19      PT LONG TERM GOAL #4   Title  Pt will improve gross motor strength of bilat hips by 1 point in order to improve tolerance for activity.    Baseline  hip flexion 3/5 bilat, IR: 2/5,  ER 2/5, ABD (R 3/5, L 3+/5), ADD (3/5), EXT (3-/5)     Time  8    Period  Weeks    Status  New    Target Date  06/27/19            Plan - 05/21/19 0957    Clinical Impression Statement  Pt reports no pain today, allowing for increased therex progression with good success. Patient requires cuing for most therex for proper technique/muscle activation with good carry over of all cues and good motivation throughout session. Patient is improving ability to activate hip and core musculature, and reporting reduced pain overall. PT will continue progression as able.    Personal Factors and Comorbidities  Comorbidity 3+;Age;Time since onset of injury/illness/exacerbation;Fitness;Social Background    Comorbidities  HTN, diabetes, chronic kidney disease, stroke    Examination-Activity Limitations  Bend;Caring for Others;Sleep;Lift;Sit;Stand;Stairs;Carry;Locomotion Level;Squat    Examination-Participation Restrictions  Cleaning;Community Activity;Meal Prep;Other    Stability/Clinical Decision Making  Evolving/Moderate complexity    Clinical Decision Making  Moderate    Rehab Potential  Fair    Clinical Impairments Affecting Rehab Potential  (-) age, potential difficulty with motivation; prior strokes, chronicity of pain, severity of pain    PT Frequency  2x / week    PT Duration  8 weeks    PT Treatment/Interventions  ADLs/Self Care Home Management;Electrical Stimulation;Moist Heat;Functional mobility training;Therapeutic activities;Therapeutic exercise;Neuromuscular re-education;Patient/family education;Manual techniques;Passive range of motion;Dry needling;Cryotherapy;Ultrasound;Taping;Gait training;Balance training    PT Next Visit Plan  increase tolerance for standing, stretching, increase tolerance for movement    PT Home Exercise Plan  supine windshield wipers (legs side to side x10 ea direction), supine leg pull to chest hold for 30 sec x2 (thomas test position)    Consulted and Agree with Plan of Care  Patient       Patient will benefit  from skilled therapeutic intervention in order to improve the following deficits and impairments:  Decreased activity tolerance, Decreased endurance, Decreased range of motion, Decreased strength, Improper body mechanics, Pain, Decreased balance, Decreased coordination, Decreased mobility, Difficulty walking, Impaired flexibility, Impaired tone, Postural dysfunction  Visit Diagnosis: Right-sided low back pain without sciatica, unspecified chronicity     Problem List Patient Active Problem List   Diagnosis Date Noted  . COPD, mild ([place]) 05/08/2019  . 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 Anemia in stage 4 chronic kidney disease ([place]) 05/08/2019  . Current moderate episode of major depressive disorder without prior episode ([place]) 05/07/2019  . Neuropathic pain 09/14/2016  . 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 Vitamin D deficiency 06/23/2016  . Stage III chronic kidney disease 03/23/2016  . Diabetes mellitus type 2, uncomplicated ([place]) [id number]  . Rheumatoid arthritis 03/18/2016  . Current use of long term anticoagulation (Plavix) 03/18/2016  . Hyperlipidemia associated with type 2 diabetes mellitus ([place]) 03/18/2016  . GERD (gastroesophageal reflux disease) 03/18/2016  . Essential hypertension with goal blood pressure less than 130/80 09/30/2015  . History of CVA (cerebrovascular accident) 04/02/2015   Shelton Silvas PT, DPT Shelton Silvas 05/21/2019, 10:25 AM  [facility] [address], [place], [address] Phone: [phone]   Fax:  [phone]  Name: Kelli Gutierrez MRN: [id number] Date of Birth: [birth year]

## 2019-05-22 ENCOUNTER — Ambulatory Visit: Payer: Self-pay

## 2019-05-22 NOTE — Telephone Encounter (Signed)
Incoming call from  Patient with a complaint that she has been dizzy  And bloold sugar has been running high  Lately .  Has never been this high before.  Patient states that Dr.  Has decreased dosage of metformin.  Patient feels it need to go back to what it was.  .  This morning.  Blood sugar was 372,  260 fasting.   Patient would like to return call back from Dr. Kingsley Callander, today please.        Reason for Disposition . [1] MODERATE dizziness (e.g., vertigo; feels very unsteady, interferes with normal activities) AND [2] has been evaluated by physician for this  Answer Assessment - Initial Assessment Questions 1. DESCRIPTION: "Describe your dizziness."  2. VERTIGO: "Do you feel like either you or the room is spinning or tilting?"      *No Answer* 3. LIGHTHEADED: "Do you feel lightheaded?" (e.g., somewhat faint, woozy, weak upon standing)     *No Answer* 4. SEVERITY: "How bad is it?"  "Can you walk?"   - MILD - Feels unsteady but walking normally.   - MODERATE - Feels very unsteady when walking, but not falling; interferes with normal activities (e.g., school, work) .   - SEVERE - Unable to walk without falling (requires assistance).     mod 5. ONSET:  "When did the dizziness begin?"     6. AGGRAVATING FACTORS: "Does anything make it worse?" (e.g., standing, change in head position)     7. CAUSE: "What do you think is causing the dizziness?"    medication 8. RECURRENT SYMPTOM: "Have you had dizziness before?" If so, ask: "When was the last time?" "What happened that time?"     yes 9. OTHER SYMPTOMS: "Do you have any other symptoms?" (e.g., headache, weakness, numbness, vomiting, earache)    weakness 10. PREGNANCY: "Is there any chance you are pregnant?" "When was your last menstrual period?"       na  Protocols used: DIZZINESS - VERTIGO-A-AH

## 2019-05-23 ENCOUNTER — Encounter: Payer: Self-pay | Admitting: Family Medicine

## 2019-05-23 ENCOUNTER — Other Ambulatory Visit: Payer: Self-pay

## 2019-05-23 ENCOUNTER — Ambulatory Visit (INDEPENDENT_AMBULATORY_CARE_PROVIDER_SITE_OTHER): Payer: Medicare Other | Admitting: Family Medicine

## 2019-05-23 VITALS — BP 139/80 | HR 69 | Temp 96.8°F | Wt 144.0 lb

## 2019-05-23 DIAGNOSIS — R42 Dizziness and giddiness: Secondary | ICD-10-CM

## 2019-05-23 DIAGNOSIS — E1165 Type 2 diabetes mellitus with hyperglycemia: Secondary | ICD-10-CM | POA: Diagnosis not present

## 2019-05-23 DIAGNOSIS — I10 Essential (primary) hypertension: Secondary | ICD-10-CM | POA: Diagnosis not present

## 2019-05-23 MED ORDER — PIOGLITAZONE HCL 30 MG PO TABS: 30.0000 mg | ORAL_TABLET | Freq: Every day | ORAL | 3 refills | Status: DC

## 2019-05-23 MED ORDER — LISINOPRIL 40 MG PO TABS: 40.0000 mg | ORAL_TABLET | Freq: Every day | ORAL | 3 refills | Status: DC

## 2019-05-23 NOTE — Addendum Note (Signed)
Addended by: Virginia Crews on: 05/23/2019 04:57 PM   Modules accepted: Orders

## 2019-05-23 NOTE — Assessment & Plan Note (Addendum)
Previously well controlled Slightly elevated today She stopped amlodipine as she believed she was taking too many medications Will stop amlodipine and increase lisinopril to 40 mg daily to decrease pill burden Reviewed last metabolic panel Follow-up in 1 month

## 2019-05-23 NOTE — Progress Notes (Signed)
Patient: Kelli Gutierrez Female    DOB: 1950-05-22   69 y.o.   MRN: [id number] Visit Date: 05/23/2019  Today's Provider: Lavon Paganini, MD   Chief Complaint  Patient presents with  . Diabetes  . Dizziness   Subjective:     Dizziness This is a new problem. The problem occurs intermittently. The problem has been unchanged. Pertinent negatives include no fatigue, headaches, numbness or weakness.  Diabetes She presents for her follow-up diabetic visit. She has type 2 diabetes mellitus. Her disease course has been worsening. Hypoglycemia symptoms include dizziness. Pertinent negatives for hypoglycemia include no headaches, seizures, speech difficulty or tremors. Associated symptoms include blurred vision, polydipsia and polyphagia. Pertinent negatives for diabetes include no fatigue, no foot paresthesias, no foot ulcerations, no polyuria, no weakness and no weight loss. Her home blood glucose trend is increasing steadily.    She has had dizziness previously when BG is high.  Seems similar.  Also having polydipsia and polyphagia.    Allergies  Allergen Reactions  . Hydrocodone     Other reaction(s): Dizziness     Current Outpatient Medications:  .  aspirin 81 MG chewable tablet, Chew 81 mg by mouth daily., Disp: , Rfl:  .  atorvastatin (LIPITOR) 20 MG tablet, Take 1 tablet (20 mg total) by mouth daily., Disp: 90 tablet, Rfl: 3 .  clopidogrel (PLAVIX) 75 MG tablet, Take 1 tablet (75 mg total) by mouth daily., Disp: 90 tablet, Rfl: 3 .  hydroxychloroquine (PLAQUENIL) 200 MG tablet, Take 200 mg by mouth daily., Disp: , Rfl:  .  metoprolol succinate (TOPROL-XL) 25 MG 24 hr tablet, Take 25 mg by mouth daily., Disp: , Rfl:  .  pioglitazone (ACTOS) 30 MG tablet, Take 1 tablet (30 mg total) by mouth daily., Disp: 30 tablet, Rfl: 3 .  sitaGLIPtin (JANUVIA) 25 MG tablet, Take 1 tablet (25 mg total) by mouth daily., Disp: 30 tablet, Rfl: 2 .  Vitamin D, Ergocalciferol, (DRISDOL) 1.25 MG  (50000 UNIT) CAPS capsule, Take 1 capsule (50,000 Units total) by mouth every 7 (seven) days., Disp: 12 capsule, Rfl: 0 .  amLODipine (NORVASC) 5 MG tablet, Take 5 mg by mouth daily., Disp: , Rfl:  .  lisinopril (ZESTRIL) 40 MG tablet, Take 1 tablet (40 mg total) by mouth daily., Disp: 90 tablet, Rfl: 3 .  traMADol (ULTRAM) 50 MG tablet, Take 1 tablet (50 mg total) by mouth every 6 (six) hours as needed. (Patient not taking: Reported on 05/10/2019), Disp: 20 tablet, Rfl: 0  Review of Systems  Constitutional: Negative.  Negative for fatigue and weight loss.  HENT: Negative.   Eyes: Positive for blurred vision.  Respiratory: Negative.   Cardiovascular: Negative.   Endocrine: Positive for polydipsia and polyphagia. Negative for polyuria.  Neurological: Positive for dizziness and light-headedness. Negative for tremors, seizures, facial asymmetry, speech difficulty, weakness, numbness and headaches.    Social History   Tobacco Use  . Smoking status: Never Smoker  . Smokeless tobacco: Never Used  Substance Use Topics  . Alcohol use: No      Objective:   BP 139/80 (BP Location: Left Arm, Patient Position: Sitting, Cuff Size: Normal)   Pulse 69   Temp (!) 96.8 F (36 C) (Temporal)   Wt 144 lb (65.3 kg)   BMI 26.34 kg/m  Vitals:   05/23/19 1328  BP: 139/80  Pulse: 69  Temp: (!) 96.8 F (36 C)  TempSrc: Temporal  Weight: 144 lb (65.3 kg)  Body  mass index is 26.34 kg/m.   Physical Exam Vitals reviewed.  Constitutional:      General: She is not in acute distress.    Appearance: Normal appearance. She is well-developed. She is not diaphoretic.  HENT:     Head: Normocephalic and atraumatic.  Eyes:     General: No scleral icterus.    Conjunctiva/sclera: Conjunctivae normal.  Neck:     Thyroid: No thyromegaly.  Cardiovascular:     Rate and Rhythm: Normal rate and regular rhythm.     Pulses: Normal pulses.     Heart sounds: Normal heart sounds. No murmur.  Pulmonary:      Effort: Pulmonary effort is normal. No respiratory distress.     Breath sounds: Normal breath sounds. No wheezing, rhonchi or rales.  Musculoskeletal:     Cervical back: Neck supple.     Right lower leg: No edema.     Left lower leg: No edema.  Lymphadenopathy:     Cervical: No cervical adenopathy.  Skin:    General: Skin is warm and dry.     Capillary Refill: Capillary refill takes less than 2 seconds.     Findings: No rash.  Neurological:     Mental Status: She is alert and oriented to person, place, and time. Mental status is at baseline.  Psychiatric:        Mood and Affect: Mood normal.        Behavior: Behavior normal.      No results found for any visits on 05/23/19.     Assessment & Plan    Problem List Items Addressed This Visit      Cardiovascular and Mediastinum   Essential hypertension with goal blood pressure less than 130/80    Previously well controlled Slightly elevated today She stopped amlodipine as she believed she was taking too many medications Will stop amlodipine and increase lisinopril to 40 mg daily to decrease pill burden Reviewed last metabolic panel Follow-up in 1 month      Relevant Medications   metoprolol succinate (TOPROL-XL) 25 MG 24 hr tablet   lisinopril (ZESTRIL) 40 MG tablet     Endocrine   Diabetes mellitus type 2, uncomplicated (HCC) - Primary    Chronic and uncontrolled We again discussed that given her significantly reduced GFR, there are many medications that she cannot take We will continue Januvia 25 mg daily, which is her max dose given her GFR We will continue to avoid SGLT2 inhibitors and Metformin Increase Actos to 30 mg daily We had discussed risks of Actos at previous visit-patient does not have heart failure, so we will continue use Could consider GLP-1 therapy in the future We did discuss that she will likely need to be on insulin in the future, but she is very hesitant to try this as she is very scared of sticking  herself with a needle-she may be able to get her daughter to inject a GLP-1 once weekly if we did decide to go with this She is on ACE inhibitor and statin Follow-up in 1 month and further dose titrate as indicated Advised to bring blood glucose log with her to next visit      Relevant Medications   pioglitazone (ACTOS) 30 MG tablet   lisinopril (ZESTRIL) 40 MG tablet     Other   Dizziness    Likely related to elevated blood glucose She has had this previously when her blood sugar has been high She is neuro intact at her baseline  today with residual left-sided deficits from previous CVA Return precautions discussed         Follow-up in 1 month as scheduled  The entirety of the information documented in the History of Present Illness, Review of Systems and Physical Exam were personally obtained by me. Portions of this information were initially documented by Ashley Royalty, CMA and reviewed by me for thoroughness and accuracy.    Ahlijah Raia, Dionne Bucy, MD MPH [facility]

## 2019-05-23 NOTE — Telephone Encounter (Signed)
She should be off of Metformin.  These medications were changed due to her significant kidney disease.  We can schedule OV (virtual or in person) to discuss dizziness and high blood sugars and make some changes.

## 2019-05-23 NOTE — Assessment & Plan Note (Signed)
Likely related to elevated blood glucose She has had this previously when her blood sugar has been high She is neuro intact at her baseline today with residual left-sided deficits from previous CVA Return precautions discussed

## 2019-05-23 NOTE — Assessment & Plan Note (Signed)
Chronic and uncontrolled We again discussed that given her significantly reduced GFR, there are many medications that she cannot take We will continue Januvia 25 mg daily, which is her max dose given her GFR We will continue to avoid SGLT2 inhibitors and Metformin Increase Actos to 30 mg daily We had discussed risks of Actos at previous visit-patient does not have heart failure, so we will continue use Could consider GLP-1 therapy in the future We did discuss that she will likely need to be on insulin in the future, but she is very hesitant to try this as she is very scared of sticking herself with a needle-she may be able to get her daughter to inject a GLP-1 once weekly if we did decide to go with this She is on ACE inhibitor and statin Follow-up in 1 month and further dose titrate as indicated Advised to bring blood glucose log with her to next visit

## 2019-05-25 ENCOUNTER — Other Ambulatory Visit: Payer: Self-pay

## 2019-05-25 ENCOUNTER — Ambulatory Visit: Payer: Medicare Other | Admitting: Physical Therapy

## 2019-05-25 ENCOUNTER — Encounter: Payer: Self-pay | Admitting: Physical Therapy

## 2019-05-25 DIAGNOSIS — M545 Low back pain, unspecified: Secondary | ICD-10-CM

## 2019-05-25 NOTE — Therapy (Addendum)
[facility] [address] [place], [address] Phone: [phone]   Fax:  [phone]  Physical Therapy Treatment  Patient Details  Name: Kelli Gutierrez MRN: [id number] Date of Birth: 1950/09/27 No data recorded  Encounter Date: 05/25/2019  PT End of Session - 05/25/19 1022    Visit Number  7    Number of Visits  17    Date for PT Re-Evaluation  06/27/19    PT Start Time  0935    PT Stop Time  1015    PT Time Calculation (min)  40 min    Activity Tolerance  Patient tolerated treatment well    Behavior During Therapy  [facility] for tasks assessed/performed       Past Medical History:  Diagnosis Date  . Acute CVA (cerebrovascular accident) ([place]) 12/17/2014  . Anemia   . Breast pain, right 06/01/2015  . Chronic kidney disease 12/2015  . CVA (cerebral infarction) 12/17/2014  . Diabetes mellitus without complication ([place])   . GERD (gastroesophageal reflux disease)   . Hyperlipidemia   . Hypertension   . Lumbar radiculitis 12/06/2013  . Meningitis   . Rheumatoid arthritis ([place])   . Stroke [facility])     Past Surgical History:  Procedure Laterality Date  . APPENDECTOMY    . CESAREAN SECTION     4  . CHOLECYSTECTOMY    . MIDDLE EAR SURGERY      There were no vitals filed for this visit.  Subjective Assessment - 05/25/19 0937    Subjective  Pt reports no pain in the LB today. HEP going well and trying to walk more around the house.    Pertinent History  Pt is a 69 yo female with history of stroke, type 2 diabetes, HTN, high cholesterol, RA, and chronic kidney disease. Pt has c/c of LBP that started 2 months ago with insidious onset. Pt describes pain as achy that stays in LB, favoring the R side. At worst, pt rates pain at 7-8/10, at best and currently, pain is 3/10 that stays the same throughout the day. Recently, LBP has subsided, but pt does not report any changes to her routine to account for decrease in pain.  Aggravating factors for pain include bending, walking, sitting, and laying on R side with immediate onset. Pt does not report any alleviating factors. Pt states she stays at home and watches television most of the day. Pt lives in an apartment with her son who she provides care for due to his disability. Pt pain has sometimes interfered with ability to care for son. Pt denies changes in bowel/bladder, saddle paresthesia, unrelenting night pain, fever/night sweats, unexplained weight loss/gain, and numbness and tingling. Pt goal for PT is to reduce pain without the use of medications in order to perform ADLs efficiently.    Limitations  Sitting;Walking;House hold activities;Lifting;Standing    How long can you sit comfortably?  5 min    How long can you stand comfortably?  5 min    How long can you walk comfortably?  "not too far d/t SOB w/ COPD"    Diagnostic tests  MRI in hospital    Patient Stated Goals  decrease pain for ADLs       THEREX -Nustep L3 seat 5 with UE 6 for 5 mins for gentle strengthening -Standing slider in 1/2 circles with light touch on bar with one UE 3x12 ea direction with heavy cueing for keeping knee in slight  flexion -Reverse lunge to hip flexion 3x7,6,5 ea leg with tactile cues for knee flexion and driving through posterior forefoot -Squat to mat table 19" 10# 3x8, 10 with cueing for complete hip extension at top -Supine bridge marching 3x4 ea leg with cueing to keep hips up entire time; good carryover  -Supine one hip flexion, other hip extended x30 sec ea -Lumbar trunk rotations in supine x6 ea direction                       PT Education - 05/25/19 1022    Education provided  Yes    Education Details  therex form    Person(s) Educated  Patient    Methods  Explanation;Tactile cues;Verbal cues;Demonstration    Comprehension  Verbalized understanding;Returned demonstration       PT Short Term Goals - 05/02/19 1638      PT SHORT TERM GOAL #1    Title  In 4 weeks, pt will increase standing tolerance to 10 minutes without pain in LB in order to improve tolerance for ADL.    Baseline  05/02/2019: pt reports immediate pain when standing    Time  4    Period  Weeks    Status  New    Target Date  05/30/19      PT SHORT TERM GOAL #2   Title  Pt will be independent with HEP in order to improve strength and balance in order to decrease fall risk and improve function at home and work.    Baseline  05/02/19 HEP given    Time  4    Period  Weeks    Status  New        PT Long Term Goals - 05/02/19 1627      PT LONG TERM GOAL #1   Title  In 8 weeks, pt will increase tolerance to standing without pain for 15 minutes at a time in order to provide care for son and perform ADLs.    Baseline  05/02/2019 - immediate onset of pain in standing    Time  8    Period  Weeks    Status  New    Target Date  06/27/19      PT LONG TERM GOAL #2   Title  In 8 weeks, pt will perform transfers from supine to sitting and sitting to supine with an independent controlled motor pattern.    Baseline  05/02/19; modified independent, with subjective report of minA needed occassionally    Time  8    Period  Weeks    Status  New    Target Date  06/27/19      PT LONG TERM GOAL #3   Title  Patient will increase FOTO score to 61 to demonstrate predicted increase in functional mobility to complete ADLs    Baseline  05/02/19 42    Time  8    Period  Weeks    Status  New    Target Date  06/27/19      PT LONG TERM GOAL #4   Title  Pt will improve gross motor strength of bilat hips by 1 point in order to improve tolerance for activity.    Baseline  hip flexion 3/5 bilat, IR: 2/5, ER 2/5, ABD (R 3/5, L 3+/5), ADD (3/5), EXT (3-/5)    Time  8    Period  Weeks    Status  New    Target Date  06/27/19  Plan - 05/25/19 1001    Clinical Impression Statement  Pt was 5 minutes late to session due to weather. PT progressed strength therex today  with good carryover from pt. Pt requires cueing during dynamic strength, but has good ability to correct. Pt is making improvement with strength, but can still benefit from continued rehab for endurance and power. PT will continue with progressions as needed.    Personal Factors and Comorbidities  Comorbidity 3+;Age;Time since onset of injury/illness/exacerbation;Fitness;Social Background    Comorbidities  HTN, diabetes, chronic kidney disease, stroke    Examination-Activity Limitations  Bend;Caring for Others;Sleep;Lift;Sit;Stand;Stairs;Carry;Locomotion Level;Squat    Examination-Participation Restrictions  Cleaning;Community Activity;Meal Prep;Other    Stability/Clinical Decision Making  Evolving/Moderate complexity    Clinical Decision Making  Moderate    Rehab Potential  Fair    Clinical Impairments Affecting Rehab Potential  (-) age, potential difficulty with motivation; prior strokes, chronicity of pain, severity of pain    PT Frequency  2x / week    PT Duration  8 weeks    PT Treatment/Interventions  ADLs/Self Care Home Management;Electrical Stimulation;Moist Heat;Functional mobility training;Therapeutic activities;Therapeutic exercise;Neuromuscular re-education;Patient/family education;Manual techniques;Passive range of motion;Dry needling;Cryotherapy;Ultrasound;Taping;Gait training;Balance training    PT Next Visit Plan  increase tolerance for standing, stretching, increase tolerance for movement    PT Home Exercise Plan  supine windshield wipers (legs side to side x10 ea direction), supine leg pull to chest hold for 30 sec x2 (thomas test position)    Consulted and Agree with Plan of Care  Patient       Patient will benefit from skilled therapeutic intervention in order to improve the following deficits and impairments:  Decreased activity tolerance, Decreased endurance, Decreased range of motion, Decreased strength, Improper body mechanics, Pain, Decreased balance, Decreased  coordination, Decreased mobility, Difficulty walking, Impaired flexibility, Impaired tone, Postural dysfunction  Visit Diagnosis: Right-sided low back pain without sciatica, unspecified chronicity     Problem List Patient Active Problem List   Diagnosis Date Noted  . Dizziness 05/23/2019  . COPD, mild ([place]) 05/08/2019  . 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 ([place]) 05/08/2019  . Anemia in stage 4 chronic kidney disease ([place]) 05/08/2019  . Current moderate episode of major depressive disorder without prior episode ([place]) 05/07/2019  . Neuropathic pain 09/14/2016  . 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 Vitamin D deficiency 06/23/2016  . Stage III chronic kidney disease 03/23/2016  . Diabetes mellitus type 2, uncomplicated ([place]) [id number]  . Rheumatoid arthritis 03/18/2016  . Current use of long term anticoagulation (Plavix) 03/18/2016  . Hyperlipidemia associated with type 2 diabetes mellitus ([place]) 03/18/2016  . GERD (gastroesophageal reflux disease) 03/18/2016  . Essential hypertension with goal blood pressure less than 130/80 09/30/2015  . History of CVA (cerebrovascular accident) 04/02/2015   Shelton Silvas PT, DPT Ivin Booty, SPT Shelton Silvas 05/25/2019, 10:48 AM  [facility] [address], [place], [address] Phone: [phone]   Fax:  [phone]  Name: SHATANYA CLAYMAN MRN: [id number] Date of Birth: 1950-09-21

## 2019-05-28 ENCOUNTER — Ambulatory Visit: Payer: Self-pay | Admitting: *Deleted

## 2019-05-28 ENCOUNTER — Other Ambulatory Visit: Payer: Self-pay

## 2019-05-28 ENCOUNTER — Ambulatory Visit: Payer: Self-pay

## 2019-05-28 ENCOUNTER — Encounter: Payer: Self-pay | Admitting: Physical Therapy

## 2019-05-28 ENCOUNTER — Ambulatory Visit: Payer: Medicare Other | Admitting: Physical Therapy

## 2019-05-28 DIAGNOSIS — M545 Low back pain, unspecified: Secondary | ICD-10-CM

## 2019-05-28 DIAGNOSIS — M25512 Pain in left shoulder: Secondary | ICD-10-CM

## 2019-05-28 DIAGNOSIS — G8929 Other chronic pain: Secondary | ICD-10-CM

## 2019-05-28 NOTE — Therapy (Signed)
[facility] [address] [place], [address] Phone: [phone]   Fax:  [phone]  Physical Therapy Treatment  Patient Details  Name: Kelli Gutierrez MRN: [id number] Date of Birth: 10-Oct-1950 No data recorded  Encounter Date: 05/28/2019  PT End of Session - 05/28/19 0951    Visit Number  8    Number of Visits  17    Date for PT Re-Evaluation  06/27/19    PT Start Time  0945    PT Stop Time  1045    PT Time Calculation (min)  60 min    Activity Tolerance  Patient tolerated treatment well    Behavior During Therapy  [facility] for tasks assessed/performed       Past Medical History:  Diagnosis Date  . Acute CVA (cerebrovascular accident) ([place]) 12/17/2014  . Anemia   . Breast pain, right 06/01/2015  . Chronic kidney disease 12/2015  . CVA (cerebral infarction) 12/17/2014  . Diabetes mellitus without complication ([place])   . GERD (gastroesophageal reflux disease)   . Hyperlipidemia   . Hypertension   . Lumbar radiculitis 12/06/2013  . Meningitis   . Rheumatoid arthritis ([place])   . Stroke [facility])     Past Surgical History:  Procedure Laterality Date  . APPENDECTOMY    . CESAREAN SECTION     4  . CHOLECYSTECTOMY    . MIDDLE EAR SURGERY      There were no vitals filed for this visit.    THEREX -Nustep L3 seat 5 with UE 6 for 3 mins; L4 22mins for gentle strengthening; SPM above 65 throughout -Squat to mat table 19" 10# 3x 10 with min cuing for posture with good carry over  -Reverse lunge to hip flexion on foam 3x 8 ea leg with min verbal cues for knee flexion and driving through posterior forefoot Following these exercises patient reports she feels SOB, O2 is 99% and this feeling subsides after a couple minutes. Patient reports since being taken off her metformin (a couple months ago) she has felt "funny" and has had some increased fatigue and dizzy spells.  BP taken at this time 150/50  Patient rests seated with  water BP = 170/55  HR = 79 Education during rest given on heart healthy diet, to reduce salt intake and education to reach out to PCP about these symptoms for medication management.                         PT Education - 05/28/19 0951    Education provided  Yes    Education Details  therex form    Person(s) Educated  Patient    Methods  Explanation;Demonstration;Verbal cues    Comprehension  Verbalized understanding;Returned demonstration;Verbal cues required       PT Short Term Goals - 05/02/19 1638      PT SHORT TERM GOAL #1   Title  In 4 weeks, pt will increase standing tolerance to 10 minutes without pain in LB in order to improve tolerance for ADL.    Baseline  05/02/2019: pt reports immediate pain when standing    Time  4    Period  Weeks    Status  New    Target Date  05/30/19      PT SHORT TERM GOAL #2   Title  Pt will be independent with HEP in order to improve strength and balance in order to decrease fall  risk and improve function at home and work.    Baseline  05/02/19 HEP given    Time  4    Period  Weeks    Status  New        PT Long Term Goals - 05/02/19 1627      PT LONG TERM GOAL #1   Title  In 8 weeks, pt will increase tolerance to standing without pain for 15 minutes at a time in order to provide care for son and perform ADLs.    Baseline  05/02/2019 - immediate onset of pain in standing    Time  8    Period  Weeks    Status  New    Target Date  06/27/19      PT LONG TERM GOAL #2   Title  In 8 weeks, pt will perform transfers from supine to sitting and sitting to supine with an independent controlled motor pattern.    Baseline  05/02/19; modified independent, with subjective report of minA needed occassionally    Time  8    Period  Weeks    Status  New    Target Date  06/27/19      PT LONG TERM GOAL #3   Title  Patient will increase FOTO score to 61 to demonstrate predicted increase in functional mobility to complete ADLs     Baseline  05/02/19 42    Time  8    Period  Weeks    Status  New    Target Date  06/27/19      PT LONG TERM GOAL #4   Title  Pt will improve gross motor strength of bilat hips by 1 point in order to improve tolerance for activity.    Baseline  hip flexion 3/5 bilat, IR: 2/5, ER 2/5, ABD (R 3/5, L 3+/5), ADD (3/5), EXT (3-/5)    Time  8    Period  Weeks    Status  New    Target Date  06/27/19            Plan - 05/28/19 0953    Clinical Impression Statement  PT continued therex progression for core and hip stabilization with carry over to neutral posture with functional activity with good carry over following demonstration and cuing. Following therex patient reports symptoms of SOB and fatigue with vitals obtained and BP = 150/50. PT allowed patient to rest and hydrate with education given on heart healthy diet and exercise and need for PCP intervention for medication management. BP taken subsequently 170/55. PT gave patient option to go to hospital, and patient called PCP with PT to manage appointments. PT will follow up as able.  PT will continue progression as able.    Personal Factors and Comorbidities  Comorbidity 3+;Age;Time since onset of injury/illness/exacerbation;Fitness;Social Background    Comorbidities  HTN, diabetes, chronic kidney disease, stroke    Examination-Activity Limitations  Bend;Caring for Others;Sleep;Lift;Sit;Stand;Stairs;Carry;Locomotion Level;Squat    Examination-Participation Restrictions  Cleaning;Community Activity;Meal Prep;Other    Stability/Clinical Decision Making  Evolving/Moderate complexity    Clinical Decision Making  Moderate    Rehab Potential  Fair    Clinical Impairments Affecting Rehab Potential  (-) age, potential difficulty with motivation; prior strokes, chronicity of pain, severity of pain    PT Frequency  2x / week    PT Duration  8 weeks    PT Treatment/Interventions  ADLs/Self Care Home Management;Electrical Stimulation;Moist  Heat;Functional mobility training;Therapeutic activities;Therapeutic exercise;Neuromuscular re-education;Patient/family education;Manual techniques;Passive range of motion;Dry  needling;Cryotherapy;Ultrasound;Taping;Gait training;Balance training    PT Next Visit Plan  increase tolerance for standing, stretching, increase tolerance for movement    PT Home Exercise Plan  supine windshield wipers (legs side to side x10 ea direction), supine leg pull to chest hold for 30 sec x2 (thomas test position)    Consulted and Agree with Plan of Care  Patient       Patient will benefit from skilled therapeutic intervention in order to improve the following deficits and impairments:  Decreased activity tolerance, Decreased endurance, Decreased range of motion, Decreased strength, Improper body mechanics, Pain, Decreased balance, Decreased coordination, Decreased mobility, Difficulty walking, Impaired flexibility, Impaired tone, Postural dysfunction  Visit Diagnosis: Right-sided low back pain without sciatica, unspecified chronicity  Chronic left shoulder pain  Chronic right shoulder pain     Problem List Patient Active Problem List   Diagnosis Date Noted  . Dizziness 05/23/2019  . COPD, mild ([place]) 05/08/2019  . 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 Anemia in stage 4 chronic kidney disease ([place]) 05/08/2019  . Current moderate episode of major depressive disorder without prior episode ([place]) 05/07/2019  . Neuropathic pain 09/14/2016  . 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 Vitamin D deficiency 06/23/2016  . Stage III chronic kidney disease 03/23/2016  . Diabetes mellitus type 2, uncomplicated ([place]) [id number]  . Rheumatoid arthritis 03/18/2016  . Current use of long term anticoagulation (Plavix) 03/18/2016  . Hyperlipidemia associated with type 2 diabetes  mellitus ([place]) 03/18/2016  . GERD (gastroesophageal reflux disease) 03/18/2016  . Essential hypertension with goal blood pressure less than 130/80 09/30/2015  . History of CVA (cerebrovascular accident) 04/02/2015   Shelton Silvas PT, DPT Shelton Silvas 05/28/2019, 10:36 AM  [facility] [address], [place], [address] Phone: [phone]   Fax:  [phone]  Name: DANEISHA WAMBACH MRN: [id number] Date of Birth: 05/21/1950

## 2019-05-28 NOTE — Telephone Encounter (Signed)
Incoming call from Patient with complaint of feeling dizzy.  Patient was at physical therapy.   Blood pressure was 170/55 .  Patient state that she has a peending follow appointment in March. Want to know if she should come  Or wait til  March.  Request a return phone call Please.  Patient awaits return phone call.           Reason for Disposition . [1] MODERATE dizziness (e.g., interferes with normal activities) AND [2] has been evaluated by physician for this  Answer Assessment - Initial Assessment Questions 1. DESCRIPTION: "Describe your dizziness."      2. LIGHTHEADED: "Do you feel lightheaded?" (e.g., somewhat faint, woozy, weak upon standing)     *No Answer* 3. VERTIGO: "Do you feel like either you or the room is spinning or tilting?" (i.e. vertigo)     *No Answer* 4. SEVERITY: "How bad is it?"  "Do you feel like you are going to faint?" "Can you stand and walk?"   - MILD - walking normally   - MODERATE - interferes with normal activities (e.g., work, school)    - SEVERE - unable to stand, requires support to walk, feels like passing out now.       5. ONSET:  "When did the dizziness begin?"    One hour ago.   6. AGGRAVATING FACTORS: "Does anything make it worse?" (e.g., standing, change in head position)   denies 7. HEART RATE: "Can you tell me your heart rate?" "How many beats in 15 seconds?"  (Note: not all patients can do this)      70-79 8. CAUSE: "What do you think is causing the dizziness?"     *No Answer* 9. RECURRENT SYMPTOM: "Have you had dizziness before?" If so, ask: "When was the last time?" "What happened that time?"     yes 10. OTHER SYMPTOMS: "Do you have any other symptoms?" (e.g., fever, chest pain, vomiting, diarrhea, bleeding)       11. PREGNANCY: "Is there any chance you are pregnant?" "When was your last menstrual period?"      na  Protocols used: DIZZINESS [facility]

## 2019-05-28 NOTE — Chronic Care Management (AMB) (Signed)
  Chronic Care Management   Social Work Note  05/28/2019 Name: Kelli Gutierrez MRN: [id number] DOB: Aug 17, 1950  Kelli Gutierrez is a 69 y.o. year old female who sees Bacigalupo, Dionne Bucy, MD for primary care. The CCM team was consulted for assistance with Mental Health Counseling and Resources.   Phone call to patient to assess for mental health needs. Patient requested a call back stating that she had left physical therapy today feeling dizzy and her blood pressure reading was high. Patient's doctor's office called during this social worker's call. This social worker will call patient back within 5-7 business days.  Outpatient Encounter Medications as of 05/28/2019  Medication Sig Note  . aspirin 81 MG chewable tablet Chew 81 mg by mouth daily.   Marland Kitchen atorvastatin (LIPITOR) 20 MG tablet Take 1 tablet (20 mg total) by mouth daily.   . clopidogrel (PLAVIX) 75 MG tablet Take 1 tablet (75 mg total) by mouth daily.   . hydroxychloroquine (PLAQUENIL) 200 MG tablet Take 200 mg by mouth daily.   Marland Kitchen lisinopril (ZESTRIL) 40 MG tablet Take 1 tablet (40 mg total) by mouth daily.   . metoprolol succinate (TOPROL-XL) 25 MG 24 hr tablet Take 25 mg by mouth daily.   . pioglitazone (ACTOS) 30 MG tablet Take 1 tablet (30 mg total) by mouth daily.   . sitaGLIPtin (JANUVIA) 25 MG tablet Take 1 tablet (25 mg total) by mouth daily.   . traMADol (ULTRAM) 50 MG tablet Take 1 tablet (50 mg total) by mouth every 6 (six) hours as needed. (Patient not taking: Reported on 05/10/2019)   . Vitamin D, Ergocalciferol, (DRISDOL) 1.25 MG (50000 UNIT) CAPS capsule Take 1 capsule (50,000 Units total) by mouth every 7 (seven) days. 05/10/2019: Tuesdays   No facility-administered encounter medications on file as of 05/28/2019.    Goals Addressed   None     Follow Up Plan: SW will follow up with patient by phone over the next 5-7 business days   [place], [place] Worker  [facility] Care  Management [phone]

## 2019-05-30 ENCOUNTER — Telehealth: Payer: Medicare Other | Admitting: *Deleted

## 2019-05-30 ENCOUNTER — Other Ambulatory Visit: Payer: Self-pay

## 2019-05-30 ENCOUNTER — Encounter: Payer: Self-pay | Admitting: Family Medicine

## 2019-05-30 ENCOUNTER — Ambulatory Visit (INDEPENDENT_AMBULATORY_CARE_PROVIDER_SITE_OTHER): Payer: Medicare Other | Admitting: Family Medicine

## 2019-05-30 ENCOUNTER — Ambulatory Visit: Payer: Self-pay | Admitting: *Deleted

## 2019-05-30 ENCOUNTER — Encounter: Payer: Self-pay | Admitting: *Deleted

## 2019-05-30 VITALS — BP 132/72 | HR 79 | Temp 96.6°F | Resp 16 | Wt 145.0 lb

## 2019-05-30 DIAGNOSIS — E1165 Type 2 diabetes mellitus with hyperglycemia: Secondary | ICD-10-CM

## 2019-05-30 DIAGNOSIS — I10 Essential (primary) hypertension: Secondary | ICD-10-CM

## 2019-05-30 DIAGNOSIS — R0600 Dyspnea, unspecified: Secondary | ICD-10-CM | POA: Diagnosis not present

## 2019-05-30 DIAGNOSIS — R0609 Other forms of dyspnea: Secondary | ICD-10-CM

## 2019-05-30 LAB — GLUCOSE, POCT (MANUAL RESULT ENTRY): POC Glucose: 242 mg/dl — AB (ref 70–99)

## 2019-05-30 NOTE — Chronic Care Management (AMB) (Signed)
   Chronic Care Management   Unsuccessful Call Note 05/30/2019 Name: Kelli Gutierrez MRN: [id number] DOB: [birth year]  Patient  is a 69  year old female who sees Lavon Paganini, MD for primary care. Lavon Paganini, MD asked the CCM team to consult the patient for Mental Health Counseling and Resources.    This social worker was unable to reach patient via telephone today. I was not able to leave a HIPAA compliant voicemail asking patient to return my cal as their was no Designer, jewellery. (unsuccessful outreach #1).   Plan: Will follow-up within 7 business days via telephone.      Elliot Gurney, [place] Worker  [facility] Care Management [phone]

## 2019-05-30 NOTE — Progress Notes (Signed)
Patient: Kelli Gutierrez Female    DOB: 02/26/1951   69 y.o.   MRN: [id number] Visit Date: 05/30/2019  Today's Provider: Lavon Paganini, MD   Chief Complaint  Patient presents with  . Dizziness   Subjective:    I [place] S. Dimas, CMA, am acting as scribe for Lavon Paganini, MD.  HPI Patient here today C/O dizziness, fatigue and shortness of breath on Monday during physical therapy. Patient reports that's physical therapist checked her BP and reports it was elevated 150/50. Patient was given time to rest and hydrate and BP was checked again and was 170/50. Patient reports she went home and checked random blood sugar and reports it was 309. Patient reports that Tuesday her fasting blood sugar was 208. Patient reports that she is not feeling dizzy or any other symptom. Patient reports that she has been feeling like her sugar is out of control since stopping Metformin.    Since that episode of physical therapy, she has not had any further issues.  Wt Readings from Last 3 Encounters:  05/30/19 145 lb (65.8 kg)  05/23/19 144 lb (65.3 kg)  05/07/19 139 lb (63 kg)   Lab Results  Component Value Date   HGBA1C 8.1 (H) 05/07/2019     Allergies  Allergen Reactions  . Hydrocodone     Other reaction(s): Dizziness     Current Outpatient Medications:  .  aspirin 81 MG chewable tablet, Chew 81 mg by mouth daily., Disp: , Rfl:  .  atorvastatin (LIPITOR) 20 MG tablet, Take 1 tablet (20 mg total) by mouth daily., Disp: 90 tablet, Rfl: 3 .  clopidogrel (PLAVIX) 75 MG tablet, Take 1 tablet (75 mg total) by mouth daily., Disp: 90 tablet, Rfl: 3 .  hydroxychloroquine (PLAQUENIL) 200 MG tablet, Take 200 mg by mouth daily., Disp: , Rfl:  .  lisinopril (ZESTRIL) 40 MG tablet, Take 1 tablet (40 mg total) by mouth daily., Disp: 90 tablet, Rfl: 3 .  metoprolol succinate (TOPROL-XL) 25 MG 24 hr tablet, Take 25 mg by mouth daily., Disp: , Rfl:  .  pioglitazone (ACTOS) 30 MG tablet, Take 1  tablet (30 mg total) by mouth daily., Disp: 30 tablet, Rfl: 3 .  sitaGLIPtin (JANUVIA) 25 MG tablet, Take 1 tablet (25 mg total) by mouth daily., Disp: 30 tablet, Rfl: 2 .  Vitamin D, Ergocalciferol, (DRISDOL) 1.25 MG (50000 UNIT) CAPS capsule, Take 1 capsule (50,000 Units total) by mouth every 7 (seven) days., Disp: 12 capsule, Rfl: 0  Review of Systems  Constitutional: Negative for appetite change and fatigue.  Respiratory: Negative for cough, shortness of breath and wheezing.   Cardiovascular: Negative for chest pain and palpitations.  Neurological: Negative for dizziness.    Social History   Tobacco Use  . Smoking status: Never Smoker  . Smokeless tobacco: Never Used  Substance Use Topics  . Alcohol use: No      Objective:   BP 132/72 (BP Location: Left Arm, Patient Position: Sitting, Cuff Size: Normal)   Pulse 79   Temp (!) 96.6 F (35.9 C) (Temporal)   Resp 16   Wt 145 lb (65.8 kg)   BMI 26.52 kg/m  Vitals:   05/30/19 1405  BP: 132/72  Pulse: 79  Resp: 16  Temp: (!) 96.6 F (35.9 C)  TempSrc: Temporal  Weight: 145 lb (65.8 kg)  Body mass index is 26.52 kg/m.   Physical Exam Vitals reviewed.  Constitutional:      General:  She is not in acute distress.    Appearance: Normal appearance. She is well-developed. She is not diaphoretic.  HENT:     Head: Normocephalic and atraumatic.  Eyes:     General: No scleral icterus.    Conjunctiva/sclera: Conjunctivae normal.  Neck:     Thyroid: No thyromegaly.  Cardiovascular:     Rate and Rhythm: Normal rate and regular rhythm.     Pulses: Normal pulses.     Heart sounds: Normal heart sounds. No murmur.  Pulmonary:     Effort: Pulmonary effort is normal. No respiratory distress.     Breath sounds: Normal breath sounds. No wheezing, rhonchi or rales.  Musculoskeletal:     Cervical back: Neck supple.     Right lower leg: No edema.     Left lower leg: No edema.  Lymphadenopathy:     Cervical: No cervical  adenopathy.  Skin:    General: Skin is warm and dry.     Capillary Refill: Capillary refill takes less than 2 seconds.     Findings: No rash.  Neurological:     Mental Status: She is alert and oriented to person, place, and time. Mental status is at baseline.  Psychiatric:        Mood and Affect: Mood normal.        Behavior: Behavior normal.      Results for orders placed or performed in visit on 05/30/19  POCT glucose (manual entry)  Result Value Ref Range   POC Glucose 242 (A) 70 - 99 mg/dl       Assessment & Plan    Problem List Items Addressed This Visit      Cardiovascular and Mediastinum   Essential hypertension with goal blood pressure less than 130/80    Blood pressure is well controlled today Her dizziness may have been caused by her temporarily elevated blood pressure in the setting of exertion and physical therapy No changes to medications today        Endocrine   Type 2 diabetes mellitus with hyperglycemia, without long-term current use of insulin (HCC) - Primary    Chronic and uncontrolled We again discussed that given her significantly reduced GFR, there are many medication she cannot take No changes to medications currently Again stressed that she may need to consider insulin in the future, but 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 I did give her a glucometer in our clinic today and showed her how to use it Advised her to give the medications time to work [employer] Follow-up as scheduled      Relevant Orders   POCT glucose (manual entry) (Completed)     Other   Dyspnea on exertion    She reports this is been a longstanding issue, but is unclear whether this is worsened recently She has been seen by cardiology previously for this with echo and Lexiscan Myoview in 2017 that were benign and did not show cause for dyspnea on exertion As it has possibly worsen, I did advise that she follow-up with cardiology      Relevant Orders   Ambulatory  referral to Cardiology       Follow-up as scheduled  The entirety of the information documented in the History of Present Illness, Review of Systems and Physical Exam were personally obtained by me. Portions of this information were initially documented by Lynford Humphrey, CMA and reviewed by me for thoroughness and accuracy.    Genova Kiner, Dionne Bucy, MD MPH [facility]  [facility]

## 2019-05-30 NOTE — Progress Notes (Signed)
This encounter was created in error - please disregard.

## 2019-05-31 ENCOUNTER — Ambulatory Visit: Payer: Self-pay | Admitting: *Deleted

## 2019-05-31 ENCOUNTER — Telehealth: Payer: Medicare Other | Admitting: *Deleted

## 2019-05-31 ENCOUNTER — Ambulatory Visit: Payer: Medicare Other | Admitting: Physical Therapy

## 2019-05-31 DIAGNOSIS — R0609 Other forms of dyspnea: Secondary | ICD-10-CM | POA: Insufficient documentation

## 2019-05-31 NOTE — Assessment & Plan Note (Signed)
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

## 2019-05-31 NOTE — Assessment & Plan Note (Signed)
She reports this is been a longstanding issue, but is unclear whether this is worsened recently She has been seen by cardiology previously for this with echo and Lexiscan Myoview in 2017 that were benign and did not show cause for dyspnea on exertion As it has possibly worsen, I did advise that she follow-up with cardiology

## 2019-05-31 NOTE — Assessment & Plan Note (Signed)
Blood pressure is well controlled today Her dizziness may have been caused by her temporarily elevated blood pressure in the setting of exertion and physical therapy No changes to medications today

## 2019-05-31 NOTE — Chronic Care Management (AMB) (Signed)
   Chronic Care Management   Unsuccessful Call Note 05/31/2019 Name: Kelli Gutierrez MRN: [id number] DOB: 04-16-1950  Patient  is a 69 year old female who sees Lavon Paganini, MD for primary care. Lavon Paganini, MD asked the CCM team to consult the patient for Mental Health Counseling and Resources.       This social worker was unable to reach patient via telephone today for follow up call. I was not able to leave a HIPAA compliant voicemail asking patient to return my call as there was no voicemail set up to leave a message . (unsuccessful outreach #2).   Plan: Will follow-up within 7-10  business days via telephone.      Elliot Gurney, [place] Worker  [facility] [phone]

## 2019-06-04 ENCOUNTER — Ambulatory Visit: Payer: Medicare Other | Admitting: Physical Therapy

## 2019-06-06 ENCOUNTER — Telehealth: Payer: Medicare Other

## 2019-06-07 ENCOUNTER — Ambulatory Visit: Payer: Medicare Other | Admitting: Physical Therapy

## 2019-06-07 ENCOUNTER — Telehealth: Payer: Medicare Other

## 2019-06-08 ENCOUNTER — Telehealth: Payer: Medicare Other

## 2019-06-08 ENCOUNTER — Ambulatory Visit: Payer: Self-pay | Admitting: *Deleted

## 2019-06-08 NOTE — Chronic Care Management (AMB) (Signed)
   Chronic Care Management   Unsuccessful Call Note 06/08/2019 Name: Kelli Gutierrez MRN: [id number] DOB: 06/26/1950  Patient  is a 69 year old female who sees Lavon Paganini, MD for primary care. Lavon Paganini. MD asked the CCM team to consult the patient for Mental Health Counseling and Resources.     This social worker was unable to reach patient via telephone today . I have left HIPAA compliant voicemail asking patient to return my call. (unsuccessful outreach #3).   Plan: This Education officer, museum will not make any additional calls to reach patient, however will be happy to engage patient upon her return call.     Elliot Gurney, [place] Worker  [facility] [phone]

## 2019-06-18 ENCOUNTER — Encounter: Payer: Self-pay | Admitting: Family Medicine

## 2019-06-18 ENCOUNTER — Other Ambulatory Visit: Payer: Self-pay

## 2019-06-18 ENCOUNTER — Ambulatory Visit (INDEPENDENT_AMBULATORY_CARE_PROVIDER_SITE_OTHER): Payer: Medicare Other | Admitting: Family Medicine

## 2019-06-18 VITALS — BP 135/85 | HR 67 | Temp 96.2°F | Resp 16 | Wt 147.0 lb

## 2019-06-18 DIAGNOSIS — E1165 Type 2 diabetes mellitus with hyperglycemia: Secondary | ICD-10-CM | POA: Diagnosis not present

## 2019-06-18 DIAGNOSIS — F320 Major depressive disorder, single episode, mild: Secondary | ICD-10-CM | POA: Insufficient documentation

## 2019-06-18 DIAGNOSIS — N1832 Chronic kidney disease, stage 3b: Secondary | ICD-10-CM

## 2019-06-18 DIAGNOSIS — I10 Essential (primary) hypertension: Secondary | ICD-10-CM | POA: Diagnosis not present

## 2019-06-18 DIAGNOSIS — F321 Major depressive disorder, single episode, moderate: Secondary | ICD-10-CM | POA: Diagnosis not present

## 2019-06-18 MED ORDER — LANCET DEVICES MISC: 3 refills | Status: DC

## 2019-06-18 MED ORDER — CONTOUR NEXT TEST VI STRP: ORAL_STRIP | 12 refills | Status: DC

## 2019-06-18 NOTE — Assessment & Plan Note (Signed)
Initially elevated, but well controlled on manual recheck at the end of the visit Continue current medications Reviewed last metabolic panel Follow-up in 6 weeks

## 2019-06-18 NOTE — Assessment & Plan Note (Signed)
Moderate MDD symptoms She is tearful today She again reiterates that this is related to her chronic disease and worry about who will take care of her disabled adult son if she dies Declines any medication Again encouraged her to reach out CCM social worker for counseling resources Continue to monitor

## 2019-06-18 NOTE — Progress Notes (Signed)
Patient: Kelli Gutierrez Female    DOB: 07/08/1950   69 y.o.   MRN: [id number] Visit Date: 06/18/2019  Today's Provider: Lavon Paganini, MD   Chief Complaint  Patient presents with  . Diabetes  . Hypertension   Subjective:    I [place] S. Dimas, CMA, am acting as scribe for Lavon Paganini, MD.  HPI  Hypertension, follow-up:  BP Readings from Last 3 Encounters:  06/18/19 (!) 179/77  05/30/19 132/72  05/23/19 139/80    She was last seen for hypertension 6 weeks ago.  BP at that visit was 132/72. Management changes since that visit include no changes. She reports excellent compliance with treatment. She is not having side effects.  She is not exercising. She is adherent to low salt diet.   Outside blood pressures are stable. She is experiencing none.  Patient denies chest pain.   Cardiovascular risk factors include advanced age (older than 35 for men, 24 for women), diabetes mellitus and hypertension.  Use of agents associated with hypertension: none.    Weight trend: stable Wt Readings from Last 3 Encounters:  06/18/19 147 lb (66.7 kg)  05/30/19 145 lb (65.8 kg)  05/23/19 144 lb (65.3 kg)   Current diet: in general, a "healthy" diet   ------------------------------------------------------------------------  Diabetes Mellitus Type II, Follow-up:   Lab Results  Component Value Date   HGBA1C 8.1 (H) 05/07/2019   HGBA1C 8.4 (H) 12/18/2014    Last seen for diabetes 6 weeks ago.  Management since then includes patient was seen at nephrology and was started on metformin 500 mg daily and back onto Jardiance She reports excellent compliance with treatment. She is not having side effects.  Current symptoms include none and have been stable. Home blood sugar records: fasting range: 200's  Episodes of hypoglycemia? no   Current insulin regiment: Is not on insulin Most Recent Eye Exam:  Weight trend: stable Prior visit with dietician: No Current exercise:  none Current diet habits: not asked  Pertinent Labs:    Component Value Date/Time   CHOL 174 05/07/2019 1536   TRIG 162 (H) 05/07/2019 1536   HDL 45 05/07/2019 1536   LDLCALC 101 (H) 05/07/2019 1536   CREATININE 1.52 (H) 05/07/2019 1536   CREATININE 0.93 10/10/2012 0827    Wt Readings from Last 3 Encounters:  06/18/19 147 lb (66.7 kg)  05/30/19 145 lb (65.8 kg)  05/23/19 144 lb (65.3 kg)    ------------------------------------------------------------------------   Allergies  Allergen Reactions  . Hydrocodone     Other reaction(s): Dizziness     Current Outpatient Medications:  .  aspirin 81 MG chewable tablet, Chew 81 mg by mouth daily., Disp: , Rfl:  .  atorvastatin (LIPITOR) 20 MG tablet, Take 1 tablet (20 mg total) by mouth daily., Disp: 90 tablet, Rfl: 3 .  clopidogrel (PLAVIX) 75 MG tablet, Take 1 tablet (75 mg total) by mouth daily., Disp: 90 tablet, Rfl: 3 .  hydroxychloroquine (PLAQUENIL) 200 MG tablet, Take 200 mg by mouth daily., Disp: , Rfl:  .  lisinopril (ZESTRIL) 40 MG tablet, Take 1 tablet (40 mg total) by mouth daily., Disp: 90 tablet, Rfl: 3 .  metFORMIN (GLUCOPHAGE-XR) 500 MG 24 hr tablet, Take by mouth., Disp: , Rfl:  .  metoprolol succinate (TOPROL-XL) 25 MG 24 hr tablet, Take 25 mg by mouth daily., Disp: , Rfl:  .  pioglitazone (ACTOS) 30 MG tablet, Take 1 tablet (30 mg total) by mouth daily., Disp: 30 tablet, Rfl:  3 .  sitaGLIPtin (JANUVIA) 25 MG tablet, Take 1 tablet (25 mg total) by mouth daily., Disp: 30 tablet, Rfl: 2 .  Vitamin D, Ergocalciferol, (DRISDOL) 1.25 MG (50000 UNIT) CAPS capsule, Take 1 capsule (50,000 Units total) by mouth every 7 (seven) days., Disp: 12 capsule, Rfl: 0  Review of Systems  Constitutional: Negative.   Respiratory: Negative.   Cardiovascular: Negative.   Endocrine: Negative.     Social History   Tobacco Use  . Smoking status: Never Smoker  . Smokeless tobacco: Never Used  Substance Use Topics  . Alcohol use:  No      Objective:   BP (!) 179/77 (BP Location: Left Arm, Patient Position: Sitting, Cuff Size: Normal)   Pulse 67   Temp (!) 96.2 F (35.7 C) (Temporal)   Resp 16   Wt 147 lb (66.7 kg)   BMI 26.89 kg/m  Vitals:   06/18/19 0835 06/18/19 0837  BP: (!) 186/82 (!) 179/77  Pulse: 69 67  Resp: 16   Temp: (!) 96.2 F (35.7 C)   TempSrc: Temporal   Weight: 147 lb (66.7 kg)   Body mass index is 26.89 kg/m.   Physical Exam Vitals reviewed.  Constitutional:      General: She is not in acute distress.    Appearance: Normal appearance. She is well-developed. She is not diaphoretic.  HENT:     Head: Normocephalic and atraumatic.  Eyes:     General: No scleral icterus.    Conjunctiva/sclera: Conjunctivae normal.  Neck:     Thyroid: No thyromegaly.  Cardiovascular:     Rate and Rhythm: Normal rate and regular rhythm.     Pulses: Normal pulses.     Heart sounds: Normal heart sounds. No murmur.  Pulmonary:     Effort: Pulmonary effort is normal. No respiratory distress.     Breath sounds: Normal breath sounds. No wheezing, rhonchi or rales.  Musculoskeletal:     Cervical back: Neck supple.     Right lower leg: No edema.     Left lower leg: No edema.  Lymphadenopathy:     Cervical: No cervical adenopathy.  Skin:    General: Skin is warm and dry.     Capillary Refill: Capillary refill takes less than 2 seconds.     Findings: No rash.  Neurological:     Mental Status: She is alert and oriented to person, place, and time. Mental status is at baseline.  Psychiatric:        Mood and Affect: Mood is depressed. Affect is tearful.        Behavior: Behavior normal.        Thought Content: Thought content normal.      No results found for any visits on 06/18/19. Depression screen [facility] 2/9 05/07/2019 09/14/2016 08/18/2016 07/29/2016 06/29/2016  Decreased Interest 0 0 0 0 0  Down, Depressed, Hopeless 3 0 0 0 0  PHQ - 2 Score 3 0 0 0 0  Altered sleeping 3 - - - -  Tired, decreased  energy 3 - - - -  Change in appetite 1 - - - -  Feeling bad or failure about yourself  1 - - - -  Trouble concentrating 1 - - - -  Moving slowly or fidgety/restless 0 - - - -  Suicidal thoughts 0 - - - -  PHQ-9 Score 12 - - - -       Assessment & Plan    Problem List Items Addressed This  Visit      Cardiovascular and Mediastinum   Essential hypertension with goal blood pressure less than 130/80 - Primary    Initially elevated, but well controlled on manual recheck at the end of the visit Continue current medications Reviewed last metabolic panel Follow-up in 6 weeks        Endocrine   Type 2 diabetes mellitus with hyperglycemia, without long-term current use of insulin (HCC)    Chronic and uncontrolled Given the improvement in her GFR, which reflects that she previously had AKI on CKD, she is now appropriate to take low-dose Metformin and Jardiance again As long as GFR >30, can continue Jardiance, but efficacy is decreased at lower GFR's Max dose of Metformin 500 mg twice daily, but continue 500 mg daily currently Continue Januvia at current dose Discontinue Actos Sent refills for glucometer strips and lancets Follow-up in 6 weeks and repeat A1c      Relevant Medications   metFORMIN (GLUCOPHAGE-XR) 500 MG 24 hr tablet   empagliflozin (JARDIANCE) 10 MG TABS tablet   glucose blood (CONTOUR NEXT TEST) test strip   Lancet Devices MISC     Genitourinary   Stage III chronic kidney disease    Chronic, stable Followed by nephrology GFR has improved and she has stage IIIb CKD Avoid nephrotoxins Renally dose adjust medications as above        Other   Current moderate episode of major depressive disorder without prior episode (HCC)    Moderate MDD symptoms She is tearful today She again reiterates that this is related to her chronic disease and worry about who will take care of her disabled adult son if she dies Declines any medication Again encouraged her to reach out  CCM social worker for counseling resources Continue to monitor          Return in about 6 weeks (around 07/30/2019) for chronic disease f/u after 4/25.   The entirety of the information documented in the History of Present Illness, Review of Systems and Physical Exam were personally obtained by me. Portions of this information were initially documented by Lynford Humphrey, CMA and reviewed by me for thoroughness and accuracy.    Buffi Ewton, Dionne Bucy, MD MPH [facility]

## 2019-06-18 NOTE — Patient Instructions (Signed)
Chrystal Land, LCSW Clinical Social Worker  [facility] [phone]  No more Actos, continue Metformin, Jardiance, and Januvia at current doses

## 2019-06-18 NOTE — Assessment & Plan Note (Signed)
Chronic, stable Followed by nephrology GFR has improved and she has stage IIIb CKD Avoid nephrotoxins Renally dose adjust medications as above

## 2019-06-18 NOTE — Assessment & Plan Note (Signed)
Chronic and uncontrolled Given the improvement in her GFR, which reflects that she previously had AKI on CKD, she is now appropriate to take low-dose Metformin and Jardiance again As long as GFR >30, can continue Jardiance, but efficacy is decreased at lower GFR's Max dose of Metformin 500 mg twice daily, but continue 500 mg daily currently Continue Januvia at current dose Discontinue Actos Sent refills for glucometer strips and lancets Follow-up in 6 weeks and repeat A1c

## 2019-06-19 ENCOUNTER — Other Ambulatory Visit
Admission: RE | Admit: 2019-06-19 | Discharge: 2019-06-19 | Disposition: A | Discharge status: Home / Self Care | Payer: Medicare Other | Source: Ambulatory Visit | Attending: Internal Medicine | Admitting: Internal Medicine

## 2019-06-19 DIAGNOSIS — R0602 Shortness of breath: Secondary | ICD-10-CM | POA: Insufficient documentation

## 2019-06-19 LAB — FIBRIN DERIVATIVES D-DIMER (ARMC ONLY): Fibrin derivatives D-dimer (ARMC): 797.23 ng/mL (FEU) — ABNORMAL HIGH (ref 0.00–499.00)

## 2019-06-19 LAB — BRAIN NATRIURETIC PEPTIDE: B Natriuretic Peptide: 198 pg/mL — ABNORMAL HIGH (ref 0.0–100.0)

## 2019-06-22 NOTE — Chronic Care Management (AMB) (Signed)
Chronic Care Management   Note  06/22/2019- late entry Name: Kelli Gutierrez MRN: [id number] DOB: May 06, 1950  Subjective:  Kelli Gutierrez is a 69 year old female patient of Dr. Lavon Paganini referred to [facility] clinical pharmacy services for medication review and management. Initial medication review via telephone appointment. HIPAA identifiers verified.   Objective: Lab Results  Component Value Date   CREATININE 1.52 (H) 05/07/2019   CREATININE 2.09 (H) 02/03/2019   CREATININE 1.34 (H) 03/23/2016    Lab Results  Component Value Date   HGBA1C 8.1 (H) 05/07/2019    Lipid Panel     Component Value Date/Time   CHOL 174 05/07/2019 1536   TRIG 162 (H) 05/07/2019 1536   HDL 45 05/07/2019 1536   CHOLHDL 3.9 05/07/2019 1536   CHOLHDL 6.1 12/18/2014 0437   VLDL 55 (H) 12/18/2014 0437   LDLCALC 101 (H) 05/07/2019 1536    BP Readings from Last 3 Encounters:  06/18/19 135/85  05/30/19 132/72  05/23/19 139/80    Allergies  Allergen Reactions  . Hydrocodone     Other reaction(s): Dizziness    Medications Reviewed Today    Reviewed by Virginia Crews, MD (Physician) on 06/18/19 at [place]  Med List Status: <None>  Medication Order Taking? Sig Documenting Provider Last Dose Status Informant  aspirin 81 MG chewable tablet [id number] Yes Chew 81 mg by mouth daily. [provider] Taking Active   atorvastatin (LIPITOR) 20 MG tablet [id number] Yes Take 1 tablet (20 mg total) by mouth daily. Virginia Crews, MD Taking Active   clopidogrel (PLAVIX) 75 MG tablet [id number] Yes Take 1 tablet (75 mg total) by mouth daily. Virginia Crews, MD Taking Active   empagliflozin (JARDIANCE) 10 MG TABS tablet [id number] Yes Take 10 mg by mouth daily. [provider]  Active   glucose blood (CONTOUR NEXT TEST) test strip [id number]  Use to test blood glucose as directed 1-4 times daily Virginia Crews, MD  Active   hydroxychloroquine (PLAQUENIL) 200 MG tablet [id number] Yes  Take 200 mg by mouth daily. [provider] Taking Active   Lancet Devices MISC [id number]  Use to test blood glucose as directed 1-4 times daily Virginia Crews, MD  Active   lisinopril (ZESTRIL) 40 MG tablet [id number] Yes Take 1 tablet (40 mg total) by mouth daily. Virginia Crews, MD Taking Active   metFORMIN (GLUCOPHAGE-XR) 500 MG 24 hr tablet [id number] Yes Take by mouth. [provider] Taking Active   metoprolol succinate (TOPROL-XL) 25 MG 24 hr tablet [id number] Yes Take 25 mg by mouth daily. [provider] Taking Active   sitaGLIPtin (JANUVIA) 25 MG tablet [id number] Yes Take 1 tablet (25 mg total) by mouth daily. Virginia Crews, MD Taking Active   Vitamin D, Ergocalciferol, (DRISDOL) 1.25 MG (50000 UNIT) CAPS capsule [id number] Yes Take 1 capsule (50,000 Units total) by mouth every 7 (seven) days. Virginia Crews, MD Taking Active            Med Note [name] May 10, 2019 10:08 AM) Tuesdays  Med List Note [name], [place] 08/02/16 1328): New patient UDS done 03-18-2016 Mr 07/29/16 Medical clearance sent to Dr Melrose Nakayama to Stop Plaxix 10 days before procedure 06/29/16 (let [name] know when received so we can make appt  08/02/2016 Received permission from Dr. Melrose Nakayama to stop Plavix for 10 days prior to procedure- spoke with Loma Sousa [phone]. [name] made aware so that she can schedule patient  for procedure.           Assessment:   Goals    . I want to know my medications (pt-stated)     Current Barriers:  . Polypharmacy; complex patient with multiple comorbidities including DM, HTN . Self-manages medications, Does not use a pill box or other adherence strategies   Pharmacist Clinical Goal(s):  Marland Kitchen Over the next 30 days, patient will work with PharmD and provider towards optimized medication management  Interventions: . Comprehensive medication review performed; medication list updated in electronic medical  record . Requested fill history from several pharmacies patient has used in the past 1 year in order to clarify adherence . Developed a medication chart that patient can refer to at doctor office visits and to help adherence  Patient Self Care Activities:  . Patient will take medications as prescribed . Patient will focus on improved adherence by utilizing medication adherence chart   Initial goal documentation        Plan: Recommendations discussed with provider - none at this time  Recommendations discussed with patient - utilize medication chart (mailed)  Follow up: Telephone follow up appointment with care management team member scheduled for: 1-2 month follow up with pharmacist for continued medication education  Ruben Reason, PharmD Clinical Pharmacist [place] [phone]

## 2019-06-25 ENCOUNTER — Other Ambulatory Visit: Payer: Self-pay | Admitting: Family Medicine

## 2019-06-25 MED ORDER — VITAMIN D (ERGOCALCIFEROL) 1.25 MG (50000 UNIT) PO CAPS: 50000.0000 [IU] | ORAL_CAPSULE | ORAL | 0 refills | Status: DC

## 2019-06-25 MED ORDER — METOPROLOL SUCCINATE ER 25 MG PO TB24: 25.0000 mg | ORAL_TABLET | Freq: Every day | ORAL | 1 refills | Status: DC

## 2019-06-25 NOTE — Telephone Encounter (Signed)
Requested medication (s) are due for refill today: metoprolol unsure/ and Vitamin D no  Requested medication (s) are on the active medication list: metoprolol (historical med and provider) and Vitamin D is on AML  Last refill:  metoprolol 05/23/19 Vitamin D 05/08/19  Future visit scheduled: Yes  Notes to clinic:  Metoprolol is a historical med and provider Vitamin D request is early   Requested Prescriptions  Pending Prescriptions Disp Refills   metoprolol succinate (TOPROL-XL) 25 MG 24 hr tablet       Sig: Take 1 tablet (25 mg total) by mouth daily.      Cardiovascular:  Beta Blockers Passed - 06/25/2019  1:48 PM      Passed - Last BP in normal range    BP Readings from Last 1 Encounters:  06/18/19 135/85          Passed - Last Heart Rate in normal range    Pulse Readings from Last 1 Encounters:  06/18/19 67          Passed - Valid encounter within last 6 months    Recent Outpatient Visits           1 week ago Essential hypertension with goal blood pressure less than 130/80   [facility], Dionne Bucy, MD   3 weeks ago Type 2 diabetes mellitus with hyperglycemia, without long-term current use of insulin [facility])   [facility], Dionne Bucy, MD   1 month ago Type 2 diabetes mellitus with hyperglycemia, without long-term current use of insulin [facility])   [facility], Dionne Bucy, MD   1 month ago Type 2 diabetes mellitus with other specified complication, without long-term current use of insulin ([place])   [facility], Dionne Bucy, MD       Future Appointments             In 1 month Bacigalupo, Dionne Bucy, MD [facility]              Vitamin D, Ergocalciferol, (DRISDOL) 1.25 MG (50000 UNIT) CAPS capsule 12 capsule 0    Sig: Take 1 capsule (50,000 Units total) by mouth every 7 (seven) days.      Endocrinology:  Vitamins - Vitamin D Supplementation Failed - 06/25/2019   1:48 PM      Failed - 50,000 IU strengths are not delegated      Failed - Phosphate in normal range and within 360 days    No results found for: PHOS        Failed - Vitamin D in normal range and within 360 days    25-Hydroxy, Vitamin D-3  Date Value Ref Range Status  03/23/2016 19 ng/mL Final    Comment:    (NOTE) Performed At: [facility] [place], [place] [id number] Pepkowitz Sheral Apley MD [id number]    25-Hydroxy, Vitamin D-2  Date Value Ref Range Status  03/23/2016 <1.0 ng/mL Final   25-Hydroxy, Vitamin D  Date Value Ref Range Status  03/23/2016 19 (L) ng/mL Final    Comment:    (NOTE) Reference Range: All Ages: Target levels 30 - 100    Vit D, 25-Hydroxy  Date Value Ref Range Status  05/07/2019 18.1 (L) 30.0 - 100.0 ng/mL Final    Comment:    Vitamin D deficiency has been defined by the [place] practice guideline as a level of serum 25-OH vitamin D less  than 20 ng/mL (1,2). The Endocrine Society went on to further define vitamin D insufficiency as a level between 21 and 29 ng/mL (2). 1. IOM (Institute of Medicine). 2010. Dietary reference    intakes for calcium and D. [place]: The    [employer]. 2. Holick MF, Binkley [place], Bischoff-Ferrari HA, et al.    Evaluation, treatment, and prevention of vitamin D    deficiency: an Endocrine Society clinical practice    guideline. JCEM. 2011 Jul; 96(7):1911-30.           Passed - Ca in normal range and within 360 days    Calcium  Date Value Ref Range Status  05/07/2019 9.4 8.7 - 10.3 mg/dL Final   Calcium, Total  Date Value Ref Range Status  10/10/2012 9.5 8.5 - 10.1 mg/dL Final          Passed - Valid encounter within last 12 months    Recent Outpatient Visits           1 week ago Essential hypertension with goal blood pressure less than 130/80   [facility], Dionne Bucy, MD   3 weeks  ago Type 2 diabetes mellitus with hyperglycemia, without long-term current use of insulin [facility])   [facility], Dionne Bucy, MD   1 month ago Type 2 diabetes mellitus with hyperglycemia, without long-term current use of insulin [facility])   [facility], Dionne Bucy, MD   1 month ago Type 2 diabetes mellitus with other specified complication, without long-term current use of insulin [facility])   [place], Dionne Bucy, MD       Future Appointments             In 1 month Bacigalupo, Dionne Bucy, MD [facility]

## 2019-06-25 NOTE — Telephone Encounter (Signed)
Medication Refill - Medication:  metoprolol succinate (TOPROL-XL) 25 MG 24 hr tablet Vitamin D, Ergocalciferol, (DRISDOL) 1.25 MG (50000 UNIT) CAPS capsule  Has the patient contacted their pharmacy? Yes advised to call office.   Preferred Pharmacy (with phone number or street name):  [place], [place] Phone:  [phone]  Fax:  [phone]     Agent: Please be advised that RX refills may take up to 3 business days. We ask that you follow-up with your pharmacy.

## 2019-07-06 ENCOUNTER — Ambulatory Visit: Payer: Medicare Other | Attending: Internal Medicine

## 2019-07-06 DIAGNOSIS — Z23 Encounter for immunization: Secondary | ICD-10-CM

## 2019-07-06 NOTE — Progress Notes (Signed)
   Covid-19 Vaccination Clinic  Name:  JANELLY SWITALSKI    MRN: [id number] DOB: Jan 03, 1951  07/06/2019  Ms. Moeckel was observed post Covid-19 immunization for 15 minutes without incident. She was provided with Vaccine Information Sheet and instruction to access the V-Safe system.   Ms. Willy was instructed to call 911 with any severe reactions post vaccine: Marland Kitchen Difficulty breathing  . Swelling of face and throat  . A fast heartbeat  . A bad rash all over body  . Dizziness and weakness   Immunizations Administered    Name Date Dose VIS Date Route   Pfizer COVID-19 Vaccine 07/06/2019 11:49 AM 0.3 mL 03/23/2019 Intramuscular   Manufacturer: [place]   Lot: [id number]   [place]: [address]-5

## 2019-07-09 ENCOUNTER — Other Ambulatory Visit: Payer: Self-pay | Admitting: Family Medicine

## 2019-07-09 MED ORDER — SITAGLIPTIN PHOSPHATE 25 MG PO TABS: 25.0000 mg | ORAL_TABLET | Freq: Every day | ORAL | 2 refills | Status: DC

## 2019-07-09 NOTE — Telephone Encounter (Signed)
Medication Refill - Medication: Januvia 25mg  x 3 months supply  Has the patient contacted their pharmacy? No. (Agent: If no, request that the patient contact the pharmacy for the refill.) (Agent: If yes, when and what did the pharmacy advise?)  Preferred Pharmacy (with phone number or street name): [place]  Agent: Please be advised that RX refills may take up to 3 business days. We ask that you follow-up with your pharmacy.

## 2019-07-09 NOTE — Telephone Encounter (Signed)
Requested Prescriptions  Pending Prescriptions Disp Refills  . sitaGLIPtin (JANUVIA) 25 MG tablet 30 tablet 2    Sig: Take 1 tablet (25 mg total) by mouth daily.     Endocrinology:  Diabetes - DPP-4 Inhibitors Failed - 07/09/2019  9:29 AM      Failed - HBA1C is between 0 and 7.9 and within 180 days    Hgb A1c MFr Bld  Date Value Ref Range Status  05/07/2019 8.1 (H) 4.8 - 5.6 % Final    Comment:             Prediabetes: 5.7 - 6.4          Diabetes: >6.4          Glycemic control for adults with diabetes: <7.0          Failed - Cr in normal range and within 360 days    Creatinine  Date Value Ref Range Status  10/10/2012 0.93 0.60 - 1.30 mg/dL Final   Creatinine, Ser  Date Value Ref Range Status  05/07/2019 1.52 (H) 0.57 - 1.00 mg/dL Final         Passed - Valid encounter within last 6 months    Recent Outpatient Visits          3 weeks ago Essential hypertension with goal blood pressure less than 130/80   [facility], Dionne Bucy, MD   1 month ago Type 2 diabetes mellitus with hyperglycemia, without long-term current use of insulin [facility])   [facility], Dionne Bucy, MD   1 month ago Type 2 diabetes mellitus with hyperglycemia, without long-term current use of insulin [facility])   [facility], Dionne Bucy, MD   2 months ago Type 2 diabetes mellitus with other specified complication, without long-term current use of insulin [facility])   [place], Dionne Bucy, MD      Future Appointments            In 1 month Bacigalupo, Dionne Bucy, MD [facility], [facility]

## 2019-07-23 ENCOUNTER — Other Ambulatory Visit: Payer: Self-pay | Admitting: Family Medicine

## 2019-07-23 MED ORDER — EMPAGLIFLOZIN 10 MG PO TABS: 10.0000 mg | ORAL_TABLET | Freq: Every day | ORAL | 0 refills | Status: DC

## 2019-07-23 NOTE — Telephone Encounter (Signed)
Medication Refill - Medication:  empagliflozin (JARDIANCE) 10 MG TABS tablet   Has the patient contacted their pharmacy?  Yes advised to call office.   Preferred Pharmacy (with phone number or street name):  [place], [place] Phone:  [phone]  Fax:  [phone]       Agent: Please be advised that RX refills may take up to 3 business days. We ask that you follow-up with your pharmacy.

## 2019-07-23 NOTE — Telephone Encounter (Signed)
Requested medication (s) are due for refill today: not specified  Requested medication (s) are on the active medication list: hx provider  Last refill:  06/18/19  Future visit scheduled: yes  Notes to clinic:  Historical med and provider   Requested Prescriptions  Pending Prescriptions Disp Refills   empagliflozin (JARDIANCE) 10 MG TABS tablet 30 tablet 0    Sig: Take 10 mg by mouth daily.      Endocrinology:  Diabetes - SGLT2 Inhibitors Failed - 07/23/2019  9:58 AM      Failed - Cr in normal range and within 360 days    Creatinine  Date Value Ref Range Status  10/10/2012 0.93 0.60 - 1.30 mg/dL Final   Creatinine, Ser  Date Value Ref Range Status  05/07/2019 1.52 (H) 0.57 - 1.00 mg/dL Final          Failed - LDL in normal range and within 360 days    LDL Chol Calc (NIH)  Date Value Ref Range Status  05/07/2019 101 (H) 0 - 99 mg/dL Final          Failed - HBA1C is between 0 and 7.9 and within 180 days    Hgb A1c MFr Bld  Date Value Ref Range Status  05/07/2019 8.1 (H) 4.8 - 5.6 % Final    Comment:             Prediabetes: 5.7 - 6.4          Diabetes: >6.4          Glycemic control for adults with diabetes: <7.0           Failed - eGFR in normal range and within 360 days    EGFR (African American)  Date Value Ref Range Status  10/10/2012 >60  Final   GFR calc Af Amer  Date Value Ref Range Status  05/07/2019 40 (L) >59 mL/min/1.73 Final   EGFR (Non-African Amer.)  Date Value Ref Range Status  10/10/2012 >60  Final    Comment:    eGFR values <69m/min/1.73 m2 may be an indication of chronic kidney disease (CKD). Calculated eGFR is useful in patients with stable renal function. The eGFR calculation will not be reliable in acutely ill patients when serum creatinine is changing rapidly. It is not useful in  patients on dialysis. The eGFR calculation may not be applicable to patients at the low and high extremes of body sizes, pregnant women, and  vegetarians.    GFR calc non Af Amer  Date Value Ref Range Status  05/07/2019 35 (L) >59 mL/min/1.73 Final          Passed - Valid encounter within last 6 months    Recent Outpatient Visits           1 month ago Essential hypertension with goal blood pressure less than 130/80   [facility]   1 month ago Type 2 diabetes mellitus with hyperglycemia, without long-term current use of insulin ([place]   [facility]   2 months ago Type 2 diabetes mellitus with hyperglycemia, without long-term current use of insulin ([facility]   [facility]   2 months ago Type 2 diabetes mellitus with other specified complication, without long-term current use of insulin ([facility]   [facility]       Future Appointments  In 2 weeks Bacigalupo, Dionne Bucy, MD [facility], [place]

## 2019-08-01 ENCOUNTER — Ambulatory Visit: Payer: Medicare Other | Attending: Internal Medicine

## 2019-08-01 DIAGNOSIS — Z23 Encounter for immunization: Secondary | ICD-10-CM

## 2019-08-01 NOTE — Progress Notes (Signed)
   Covid-19 Vaccination Clinic  Name:  Kelli Gutierrez    MRN: [id number] DOB: [birth year]  08/01/2019  [name] was observed post Covid-19 immunization for 15 minutes without incident. She was provided with Vaccine Information Sheet and instruction to access the V-Safe system.   [name] was instructed to call 911 with any severe reactions post vaccine: Marland Kitchen Difficulty breathing  . Swelling of face and throat  . A fast heartbeat  . A bad rash all over body  . Dizziness and weakness   Immunizations Administered    Name Date Dose VIS Date Route   Pfizer COVID-19 Vaccine 08/01/2019  8:19 AM 0.3 mL 06/06/2018 Intramuscular   Manufacturer: [place]   Lot: [id number]   [place]: [id number]

## 2019-08-07 NOTE — Progress Notes (Signed)
Subjective:   Kelli Gutierrez is a 69 y.o. female who presents for an Initial Medicare Annual Wellness Visit.    This visit is being conducted through telemedicine due to the COVID-19 pandemic. This patient has given me verbal consent via doximity to conduct this visit, patient states they are participating from their home address. Some vital signs may be absent or patient reported.    Patient identification: identified by name, DOB, and current address  Review of Systems    N/A  Cardiac Risk Factors include: advanced age (>46men, >25 women);diabetes mellitus;dyslipidemia;hypertension     Objective:    Today's Vitals   08/08/19 1128 08/08/19 1129  PainSc: 0-No pain 0-No pain   There is no height or weight on file to calculate BMI. Unable to obtain vitals due to visit being conducted via telephonically.   Advanced Directives 08/08/2019 05/02/2019 02/03/2019 08/10/2017 09/14/2016 08/18/2016 07/29/2016  Does Patient Have a Medical Advance Directive? No No No No No No No  Would patient like information on creating a medical advance directive? - No - Patient declined No - Patient declined No - Patient declined - - No - Patient declined    Current Medications (verified) Outpatient Encounter Medications as of 08/08/2019  Medication Sig  . amLODipine (NORVASC) 5 MG tablet Take 5 mg by mouth daily.   Marland Kitchen aspirin 81 MG chewable tablet Chew 81 mg by mouth daily.  Marland Kitchen atorvastatin (LIPITOR) 20 MG tablet Take 1 tablet (20 mg total) by mouth daily.  . clopidogrel (PLAVIX) 75 MG tablet Take 1 tablet (75 mg total) by mouth daily.  Marland Kitchen glucose blood (CONTOUR NEXT TEST) test strip Use to test blood glucose as directed 1-4 times daily  . hydroxychloroquine (PLAQUENIL) 200 MG tablet Take 200 mg by mouth daily.  Elmore Guise Devices MISC Use to test blood glucose as directed 1-4 times daily  . lisinopril (ZESTRIL) 40 MG tablet Take 1 tablet (40 mg total) by mouth daily.  . metFORMIN (GLUCOPHAGE-XR) 500 MG 24 hr  tablet Take 500 mg by mouth 2 (two) times daily.   . metoprolol succinate (TOPROL-XL) 25 MG 24 hr tablet Take 1 tablet (25 mg total) by mouth daily.  . sitaGLIPtin (JANUVIA) 25 MG tablet Take 1 tablet (25 mg total) by mouth daily.  . Vitamin D, Ergocalciferol, (DRISDOL) 1.25 MG (50000 UNIT) CAPS capsule Take 1 capsule (50,000 Units total) by mouth every 7 (seven) days.  . empagliflozin (JARDIANCE) 10 MG TABS tablet Take 10 mg by mouth daily. (Patient not taking: Reported on 08/08/2019)   No facility-administered encounter medications on file as of 08/08/2019.    Allergies (verified) Hydrocodone   History: Past Medical History:  Diagnosis Date  . Acute CVA (cerebrovascular accident) ([place]) 12/17/2014  . Anemia   . Breast pain, right 06/01/2015  . Chronic kidney disease 12/2015  . CVA (cerebral infarction) 12/17/2014  . Diabetes mellitus without complication ([place])   . GERD (gastroesophageal reflux disease)   . Hyperlipidemia   . Hypertension   . Lumbar radiculitis 12/06/2013  . Meningitis   . Rheumatoid arthritis ([place])   . Stroke [facility])    Past Surgical History:  Procedure Laterality Date  . APPENDECTOMY    . CESAREAN SECTION     4  . CHOLECYSTECTOMY    . MIDDLE EAR SURGERY     Family History  Problem Relation Age of Onset  . Leukemia Mother        unsure of conditions, was "healthy, old lady"  .  Stroke Father   . Diabetes Father   . Diabetes Sister   . Breast cancer Neg Hx   . Colon cancer Neg Hx    Social History   Socioeconomic History  . Marital status: Legally Separated    Spouse name: Not on file  . Number of children: 4  . Years of education: Not on file  . Highest education level: 8th grade  Occupational History  . Occupation: retired  . Occupation: disable  Tobacco Use  . Smoking status: Never Smoker  . Smokeless tobacco: Never Used  Substance and Sexual Activity  . Alcohol use: No  . Drug use: Never  . Sexual activity: Not Currently  Other Topics Concern    . Not on file  Social History Narrative  . Not on file   Social Determinants of Health   Financial Resource Strain: Low Risk   . Difficulty of Paying Living Expenses: Not hard at all  Food Insecurity: No Food Insecurity  . Worried About Charity fundraiser in the Last Year: Never true  . Ran Out of Food in the Last Year: Never true  Transportation Needs: No Transportation Needs  . Lack of Transportation (Medical): No  . Lack of Transportation (Non-Medical): No  Physical Activity: Inactive  . Days of Exercise per Week: 0 days  . Minutes of Exercise per Session: 0 min  Stress: Stress Concern Present  . Feeling of Stress : Rather much  Social Connections: Moderately Isolated  . Frequency of Communication with Friends and Family: Three times a week  . Frequency of Social Gatherings with Friends and Family: Twice a week  . Attends Religious Services: Never  . Active Member of Clubs or Organizations: No  . Attends Archivist Meetings: Never  . Marital Status: Separated    Tobacco Counseling Counseling given: Not Answered   Clinical Intake:  Pre-visit preparation completed: Yes  Pain : 0-10 Pain Score: 0-No pain Pain Type: Chronic pain, Other (Comment)(past stroke and has RA) Pain Location: Shoulder Pain Orientation: Right, Left Pain Descriptors / Indicators: Aching, Tingling Pain Frequency: Intermittent     Nutritional Risks: None Diabetes: Yes  How often do you need to have someone help you when you read instructions, pamphlets, or other written materials from your doctor or pharmacy?: 4 - Often(Due to cataracts- pt declines removal.)   Diabetes:  Is the patient diabetic?  Yes  If diabetic, was a CBG obtained today?  No  Did the patient bring in their glucometer from home?  No  How often do you monitor your CBG's? Once every two to three days.   Financial Strains and Diabetes Management:  Are you having any financial strains with the device, your  supplies or your medication? No .  Does the patient want to be seen by Chronic Care Management for management of their diabetes?  No  Would the patient like to be referred to a Nutritionist or for Diabetic Management?  No   Diabetic Exams:  Diabetic Eye Exam: Overdue for diabetic eye exam. Pt has been advised about the importance in completing this exam. Pt is scheduled for 08/22/19.  Diabetic Foot Exam: Pt has been advised about the importance in completing this exam. Note made to follow up on this at next in office apt.   Interpreter Needed?: No  Information entered by :: [facility], LPN   Activities of Daily Living In your present state of health, do you have any difficulty performing the following activities: 08/08/2019 05/30/2019  Hearing? Y N  Comment Due to previous ear surgery on left ear. -  Vision? Y N  Comment Due to cataracts on both eyes. Denies removal. -  Difficulty concentrating or making decisions? Y N  Walking or climbing stairs? Y N  Comment Due to fear of climbing up or down stairs. -  Dressing or bathing? N N  Doing errands, shopping? N N  Preparing Food and eating ? N -  Using the Toilet? N -  In the past six months, have you accidently leaked urine? N -  Do you have problems with loss of bowel control? N -  Managing your Medications? N -  Managing your Finances? N -  Housekeeping or managing your Housekeeping? N -  Some recent data might be hidden     Immunizations and Health Maintenance Immunization History  Administered Date(s) Administered  . PFIZER SARS-COV-2 Vaccination 07/06/2019, 08/01/2019  . Pneumococcal Conjugate-13 02/13/2018  . Pneumococcal Polysaccharide-23 09/16/2015  . Td 10/21/1993, 11/24/1993, 01/04/1995   Health Maintenance Due  Topic Date Due  . FOOT EXAM  Never done  . OPHTHALMOLOGY EXAM  Never done  . COLONOSCOPY  Never done  . DEXA SCAN  Never done    Patient Care Team: Virginia Crews, MD as PCP - General (Family  Medicine) Vern Claude, [place] as [facility] [facility] Clarice Pole, MD as Referring Physician (Nephrology) Emmaline Kluver., MD (Rheumatology) Yolonda Kida, MD as Consulting Physician (Cardiology) Dingeldein, Remo Lipps, MD (Ophthalmology)  Indicate any recent Medical Services you may have received from other than [facility] providers in the past year (date may be approximate).     Assessment:   This is a routine wellness examination for [place].  Hearing/Vision screen No exam data present  Dietary issues and exercise activities discussed: Current Exercise Habits: The patient does not participate in regular exercise at present, Exercise limited by: orthopedic condition(s);neurologic condition(s)  Goals      Patient Stated   . I want to know my medications (pt-stated)     Current Barriers:  . Polypharmacy; complex patient with multiple comorbidities including DM, HTN . Self-manages medications, Does not use a pill box or other adherence strategies   Pharmacist Clinical Goal(s):  Marland Kitchen Over the next 30 days, patient will work with PharmD and provider towards optimized medication management  Interventions: . Comprehensive medication review performed; medication list updated in electronic medical record . Requested fill history from several pharmacies patient has used in the past 1 year in order to clarify adherence . Developed a medication chart that patient can refer to at doctor office visits and to help adherence  Patient Self Care Activities:  . Patient will take medications as prescribed . Patient will focus on improved adherence by utilizing medication adherence chart   Initial goal documentation       Other   . DIET - INCREASE WATER INTAKE     Recommend to drink at least 6-8 8oz glasses of water per day.      Depression Screen PHQ 2/9 Scores 08/08/2019 05/07/2019 09/14/2016 08/18/2016 07/29/2016 06/29/2016 03/18/2016  PHQ - 2 Score 1 3 0 0 0 0 0    PHQ- 9 Score - 12 - - - - -    Fall Risk Fall Risk  08/08/2019 05/30/2019 09/14/2016 08/18/2016 07/29/2016  Falls in the past year? 0 0 No No No  Number falls in past yr: 0 0 - - -  Injury with Fall? 0 0 - - -  Risk for fall due to : - No Fall Risks - - -  Follow up - Falls evaluation completed - - -    FALL RISK PREVENTION PERTAINING TO THE HOME:  Any stairs in or around the home? No  If so, are there any without handrails? N/A  Home free of loose throw rugs in walkways, pet beds, electrical cords, etc? Yes  Adequate lighting in your home to reduce risk of falls? Yes   ASSISTIVE DEVICES UTILIZED TO PREVENT FALLS:  Life alert? No  Use of a cane, walker or w/c? Yes  Grab bars in the bathroom? No  Shower chair or bench in shower? No  Elevated toilet seat or a handicapped toilet? No    TIMED UP AND GO:  Was the test performed? No .     Cognitive Function:     6CIT Screen 08/08/2019  What Year? 0 points  What month? 0 points  What time? 0 points  Count back from 20 0 points  Months in reverse 0 points  Repeat phrase 2 points  Total Score 2    Screening Tests Health Maintenance  Topic Date Due  . FOOT EXAM  Never done  . OPHTHALMOLOGY EXAM  Never done  . COLONOSCOPY  Never done  . DEXA SCAN  Never done  . TETANUS/TDAP  08/07/2020 (Originally 01/03/2005)  . MAMMOGRAM  08/20/2019  . HEMOGLOBIN A1C  11/04/2019  . INFLUENZA VACCINE  11/11/2019  . COVID-19 Vaccine  Completed  . Hepatitis C Screening  Completed  . PNA vac Low Risk Adult  Completed    Qualifies for Shingles Vaccine? Yes . Due for Shingrix. Pt has been advised to call insurance company to determine out of pocket expense. Advised may also receive vaccine at local pharmacy or Health Dept. Verbalized acceptance and understanding.  Tdap: Although this vaccine is not a covered service during a Wellness Exam, does the patient still wish to receive this vaccine today?  No . Advised may receive this vaccine at  local pharmacy or Health Dept. Aware to provide a copy of the vaccination record if obtained from local pharmacy or Health Dept. Verbalized acceptance and understanding.  Flu Vaccine: Due fall 2021.  Pneumococcal Vaccine: Completed series  Cancer Screenings:  Colorectal Screening: Currently due. Referral to GI placed today. Pt aware the office will call re: appt.  Mammogram: Completed 08/19/17. Repeat every 1-2 years as advised. Ordered today. Pt provided with contact info and advised to call to schedule appt.   Bone Density: Currently due. Ordered today. Pt provided with contact info and advised to call to schedule appt. Pt aware the office will call re: appt.  Lung Cancer Screening: (Low Dose CT Chest recommended if Age 54-80 years, 30 pack-year currently smoking OR have quit w/in 15years.) does not qualify.   Additional Screening:  Hepatitis C Screening: Up to date  Dental Screening: Recommended annual dental exams for proper oral hygiene  Community Resource Referral:  CRR required this visit?  No       Plan:  I have personally reviewed and addressed the Medicare Annual Wellness questionnaire and have noted the following in the patient's chart:  A. Medical and social history B. Use of alcohol, tobacco or illicit drugs  C. Current medications and supplements D. Functional ability and status E.  Nutritional status F.  Physical activity G. Advance directives H. List of other physicians I.  Hospitalizations, surgeries, and ER visits in previous 12 months J.  Vitals K. Screenings such  as hearing and vision if needed, cognitive and depression L. Referrals and appointments   In addition, I have reviewed and discussed with patient certain preventive protocols, quality metrics, and best practice recommendations. A written personalized care plan for preventive services as well as general preventive health recommendations were provided to patient.   [place] Score, [place]   [id number]  Nurse Health Advisor    Nurse Notes: Pt needs a diabetic foot exam at next in office apt. Pt has an eye exam scheduled 08/22/19. Requested records to be faxed to clinic.

## 2019-08-08 ENCOUNTER — Ambulatory Visit (INDEPENDENT_AMBULATORY_CARE_PROVIDER_SITE_OTHER): Payer: Medicare Other

## 2019-08-08 ENCOUNTER — Other Ambulatory Visit: Payer: Self-pay

## 2019-08-08 DIAGNOSIS — Z13828 Encounter for screening for other musculoskeletal disorder: Secondary | ICD-10-CM

## 2019-08-08 DIAGNOSIS — E2839 Other primary ovarian failure: Secondary | ICD-10-CM

## 2019-08-08 DIAGNOSIS — Z1211 Encounter for screening for malignant neoplasm of colon: Secondary | ICD-10-CM

## 2019-08-08 DIAGNOSIS — Z0000 Encounter for general adult medical examination without abnormal findings: Secondary | ICD-10-CM

## 2019-08-08 DIAGNOSIS — Z1231 Encounter for screening mammogram for malignant neoplasm of breast: Secondary | ICD-10-CM

## 2019-08-08 NOTE — Patient Instructions (Signed)
[name] , Thank you for taking time to come for your Medicare Wellness Visit. I appreciate your ongoing commitment to your health goals. Please review the following plan we discussed and let me know if I can assist you in the future.   Screening recommendations/referrals: Colonoscopy: Currently due. Referral to GI placed today. Pt aware the office will call re: appt. Mammogram: Up to date, due 08/2019. Ordered today. Pt advised to call the office to schedule the apt. Bone Density: Ordered today. Pt aware office will call re:apt. Recommended yearly ophthalmology/optometry visit for glaucoma screening and checkup Recommended yearly dental visit for hygiene and checkup  Vaccinations: Influenza vaccine: Due fall 2021 Pneumococcal vaccine: Completed series Tdap vaccine: Pt declines today.  Shingles vaccine: Pt declines today.     Advanced directives: Advance directive discussed with you today. Please request the form at next in office apt to complete, notarize and bring a copy to the office.  Conditions/risks identified: Recommend to increase water intake to 6-8 8 oz glasses a day.   Next appointment: 08/10/19 @ 9:40 AM with Dr Brita Romp. Declined scheduling an AWV for 2022 at this time.    Preventive Care 35 Years and Older, Female Preventive care refers to lifestyle choices and visits with your health care provider that can promote health and wellness. What does preventive care include?  A yearly physical exam. This is also called an annual well check.  Dental exams once or twice a year.  Routine eye exams. Ask your health care provider how often you should have your eyes checked.  Personal lifestyle choices, including:  Daily care of your teeth and gums.  Regular physical activity.  Eating a healthy diet.  Avoiding tobacco and drug use.  Limiting alcohol use.  Practicing safe sex.  Taking low-dose aspirin every day.  Taking vitamin and mineral supplements as recommended  by your health care provider. What happens during an annual well check? The services and screenings done by your health care provider during your annual well check will depend on your age, overall health, lifestyle risk factors, and family history of disease. Counseling  Your health care provider may ask you questions about your:  Alcohol use.  Tobacco use.  Drug use.  Emotional well-being.  Home and relationship well-being.  Sexual activity.  Eating habits.  History of falls.  Memory and ability to understand (cognition).  Work and work Statistician.  Reproductive health. Screening  You may have the following tests or measurements:  Height, weight, and BMI.  Blood pressure.  Lipid and cholesterol levels. These may be checked every 5 years, or more frequently if you are over 34 years old.  Skin check.  Lung cancer screening. You may have this screening every year starting at age 55 if you have a 30-pack-year history of smoking and currently smoke or have quit within the past 15 years.  Fecal occult blood test (FOBT) of the stool. You may have this test every year starting at age 42.  Flexible sigmoidoscopy or colonoscopy. You may have a sigmoidoscopy every 5 years or a colonoscopy every 10 years starting at age 65.  Hepatitis C blood test.  Hepatitis B blood test.  Sexually transmitted disease (STD) testing.  Diabetes screening. This is done by checking your blood sugar (glucose) after you have not eaten for a while (fasting). You may have this done every 1-3 years.  Bone density scan. This is done to screen for osteoporosis. You may have this done starting at age 2.  Mammogram. This may be done every 1-2 years. Talk to your health care provider about how often you should have regular mammograms. Talk with your health care provider about your test results, treatment options, and if necessary, the need for more tests. Vaccines  Your health care provider may  recommend certain vaccines, such as:  Influenza vaccine. This is recommended every year.  Tetanus, diphtheria, and acellular pertussis (Tdap, Td) vaccine. You may need a Td booster every 10 years.  Zoster vaccine. You may need this after age 5.  Pneumococcal 13-valent conjugate (PCV13) vaccine. One dose is recommended after age 41.  Pneumococcal polysaccharide (PPSV23) vaccine. One dose is recommended after age 14. Talk to your health care provider about which screenings and vaccines you need and how often you need them. This information is not intended to replace advice given to you by your health care provider. Make sure you discuss any questions you have with your health care provider. Document Released: 04/25/2015 Document Revised: 12/17/2015 Document Reviewed: 01/28/2015 Elsevier Interactive Patient Education  2017 [place] Prevention in the Home Falls can cause injuries. They can happen to people of all ages. There are many things you can do to make your home safe and to help prevent falls. What can I do on the outside of my home?  Regularly fix the edges of walkways and driveways and fix any cracks.  Remove anything that might make you trip as you walk through a door, such as a raised step or threshold.  Trim any bushes or trees on the path to your home.  Use bright outdoor lighting.  Clear any walking paths of anything that might make someone trip, such as rocks or tools.  Regularly check to see if handrails are loose or broken. Make sure that both sides of any steps have handrails.  Any raised decks and porches should have guardrails on the edges.  Have any leaves, snow, or ice cleared regularly.  Use sand or salt on walking paths during winter.  Clean up any spills in your garage right away. This includes oil or grease spills. What can I do in the bathroom?  Use night lights.  Install grab bars by the toilet and in the tub and shower. Do not use towel  bars as grab bars.  Use non-skid mats or decals in the tub or shower.  If you need to sit down in the shower, use a plastic, non-slip stool.  Keep the floor dry. Clean up any water that spills on the floor as soon as it happens.  Remove soap buildup in the tub or shower regularly.  Attach bath mats securely with double-sided non-slip rug tape.  Do not have throw rugs and other things on the floor that can make you trip. What can I do in the bedroom?  Use night lights.  Make sure that you have a light by your bed that is easy to reach.  Do not use any sheets or blankets that are too big for your bed. They should not hang down onto the floor.  Have a firm chair that has side arms. You can use this for support while you get dressed.  Do not have throw rugs and other things on the floor that can make you trip. What can I do in the kitchen?  Clean up any spills right away.  Avoid walking on wet floors.  Keep items that you use a lot in easy-to-reach places.  If you need to reach  something above you, use a strong step stool that has a grab bar.  Keep electrical cords out of the way.  Do not use floor polish or wax that makes floors slippery. If you must use wax, use non-skid floor wax.  Do not have throw rugs and other things on the floor that can make you trip. What can I do with my stairs?  Do not leave any items on the stairs.  Make sure that there are handrails on both sides of the stairs and use them. Fix handrails that are broken or loose. Make sure that handrails are as long as the stairways.  Check any carpeting to make sure that it is firmly attached to the stairs. Fix any carpet that is loose or worn.  Avoid having throw rugs at the top or bottom of the stairs. If you do have throw rugs, attach them to the floor with carpet tape.  Make sure that you have a light switch at the top of the stairs and the bottom of the stairs. If you do not have them, ask someone to  add them for you. What else can I do to help prevent falls?  Wear shoes that:  Do not have high heels.  Have rubber bottoms.  Are comfortable and fit you well.  Are closed at the toe. Do not wear sandals.  If you use a stepladder:  Make sure that it is fully opened. Do not climb a closed stepladder.  Make sure that both sides of the stepladder are locked into place.  Ask someone to hold it for you, if possible.  Clearly mark and make sure that you can see:  Any grab bars or handrails.  First and last steps.  Where the edge of each step is.  Use tools that help you move around (mobility aids) if they are needed. These include:  Canes.  Walkers.  Scooters.  Crutches.  Turn on the lights when you go into a dark area. Replace any light bulbs as soon as they burn out.  Set up your furniture so you have a clear path. Avoid moving your furniture around.  If any of your floors are uneven, fix them.  If there are any pets around you, be aware of where they are.  Review your medicines with your doctor. Some medicines can make you feel dizzy. This can increase your chance of falling. Ask your doctor what other things that you can do to help prevent falls. This information is not intended to replace advice given to you by your health care provider. Make sure you discuss any questions you have with your health care provider. Document Released: 01/23/2009 Document Revised: 09/04/2015 Document Reviewed: 05/03/2014 Elsevier Interactive Patient Education  2017 [employer].

## 2019-08-10 ENCOUNTER — Ambulatory Visit (INDEPENDENT_AMBULATORY_CARE_PROVIDER_SITE_OTHER): Payer: Medicare Other | Admitting: Family Medicine

## 2019-08-10 ENCOUNTER — Other Ambulatory Visit: Payer: Self-pay

## 2019-08-10 ENCOUNTER — Encounter: Payer: Self-pay | Admitting: Family Medicine

## 2019-08-10 VITALS — BP 158/88 | HR 69 | Temp 97.6°F | Wt 147.0 lb

## 2019-08-10 DIAGNOSIS — E1165 Type 2 diabetes mellitus with hyperglycemia: Secondary | ICD-10-CM

## 2019-08-10 DIAGNOSIS — E785 Hyperlipidemia, unspecified: Secondary | ICD-10-CM

## 2019-08-10 DIAGNOSIS — Z8673 Personal history of transient ischemic attack (TIA), and cerebral infarction without residual deficits: Secondary | ICD-10-CM

## 2019-08-10 DIAGNOSIS — N1832 Chronic kidney disease, stage 3b: Secondary | ICD-10-CM

## 2019-08-10 DIAGNOSIS — E1169 Type 2 diabetes mellitus with other specified complication: Secondary | ICD-10-CM | POA: Diagnosis not present

## 2019-08-10 DIAGNOSIS — I10 Essential (primary) hypertension: Secondary | ICD-10-CM | POA: Diagnosis not present

## 2019-08-10 LAB — POCT GLYCOSYLATED HEMOGLOBIN (HGB A1C): Hemoglobin A1C: 8.1 % — AB (ref 4.0–5.6)

## 2019-08-10 MED ORDER — ATORVASTATIN CALCIUM 20 MG PO TABS: 20.0000 mg | ORAL_TABLET | Freq: Every day | ORAL | 3 refills | Status: DC

## 2019-08-10 MED ORDER — CLOPIDOGREL BISULFATE 75 MG PO TABS: 75.0000 mg | ORAL_TABLET | Freq: Every day | ORAL | 3 refills | Status: DC

## 2019-08-10 MED ORDER — JARDIANCE 25 MG PO TABS: 25.0000 mg | ORAL_TABLET | Freq: Every day | ORAL | 1 refills | Status: DC

## 2019-08-10 NOTE — Assessment & Plan Note (Signed)
Chronic and stable.  Followed by nephrology Reviewed recent labs.

## 2019-08-10 NOTE — Assessment & Plan Note (Signed)
Previously well controlled Continue statin Goal LDL < 70

## 2019-08-10 NOTE — Progress Notes (Signed)
I,Laura E Walsh,acting as a scribe for Lavon Paganini, MD.,have documented all relevant documentation on the behalf of Lavon Paganini, MD,as directed by  Lavon Paganini, MD while in the presence of Lavon Paganini, MD.   Established patient visit   Patient: Kelli Gutierrez   DOB: [birth year]   69 y.o. Female  MRN: [id number]   Today's healthcare provider: Lavon Paganini, MD   Chief Complaint  Patient presents with  . Hypertension  . Hyperlipidemia  . Diabetes   Subjective    HPI Diabetes Mellitus Type II, follow-up  Lab Results  Component Value Date   HGBA1C 8.1 (A) 08/10/2019   HGBA1C 8.1 (H) 05/07/2019   HGBA1C 8.4 (H) 12/18/2014   Last seen for diabetes 3 months ago.  Management since then includes started Jardiance 10mg   She reports excellent compliance with treatment. She is not having side effects.   Home blood sugar records: fasting range: Low 100's  Episodes of hypoglycemia? No    Current insulin regiment: none Most Recent Eye Exam: pt is due for an eye exam   ----------------------------------------------------------------------------------------- Hypertension, follow-up  BP Readings from Last 3 Encounters:  08/10/19 (!) 158/88  06/18/19 135/85  05/30/19 132/72   She was last seen for hypertension 6 weeks ago.  BP at that visit was 135/85. Management since that visit includes Dr. Loney Loh Nephrology added Amlodipine 5mg  a day. She reports excellent compliance with treatment. She is not having side effects.  She is exercising. She is adherent to low salt diet.   Outside blood pressures are not being checked.  She does not smoke.  Use of agents associated with hypertension: none.   ----------------------------------------------------------------------------------------- Lipid/Cholesterol, follow-up  Last Lipid Panel: Lab Results  Component Value Date   CHOL 174 05/07/2019   LDLCALC 101 (H) 05/07/2019   HDL 45 05/07/2019   TRIG 162 (H) 05/07/2019   ALT 13 05/07/2019   AST 16 05/07/2019   PLT 238 05/07/2019    She was last seen for this 3 months ago.  Management since that visit includes no changes.  She reports excellent compliance with treatment. She is not having side effects.   Symptoms: No appetite changes No foot ulcerations No chest pain No chest pressure/discomfort No dyspnea No fatigue No lower extremity edema No nausea No numbness or tingling of extremity No orthopnea No palpitations No paroxysmal nocturnal dyspnea No polydipsia No polyuria No speech difficulty No syncope No visual disturbances   She is following a Diabetic diet.   Wt Readings from Last 3 Encounters:  08/10/19 147 lb (66.7 kg)  06/18/19 147 lb (66.7 kg)  05/30/19 145 lb (65.8 kg)   Last metabolic panel Lab Results  Component Value Date   GLUCOSE 91 05/07/2019   NA 140 05/07/2019   K 4.6 05/07/2019   BUN 28 (H) 05/07/2019   CREATININE 1.52 (H) 05/07/2019   GFRNONAA 35 (L) 05/07/2019   GFRAA 40 (L) 05/07/2019   CALCIUM 9.4 05/07/2019   AST 16 05/07/2019   ALT 13 05/07/2019   The ASCVD Risk score (Goff DC Jr., et al., 2013) failed to calculate for the following reasons:   The patient has a prior MI or stroke diagnosis  -----------------------------------------------------------------------------------------    Patient Active Problem List   Diagnosis Date Noted  . Dyspnea on exertion 05/31/2019  . Dizziness 05/23/2019  . COPD, mild ([place]) 05/08/2019  . 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 Anemia in stage 4  chronic kidney disease ([place]) 05/08/2019  . Current moderate episode of major depressive disorder without prior episode ([place]) 05/07/2019  . Neuropathic pain 09/14/2016  . 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 Vitamin D deficiency 06/23/2016  . Stage III  chronic kidney disease 03/23/2016  . Type 2 diabetes mellitus with hyperglycemia, without long-term current use of insulin ([place]) 03/18/2016  . Rheumatoid arthritis 03/18/2016  . Current use of long term anticoagulation (Plavix) 03/18/2016  . Hyperlipidemia associated with type 2 diabetes mellitus ([place]) 03/18/2016  . GERD (gastroesophageal reflux disease) 03/18/2016  . Essential hypertension with goal blood pressure less than 130/80 09/30/2015  . History of CVA (cerebrovascular accident) 04/02/2015   Social History   Tobacco Use  . Smoking status: Never Smoker  . Smokeless tobacco: Never Used  Substance Use Topics  . Alcohol use: No  . Drug use: Never   Allergies  Allergen Reactions  . Hydrocodone     Other reaction(s): Dizziness       Medications: Outpatient Medications Prior to Visit  Medication Sig  . amLODipine (NORVASC) 5 MG tablet Take 5 mg by mouth daily.   Marland Kitchen aspirin 81 MG chewable tablet Chew 81 mg by mouth daily.  Marland Kitchen glucose blood (CONTOUR NEXT TEST) test strip Use to test blood glucose as directed 1-4 times daily  . hydroxychloroquine (PLAQUENIL) 200 MG tablet Take 200 mg by mouth daily.  Elmore Guise Devices MISC Use to test blood glucose as directed 1-4 times daily  . lisinopril (ZESTRIL) 40 MG tablet Take 1 tablet (40 mg total) by mouth daily.  . metFORMIN (GLUCOPHAGE-XR) 500 MG 24 hr tablet Take 500 mg by mouth 2 (two) times daily.   . metoprolol succinate (TOPROL-XL) 25 MG 24 hr tablet Take 1 tablet (25 mg total) by mouth daily.  . sitaGLIPtin (JANUVIA) 25 MG tablet Take 1 tablet (25 mg total) by mouth daily.  . Vitamin D, Ergocalciferol, (DRISDOL) 1.25 MG (50000 UNIT) CAPS capsule Take 1 capsule (50,000 Units total) by mouth every 7 (seven) days.  . [DISCONTINUED] atorvastatin (LIPITOR) 20 MG tablet Take 1 tablet (20 mg total) by mouth daily.  . [DISCONTINUED] clopidogrel (PLAVIX) 75 MG tablet Take 1 tablet (75 mg total) by mouth daily.  . [DISCONTINUED]  empagliflozin (JARDIANCE) 10 MG TABS tablet Take 10 mg by mouth daily.   No facility-administered medications prior to visit.    Review of Systems  Constitutional: Negative.   Respiratory: Negative.   Cardiovascular: Negative.   Gastrointestinal: Negative.   Neurological: Positive for headaches. Negative for dizziness and light-headedness.    Last CBC Lab Results  Component Value Date   WBC 7.3 05/07/2019   HGB 11.8 05/07/2019   HCT 35.8 05/07/2019   MCV 88 05/07/2019   MCH 29.1 05/07/2019   RDW 13.2 05/07/2019   PLT 238 28/41/3244   Last metabolic panel Lab Results  Component Value Date   GLUCOSE 91 05/07/2019   NA 140 05/07/2019   K 4.6 05/07/2019   CL 108 (H) 05/07/2019   CO2 16 (L) 05/07/2019   BUN 28 (H) 05/07/2019   CREATININE 1.52 (H) 05/07/2019   GFRNONAA 35 (L) 05/07/2019   GFRAA 40 (L) 05/07/2019   CALCIUM 9.4 05/07/2019   PROT 7.9 05/07/2019   ALBUMIN 4.0 05/07/2019   LABGLOB 3.9 05/07/2019   AGRATIO 1.0 (L) 05/07/2019   BILITOT 0.4 05/07/2019   ALKPHOS 111 05/07/2019   AST 16 05/07/2019  ALT 13 05/07/2019   ANIONGAP 8 02/03/2019   Last lipids Lab Results  Component Value Date   CHOL 174 05/07/2019   HDL 45 05/07/2019   LDLCALC 101 (H) 05/07/2019   TRIG 162 (H) 05/07/2019   CHOLHDL 3.9 05/07/2019   Last hemoglobin A1c Lab Results  Component Value Date   HGBA1C 8.1 (A) 08/10/2019   Last thyroid functions No results found for: TSH, T3TOTAL, T4TOTAL, THYROIDAB Last vitamin D Lab Results  Component Value Date   25OHVITD2 <1.0 03/23/2016   25OHVITD3 19 03/23/2016   VD25OH 18.1 (L) 05/07/2019      Objective    BP (!) 158/88 (BP Location: Left Arm, Patient Position: Sitting, Cuff Size: Normal)   Pulse 69   Temp 97.6 F (36.4 C) (Temporal)   Wt 147 lb (66.7 kg)   SpO2 100%   BMI 26.89 kg/m  BP Readings from Last 3 Encounters:  08/10/19 (!) 158/88  06/18/19 135/85  05/30/19 132/72      Physical Exam Constitutional:       Appearance: Normal appearance.  HENT:     Head: Normocephalic and atraumatic.  Eyes:     Conjunctiva/sclera: Conjunctivae normal.  Cardiovascular:     Rate and Rhythm: Normal rate and regular rhythm.     Pulses: Normal pulses.     Heart sounds: Normal heart sounds.  Pulmonary:     Effort: Pulmonary effort is normal.     Breath sounds: Normal breath sounds.  Abdominal:     General: There is no distension.     Palpations: Abdomen is soft.     Tenderness: There is no abdominal tenderness.  Musculoskeletal:     Cervical back: Neck supple.     Right lower leg: No edema.     Left lower leg: No edema.  Lymphadenopathy:     Cervical: No cervical adenopathy.  Skin:    General: Skin is warm and dry.  Neurological:     Mental Status: She is alert and oriented to person, place, and time. Mental status is at baseline.     Gait: Gait normal.  Psychiatric:        Mood and Affect: Mood normal.        Behavior: Behavior normal.        Thought Content: Thought content normal.        Judgment: Judgment normal.       Results for orders placed or performed in visit on 08/10/19  POCT glycosylated hemoglobin (Hb A1C)  Result Value Ref Range   Hemoglobin A1C 8.1 (A) 4.0 - 5.6 %    Assessment & Plan     Problem List Items Addressed This Visit      Cardiovascular and Mediastinum   Essential hypertension with goal blood pressure less than 130/80 - Primary    Elevated today, pt stated she didn't take any of her medications this morning.  Nephrology recently added Amlodipine 5mg . Reviewed last metabolic panel Continue current medications.  Recheck in 3 months.        Relevant Medications   atorvastatin (LIPITOR) 20 MG tablet     Endocrine   Type 2 diabetes mellitus with hyperglycemia, without long-term current use of insulin (HCC)    Chronic  A1C still remains out of control at 8.1% Review last labs from nephrology Increased Jardiance 25mg  a day.  Continue Metformin and Januvia at  current dose.  Recheck A1C in three months.       Relevant Medications   atorvastatin (  LIPITOR) 20 MG tablet   empagliflozin (JARDIANCE) 25 MG TABS tablet   Other Relevant Orders   POCT glycosylated hemoglobin (Hb A1C) (Completed)   Hyperlipidemia associated with type 2 diabetes mellitus (HCC)    Previously well controlled Continue statin Goal LDL < 70       Relevant Medications   atorvastatin (LIPITOR) 20 MG tablet   empagliflozin (JARDIANCE) 25 MG TABS tablet     Genitourinary   Stage III chronic kidney disease    Chronic and stable.  Followed by nephrology Reviewed recent labs.        Other Visit Diagnoses    Hyperlipidemia, unspecified hyperlipidemia type       Relevant Medications   atorvastatin (LIPITOR) 20 MG tablet   History of stroke       Relevant Medications   clopidogrel (PLAVIX) 75 MG tablet      Return in about 3 months (around 11/09/2019) for Diabetes.      I, Lavon Paganini, MD, have reviewed all documentation for this visit. The documentation on 08/10/19 for the exam, diagnosis, procedures, and orders are all accurate and complete.   Nelani Schmelzle, Dionne Bucy, MD, MPH [facility]

## 2019-08-10 NOTE — Assessment & Plan Note (Signed)
Chronic  A1C still remains out of control at 8.1% Review last labs from nephrology Increased Jardiance 25mg  a day.  Continue Metformin and Januvia at current dose.  Recheck A1C in three months.

## 2019-08-10 NOTE — Assessment & Plan Note (Signed)
Elevated today, pt stated she didn't take any of her medications this morning.  Nephrology recently added Amlodipine 5mg . Reviewed last metabolic panel Continue current medications.  Recheck in 3 months.

## 2019-08-10 NOTE — Patient Instructions (Signed)
Hypertension, Adult Hypertension is another name for high blood pressure. High blood pressure forces your heart to work harder to pump blood. This can cause problems over time. There are two numbers in a blood pressure reading. There is a top number (systolic) over a bottom number (diastolic). It is best to have a blood pressure that is below 120/80. Healthy choices can help lower your blood pressure, or you may need medicine to help lower it. What are the causes? The cause of this condition is not known. Some conditions may be related to high blood pressure. What increases the risk?  Smoking.  Having type 2 diabetes mellitus, high cholesterol, or both.  Not getting enough exercise or physical activity.  Being overweight.  Having too much fat, sugar, calories, or salt (sodium) in your diet.  Drinking too much alcohol.  Having long-term (chronic) kidney disease.  Having a family history of high blood pressure.  Age. Risk increases with age.  Race. You may be at higher risk if you are African American.  Gender. Men are at higher risk than women before age 45. After age 65, women are at higher risk than men.  Having obstructive sleep apnea.  Stress. What are the signs or symptoms?  High blood pressure may not cause symptoms. Very high blood pressure (hypertensive crisis) may cause: ? Headache. ? Feelings of worry or nervousness (anxiety). ? Shortness of breath. ? Nosebleed. ? A feeling of being sick to your stomach (nausea). ? Throwing up (vomiting). ? Changes in how you see. ? Very bad chest pain. ? Seizures. How is this treated?  This condition is treated by making healthy lifestyle changes, such as: ? Eating healthy foods. ? Exercising more. ? Drinking less alcohol.  Your health care provider may prescribe medicine if lifestyle changes are not enough to get your blood pressure under control, and if: ? Your top number is above 130. ? Your bottom number is above  80.  Your personal target blood pressure may vary. Follow these instructions at home: Eating and drinking   If told, follow the DASH eating plan. To follow this plan: ? Fill one half of your plate at each meal with fruits and vegetables. ? Fill one fourth of your plate at each meal with whole grains. Whole grains include whole-wheat pasta, brown rice, and whole-grain bread. ? Eat or drink low-fat dairy products, such as skim milk or low-fat yogurt. ? Fill one fourth of your plate at each meal with low-fat (lean) proteins. Low-fat proteins include fish, chicken without skin, eggs, beans, and tofu. ? Avoid fatty meat, cured and processed meat, or chicken with skin. ? Avoid pre-made or processed food.  Eat less than 1,500 mg of salt each day.  Do not drink alcohol if: ? Your doctor tells you not to drink. ? You are pregnant, may be pregnant, or are planning to become pregnant.  If you drink alcohol: ? Limit how much you use to:  0-1 drink a day for women.  0-2 drinks a day for men. ? Be aware of how much alcohol is in your drink. In the [place] one drink equals one 12 oz bottle of beer (355 mL), one 5 oz glass of wine (148 mL), or one 1 oz glass of hard liquor (44 mL). Lifestyle   Work with your doctor to stay at a healthy weight or to lose weight. Ask your doctor what the best weight is for you.  Get at least 30 minutes of exercise most   days of the week. This may include walking, swimming, or biking.  Get at least 30 minutes of exercise that strengthens your muscles (resistance exercise) at least 3 days a week. This may include lifting weights or doing Pilates.  Do not use any products that contain nicotine or tobacco, such as cigarettes, e-cigarettes, and chewing tobacco. If you need help quitting, ask your doctor.  Check your blood pressure at home as told by your doctor.  Keep all follow-up visits as told by your doctor. This is important. Medicines  Take over-the-counter  and prescription medicines only as told by your doctor. Follow directions carefully.  Do not skip doses of blood pressure medicine. The medicine does not work as well if you skip doses. Skipping doses also puts you at risk for problems.  Ask your doctor about side effects or reactions to medicines that you should watch for. Contact a doctor if you:  Think you are having a reaction to the medicine you are taking.  Have headaches that keep coming back (recurring).  Feel dizzy.  Have swelling in your ankles.  Have trouble with your vision. Get help right away if you:  Get a very bad headache.  Start to feel mixed up (confused).  Feel weak or numb.  Feel faint.  Have very bad pain in your: ? Chest. ? Belly (abdomen).  Throw up more than once.  Have trouble breathing. Summary  Hypertension is another name for high blood pressure.  High blood pressure forces your heart to work harder to pump blood.  For most people, a normal blood pressure is less than 120/80.  Making healthy choices can help lower blood pressure. If your blood pressure does not get lower with healthy choices, you may need to take medicine. This information is not intended to replace advice given to you by your health care provider. Make sure you discuss any questions you have with your health care provider. Document Revised: 12/07/2017 Document Reviewed: 12/07/2017 Elsevier Patient Education  [place]. Diabetes Basics  Diabetes (diabetes mellitus) is a long-term (chronic) disease. It occurs when the body does not properly use sugar (glucose) that is released from food after you eat. Diabetes may be caused by one or both of these problems:  Your pancreas does not make enough of a hormone called insulin.  Your body does not react in a normal way to insulin that it makes. Insulin lets sugars (glucose) go into cells in your body. This gives you energy. If you have diabetes, sugars cannot get into  cells. This causes high blood sugar (hyperglycemia). Follow these instructions at home: How is diabetes treated? You may need to take insulin or other diabetes medicines daily to keep your blood sugar in balance. Take your diabetes medicines every day as told by your doctor. List your diabetes medicines here: Diabetes medicines  Name of medicine: ______________________________ ? Amount (dose): _______________ Time (a.m./p.m.): _______________ Notes: ___________________________________  Name of medicine: ______________________________ ? Amount (dose): _______________ Time (a.m./p.m.): _______________ Notes: ___________________________________  Name of medicine: ______________________________ ? Amount (dose): _______________ Time (a.m./p.m.): _______________ Notes: ___________________________________ If you use insulin, you will learn how to give yourself insulin by injection. You may need to adjust the amount based on the food that you eat. List the types of insulin you use here: Insulin  Insulin type: ______________________________ ? Amount (dose): _______________ Time (a.m./p.m.): _______________ Notes: ___________________________________  Insulin type: ______________________________ ? Amount (dose): _______________ Time (a.m./p.m.): _______________ Notes: ___________________________________  Insulin type: ______________________________ ? Amount (dose):  _______________ Time (a.m./p.m.): _______________ Notes: ___________________________________  Insulin type: ______________________________ ? Amount (dose): _______________ Time (a.m./p.m.): _______________ Notes: ___________________________________  Insulin type: ______________________________ ? Amount (dose): _______________ Time (a.m./p.m.): _______________ Notes: ___________________________________ How do I manage my blood sugar?  Check your blood sugar levels using a blood glucose monitor as directed by your doctor. Your doctor  will set treatment goals for you. Generally, you should have these blood sugar levels:  Before meals (preprandial): 80-130 mg/dL (4.4-7.2 mmol/L).  After meals (postprandial): below 180 mg/dL (10 mmol/L).  A1c level: less than 7%. Write down the times that you will check your blood sugar levels: Blood sugar checks  Time: _______________ Notes: ___________________________________  Time: _______________ Notes: ___________________________________  Time: _______________ Notes: ___________________________________  Time: _______________ Notes: ___________________________________  Time: _______________ Notes: ___________________________________  Time: _______________ Notes: ___________________________________  What do I need to know about low blood sugar? Low blood sugar is called hypoglycemia. This is when blood sugar is at or below 70 mg/dL (3.9 mmol/L). Symptoms may include:  Feeling: ? Hungry. ? Worried or nervous (anxious). ? Sweaty and clammy. ? Confused. ? Dizzy. ? Sleepy. ? Sick to your stomach (nauseous).  Having: ? A fast heartbeat. ? A headache. ? A change in your vision. ? Tingling or no feeling (numbness) around the mouth, lips, or tongue. ? Jerky movements that you cannot control (seizure).  Having trouble with: ? Moving (coordination). ? Sleeping. ? Passing out (fainting). ? Getting upset easily (irritability). Treating low blood sugar To treat low blood sugar, eat or drink something sugary right away. If you can think clearly and swallow safely, follow the 15:15 rule:  Take 15 grams of a fast-acting carb (carbohydrate). Talk with your doctor about how much you should take.  Some fast-acting carbs are: ? Sugar tablets (glucose pills). Take 3-4 glucose pills. ? 6-8 pieces of hard candy. ? 4-6 oz (120-150 mL) of fruit juice. ? 4-6 oz (120-150 mL) of regular (not diet) soda. ? 1 Tbsp (15 mL) honey or sugar.  Check your blood sugar 15 minutes after you  take the carb.  If your blood sugar is still at or below 70 mg/dL (3.9 mmol/L), take 15 grams of a carb again.  If your blood sugar does not go above 70 mg/dL (3.9 mmol/L) after 3 tries, get help right away.  After your blood sugar goes back to normal, eat a meal or a snack within 1 hour. Treating very low blood sugar If your blood sugar is at or below 54 mg/dL (3 mmol/L), you have very low blood sugar (severe hypoglycemia). This is an emergency. Do not wait to see if the symptoms will go away. Get medical help right away. Call your local emergency services (911 in the [place] Do not drive yourself to the hospital. Questions to ask your health care provider  Do I need to meet with a diabetes educator?  What equipment will I need to care for myself at home?  What diabetes medicines do I need? When should I take them?  How often do I need to check my blood sugar?  What number can I call if I have questions?  When is my next doctor's visit?  Where can I find a support group for people with diabetes? Where to find more information  American Diabetes Association: www.diabetes.org  American Association of Diabetes Educators: www.diabeteseducator.org/patient-resources Contact a doctor if:  Your blood sugar is at or above 240 mg/dL (13.3 mmol/L) for 2 days in a row.  You have  been sick or have had a fever for 2 days or more, and you are not getting better.  You have any of these problems for more than 6 hours: ? You cannot eat or drink. ? You feel sick to your stomach (nauseous). ? You throw up (vomit). ? You have watery poop (diarrhea). Get help right away if:  Your blood sugar is lower than 54 mg/dL (3 mmol/L).  You get confused.  You have trouble: ? Thinking clearly. ? Breathing. Summary  Diabetes (diabetes mellitus) is a long-term (chronic) disease. It occurs when the body does not properly use sugar (glucose) that is released from food after digestion.  Take insulin  and diabetes medicines as told.  Check your blood sugar every day, as often as told.  Keep all follow-up visits as told by your doctor. This is important. This information is not intended to replace advice given to you by your health care provider. Make sure you discuss any questions you have with your health care provider. Document Revised: 12/20/2018 Document Reviewed: 07/01/2017 Elsevier Patient Education  [place].

## 2019-08-27 ENCOUNTER — Other Ambulatory Visit: Payer: Self-pay | Admitting: Family Medicine

## 2019-08-27 MED ORDER — LISINOPRIL 40 MG PO TABS: 40.0000 mg | ORAL_TABLET | Freq: Every day | ORAL | 3 refills | Status: DC

## 2019-08-27 NOTE — Telephone Encounter (Signed)
RX REFILL lisinopril (ZESTRIL) 40 MG tablet  [place], [place] Phone:  [phone]  Fax:  [phone]

## 2019-08-31 ENCOUNTER — Telehealth: Payer: Self-pay

## 2019-08-31 ENCOUNTER — Ambulatory Visit
Admission: RE | Admit: 2019-08-31 | Discharge: 2019-08-31 | Disposition: A | Discharge status: Home / Self Care | Payer: Medicare Other | Source: Ambulatory Visit | Attending: Family Medicine | Admitting: Family Medicine

## 2019-08-31 DIAGNOSIS — Z1231 Encounter for screening mammogram for malignant neoplasm of breast: Secondary | ICD-10-CM

## 2019-08-31 LAB — HM DIABETES EYE EXAM

## 2019-08-31 NOTE — Telephone Encounter (Signed)
-----   Message from Virginia Crews, MD sent at 08/31/2019  3:16 PM EDT ----- Normal mammogram. Repeat in 1 yr

## 2019-08-31 NOTE — Telephone Encounter (Signed)
Patient advised as below.  

## 2019-09-20 ENCOUNTER — Encounter: Payer: Self-pay | Admitting: Family Medicine

## 2019-10-09 ENCOUNTER — Other Ambulatory Visit: Payer: Self-pay | Admitting: Family Medicine

## 2019-10-11 ENCOUNTER — Other Ambulatory Visit: Payer: Medicare Other

## 2019-11-12 ENCOUNTER — Ambulatory Visit: Payer: Medicare Other | Admitting: Family Medicine

## 2019-12-03 ENCOUNTER — Ambulatory Visit: Payer: Medicare Other | Admitting: Family Medicine

## 2019-12-06 ENCOUNTER — Ambulatory Visit (INDEPENDENT_AMBULATORY_CARE_PROVIDER_SITE_OTHER): Payer: Medicare Other | Admitting: Family Medicine

## 2019-12-06 ENCOUNTER — Encounter: Payer: Self-pay | Admitting: Family Medicine

## 2019-12-06 ENCOUNTER — Other Ambulatory Visit: Payer: Self-pay

## 2019-12-06 VITALS — BP 120/70 | HR 74 | Temp 98.0°F | Resp 16 | Wt 146.0 lb

## 2019-12-06 DIAGNOSIS — Z1211 Encounter for screening for malignant neoplasm of colon: Secondary | ICD-10-CM | POA: Diagnosis not present

## 2019-12-06 DIAGNOSIS — I10 Essential (primary) hypertension: Secondary | ICD-10-CM

## 2019-12-06 DIAGNOSIS — E1165 Type 2 diabetes mellitus with hyperglycemia: Secondary | ICD-10-CM | POA: Diagnosis not present

## 2019-12-06 DIAGNOSIS — Z780 Asymptomatic menopausal state: Secondary | ICD-10-CM

## 2019-12-06 DIAGNOSIS — I69359 Hemiplegia and hemiparesis following cerebral infarction affecting unspecified side: Secondary | ICD-10-CM

## 2019-12-06 DIAGNOSIS — R29898 Other symptoms and signs involving the musculoskeletal system: Secondary | ICD-10-CM

## 2019-12-06 DIAGNOSIS — N1832 Chronic kidney disease, stage 3b: Secondary | ICD-10-CM

## 2019-12-06 DIAGNOSIS — I69328 Other speech and language deficits following cerebral infarction: Secondary | ICD-10-CM

## 2019-12-06 LAB — POCT GLYCOSYLATED HEMOGLOBIN (HGB A1C)
Est. average glucose Bld gHb Est-mCnc: 157
Hemoglobin A1C: 7.1 % — AB (ref 4.0–5.6)

## 2019-12-06 MED ORDER — SITAGLIPTIN PHOSPHATE 50 MG PO TABS: 50.0000 mg | ORAL_TABLET | Freq: Every day | ORAL | 1 refills | Status: DC

## 2019-12-06 NOTE — Assessment & Plan Note (Addendum)
A1c 7.1 much improved from last visit Remains uncontrolled with hyperglycemia, but improving Goal A1c <7 Increase Januvia to 50 mg Continue other meds at current doses UTD on vaccines, eye exam, foot exam On ACEi On Statin Discussed diet and exercise F/u in 3 months and recheck A1c

## 2019-12-06 NOTE — Progress Notes (Signed)
Established patient visit   Patient: Kelli Gutierrez   DOB: 08-10-1950   69 y.o. Female  MRN: [id number] Visit Date: 12/06/2019  Today's healthcare provider: Lavon Paganini, MD   I,Sulibeya S Dimas,acting as a scribe for Lavon Paganini, MD.,have documented all relevant documentation on the behalf of Lavon Paganini, MD,as directed by  Lavon Paganini, MD while in the presence of Lavon Paganini, MD.  Chief Complaint  Patient presents with  . Diabetes  . Hypertension   Subjective    HPI  Diabetes Mellitus Type II, Follow-up  Lab Results  Component Value Date   HGBA1C 7.1 (A) 12/06/2019   HGBA1C 8.1 (A) 08/10/2019   HGBA1C 8.1 (H) 05/07/2019   Wt Readings from Last 3 Encounters:  12/06/19 146 lb (66.2 kg)  08/10/19 147 lb (66.7 kg)  06/18/19 147 lb (66.7 kg)   Last seen for diabetes 3 months ago.  Management since then includes increase Jardiance 25 mg daily. Continue metformin and Januvia at current dose. She reports excellent compliance with treatment. She is not having side effects.  Symptoms: No fatigue No foot ulcerations  No appetite changes No nausea  No paresthesia of the feet  No polydipsia  No polyuria No visual disturbances   No vomiting     Home blood sugar records: fasting range: 157 this morning  Episodes of hypoglycemia? No    Current insulin regiment: none Most Recent Eye Exam: UTD Current exercise: none Current diet habits: in general, a "healthy" diet    Pertinent Labs: Lab Results  Component Value Date   CHOL 174 05/07/2019   HDL 45 05/07/2019   LDLCALC 101 (H) 05/07/2019   TRIG 162 (H) 05/07/2019   CHOLHDL 3.9 05/07/2019   Lab Results  Component Value Date   NA 140 05/07/2019   K 4.6 05/07/2019   CREATININE 1.52 (H) 05/07/2019   GFRNONAA 35 (L) 05/07/2019   GFRAA 40 (L) 05/07/2019   GLUCOSE 91 05/07/2019      ---------------------------------------------------------------------------------------------------  Hypertension, follow-up  BP Readings from Last 3 Encounters:  12/06/19 120/70  08/10/19 (!) 158/88  06/18/19 135/85   Wt Readings from Last 3 Encounters:  12/06/19 146 lb (66.2 kg)  08/10/19 147 lb (66.7 kg)  06/18/19 147 lb (66.7 kg)     She was last seen for hypertension 3 months ago.  BP at that visit was 158/88. Management since that visit includes nephrology had recently added Amodipine 5 mg.  She reports excellent compliance with treatment. She is not having side effects.  She is following a Regular diet. She is not exercising. She does not smoke.  Use of agents associated with hypertension: none.   Outside blood pressures are not being checked. Symptoms: No chest pain No chest pressure  No palpitations No syncope  No dyspnea No orthopnea  No paroxysmal nocturnal dyspnea No lower extremity edema   Pertinent labs: Lab Results  Component Value Date   CHOL 174 05/07/2019   HDL 45 05/07/2019   LDLCALC 101 (H) 05/07/2019   TRIG 162 (H) 05/07/2019   CHOLHDL 3.9 05/07/2019   Lab Results  Component Value Date   NA 140 05/07/2019   K 4.6 05/07/2019   CREATININE 1.52 (H) 05/07/2019   GFRNONAA 35 (L) 05/07/2019   GFRAA 40 (L) 05/07/2019   GLUCOSE 91 05/07/2019     The ASCVD Risk score (Goff DC Jr., et al., 2013) failed to calculate for the following reasons:   The patient has a prior MI  or stroke diagnosis   ---------------------------------------------------------------------------------------------------  Patient Active Problem List   Diagnosis Date Noted  . Dyspnea on exertion 05/31/2019  . Dizziness 05/23/2019  . COPD, mild ([place]) 05/08/2019  . 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 Anemia in stage 4 chronic kidney disease ([place]) 05/08/2019  . Current moderate episode of major depressive disorder without  prior episode ([place]) 05/07/2019  . Neuropathic pain 09/14/2016  . 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 Vitamin D deficiency 06/23/2016  . Stage III chronic kidney disease 03/23/2016  . Type 2 diabetes mellitus with hyperglycemia, without long-term current use of insulin ([place]) 03/18/2016  . Rheumatoid arthritis 03/18/2016  . Current use of long term anticoagulation (Plavix) 03/18/2016  . Hyperlipidemia associated with type 2 diabetes mellitus ([place]) 03/18/2016  . GERD (gastroesophageal reflux disease) 03/18/2016  . Essential hypertension with goal blood pressure less than 130/80 09/30/2015  . History of CVA (cerebrovascular accident) 04/02/2015   Social History   Tobacco Use  . Smoking status: Never Smoker  . Smokeless tobacco: Never Used  Vaping Use  . Vaping Use: Never used  Substance Use Topics  . Alcohol use: No  . Drug use: Never   Allergies  Allergen Reactions  . Hydrocodone     Other reaction(s): Dizziness       Medications: Outpatient Medications Prior to Visit  Medication Sig  . amLODipine (NORVASC) 5 MG tablet Take 5 mg by mouth daily.   Marland Kitchen aspirin 81 MG chewable tablet Chew 81 mg by mouth daily.  Marland Kitchen atorvastatin (LIPITOR) 20 MG tablet Take 1 tablet (20 mg total) by mouth daily.  . clopidogrel (PLAVIX) 75 MG tablet Take 1 tablet (75 mg total) by mouth daily.  . empagliflozin (JARDIANCE) 25 MG TABS tablet Take 25 mg by mouth daily before breakfast.  . glucose blood (CONTOUR NEXT TEST) test strip Use to test blood glucose as directed 1-4 times daily  . hydroxychloroquine (PLAQUENIL) 200 MG tablet Take 200 mg by mouth daily.  Elmore Guise Devices MISC Use to test blood glucose as directed 1-4 times daily  . lisinopril (ZESTRIL) 40 MG tablet Take 1 tablet (40 mg total) by mouth daily.  . metFORMIN (GLUCOPHAGE-XR) 500 MG 24 hr tablet Take 500 mg by mouth 2 (two) times daily.   . metoprolol  succinate (TOPROL-XL) 25 MG 24 hr tablet Take 1 tablet (25 mg total) by mouth daily.  . Vitamin D, Ergocalciferol, (DRISDOL) 1.25 MG (50000 UNIT) CAPS capsule Take 1 capsule (50,000 Units total) by mouth every 7 (seven) days.  . [DISCONTINUED] JANUVIA 25 MG tablet Take 1 tablet by mouth once daily   No facility-administered medications prior to visit.    Review of Systems  Constitutional: Negative for appetite change, chills and fever.  Respiratory: Negative for cough and shortness of breath.   Cardiovascular: Negative for chest pain and palpitations.  Musculoskeletal: Positive for myalgias.      Objective    BP 120/70 (BP Location: Left Arm, Patient Position: Sitting, Cuff Size: Normal)   Pulse 74   Temp 98 F (36.7 C) (Oral)   Resp 16   Wt 146 lb (66.2 kg)   BMI 26.70 kg/m  BP Readings from Last 3 Encounters:  12/06/19 120/70  08/10/19 (!) 158/88  06/18/19 135/85   Wt Readings from Last 3 Encounters:  12/06/19 146 lb (66.2 kg)  08/10/19 147 lb (  66.7 kg)  06/18/19 147 lb (66.7 kg)      Physical Exam Vitals reviewed.  Constitutional:      General: She is not in acute distress.    Appearance: Normal appearance. She is well-developed. She is not diaphoretic.  HENT:     Head: Normocephalic and atraumatic.  Eyes:     General: No scleral icterus.    Conjunctiva/sclera: Conjunctivae normal.  Neck:     Thyroid: No thyromegaly.  Cardiovascular:     Rate and Rhythm: Normal rate and regular rhythm.     Pulses: Normal pulses.     Heart sounds: Normal heart sounds. No murmur heard.   Pulmonary:     Effort: Pulmonary effort is normal. No respiratory distress.     Breath sounds: Normal breath sounds. No wheezing, rhonchi or rales.  Musculoskeletal:     Cervical back: Neck supple.     Right lower leg: No edema.     Left lower leg: No edema.  Lymphadenopathy:     Cervical: No cervical adenopathy.  Skin:    General: Skin is warm and dry.     Findings: No rash.   Neurological:     Mental Status: She is alert and oriented to person, place, and time. Mental status is at baseline.  Psychiatric:        Mood and Affect: Mood normal.        Behavior: Behavior normal.      Results for orders placed or performed in visit on 12/06/19  POCT glycosylated hemoglobin (Hb A1C)  Result Value Ref Range   Hemoglobin A1C 7.1 (A) 4.0 - 5.6 %   Est. average glucose Bld gHb Est-mCnc 157     Assessment & Plan     Problem List Items Addressed This Visit      Cardiovascular and Mediastinum   Essential hypertension with goal blood pressure less than 130/80    Blood pressure improved from last visit Well controlled Continue current medications Recent metabolic panel with nephrology Follow up in 3 months        Endocrine   Type 2 diabetes mellitus with hyperglycemia, without long-term current use of insulin (HCC) - Primary    A1c 7.1 much improved from last visit Remains uncontrolled with hyperglycemia, but improving Goal A1c <7 Increase Januvia to 50 mg Continue other meds at current doses UTD on vaccines, eye exam, foot exam On ACEi On Statin Discussed diet and exercise F/u in 3 months and recheck A1c       Relevant Medications   sitaGLIPtin (JANUVIA) 50 MG tablet   Other Relevant Orders   POCT glycosylated hemoglobin (Hb A1C) (Completed)     Nervous and Auditory   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)    Chronic and worsening Right side weakness, pain and rigidity worsening Referral to Physical Medicine and Rehabilitation and Physiatry      Relevant Orders   Ambulatory referral to Physical Medicine Rehab     Genitourinary   Stage III chronic kidney disease    Followed by Nephrology  Continue ACEi       Other Visit Diagnoses    Colon cancer screening       Relevant Orders   Cologuard   Post-menopause       Relevant Orders   DG Bone Density   Rigidity       Relevant Orders   Ambulatory  referral to Physical Medicine Rehab  Return in about 3 months (around 03/07/2020) for chronic disease f/u.      I, Lavon Paganini, MD, have reviewed all documentation for this visit. The documentation on 12/07/19 for the exam, diagnosis, procedures, and orders are all accurate and complete.   Jayline Kilburg, Dionne Bucy, MD, MPH [facility]

## 2019-12-06 NOTE — Assessment & Plan Note (Signed)
Chronic and worsening Right side weakness, pain and rigidity worsening Referral to Physical Medicine and Rehabilitation and Physiatry

## 2019-12-06 NOTE — Patient Instructions (Signed)

## 2019-12-06 NOTE — Assessment & Plan Note (Addendum)
Blood pressure improved from last visit Well controlled Continue current medications Recent metabolic panel with nephrology Follow up in 3 months

## 2019-12-07 NOTE — Assessment & Plan Note (Signed)
Followed by Nephrology  Continue ACEi

## 2019-12-24 ENCOUNTER — Other Ambulatory Visit: Payer: Self-pay | Admitting: Family Medicine

## 2019-12-25 ENCOUNTER — Other Ambulatory Visit: Payer: Self-pay | Admitting: Family Medicine

## 2019-12-25 DIAGNOSIS — Z1231 Encounter for screening mammogram for malignant neoplasm of breast: Secondary | ICD-10-CM

## 2020-01-03 ENCOUNTER — Other Ambulatory Visit: Payer: Self-pay

## 2020-01-03 ENCOUNTER — Ambulatory Visit
Admission: RE | Admit: 2020-01-03 | Discharge: 2020-01-03 | Disposition: A | Discharge status: Home / Self Care | Payer: Medicare Other | Source: Ambulatory Visit | Attending: Family Medicine | Admitting: Family Medicine

## 2020-01-03 DIAGNOSIS — Z780 Asymptomatic menopausal state: Secondary | ICD-10-CM | POA: Insufficient documentation

## 2020-01-07 ENCOUNTER — Telehealth: Payer: Self-pay

## 2020-01-07 NOTE — Telephone Encounter (Signed)
Pt advised.   Thanks,   -Jaycen Vercher  

## 2020-01-07 NOTE — Telephone Encounter (Signed)
-----   Message from Virginia Crews, MD sent at 01/07/2020  3:21 PM EDT ----- Bone density scan shows osteopenia (this is some bone loss, but not as bad as osteoporosis).  Recommend regular weight bearing exercise, avoiding smoking, and adequate Ca (1200mg /day) and Vit D (1000 units daily) via diet or supplement.  We will recheck in 2 years to ensure this hasn't worsened.

## 2020-01-08 ENCOUNTER — Telehealth: Payer: Self-pay

## 2020-01-08 NOTE — Telephone Encounter (Unsigned)
Copied from [place] [id number]. Topic: General - Other >> Jan 08, 2020  4:12 PM Oneta Rack wrote: Osvaldo Human name: Judson Roch  Relation to pt:  [employer]  Call back number: [phone]   Reason for call:  [employer] is unable to reach patient and would like practice to reach out. [employer] is trying to confirm if patient received the cologuard. The # in the system is stating disconnected

## 2020-01-30 ENCOUNTER — Ambulatory Visit: Payer: Self-pay | Admitting: *Deleted

## 2020-01-30 NOTE — Chronic Care Management (AMB) (Signed)
CCM enrollment status changed to previously enrolled.    Elliot Gurney, [place] Worker  [facility] Care Management [phone]

## 2020-02-12 LAB — COLOGUARD: Cologuard: NEGATIVE

## 2020-02-19 ENCOUNTER — Other Ambulatory Visit: Payer: Self-pay | Admitting: Family Medicine

## 2020-02-26 LAB — EXTERNAL GENERIC LAB PROCEDURE: COLOGUARD: NEGATIVE

## 2020-02-26 LAB — COLOGUARD: COLOGUARD: NEGATIVE

## 2020-02-29 ENCOUNTER — Other Ambulatory Visit: Payer: Self-pay

## 2020-02-29 DIAGNOSIS — Z1231 Encounter for screening mammogram for malignant neoplasm of breast: Secondary | ICD-10-CM

## 2020-02-29 LAB — COLOGUARD: Cologuard: NEGATIVE

## 2020-03-10 ENCOUNTER — Other Ambulatory Visit: Payer: Self-pay

## 2020-03-10 ENCOUNTER — Encounter: Payer: Self-pay | Admitting: Family Medicine

## 2020-03-10 ENCOUNTER — Ambulatory Visit (INDEPENDENT_AMBULATORY_CARE_PROVIDER_SITE_OTHER): Payer: Medicare Other | Admitting: Family Medicine

## 2020-03-10 VITALS — BP 134/72 | HR 68 | Temp 97.6°F | Resp 16 | Wt 149.0 lb

## 2020-03-10 DIAGNOSIS — N1832 Chronic kidney disease, stage 3b: Secondary | ICD-10-CM

## 2020-03-10 DIAGNOSIS — E1165 Type 2 diabetes mellitus with hyperglycemia: Secondary | ICD-10-CM

## 2020-03-10 DIAGNOSIS — E1159 Type 2 diabetes mellitus with other circulatory complications: Secondary | ICD-10-CM | POA: Diagnosis not present

## 2020-03-10 DIAGNOSIS — G890 Central pain syndrome: Secondary | ICD-10-CM

## 2020-03-10 DIAGNOSIS — I152 Hypertension secondary to endocrine disorders: Secondary | ICD-10-CM

## 2020-03-10 DIAGNOSIS — E785 Hyperlipidemia, unspecified: Secondary | ICD-10-CM

## 2020-03-10 DIAGNOSIS — Z23 Encounter for immunization: Secondary | ICD-10-CM | POA: Diagnosis not present

## 2020-03-10 DIAGNOSIS — E1169 Type 2 diabetes mellitus with other specified complication: Secondary | ICD-10-CM

## 2020-03-10 DIAGNOSIS — F321 Major depressive disorder, single episode, moderate: Secondary | ICD-10-CM

## 2020-03-10 LAB — POCT GLYCOSYLATED HEMOGLOBIN (HGB A1C)
Est. average glucose Bld gHb Est-mCnc: 174
Hemoglobin A1C: 7.7 % — AB (ref 4.0–5.6)

## 2020-03-10 MED ORDER — GLIPIZIDE 5 MG PO TABS
2.5000 mg | ORAL_TABLET | Freq: Every day | ORAL | 3 refills | Status: DC
Start: 2020-03-10 — End: 2020-09-29

## 2020-03-10 MED ORDER — GABAPENTIN 100 MG PO CAPS: 100.0000 mg | ORAL_CAPSULE | Freq: Three times a day (TID) | ORAL | 3 refills | Status: DC

## 2020-03-10 NOTE — Assessment & Plan Note (Signed)
Follow by nephrology Continue ACEi RFP

## 2020-03-10 NOTE — Assessment & Plan Note (Signed)
Blood pressure 134.72 today Previously well controlled Consider increased to amlodipine 10 mg next visit.  RFP

## 2020-03-10 NOTE — Assessment & Plan Note (Signed)
Pain has continued to increase over the last 5 years since her stoke in 2016 Still able to ambulate Feels tingles and some decreased sensation on her R side Gabapentin did not help previously Continue to monitor

## 2020-03-10 NOTE — Progress Notes (Signed)
Established patient visit   Patient: Kelli Gutierrez   DOB: 1950/05/01   69 y.o. Female  MRN: [id number] Visit Date: 03/10/2020  Today's healthcare provider: Lavon Paganini, MD   Chief Complaint  Patient presents with  . Diabetes   Subjective    HPI   Vila is doing poorly. She describes her mood as 'sad'. She feels tired easily and it is challenging for her to accomplish daily tasks. Her diet fluctuates, it is easy for her to lose weight and also gain it. She has not been sleeping well, but has never slept well in her life. Her joints continue to hurt, she gets some relief with tylenol and does not like to get steroid injections. She has experienced no dizziness, syncopal episodes or N/V/D. She has had no SI, or A/V/H.  Social History   Tobacco Use  . Smoking status: Never Smoker  . Smokeless tobacco: Never Used  Vaping Use  . Vaping Use: Never used  Substance Use Topics  . Alcohol use: No  . Drug use: Never       Medications: Outpatient Medications Prior to Visit  Medication Sig  . amLODipine (NORVASC) 5 MG tablet Take 5 mg by mouth daily.   Marland Kitchen aspirin 81 MG chewable tablet Chew 81 mg by mouth daily.  Marland Kitchen atorvastatin (LIPITOR) 20 MG tablet Take 1 tablet (20 mg total) by mouth daily.  . clopidogrel (PLAVIX) 75 MG tablet Take 1 tablet (75 mg total) by mouth daily.  Marland Kitchen glucose blood (CONTOUR NEXT TEST) test strip Use to test blood glucose as directed 1-4 times daily  . hydroxychloroquine (PLAQUENIL) 200 MG tablet Take 200 mg by mouth daily.  Marland Kitchen JARDIANCE 25 MG TABS tablet TAKE 1 TABLET BY MOUTH ONCE DAILY BEFORE BREAKFAST  . Lancet Devices MISC Use to test blood glucose as directed 1-4 times daily  . lisinopril (ZESTRIL) 40 MG tablet Take 1 tablet (40 mg total) by mouth daily.  . metFORMIN (GLUCOPHAGE-XR) 500 MG 24 hr tablet Take 500 mg by mouth 2 (two) times daily.   . metoprolol succinate (TOPROL-XL) 25 MG 24 hr tablet Take 1 tablet by mouth once daily  . patiromer  (VELTASSA) 8.4 g packet Take 8.4 g by mouth daily.  . sevelamer carbonate (RENVELA) 800 MG tablet Take 800 mg by mouth 3 (three) times daily with meals.   . sitaGLIPtin (JANUVIA) 50 MG tablet Take 1 tablet (50 mg total) by mouth daily.  . Vitamin D, Ergocalciferol, (DRISDOL) 1.25 MG (50000 UNIT) CAPS capsule Take 1 capsule (50,000 Units total) by mouth every 7 (seven) days.   No facility-administered medications prior to visit.    Review of Systems  Constitutional: Negative for activity change, diaphoresis, fatigue and fever.  HENT: Negative.   Respiratory: Negative.   Cardiovascular: Negative.   Psychiatric/Behavioral: Positive for dysphoric mood.       Objective    BP 134/72 (BP Location: Left Arm, Patient Position: Sitting, Cuff Size: Normal)   Pulse 68   Temp 97.6 F (36.4 C) (Oral)   Resp 16   Wt 149 lb (67.6 kg)   SpO2 99%   BMI 27.25 kg/m    Physical Exam   General: NAD, conversational Cards: RRR, no murmurs rubs or gallops Pulm: CTA Bilaterally Psych  Mood: 'Alright'  Affect: Dysthymic  Conversation: Logical, tangential  Thought Process: clear and coherent  Thought Content: No SI or HI, No A/V/H  Judgement: Intact   Results for orders placed or performed  in visit on 03/10/20  POCT glycosylated hemoglobin (Hb A1C)  Result Value Ref Range   Hemoglobin A1C 7.7 (A) 4.0 - 5.6 %   Est. average glucose Bld gHb Est-mCnc 174     Assessment & Plan     Problem List Items Addressed This Visit      Cardiovascular and Mediastinum   Hypertension associated with diabetes ([place])    Blood pressure 134.72 today Previously well controlled Consider increased to amlodipine 10 mg next visit.  RFP      Relevant Medications   glipiZIDE (GLUCOTROL) 5 MG tablet   Other Relevant Orders   Comprehensive metabolic panel     Endocrine   Type 2 diabetes mellitus with hyperglycemia, without long-term current use of insulin (HCC) - Primary    Remains uncontrolled, blood  sugar has increased to 7.7 from 7.1 in august. 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)    Pain has continued to increase over the last 5 years since her stoke in 2016 Still able to ambulate Feels tingles and some decreased sensation on her R side Gabapentin did not help previously Continue to monitor        Genitourinary   Stage III chronic kidney disease ([place])    Follow by nephrology Continue ACEi RFP      Relevant Orders   Comprehensive metabolic panel     Other   Current moderate episode of major depressive disorder without prior episode ([place])    Chronic Moderate MDD Symtpoms Tearful today No SI or A/V/H Declines medications Encouraged to reach out to Education officer, museum or therapist Continue to monitor      Relevant Orders   Ambulatory referral to [place]   TSH       Return in about 3 months (around 06/09/2020) for chronic disease f/u.      Patient seen along with [employer] student [facility] I personally evaluated this patient along with the student, and verified all aspects of the history, physical exam, and medical decision making as documented by the student. I agree with the student's documentation and have made all necessary edits.  Rhyli Depaula, Dionne Bucy, MD, MPH [facility]

## 2020-03-10 NOTE — Assessment & Plan Note (Addendum)
Remains uncontrolled, blood sugar has increased to 7.7 from 7.1 in august. Patient is adverse to injecting herself, cannot increase Januvia dose bc of kidney function Start on glipizide Needs flu shot Covid booster today On ACEi On Statin Discussed diet and exercise Check FLP Recheck A1C in three months

## 2020-03-10 NOTE — Assessment & Plan Note (Addendum)
Chronic Moderate MDD Symtpoms Tearful today No SI or A/V/H Declines medications Encouraged to reach out to Education officer, museum or therapist Continue to monitor

## 2020-03-10 NOTE — Patient Instructions (Addendum)
Continue current meds Add glipizide 2.5 mg daily with breakfast (be sure to take with food)  Try gabapentin 100mg  three times daily

## 2020-03-14 ENCOUNTER — Telehealth: Payer: Self-pay

## 2020-03-14 LAB — LIPID PANEL
Chol/HDL Ratio: 1.9 ratio (ref 0.0–4.4)
Cholesterol, Total: 90 mg/dL — ABNORMAL LOW (ref 100–199)
HDL: 47 mg/dL (ref >39)
LDL Chol Calc (NIH): 29 mg/dL (ref 0–99)
Triglycerides: 62 mg/dL (ref 0–149)
VLDL Cholesterol Cal: 14 mg/dL (ref 5–40)

## 2020-03-14 LAB — COMPREHENSIVE METABOLIC PANEL
ALT: 50 IU/L — ABNORMAL HIGH (ref 0–32)
AST: 37 IU/L (ref 0–40)
Albumin/Globulin Ratio: 1.1 — ABNORMAL LOW (ref 1.2–2.2)
Albumin: 4 g/dL (ref 3.8–4.8)
Alkaline Phosphatase: 115 IU/L (ref 44–121)
BUN/Creatinine Ratio: 20 (ref 12–28)
BUN: 45 mg/dL — ABNORMAL HIGH (ref 8–27)
Bilirubin Total: 0.4 mg/dL (ref 0.0–1.2)
CO2: 18 mmol/L — ABNORMAL LOW (ref 20–29)
Calcium: 9.1 mg/dL (ref 8.7–10.3)
Chloride: 107 mmol/L — ABNORMAL HIGH (ref 96–106)
Creatinine, Ser: 2.22 mg/dL — ABNORMAL HIGH (ref 0.57–1.00)
GFR calc Af Amer: 25 mL/min/{1.73_m2} — ABNORMAL LOW (ref >59)
GFR calc non Af Amer: 22 mL/min/{1.73_m2} — ABNORMAL LOW (ref >59)
Globulin, Total: 3.6 g/dL (ref 1.5–4.5)
Glucose: 143 mg/dL — ABNORMAL HIGH (ref 65–99)
Potassium: 5.7 mmol/L — ABNORMAL HIGH (ref 3.5–5.2)
Sodium: 139 mmol/L (ref 134–144)
Total Protein: 7.6 g/dL (ref 6.0–8.5)

## 2020-03-14 LAB — TSH: TSH: 1.81 u[IU]/mL (ref 0.450–4.500)

## 2020-03-14 NOTE — Telephone Encounter (Signed)
Copied from [place] [id number]. Topic: Referral - Status >> Mar 14, 2020  9:20 AM Scherrie Gerlach wrote: Reason for CRM: [facility] cannot take [facility] pt on referral.  She is out of their age range.

## 2020-03-14 NOTE — Addendum Note (Signed)
Addended by: Ashley Royalty E on: 03/14/2020 09:48 AM   Modules accepted: Orders

## 2020-03-17 ENCOUNTER — Telehealth: Payer: Self-pay

## 2020-03-17 NOTE — Telephone Encounter (Signed)
Can we send the referral elsewhere?

## 2020-03-17 NOTE — Telephone Encounter (Signed)
-----   Message from Virginia Crews, MD sent at 03/14/2020 10:56 AM EST ----- Increase in creatinine (kidney function) and potassium.  Be sure to follow up with nephrology.  Stay hydrated.

## 2020-03-17 NOTE — Telephone Encounter (Signed)
Pt advised.   Thanks,   -Tamey Wanek  

## 2020-03-23 ENCOUNTER — Other Ambulatory Visit: Payer: Self-pay | Admitting: Family Medicine

## 2020-03-23 NOTE — Telephone Encounter (Signed)
Requested Prescriptions  Pending Prescriptions Disp Refills   JARDIANCE 25 MG TABS tablet [Pharmacy Med Name: Jardiance 25 MG Oral Tablet] 90 tablet 0    Sig: TAKE 1 TABLET BY MOUTH ONCE DAILY BEFORE BREAKFAST     Endocrinology:  Diabetes - SGLT2 Inhibitors Failed - 03/23/2020  9:18 AM      Failed - Cr in normal range and within 360 days    Creatinine  Date Value Ref Range Status  10/10/2012 0.93 0.60 - 1.30 mg/dL Final   Creatinine, Ser  Date Value Ref Range Status  03/13/2020 2.22 (H) 0.57 - 1.00 mg/dL Final         Failed - LDL in normal range and within 360 days    LDL Chol Calc (NIH)  Date Value Ref Range Status  03/13/2020 29 0 - 99 mg/dL Final         Failed - eGFR in normal range and within 360 days    EGFR (African American)  Date Value Ref Range Status  10/10/2012 >60  Final   GFR calc Af Amer  Date Value Ref Range Status  03/13/2020 25 (L) >59 mL/min/1.73 Final    Comment:    **In accordance with recommendations from the NKF-ASN Task force,**   [facility] is in the process of updating its eGFR calculation to the   2021 CKD-EPI creatinine equation that estimates kidney function   without a race variable.    EGFR (Non-African Amer.)  Date Value Ref Range Status  10/10/2012 >60  Final    Comment:    eGFR values <12m/min/1.73 m2 may be an indication of chronic kidney disease (CKD). Calculated eGFR is useful in patients with stable renal function. The eGFR calculation will not be reliable in acutely ill patients when serum creatinine is changing rapidly. It is not useful in  patients on dialysis. The eGFR calculation may not be applicable to patients at the low and high extremes of body sizes, pregnant women, and vegetarians.    GFR calc non Af Amer  Date Value Ref Range Status  03/13/2020 22 (L) >59 mL/min/1.73 Final         Passed - HBA1C is between 0 and 7.9 and within 180 days    Hemoglobin A1C  Date Value Ref Range Status  03/10/2020 7.7 (A) 4.0 -  5.6 % Final   Hgb A1c MFr Bld  Date Value Ref Range Status  05/07/2019 8.1 (H) 4.8 - 5.6 % Final    Comment:             Prediabetes: 5.7 - 6.4          Diabetes: >6.4          Glycemic control for adults with diabetes: <7.0          Passed - Valid encounter within last 6 months    Recent Outpatient Visits          1 week ago Type 2 diabetes mellitus with hyperglycemia, without long-term current use of insulin ([facility]   [facility]   3 months ago Type 2 diabetes mellitus with hyperglycemia, without long-term current use of insulin ([facility]   [facility]   7 months ago Essential hypertension with goal blood pressure less than 130/80   [facility]   9 months ago Essential hypertension with goal blood pressure less than 130/80   [facility]  9 months ago Type 2 diabetes mellitus with hyperglycemia, without long-term current use of insulin [facility])   [facility] Bacigalupo, Dionne Bucy, MD      Future Appointments            In 2 months Bacigalupo, Dionne Bucy, MD [facility] [place]

## 2020-03-24 ENCOUNTER — Encounter: Payer: Self-pay | Admitting: Family Medicine

## 2020-03-24 ENCOUNTER — Ambulatory Visit (INDEPENDENT_AMBULATORY_CARE_PROVIDER_SITE_OTHER): Payer: Medicare Other | Admitting: Family Medicine

## 2020-03-24 ENCOUNTER — Other Ambulatory Visit: Payer: Self-pay

## 2020-03-24 DIAGNOSIS — Z23 Encounter for immunization: Secondary | ICD-10-CM

## 2020-03-24 NOTE — Progress Notes (Signed)
Patient here for flu vaccination only.  I did not examine the patient.  I did review his medical history, medications, and allergies and vaccine consent form.  CMA gave vaccination. Patient tolerated well.  Virginia Crews, MD, MPH [facility] 03/24/2020 11:53 AM

## 2020-03-25 ENCOUNTER — Other Ambulatory Visit: Payer: Self-pay | Admitting: Family Medicine

## 2020-05-31 ENCOUNTER — Other Ambulatory Visit: Payer: Self-pay | Admitting: Family Medicine

## 2020-05-31 DIAGNOSIS — Z8673 Personal history of transient ischemic attack (TIA), and cerebral infarction without residual deficits: Secondary | ICD-10-CM

## 2020-05-31 NOTE — Telephone Encounter (Signed)
r 

## 2020-06-02 ENCOUNTER — Other Ambulatory Visit: Payer: Self-pay | Admitting: Family Medicine

## 2020-06-02 DIAGNOSIS — E1165 Type 2 diabetes mellitus with hyperglycemia: Secondary | ICD-10-CM

## 2020-06-09 ENCOUNTER — Ambulatory Visit (INDEPENDENT_AMBULATORY_CARE_PROVIDER_SITE_OTHER): Payer: Medicare Other | Admitting: Licensed Clinical Social Worker

## 2020-06-09 DIAGNOSIS — F411 Generalized anxiety disorder: Secondary | ICD-10-CM

## 2020-06-09 DIAGNOSIS — F321 Major depressive disorder, single episode, moderate: Secondary | ICD-10-CM | POA: Diagnosis not present
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
## 2020-06-16 ENCOUNTER — Ambulatory Visit: Payer: Medicare Other | Admitting: Family Medicine

## 2020-06-24 ENCOUNTER — Encounter: Payer: Self-pay | Admitting: Family Medicine

## 2020-06-24 ENCOUNTER — Ambulatory Visit (INDEPENDENT_AMBULATORY_CARE_PROVIDER_SITE_OTHER): Payer: Medicare Other | Admitting: Family Medicine

## 2020-06-24 ENCOUNTER — Other Ambulatory Visit: Payer: Self-pay

## 2020-06-24 VITALS — BP 132/76 | HR 69 | Temp 98.0°F | Resp 16 | Wt 151.2 lb

## 2020-06-24 DIAGNOSIS — E1159 Type 2 diabetes mellitus with other circulatory complications: Secondary | ICD-10-CM

## 2020-06-24 DIAGNOSIS — E1122 Type 2 diabetes mellitus with diabetic chronic kidney disease: Secondary | ICD-10-CM | POA: Diagnosis not present

## 2020-06-24 DIAGNOSIS — I152 Hypertension secondary to endocrine disorders: Secondary | ICD-10-CM

## 2020-06-24 DIAGNOSIS — N1832 Chronic kidney disease, stage 3b: Secondary | ICD-10-CM | POA: Diagnosis not present

## 2020-06-24 DIAGNOSIS — J449 Chronic obstructive pulmonary disease, unspecified: Secondary | ICD-10-CM | POA: Diagnosis not present

## 2020-06-24 LAB — POCT GLYCOSYLATED HEMOGLOBIN (HGB A1C)
Est. average glucose Bld gHb Est-mCnc: 160
Hemoglobin A1C: 7.2 % — AB (ref 4.0–5.6)

## 2020-06-24 NOTE — Progress Notes (Signed)
Established patient visit   Patient: Kelli Gutierrez   DOB: 1950/11/06   71 y.o. Female  MRN: [id number] Visit Date: 06/24/2020  Today's healthcare provider: Lavon Paganini, MD   Chief Complaint  Patient presents with  . Diabetes   Subjective    HPI  Diabetes Mellitus Type II, Follow-up  Lab Results  Component Value Date   HGBA1C 7.2 (A) 06/24/2020   HGBA1C 7.7 (A) 03/10/2020   HGBA1C 7.1 (A) 12/06/2019   Wt Readings from Last 3 Encounters:  06/24/20 151 lb 3.2 oz (68.6 kg)  03/10/20 149 lb (67.6 kg)  12/06/19 146 lb (66.2 kg)   Last seen for diabetes 3 months ago.  Management since then includes start on Glipizide. She reports excellent compliance with treatment. She is not having side effects.  Symptoms: No fatigue No foot ulcerations  No appetite changes No nausea  No paresthesia of the feet  No polydipsia  No polyuria No visual disturbances   No vomiting     Home blood sugar records: fasting range: 150's-190's  Episodes of hypoglycemia? Yes sweating, shaky    Current insulin regiment: none Most Recent Eye Exam: UTD Current exercise: none Current diet habits: in general, a "healthy" diet    Pertinent Labs: Lab Results  Component Value Date   CHOL 90 (L) 03/13/2020   HDL 47 03/13/2020   LDLCALC 29 03/13/2020   TRIG 62 03/13/2020   CHOLHDL 1.9 03/13/2020   Lab Results  Component Value Date   NA 139 03/13/2020   K 5.7 (H) 03/13/2020   CREATININE 2.22 (H) 03/13/2020   GFRNONAA 22 (L) 03/13/2020   GFRAA 25 (L) 03/13/2020   GLUCOSE 143 (H) 03/13/2020     --------------------------------------------------------------------------------------------------- Hypertension, follow-up  BP Readings from Last 3 Encounters:  06/24/20 132/76  03/10/20 134/72  12/06/19 120/70   Wt Readings from Last 3 Encounters:  06/24/20 151 lb 3.2 oz (68.6 kg)  03/10/20 149 lb (67.6 kg)  12/06/19 146 lb (66.2 kg)     She was last seen for hypertension 3 months  ago.  BP at that visit was 134/72. Management since that visit includes no changes.  She reports excellent compliance with treatment. She is not having side effects. She is following a Low Sodium diet. She is not exercising. She does not smoke.  Use of agents associated with hypertension: none.   Outside blood pressures are stable. Symptoms: No chest pain No chest pressure  No palpitations No syncope  No dyspnea No orthopnea  No paroxysmal nocturnal dyspnea No lower extremity edema   Pertinent labs: Lab Results  Component Value Date   CHOL 90 (L) 03/13/2020   HDL 47 03/13/2020   LDLCALC 29 03/13/2020   TRIG 62 03/13/2020   CHOLHDL 1.9 03/13/2020   Lab Results  Component Value Date   NA 139 03/13/2020   K 5.7 (H) 03/13/2020   CREATININE 2.22 (H) 03/13/2020   GFRNONAA 22 (L) 03/13/2020   GFRAA 25 (L) 03/13/2020   GLUCOSE 143 (H) 03/13/2020     The ASCVD Risk score Mikey Bussing DC Jr., et al., 2013) failed to calculate for the following reasons:   The patient has a prior MI or stroke diagnosis   ---------------------------------------------------------------------------------------------------   Patient Active Problem List   Diagnosis Date Noted  . Dyspnea on exertion 05/31/2019  . Dizziness 05/23/2019  . COPD, mild ([place]) 05/08/2019  . 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 Anemia  in stage 4 chronic kidney disease ([place]) 05/08/2019  . 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 Neuropathic pain 09/14/2016  . 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 Vitamin D deficiency 06/23/2016  . Stage III chronic kidney disease ([place]) 03/23/2016  . Type 2 diabetes mellitus with hyperglycemia, without long-term current use of insulin ([place]) 03/18/2016  . Rheumatoid arthritis 03/18/2016  . Current use of  long term anticoagulation (Plavix) 03/18/2016  . Hyperlipidemia associated with type 2 diabetes mellitus ([place]) 03/18/2016  . GERD (gastroesophageal reflux disease) 03/18/2016  . Hypertension associated with diabetes ([place]) 09/30/2015  . History of CVA (cerebrovascular accident) 04/02/2015   Social History   Tobacco Use  . Smoking status: Never Smoker  . Smokeless tobacco: Never Used  Vaping Use  . Vaping Use: Never used  Substance Use Topics  . Alcohol use: No  . Drug use: Never   Allergies  Allergen Reactions  . Hydrocodone     Other reaction(s): Dizziness       Medications: Outpatient Medications Prior to Visit  Medication Sig  . amLODipine (NORVASC) 5 MG tablet Take 5 mg by mouth daily.   Marland Kitchen aspirin 81 MG chewable tablet Chew 81 mg by mouth daily.  Marland Kitchen atorvastatin (LIPITOR) 20 MG tablet Take 1 tablet (20 mg total) by mouth daily.  . clopidogrel (PLAVIX) 75 MG tablet Take 1 tablet (75 mg total) by mouth daily.  Marland Kitchen gabapentin (NEURONTIN) 100 MG capsule Take 1 capsule (100 mg total) by mouth 3 (three) times daily.  Marland Kitchen glipiZIDE (GLUCOTROL) 5 MG tablet Take 0.5 tablets (2.5 mg total) by mouth daily before breakfast.  . glucose blood (CONTOUR NEXT TEST) test strip Use to test blood glucose as directed 1-4 times daily  . hydroxychloroquine (PLAQUENIL) 200 MG tablet Take 200 mg by mouth daily.  Marland Kitchen JANUVIA 50 MG tablet Take 1 tablet by mouth once daily  . JARDIANCE 25 MG TABS tablet TAKE 1 TABLET BY MOUTH ONCE DAILY BEFORE BREAKFAST  . [employer] MISC Use to test blood glucose as directed 1-4 times daily  . lisinopril (ZESTRIL) 40 MG tablet Take 1 tablet (40 mg total) by mouth daily.  . metoprolol succinate (TOPROL-XL) 25 MG 24 hr tablet Take 1 tablet by mouth once daily  . patiromer (VELTASSA) 8.4 g packet Take 8.4 g by mouth daily.  . sevelamer carbonate (RENVELA) 800 MG tablet Take 800 mg by mouth 3 (three) times daily with meals.   . [DISCONTINUED] Vitamin D, Ergocalciferol,  (DRISDOL) 1.25 MG (50000 UNIT) CAPS capsule Take 1 capsule (50,000 Units total) by mouth every 7 (seven) days. (Patient not taking: Reported on 06/24/2020)   No facility-administered medications prior to visit.    Review of Systems  Constitutional: Negative.   Eyes: Negative.   Respiratory: Negative.   Cardiovascular: Negative.     Last CBC Lab Results  Component Value Date   WBC 7.3 05/07/2019   HGB 11.8 05/07/2019   HCT 35.8 05/07/2019   MCV 88 05/07/2019   MCH 29.1 05/07/2019   RDW 13.2 05/07/2019   PLT 238 05/07/2019   Last thyroid functions Lab Results  Component Value Date   TSH 1.810 03/13/2020        Objective    BP 132/76 (BP Location: Left Arm, Patient Position: Sitting, Cuff Size: Normal)   Pulse 69   Temp 98 F (36.7 C) (Oral)  Resp 16   Wt 151 lb 3.2 oz (68.6 kg)   SpO2 99%   BMI 27.65 kg/m  BP Readings from Last 3 Encounters:  06/24/20 132/76  03/10/20 134/72  12/06/19 120/70   Wt Readings from Last 3 Encounters:  06/24/20 151 lb 3.2 oz (68.6 kg)  03/10/20 149 lb (67.6 kg)  12/06/19 146 lb (66.2 kg)      Physical Exam Vitals reviewed.  Constitutional:      General: She is not in acute distress.    Appearance: Normal appearance. She is well-developed. She is not diaphoretic.  HENT:     Head: Normocephalic and atraumatic.  Eyes:     General: No scleral icterus.    Conjunctiva/sclera: Conjunctivae normal.  Neck:     Thyroid: No thyromegaly.  Cardiovascular:     Rate and Rhythm: Normal rate and regular rhythm.     Pulses: Normal pulses.     Heart sounds: Normal heart sounds. No murmur heard.   Pulmonary:     Effort: Pulmonary effort is normal. No respiratory distress.     Breath sounds: Normal breath sounds. No wheezing, rhonchi or rales.  Musculoskeletal:     Cervical back: Neck supple.     Right lower leg: No edema.     Left lower leg: No edema.  Lymphadenopathy:     Cervical: No cervical adenopathy.  Skin:    General: Skin  is warm and dry.     Findings: No rash.  Neurological:     Mental Status: She is alert and oriented to person, place, and time. Mental status is at baseline.  Psychiatric:        Mood and Affect: Mood normal.        Behavior: Behavior normal.       Results for orders placed or performed in visit on 06/24/20  POCT glycosylated hemoglobin (Hb A1C)  Result Value Ref Range   Hemoglobin A1C 7.2 (A) 4.0 - 5.6 %   Est. average glucose Bld gHb Est-mCnc 160     Assessment & Plan     Problem List Items Addressed This Visit      Cardiovascular and Mediastinum   Hypertension associated with diabetes ([place])    Well controlled Continue current medications Recheck metabolic panel F/u in 6 months         Respiratory   COPD, mild ([place])    Chronic and well controlled Not currently on medications        Endocrine   Type 2 diabetes mellitus with hyperglycemia, without long-term current use of insulin ([facility]) - Primary    Improving significantly Consider goal A1c <7.5 as she is at risk for hypoglycemia No changes to meds today Foot exam today Continue statin        1. Type 2 diabetes mellitus with stage 3b chronic kidney disease, without long-term current use of insulin ([place])   Return in about 3 months (around 09/24/2020) for AWV, CPE.      I, Lavon Paganini, MD, have reviewed all documentation for this visit. The documentation on 06/24/20 for the exam, diagnosis, procedures, and orders are all accurate and complete.   Mirtie Bastyr, Dionne Bucy, MD, MPH [facility]

## 2020-06-24 NOTE — Assessment & Plan Note (Signed)
Improving significantly Consider goal A1c <7.5 as she is at risk for hypoglycemia No changes to meds today Foot exam today Continue statin

## 2020-06-24 NOTE — Assessment & Plan Note (Signed)
Well controlled Continue current medications Recheck metabolic panel F/u in 6 months  

## 2020-06-24 NOTE — Assessment & Plan Note (Signed)
Chronic and well controlled Not currently on medications

## 2020-07-01 ENCOUNTER — Other Ambulatory Visit: Payer: Self-pay | Admitting: Family Medicine

## 2020-07-01 NOTE — Telephone Encounter (Signed)
   Notes to clinic: this script is expired  Review for continued use and new script    Requested Prescriptions  Pending Prescriptions Disp Refills   metoprolol succinate (TOPROL-XL) 25 MG 24 hr tablet [Pharmacy Med Name: Metoprolol Succinate ER 25 MG Oral Tablet Extended Release 24 Hour] 90 tablet 0    Sig: Take 1 tablet by mouth once daily      Cardiovascular:  Beta Blockers Passed - 07/01/2020  9:52 AM      Passed - Last BP in normal range    BP Readings from Last 1 Encounters:  06/24/20 132/76          Passed - Last Heart Rate in normal range    Pulse Readings from Last 1 Encounters:  06/24/20 69          Passed - Valid encounter within last 6 months    Recent Outpatient Visits           1 week ago Type 2 diabetes mellitus with stage 3b chronic kidney disease, without long-term current use of insulin ([place])   [facility] [place], Dionne Bucy, MD   3 months ago Need for immunization against influenza   [facility] [place], Dionne Bucy, MD   3 months ago Type 2 diabetes mellitus with hyperglycemia, without long-term current use of insulin [facility])   [facility], Dionne Bucy, MD   6 months ago Type 2 diabetes mellitus with hyperglycemia, without long-term current use of insulin [facility])   [facility], Dionne Bucy, MD   10 months ago Essential hypertension with goal blood pressure less than 130/80   [facility], Dionne Bucy, MD       Future Appointments             In 3 months Bacigalupo, Dionne Bucy, MD [facility], [facility]

## 2020-07-15 ENCOUNTER — Ambulatory Visit (INDEPENDENT_AMBULATORY_CARE_PROVIDER_SITE_OTHER): Payer: Medicare Other | Admitting: Licensed Clinical Social Worker

## 2020-07-15 ENCOUNTER — Other Ambulatory Visit: Payer: Self-pay | Admitting: Family Medicine

## 2020-07-15 DIAGNOSIS — F321 Major depressive disorder, single episode, moderate: Secondary | ICD-10-CM | POA: Diagnosis not present

## 2020-07-15 DIAGNOSIS — F411 Generalized anxiety disorder: Secondary | ICD-10-CM | POA: Diagnosis not present
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
## 2020-08-19 ENCOUNTER — Ambulatory Visit (INDEPENDENT_AMBULATORY_CARE_PROVIDER_SITE_OTHER): Payer: Medicare Other | Admitting: Licensed Clinical Social Worker

## 2020-08-19 ENCOUNTER — Other Ambulatory Visit: Payer: Self-pay

## 2020-08-19 DIAGNOSIS — F411 Generalized anxiety disorder: Secondary | ICD-10-CM | POA: Diagnosis not present

## 2020-08-19 DIAGNOSIS — F321 Major depressive disorder, single episode, moderate: Secondary | ICD-10-CM | POA: Diagnosis not present

## 2020-08-19 NOTE — Progress Notes (Signed)
Virtual Visit via Telephone Note  I connected with Kelli Gutierrez on 08/19/20 at  9:00 AM EDT by telephone and verified that I am speaking with the correct person using two identifiers.  Location: Patient: home Provider: home office   I discussed the limitations, risks, security and privacy concerns of performing an evaluation and management service by telephone and the availability of in person appointments. I also discussed with the patient that there may be a patient responsible charge related to this service. The patient expressed understanding and agreed to proceed.  I discussed the assessment and treatment plan with the patient. The patient was provided an opportunity to ask questions and all were answered. The patient agreed with the plan and demonstrated an understanding of the instructions.   The patient was advised to call back or seek an in-person evaluation if the symptoms worsen or if the condition fails to improve as anticipated.  I provided 53 minutes of non-face-to-face time during this encounter.  THERAPIST PROGRESS NOTE  Session Time: 9:00 AM to 9:53 AM  Participation Level: Active  Behavioral Response: CasualAlertDysphoric  Type of Therapy: Individual Therapy  Treatment Goals addressed:  Decrease depression, work through issues of the past, coping Interventions: Solution Focused, Strength-based, Supportive, Reframing and Other: coping  Summary: Kelli Gutierrez is a 70 y.o. female who therapist reviewed questionnaire is needed complete meant and assesses helpful for patient to share about medical issues she is dealing with a part of her main stressors.  Completed nutrition assessment  takes Metformin and feels nauseous and other times eat a lot. Weight doesn't stay where supposed to either losing or gain weight. Gain weight very quickly. Weigh 123 lbs after stroke lost weight quickly and now 153 lbs. After stoke can't do anything with right hand on right side, can't do  anything on right side. Before walking a lot and not walking very far without getting out of breath. Tires easily, feels heavy on the right side. Can't do very much so frustrated. Doesn't eat sweets, or salty has diabetes so following some of the recommendation. Pain all her life two shoulders, needed surgery but scared about surgery so haven't had it.  After reviewing assessments therapist encourage patient to think about some positive things going on in her life. Patient can't identify because of health issues. Might get better but things get worse for example kidney.  After initially not identifying anything patient able to relate lives for grandkids live and her handicap son. One grandchild is 56 and little one is 12 years old. The older one she sees 1- 2x a year. The other lives nearly sees almost every week. Her name is Estill Bamberg. Saw her on Mother's Day. They took her out. Had a good Mother's Day. Patient talked about herself and background. Ex-husband two children with him. 42, 41. He told her to get set up and go to work and come back and get older son. Wouldn't give son back without lawyer didn't know what to do, in a foreign country. Ex-husband cheated on her. When came to this country she first went to [place] husband in [place]. Ex-husband from [place] so went to live there. Married him for 10 years. Patient had enough he was cheating and every time they fought. Worked because wanted to work, thought it was good for her. Still cares for her ex-husband though he married the person he cheated on. 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 Took her younger daughter who was 4-5  years old. Was going to take son and he said get a job and can get back and get him. He said won't give her son back. Didn't know how to fight with him. Son didn't want to stay with dad. Didn't think kids should separate. Didn't pay any money for daughter. Didn't see her son and this one of the things that hurts her the most. the  son hurts her the most.  [place] context to her life she has been here in the [place] since 1972.  Came here and missed her mother would cry every day missed her country. Kids don't know about her own country. Wants to go [place] didn't go when mom was sick and died quickly after. Patient had a stroke so couldn't go. Mom didn't suffer very long.      Therapist reviewed symptoms, facilitated expression of thoughts and feelings, utilized this as significant intervention to help patient cope with current stressors and working through past issues.  Validated patient on the struggles she has faced being an immigrant to a new country and not having support from her ex-husband or family for her back in time when.  At the same time emphasized patient strengths and being able to come to a new country, to be able to work, Immunologist a new language, to be able to take care for herself and live independently.  Noted patient's strengths resources and resiliency to be able to do this and encouraged her to continue to use resources and strengths in managing her stressors.  Noted this is something for her family to be proud of and look up to as part of family history and also encouraged her to share with her family about her history.  Important for them as well as patient to know where they come from, helping them learn more about their identity helping with self-esteem to be proud of their history.  Utilize reframing as well to encourage patient to focus on positive as helpful for mood and this includes grandkids.  Assess need to continue to process through past history to help patient work through her guilt feelings related to significant events in her past history, help her with insight to limited control over the events so she will be able to forgive herself, help her to release guilt feelings.  Therapist provided active listening, open questions, supportive interventions Suicidal/Homicidal: No  Plan: Return again in 5  weeks.2.  Please continue to work with patient on guilt feelings from the past as well as using reframing, strength-based to help her improvement in mood  Diagnosis: Axis I:  major depressive disorder, recurrent, moderate, generalized anxiety disorder    Axis II: No diagnosis    Cordella Register, LCSW 08/19/2020

## 2020-08-25 LAB — HM DIABETES EYE EXAM

## 2020-09-01 ENCOUNTER — Other Ambulatory Visit: Payer: Self-pay | Admitting: Family Medicine

## 2020-09-01 DIAGNOSIS — E785 Hyperlipidemia, unspecified: Secondary | ICD-10-CM

## 2020-09-01 NOTE — Telephone Encounter (Signed)
Requested medication (s) are due for refill today - yes  Requested medication (s) are on the active medication list -yes  Future visit scheduled -yes  Last refill: 05/23/20  Notes to clinic: Rx 1 year ago- will need new Rx- DAW  Requested Prescriptions  Pending Prescriptions Disp Refills   atorvastatin (LIPITOR) 20 MG tablet [Pharmacy Med Name: Atorvastatin Calcium 20 MG Oral Tablet] 90 tablet 0    Sig: Take 1 tablet by mouth once daily      Cardiovascular:  Antilipid - Statins Failed - 09/01/2020  1:24 PM      Failed - Total Cholesterol in normal range and within 360 days    Cholesterol, Total  Date Value Ref Range Status  03/13/2020 90 (L) 100 - 199 mg/dL Final          Passed - LDL in normal range and within 360 days    LDL Chol Calc (NIH)  Date Value Ref Range Status  03/13/2020 29 0 - 99 mg/dL Final          Passed - HDL in normal range and within 360 days    HDL  Date Value Ref Range Status  03/13/2020 47 >39 mg/dL Final          Passed - Triglycerides in normal range and within 360 days    Triglycerides  Date Value Ref Range Status  03/13/2020 62 0 - 149 mg/dL Final          Passed - Patient is not pregnant      Passed - Valid encounter within last 12 months    Recent Outpatient Visits           2 months ago Type 2 diabetes mellitus with stage 3b chronic kidney disease, without long-term current use of insulin ([place])   [facility] [place], Dionne Bucy, MD   5 months ago Need for immunization against influenza   [facility] [place], Dionne Bucy, MD   5 months ago Type 2 diabetes mellitus with hyperglycemia, without long-term current use of insulin [facility])   [facility], Dionne Bu[facility], MD   9 months ago Type 2 diabetes mellitus with hyperglycemia, without long-term current use of insulin ([place])   [facility], Dionne Bucy, MD   1 year ago Essential hypertension with goal blood pressure  less than 130/80   [facility], Dionne Bucy, MD       Future Appointments             In 1 month Bacigalupo, Dionne Bucy, MD [facility]                 Requested Prescriptions  Pending Prescriptions Disp Refills   atorvastatin (LIPITOR) 20 MG tablet [Pharmacy Med Name: Atorvastatin Calcium 20 MG Oral Tablet] 90 tablet 0    Sig: Take 1 tablet by mouth once daily      Cardiovascular:  Antilipid - Statins Failed - 09/01/2020  1:24 PM      Failed - Total Cholesterol in normal range and within 360 days    Cholesterol, Total  Date Value Ref Range Status  03/13/2020 90 (L) 100 - 199 mg/dL Final          Passed - LDL in normal range and within 360 days    LDL Chol Calc (NIH)  Date Value Ref Range Status  03/13/2020 29 0 - 99 mg/dL Final  Passed - HDL in normal range and within 360 days    HDL  Date Value Ref Range Status  03/13/2020 47 >39 mg/dL Final          Passed - Triglycerides in normal range and within 360 days    Triglycerides  Date Value Ref Range Status  03/13/2020 62 0 - 149 mg/dL Final          Passed - Patient is not pregnant      Passed - Valid encounter within last 12 months    Recent Outpatient Visits           2 months ago Type 2 diabetes mellitus with stage 3b chronic kidney disease, without long-term current use of insulin ([place])   [facility] [place], Dionne Bucy, MD   5 months ago Need for immunization against influenza   [facility] [place], Dionne Bucy, MD   5 months ago Type 2 diabetes mellitus with hyperglycemia, without long-term current use of insulin [facility])   [facility], Dionne Bucy, MD   9 months ago Type 2 diabetes mellitus with hyperglycemia, without long-term current use of insulin [facility])   [facility], Dionne Bucy, MD   1 year ago Essential hypertension with goal blood pressure less than 130/80    [facility], Dionne Bucy, MD       Future Appointments             In 1 month Bacigalupo, Dionne Bucy, MD [facility], [facility]

## 2020-09-02 ENCOUNTER — Encounter: Payer: Self-pay | Admitting: Family Medicine

## 2020-09-18 ENCOUNTER — Other Ambulatory Visit: Payer: Self-pay | Admitting: Family Medicine

## 2020-09-18 DIAGNOSIS — Z8673 Personal history of transient ischemic attack (TIA), and cerebral infarction without residual deficits: Secondary | ICD-10-CM

## 2020-09-22 ENCOUNTER — Other Ambulatory Visit: Payer: Self-pay | Admitting: Family Medicine

## 2020-09-23 ENCOUNTER — Ambulatory Visit (INDEPENDENT_AMBULATORY_CARE_PROVIDER_SITE_OTHER): Payer: Medicare Other | Admitting: Licensed Clinical Social Worker

## 2020-09-23 DIAGNOSIS — F411 Generalized anxiety disorder: Secondary | ICD-10-CM

## 2020-09-23 DIAGNOSIS — F321 Major depressive disorder, single episode, moderate: Secondary | ICD-10-CM
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
## 2020-09-29 ENCOUNTER — Other Ambulatory Visit: Payer: Self-pay | Admitting: Family Medicine

## 2020-09-29 NOTE — Telephone Encounter (Signed)
Future visit in 1 week  

## 2020-10-05 ENCOUNTER — Other Ambulatory Visit: Payer: Self-pay | Admitting: Family Medicine

## 2020-10-05 NOTE — Telephone Encounter (Signed)
Requested medication (s) are due for refill today: yes  Requested medication (s) are on the active medication list: yes  Last refill:  07/15/20  Future visit scheduled: yes  Notes to clinic:  Cr high in December 2021   Requested Prescriptions  Pending Prescriptions Disp Refills   JARDIANCE 25 MG TABS tablet [Pharmacy Med Name: Jardiance 25 MG Oral Tablet] 90 tablet 0    Sig: TAKE 1 TABLET BY [place]      Endocrinology:  Diabetes - SGLT2 Inhibitors Failed - 10/05/2020  8:56 AM      Failed - Cr in normal range and within 360 days    Creatinine  Date Value Ref Range Status  10/10/2012 0.93 0.60 - 1.30 mg/dL Final   Creatinine, Ser  Date Value Ref Range Status  03/13/2020 2.22 (H) 0.57 - 1.00 mg/dL Final          Failed - eGFR in normal range and within 360 days    EGFR (African American)  Date Value Ref Range Status  10/10/2012 >60  Final   GFR calc Af Amer  Date Value Ref Range Status  03/13/2020 25 (L) >59 mL/min/1.73 Final    Comment:    **In accordance with recommendations from the NKF-ASN Task force,**   [facility] is in the process of updating its eGFR calculation to the   2021 CKD-EPI creatinine equation that estimates kidney function   without a race variable.    EGFR (Non-African Amer.)  Date Value Ref Range Status  10/10/2012 >60  Final    Comment:    eGFR values <25m/min/1.73 m2 may be an indication of chronic kidney disease (CKD). Calculated eGFR is useful in patients with stable renal function. The eGFR calculation will not be reliable in acutely ill patients when serum creatinine is changing rapidly. It is not useful in  patients on dialysis. The eGFR calculation may not be applicable to patients at the low and high extremes of body sizes, pregnant women, and vegetarians.    GFR calc non Af Amer  Date Value Ref Range Status  03/13/2020 22 (L) >59 mL/min/1.73 Final          Passed - LDL in normal range and within 360 days     LDL Chol Calc (NIH)  Date Value Ref Range Status  03/13/2020 29 0 - 99 mg/dL Final          Passed - HBA1C is between 0 and 7.9 and within 180 days    Hemoglobin A1C  Date Value Ref Range Status  06/24/2020 7.2 (A) 4.0 - 5.6 % Final   Hgb A1c MFr Bld  Date Value Ref Range Status  05/07/2019 8.1 (H) 4.8 - 5.6 % Final    Comment:             Prediabetes: 5.7 - 6.4          Diabetes: >6.4          Glycemic control for adults with diabetes: <7.0           Passed - Valid encounter within last 6 months    Recent Outpatient Visits           3 months ago Type 2 diabetes mellitus with stage 3b chronic kidney disease, without long-term current use of insulin ([facility]   6 months ago Need for immunization against influenza   [facility]  6 months ago Type 2 diabetes mellitus with hyperglycemia, without long-term current use of insulin [facility])   [facility] [place], Dionne Bucy, MD   10 months ago Type 2 diabetes mellitus with hyperglycemia, without long-term current use of insulin [facility])   [facility] [place], Dionne Bucy, MD   1 year ago Essential hypertension with goal blood pressure less than 130/80   [facility], Dionne Bucy, MD       Future Appointments             In 4 days Bacigal[place], Dionne Bucy, MD [facility], [place]

## 2020-10-06 ENCOUNTER — Other Ambulatory Visit: Payer: Self-pay | Admitting: Family Medicine

## 2020-10-06 DIAGNOSIS — Z1231 Encounter for screening mammogram for malignant neoplasm of breast: Secondary | ICD-10-CM

## 2020-10-09 ENCOUNTER — Encounter: Payer: Self-pay | Admitting: Family Medicine

## 2020-10-09 ENCOUNTER — Other Ambulatory Visit: Payer: Self-pay | Admitting: Family Medicine

## 2020-10-09 ENCOUNTER — Ambulatory Visit (INDEPENDENT_AMBULATORY_CARE_PROVIDER_SITE_OTHER): Payer: Medicare Other | Admitting: Family Medicine

## 2020-10-09 ENCOUNTER — Other Ambulatory Visit: Payer: Self-pay

## 2020-10-09 VITALS — BP 138/54 | HR 80 | Temp 98.2°F | Resp 16 | Ht 62.0 in | Wt 156.0 lb

## 2020-10-09 DIAGNOSIS — E1169 Type 2 diabetes mellitus with other specified complication: Secondary | ICD-10-CM

## 2020-10-09 DIAGNOSIS — I69359 Hemiplegia and hemiparesis following cerebral infarction affecting unspecified side: Secondary | ICD-10-CM

## 2020-10-09 DIAGNOSIS — N184 Chronic kidney disease, stage 4 (severe): Secondary | ICD-10-CM

## 2020-10-09 DIAGNOSIS — E1165 Type 2 diabetes mellitus with hyperglycemia: Secondary | ICD-10-CM

## 2020-10-09 DIAGNOSIS — E785 Hyperlipidemia, unspecified: Secondary | ICD-10-CM

## 2020-10-09 DIAGNOSIS — E663 Overweight: Secondary | ICD-10-CM | POA: Diagnosis not present

## 2020-10-09 DIAGNOSIS — M792 Neuralgia and neuritis, unspecified: Secondary | ICD-10-CM

## 2020-10-09 DIAGNOSIS — I152 Hypertension secondary to endocrine disorders: Secondary | ICD-10-CM

## 2020-10-09 DIAGNOSIS — E1142 Type 2 diabetes mellitus with diabetic polyneuropathy: Secondary | ICD-10-CM

## 2020-10-09 DIAGNOSIS — D631 Anemia in chronic kidney disease: Secondary | ICD-10-CM

## 2020-10-09 DIAGNOSIS — Z0000 Encounter for general adult medical examination without abnormal findings: Secondary | ICD-10-CM

## 2020-10-09 DIAGNOSIS — E1159 Type 2 diabetes mellitus with other circulatory complications: Secondary | ICD-10-CM

## 2020-10-09 DIAGNOSIS — E559 Vitamin D deficiency, unspecified: Secondary | ICD-10-CM

## 2020-10-09 DIAGNOSIS — M069 Rheumatoid arthritis, unspecified: Secondary | ICD-10-CM

## 2020-10-09 DIAGNOSIS — Z7901 Long term (current) use of anticoagulants: Secondary | ICD-10-CM | POA: Diagnosis not present

## 2020-10-09 DIAGNOSIS — I69328 Other speech and language deficits following cerebral infarction: Secondary | ICD-10-CM

## 2020-10-09 DIAGNOSIS — F321 Major depressive disorder, single episode, moderate: Secondary | ICD-10-CM

## 2020-10-09 MED ORDER — PREGABALIN 25 MG PO CAPS: 25.0000 mg | ORAL_CAPSULE | Freq: Two times a day (BID) | ORAL | 5 refills | Status: DC

## 2020-10-09 MED ORDER — GLIPIZIDE 5 MG PO TABS: ORAL_TABLET | ORAL | 1 refills | Status: DC

## 2020-10-09 MED ORDER — LISINOPRIL 20 MG PO TABS: 20.0000 mg | ORAL_TABLET | Freq: Every day | ORAL | 1 refills | Status: DC

## 2020-10-09 NOTE — Patient Instructions (Addendum)
The CDC recommends two doses of Shingrix (the shingles vaccine) separated by 2 to 6 months for adults age 70 years and older. I recommend checking with your insurance plan regarding coverage for this vaccine.    Preventive Care 65 Years and Older, Female Preventive care refers to lifestyle choices and visits with your health care provider that can promote health and wellness. This includes: A yearly physical exam. This is also called an annual wellness visit. Regular dental and eye exams. Immunizations. Screening for certain conditions. Healthy lifestyle choices, such as: Eating a healthy diet. Getting regular exercise. Not using drugs or products that contain nicotine and tobacco. Limiting alcohol use. What can I expect for my preventive care visit? Physical exam Your health care provider will check your: Height and weight. These may be used to calculate your BMI (body mass index). BMI is a measurement that tells if you are at a healthy weight. Heart rate and blood pressure. Body temperature. Skin for abnormal spots. Counseling Your health care provider may ask you questions about your: Past medical problems. Family's medical history. Alcohol, tobacco, and drug use. Emotional well-being. Home life and relationship well-being. Sexual activity. Diet, exercise, and sleep habits. History of falls. Memory and ability to understand (cognition). Work and work environment. Pregnancy and menstrual history. Access to firearms. What immunizations do I need?  Vaccines are usually given at various ages, according to a schedule. Your health care provider will recommend vaccines for you based on your age, medicalhistory, and lifestyle or other factors, such as travel or where you work. What tests do I need? Blood tests Lipid and cholesterol levels. These may be checked every 5 years, or more often depending on your overall health. Hepatitis C test. Hepatitis B test. Screening Lung cancer  screening. You may have this screening every year starting at age 55 if you have a 30-pack-year history of smoking and currently smoke or have quit within the past 15 years. Colorectal cancer screening. All adults should have this screening starting at age 70 and continuing until age 75. Your health care provider may recommend screening at age 45 if you are at increased risk. You will have tests every 1-10 years, depending on your results and the type of screening test. Diabetes screening. This is done by checking your blood sugar (glucose) after you have not eaten for a while (fasting). You may have this done every 1-3 years. Mammogram. This may be done every 1-2 years. Talk with your health care provider about how often you should have regular mammograms. Abdominal aortic aneurysm (AAA) screening. You may need this if you are a current or former smoker. BRCA-related cancer screening. This may be done if you have a family history of breast, ovarian, tubal, or peritoneal cancers. Other tests STD (sexually transmitted disease) testing, if you are at risk. Bone density scan. This is done to screen for osteoporosis. You may have this done starting at age 65. Talk with your health care provider about your test results, treatment options,and if necessary, the need for more tests. Follow these instructions at home: Eating and drinking  Eat a diet that includes fresh fruits and vegetables, whole grains, lean protein, and low-fat dairy products. Limit your intake of foods with high amounts of sugar, saturated fats, and salt. Take vitamin and mineral supplements as recommended by your health care provider. Do not drink alcohol if your health care provider tells you not to drink. If you drink alcohol: Limit how much you have to 0-1   drink a day. Be aware of how much alcohol is in your drink. In the [place] one drink equals one 12 oz bottle of beer (355 mL), one 5 oz glass of wine (148 mL), or one 1 oz  glass of hard liquor (44 mL).  Lifestyle Take daily care of your teeth and gums. Brush your teeth every morning and night with fluoride toothpaste. Floss one time each day. Stay active. Exercise for at least 30 minutes 5 or more days each week. Do not use any products that contain nicotine or tobacco, such as cigarettes, e-cigarettes, and chewing tobacco. If you need help quitting, ask your health care provider. Do not use drugs. If you are sexually active, practice safe sex. Use a condom or other form of protection in order to prevent STIs (sexually transmitted infections). Talk with your health care provider about taking a low-dose aspirin or statin. Find healthy ways to cope with stress, such as: Meditation, yoga, or listening to music. Journaling. Talking to a trusted person. Spending time with friends and family. Safety Always wear your seat belt while driving or riding in a vehicle. Do not drive: If you have been drinking alcohol. Do not ride with someone who has been drinking. When you are tired or distracted. While texting. Wear a helmet and other protective equipment during sports activities. If you have firearms in your house, make sure you follow all gun safety procedures. What's next? Visit your health care provider once a year for an annual wellness visit. Ask your health care provider how often you should have your eyes and teeth checked. Stay up to date on all vaccines. This information is not intended to replace advice given to you by your health care provider. Make sure you discuss any questions you have with your healthcare provider. Document Revised: 03/19/2020 Document Reviewed: 03/23/2018 Elsevier Patient Education  2022 [employer]  

## 2020-10-09 NOTE — Progress Notes (Signed)
Annual Wellness Visit     Patient: Kelli Gutierrez, Female    DOB: [birth year], 70 y.o.   MRN: [id number] Visit Date: 10/09/2020  Today's Provider: Lavon Paganini, MD   Chief Complaint  Patient presents with   Annual Exam   Subjective    Kelli Gutierrez is a 70 y.o. female who presents today for her Annual Wellness Visit. She reports consuming a general diet. The patient does not participate in regular exercise at present. She generally feels well. She reports sleeping fairly well. She does not have additional problems to discuss today.   Neuropathy She has numbness and tingling in her hands. It is more prominent at night. Also she is experiencing these symptoms in her feet. She was taking gabapentin however she wasn't feeling well.   Diabetes  She is currently taking metformin 1x a day. However she had an increase in appetite. During the night she wakes up hungry. She occasionally checks her blood sugar after a meal. She recently visited nephrology and performed labs and reduced her intake of metformin.  Mood Her mood has improved but she still feels sad sometimes.   Vaccines  She has received 3 COVID vaccines. She has not gotten the shingles vaccine and she believes she has shingles spot on her back that is painful for her.      Medications: Outpatient Medications Prior to Visit  Medication Sig   aspirin 81 MG chewable tablet Chew 81 mg by mouth daily.   atorvastatin (LIPITOR) 20 MG tablet Take 1 tablet (20 mg total) by mouth daily.   clopidogrel (PLAVIX) 75 MG tablet Take 1 tablet by mouth once daily   glucose blood (CONTOUR NEXT TEST) test strip Use to test blood glucose as directed 1-4 times daily   hydroxychloroquine (PLAQUENIL) 200 MG tablet Take 200 mg by mouth daily.   JANUVIA 50 MG tablet Take 1 tablet by mouth once daily   JARDIANCE 25 MG TABS tablet TAKE 1 TABLET BY MOUTH ONCE DAILY BEFORE BREAKFAST   Lancet Devices MISC Use to test blood glucose as directed 1-4  times daily   metFORMIN (GLUCOPHAGE) 500 MG tablet Take 500 mg by mouth at bedtime.   metoprolol succinate (TOPROL-XL) 25 MG 24 hr tablet Take 1 tablet by mouth once daily   patiromer (VELTASSA) 8.4 g packet Take 8.4 g by mouth daily.   sevelamer carbonate (RENVELA) 800 MG tablet Take 800 mg by mouth 3 (three) times daily with meals.    [DISCONTINUED] gabapentin (NEURONTIN) 100 MG capsule Take 1 capsule (100 mg total) by mouth 3 (three) times daily.   [DISCONTINUED] glipiZIDE (GLUCOTROL) 5 MG tablet TAKE 1/2 (ONE-HALF) TABLET BY MOUTH ONCE DAILY BEFORE BREAKFAST   [DISCONTINUED] lisinopril (ZESTRIL) 40 MG tablet Take 1 tablet by mouth once daily   No facility-administered medications prior to visit.    Allergies  Allergen Reactions   Hydrocodone     Other reaction(s): Dizziness    Patient Care Team: Virginia Crews, MD as PCP - General (Family Medicine) Clarice Pole, MD as Referring Physician (Nephrology) Emmaline Kluver., MD (Rheumatology) Yolonda Kida, MD as Consulting Physician (Cardiology) Dingeldein, Remo Lipps, MD (Ophthalmology)  Review of Systems  All other systems reviewed and are negative.      Objective    Vitals: BP (!) 138/54   Pulse 80   Temp 98.2 F (36.8 C)   Resp 16   Ht '5\' 2"'$  (1.575 m)   Wt 156 lb (  70.8 kg)   BMI 28.53 kg/m    Physical Exam Vitals reviewed.  Constitutional:      General: She is not in acute distress.    Appearance: Normal appearance. She is well-developed. She is not diaphoretic.  HENT:     Head: Normocephalic and atraumatic.     Right Ear: Tympanic membrane, ear canal and external ear normal.     Left Ear: Tympanic membrane, ear canal and external ear normal.     Nose: Nose normal.     Mouth/Throat:     Mouth: Mucous membranes are moist.     Pharynx: Oropharynx is clear. No oropharyngeal exudate.  Eyes:     General: No scleral icterus.    Conjunctiva/sclera: Conjunctivae normal.     Pupils: Pupils are equal,  round, and reactive to light.  Neck:     Thyroid: No thyromegaly.  Cardiovascular:     Rate and Rhythm: Normal rate and regular rhythm.     Pulses: Normal pulses.     Heart sounds: Normal heart sounds. No murmur heard. Pulmonary:     Effort: Pulmonary effort is normal. No respiratory distress.     Breath sounds: Normal breath sounds. No wheezing or rales.  Abdominal:     General: There is no distension.     Palpations: Abdomen is soft.     Tenderness: no abdominal tenderness  Musculoskeletal:        General: No deformity.     Cervical back: Neck supple.     Right lower leg: No edema.     Left lower leg: No edema.     Comments: Negative tinels and phalens   Lymphadenopathy:     Cervical: No cervical adenopathy.  Skin:    General: Skin is warm and dry.     Findings: No rash.  Neurological:     Mental Status: She is alert and oriented to person, place, and time. Mental status is at baseline.     Sensory: No sensory deficit.     Motor: No weakness.     Gait: Gait normal.  Psychiatric:        Mood and Affect: Mood normal.        Behavior: Behavior normal.        Thought Content: Thought content normal.   Most recent functional status assessment: In your present state of health, do you have any difficulty performing the following activities: 10/09/2020  Hearing? Y  Vision? Y  Difficulty concentrating or making decisions? Y  Walking or climbing stairs? Y  Dressing or bathing? N  Doing errands, shopping? N  Some recent data might be hidden   Most recent fall risk assessment: Fall Risk  10/09/2020  Falls in the past year? 0  Number falls in past yr: 0  Injury with Fall? 0  Risk for fall due to : No Fall Risks  Follow up Falls evaluation completed    Most recent depression screenings: PHQ 2/9 Scores 10/09/2020 06/24/2020  PHQ - 2 Score 4 3  PHQ- 9 Score 9 11  Some encounter information is confidential and restricted. Go to Review Flowsheets activity to see all data.    Most recent cognitive screening: 6CIT Screen 10/09/2020  What Year? 0 points  What month? 0 points  What time? 0 points  Count back from 20 0 points  Months in reverse 2 points  Repeat phrase 0 points  Total Score 2   Most recent Audit-C alcohol use screening Alcohol Use Disorder Test (AUDIT)  10/09/2020  1. How often do you have a drink containing alcohol? 0  2. How many drinks containing alcohol do you have on a typical day when you are drinking? 0  3. How often do you have six or more drinks on one occasion? 0  AUDIT-C Score 0  Alcohol Brief Interventions/Follow-up -   A score of 3 or more in women, and 4 or more in men indicates increased risk for alcohol abuse, EXCEPT if all of the points are from question 1   Results for orders placed or performed in visit on 10/09/20  CBC w/Diff/Platelet  Result Value Ref Range   WBC 5.4 3.4 - 10.8 x10E3/uL   RBC 3.89 3.77 - 5.28 x10E6/uL   Hemoglobin 11.1 11.1 - 15.9 g/dL   Hematocrit 35.9 34.0 - 46.6 %   MCV 92 79 - 97 fL   MCH 28.5 26.6 - 33.0 pg   MCHC 30.9 (L) 31.5 - 35.7 g/dL   RDW 12.5 11.7 - 15.4 %   Platelets 193 150 - 450 x10E3/uL   Neutrophils 58 Not Estab. %   Lymphs 30 Not Estab. %   Monocytes 8 Not Estab. %   Eos 4 Not Estab. %   Basos 0 Not Estab. %   Neutrophils Absolute 3.1 1.4 - 7.0 x10E3/uL   Lymphocytes Absolute 1.6 0.7 - 3.1 x10E3/uL   Monocytes Absolute 0.4 0.1 - 0.9 x10E3/uL   EOS (ABSOLUTE) 0.2 0.0 - 0.4 x10E3/uL   Basophils Absolute 0.0 0.0 - 0.2 x10E3/uL   Immature Granulocytes 0 Not Estab. %   Immature Grans (Abs) 0.0 0.0 - 0.1 x10E3/uL    Assessment & Plan     Problem List Items Addressed This Visit       Cardiovascular and Mediastinum   Hypertension associated with diabetes ([place])    Well controlled Continue current medications Reviewed recent metabolic panel       Relevant Medications   metFORMIN (GLUCOPHAGE) 500 MG tablet   lisinopril (ZESTRIL) 20 MG tablet   glipiZIDE (GLUCOTROL) 5  MG tablet     Endocrine   Type 2 diabetes mellitus with hyperglycemia, without long-term current use of insulin (HCC)    Improvign with A1c 7 at last Nephro appt Continue current meds (renally dosed) Discussed screenings       Relevant Medications   metFORMIN (GLUCOPHAGE) 500 MG tablet   lisinopril (ZESTRIL) 20 MG tablet   glipiZIDE (GLUCOTROL) 5 MG tablet   Hyperlipidemia associated with type 2 diabetes mellitus (HCC)    Previously well controlled Continue statin Goal LDL < 70       Relevant Medications   metFORMIN (GLUCOPHAGE) 500 MG tablet   lisinopril (ZESTRIL) 20 MG tablet   glipiZIDE (GLUCOTROL) 5 MG tablet   Diabetic peripheral neuropathy (HCC)    Did not tolerate gabapentin Will try low dose Lyrica (renally dosed)       Relevant Medications   metFORMIN (GLUCOPHAGE) 500 MG tablet   lisinopril (ZESTRIL) 20 MG tablet   glipiZIDE (GLUCOTROL) 5 MG tablet   pregabalin (LYRICA) 25 MG capsule     Nervous and Auditory   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)    Chronic and stable R weakness, rigidity         Musculoskeletal and Integument   Rheumatoid arthritis (Chronic)    Chronic and stable F/b Rheum Continue Plaquenil Encourage regular eye exams         Genitourinary   CKD (chronic  kidney disease) stage 4, GFR 15-29 ml/min (HCC)    F/b Renal Continue to monitor Renally dose meds       Anemia in stage 4 chronic kidney disease (HCC)    Continue to monitor CBC       Relevant Orders   CBC w/Diff/Platelet (Completed)     Other   Current use of long term anticoagulation (Plavix) (Chronic)   Relevant Orders   CBC w/Diff/Platelet (Completed)   Neuropathic pain (Chronic)    See above - start lyrica       Vitamin D deficiency   Current moderate episode of major depressive disorder without prior episode ([place])    Stable Declines medications Encourage therapy       Overweight    Discussed importance of  healthy weight management Discussed diet and exercise        Other Visit Diagnoses     Encounter for annual wellness visit (AWV) in Medicare patient    -  Primary      Annual wellness visit done today including the all of the following: Reviewed patient's Family Medical History Reviewed and updated list of patient's medical providers Assessment of cognitive impairment was done Assessed patient's functional ability Established a written schedule for health screening services Health Risk Assessent Completed and Reviewed  Exercise Activities and Dietary recommendations  Goals       DIET - INCREASE WATER INTAKE      Recommend to drink at least 6-8 8oz glasses of water per day.       I want to know my medications (pt-stated)      Current Barriers:  Polypharmacy; complex patient with multiple comorbidities including DM, HTN Self-manages medications, Does not use a pill box or other adherence strategies   Pharmacist Clinical Goal(s):  Over the next 30 days, patient will work with PharmD and provider towards optimized medication management  Interventions: Comprehensive medication review performed; medication list updated in electronic medical record Requested fill history from several pharmacies patient has used in the past 1 year in order to clarify adherence Developed a medication chart that patient can refer to at doctor office visits and to help adherence  Patient Self Care Activities:  Patient will take medications as prescribed Patient will focus on improved adherence by utilizing medication adherence chart   Initial goal documentation          Immunization History  Administered Date(s) Administered   Fluad Quad(high Dose 65+) 03/24/2020   PFIZER(Purple Top)SARS-COV-2 Vaccination 07/06/2019, 08/01/2019, 03/10/2020   Pneumococcal Conjugate-13 02/13/2018   Pneumococcal Polysaccharide-23 09/16/2015   Td 10/21/1993, 11/24/1993, 01/04/1995   Health Maintenance   Topic Date Due   Zoster Vaccines- Shingrix (1 of 2) Never done   TETANUS/TDAP  01/03/2005   COVID-19 Vaccine (4 - Booster for Pfizer series) 06/09/2020   INFLUENZA VACCINE  11/10/2020   HEMOGLOBIN A1C  12/25/2020   FOOT EXAM  06/24/2021   OPHTHALMOLOGY EXAM  08/25/2021   MAMMOGRAM  08/30/2021   Fecal DNA (Cologuard)  02/10/2023   DEXA SCAN  Completed   Hepatitis C Screening  Completed   PNA vac Low Risk Adult  Completed   HPV VACCINES  Aged Out   Discussed health benefits of physical activity, and encouraged her to engage in regular exercise appropriate for her age and condition.    Return in about 6 months (around 04/10/2021) for chronic disease f/u.     I,Essence Turner,acting as a Education administrator for Lavon Paganini, MD.,have documented all relevant documentation on  the behalf of Lavon Paganini, MD,as directed by  Lavon Paganini, MD while in the presence of Lavon Paganini, MD.  I, Lavon Paganini, MD, have reviewed all documentation for this visit. The documentation on 10/10/20 for the exam, diagnosis, procedures, and orders are all accurate and complete.   Laporshia Hogen, Dionne Bucy, MD, MPH [facility]

## 2020-10-09 NOTE — Telephone Encounter (Signed)
   Notes to clinic:  Patient has appt today    Requested Prescriptions  Pending Prescriptions Disp Refills   JANUVIA 50 MG tablet [Pharmacy Med Name: Januvia 50 MG Oral Tablet] 90 tablet 0    Sig: Take 1 tablet by mouth once daily      Endocrinology:  Diabetes - DPP-4 Inhibitors Failed - 10/09/2020  7:51 AM      Failed - Cr in normal range and within 360 days    Creatinine  Date Value Ref Range Status  10/10/2012 0.93 0.60 - 1.30 mg/dL Final   Creatinine, Ser  Date Value Ref Range Status  03/13/2020 2.22 (H) 0.57 - 1.00 mg/dL Final          Passed - HBA1C is between 0 and 7.9 and within 180 days    Hemoglobin A1C  Date Value Ref Range Status  06/24/2020 7.2 (A) 4.0 - 5.6 % Final   Hgb A1c MFr Bld  Date Value Ref Range Status  05/07/2019 8.1 (H) 4.8 - 5.6 % Final    Comment:             Prediabetes: 5.7 - 6.4          Diabetes: >6.4          Glycemic control for adults with diabetes: <7.0           Passed - Valid encounter within last 6 months    Recent Outpatient Visits           3 months ago Type 2 diabetes mellitus with stage 3b chronic kidney disease, without long-term current use of insulin ([place])   [facility] [place], Dionne Bucy, MD   6 months ago Need for immunization against influenza   [facility] [place], Dionne Bucy, MD   7 months ago Type 2 diabetes mellitus with hyperglycemia, without long-term current use of insulin [facility])   [facility], Dionne Bucy, MD   10 months ago Type 2 diabetes mellitus with hyperglycemia, without long-term current use of insulin [facility])   [place], Dionne Bucy, MD   1 year ago Essential hypertension with goal blood pressure less than 130/80   [facility], Dionne Bucy, MD       Future Appointments             Today Bacigalupo, Dionne Bucy, MD [facility]

## 2020-10-10 LAB — CBC WITH DIFFERENTIAL/PLATELET
Basophils Absolute: 0 10*3/uL (ref 0.0–0.2)
Basos: 0 %
EOS (ABSOLUTE): 0.2 10*3/uL (ref 0.0–0.4)
Eos: 4 %
Hematocrit: 35.9 % (ref 34.0–46.6)
Hemoglobin: 11.1 g/dL (ref 11.1–15.9)
Immature Grans (Abs): 0 10*3/uL (ref 0.0–0.1)
Immature Granulocytes: 0 %
Lymphocytes Absolute: 1.6 10*3/uL (ref 0.7–3.1)
Lymphs: 30 %
MCH: 28.5 pg (ref 26.6–33.0)
MCHC: 30.9 g/dL — ABNORMAL LOW (ref 31.5–35.7)
MCV: 92 fL (ref 79–97)
Monocytes Absolute: 0.4 10*3/uL (ref 0.1–0.9)
Monocytes: 8 %
Neutrophils Absolute: 3.1 10*3/uL (ref 1.4–7.0)
Neutrophils: 58 %
Platelets: 193 10*3/uL (ref 150–450)
RBC: 3.89 x10E6/uL (ref 3.77–5.28)
RDW: 12.5 % (ref 11.7–15.4)
WBC: 5.4 10*3/uL (ref 3.4–10.8)

## 2020-10-10 NOTE — Assessment & Plan Note (Signed)
Chronic and stable R weakness, rigidity

## 2020-10-10 NOTE — Assessment & Plan Note (Signed)
Stable Declines medications Encourage therapy

## 2020-10-10 NOTE — Assessment & Plan Note (Signed)
Well controlled Continue current medications Reviewed recent metabolic panel 

## 2020-10-10 NOTE — Assessment & Plan Note (Signed)
Continue to monitor CBC. 

## 2020-10-10 NOTE — Assessment & Plan Note (Signed)
See above - start lyrica

## 2020-10-10 NOTE — Assessment & Plan Note (Signed)
Previously well controlled Continue statin Goal LDL < 70

## 2020-10-10 NOTE — Assessment & Plan Note (Signed)
Did not tolerate gabapentin Will try low dose Lyrica (renally dosed)

## 2020-10-10 NOTE — Assessment & Plan Note (Signed)
Discussed importance of healthy weight management Discussed diet and exercise  

## 2020-10-10 NOTE — Assessment & Plan Note (Signed)
Chronic and stable F/b Rheum Continue Plaquenil Encourage regular eye exams

## 2020-10-10 NOTE — Assessment & Plan Note (Signed)
F/b Renal Continue to monitor Renally dose meds

## 2020-10-10 NOTE — Assessment & Plan Note (Signed)
Improvign with A1c 7 at last Nephro appt Continue current meds (renally dosed) Discussed screenings

## 2020-10-16 ENCOUNTER — Other Ambulatory Visit: Payer: Self-pay

## 2020-10-16 ENCOUNTER — Ambulatory Visit
Admission: RE | Admit: 2020-10-16 | Discharge: 2020-10-16 | Disposition: A | Discharge status: Home / Self Care | Payer: Medicare Other | Source: Ambulatory Visit | Attending: Family Medicine | Admitting: Family Medicine

## 2020-10-16 DIAGNOSIS — Z1231 Encounter for screening mammogram for malignant neoplasm of breast: Secondary | ICD-10-CM | POA: Insufficient documentation

## 2020-10-24 ENCOUNTER — Other Ambulatory Visit: Payer: Self-pay | Admitting: Family Medicine

## 2020-10-24 DIAGNOSIS — Z8673 Personal history of transient ischemic attack (TIA), and cerebral infarction without residual deficits: Secondary | ICD-10-CM

## 2020-10-24 NOTE — Telephone Encounter (Signed)
Future visit in 6 months

## 2020-11-05 ENCOUNTER — Ambulatory Visit (HOSPITAL_COMMUNITY): Payer: Medicare Other | Admitting: Licensed Clinical Social Worker

## 2020-11-24 ENCOUNTER — Ambulatory Visit (HOSPITAL_COMMUNITY): Payer: Medicare Other | Admitting: Licensed Clinical Social Worker

## 2020-11-24 NOTE — Progress Notes (Signed)
Therapist attempted to contact patient by phone two times and she did not respond. Session is a no show.

## 2020-11-28 ENCOUNTER — Other Ambulatory Visit: Payer: Self-pay | Admitting: Family Medicine

## 2020-11-28 DIAGNOSIS — Z8673 Personal history of transient ischemic attack (TIA), and cerebral infarction without residual deficits: Secondary | ICD-10-CM

## 2020-11-28 NOTE — Telephone Encounter (Signed)
Requested medication (s) are due for refill today: yes  Requested medication (s) are on the active medication list: yes  Last refill:  10/24/2020  Future visit scheduled:  yes  Notes to clinic: Failed protocol:  Evaluate AST, ALT within 2 months of therapy initiation.   ALT in normal range and within 360 days    Requested Prescriptions  Pending Prescriptions Disp Refills   clopidogrel (PLAVIX) 75 MG tablet [Pharmacy Med Name: Clopidogrel Bisulfate 75 MG Oral Tablet] 30 tablet 0    Sig: TAKE 1 TABLET BY MOUTH ONCE DAILY (COURTESY  SUPPLY  UNTIL  APPOINTMENT)     Hematology: Antiplatelets - clopidogrel Failed - 11/28/2020 12:20 PM      Failed - Evaluate AST, ALT within 2 months of therapy initiation.      Failed - ALT in normal range and within 360 days    ALT  Date Value Ref Range Status  03/13/2020 50 (H) 0 - 32 IU/L Final   SGPT (ALT)  Date Value Ref Range Status  10/10/2012 41 12 - 78 U/L Final          Passed - AST in normal range and within 360 days    AST  Date Value Ref Range Status  03/13/2020 37 0 - 40 IU/L Final   SGOT(AST)  Date Value Ref Range Status  10/10/2012 21 15 - 37 Unit/L Final          Passed - HCT in normal range and within 180 days    Hematocrit  Date Value Ref Range Status  10/09/2020 35.9 34.0 - 46.6 % Final          Passed - HGB in normal range and within 180 days    Hemoglobin  Date Value Ref Range Status  10/09/2020 11.1 11.1 - 15.9 g/dL Final          Passed - PLT in normal range and within 180 days    Platelets  Date Value Ref Range Status  10/09/2020 193 150 - 450 x10E3/uL Final          Passed - Valid encounter within last 6 months    Recent Outpatient Visits           1 month ago Encounter for annual wellness visit (AWV) in Medicare patient   [place], Dionne Bucy, MD   5 months ago Type 2 diabetes mellitus with stage 3b chronic kidney disease, without long-term current use of insulin ([place])    [place], Dionne Bucy, MD   8 months ago Need for immunization against influenza   [facility] [place], Dionne Bucy, MD   8 months ago Type 2 diabetes mellitus with hyperglycemia, without long-term current use of insulin [facility])   [facility], Dionne Bucy, MD   11 months ago Type 2 diabetes mellitus with hyperglycemia, without long-term current use of insulin [facility])   [place], Dionne Bucy, MD       Future Appointments             In 5 months Bacigalupo, Dionne Bucy, MD [facility] [place]

## 2020-12-10 ENCOUNTER — Other Ambulatory Visit: Payer: Self-pay | Admitting: Family Medicine

## 2020-12-10 DIAGNOSIS — E1165 Type 2 diabetes mellitus with hyperglycemia: Secondary | ICD-10-CM

## 2020-12-17 ENCOUNTER — Ambulatory Visit (INDEPENDENT_AMBULATORY_CARE_PROVIDER_SITE_OTHER): Payer: Medicare Other | Admitting: Licensed Clinical Social Worker

## 2020-12-17 ENCOUNTER — Encounter (HOSPITAL_COMMUNITY): Payer: Self-pay

## 2020-12-17 DIAGNOSIS — F411 Generalized anxiety disorder: Secondary | ICD-10-CM

## 2020-12-17 DIAGNOSIS — F321 Major depressive disorder, single episode, moderate: Secondary | ICD-10-CM
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
## 2020-12-17 NOTE — Plan of Care (Signed)
Patient is progressing well

## 2020-12-26 LAB — HEMOGLOBIN A1C: Hemoglobin A1C: 7.7

## 2020-12-30 ENCOUNTER — Other Ambulatory Visit: Payer: Self-pay | Admitting: Family Medicine

## 2021-01-12 ENCOUNTER — Other Ambulatory Visit: Payer: Self-pay | Admitting: Family Medicine

## 2021-02-16 ENCOUNTER — Encounter: Payer: Self-pay | Admitting: Family Medicine

## 2021-02-16 ENCOUNTER — Ambulatory Visit (INDEPENDENT_AMBULATORY_CARE_PROVIDER_SITE_OTHER): Payer: Medicare Other | Admitting: Family Medicine

## 2021-02-16 ENCOUNTER — Other Ambulatory Visit: Payer: Self-pay

## 2021-02-16 VITALS — BP 120/70 | HR 74 | Temp 97.0°F | Resp 16 | Wt 157.3 lb

## 2021-02-16 DIAGNOSIS — K219 Gastro-esophageal reflux disease without esophagitis: Secondary | ICD-10-CM | POA: Diagnosis not present

## 2021-02-16 DIAGNOSIS — M792 Neuralgia and neuritis, unspecified: Secondary | ICD-10-CM

## 2021-02-16 DIAGNOSIS — D631 Anemia in chronic kidney disease: Secondary | ICD-10-CM

## 2021-02-16 DIAGNOSIS — E785 Hyperlipidemia, unspecified: Secondary | ICD-10-CM

## 2021-02-16 DIAGNOSIS — Z23 Encounter for immunization: Secondary | ICD-10-CM

## 2021-02-16 DIAGNOSIS — I152 Hypertension secondary to endocrine disorders: Secondary | ICD-10-CM

## 2021-02-16 DIAGNOSIS — E663 Overweight: Secondary | ICD-10-CM

## 2021-02-16 DIAGNOSIS — E1142 Type 2 diabetes mellitus with diabetic polyneuropathy: Secondary | ICD-10-CM

## 2021-02-16 DIAGNOSIS — E1165 Type 2 diabetes mellitus with hyperglycemia: Secondary | ICD-10-CM | POA: Diagnosis not present

## 2021-02-16 DIAGNOSIS — N184 Chronic kidney disease, stage 4 (severe): Secondary | ICD-10-CM | POA: Diagnosis not present

## 2021-02-16 DIAGNOSIS — E1159 Type 2 diabetes mellitus with other circulatory complications: Secondary | ICD-10-CM

## 2021-02-16 DIAGNOSIS — E1169 Type 2 diabetes mellitus with other specified complication: Secondary | ICD-10-CM

## 2021-02-16 MED ORDER — SEVELAMER CARBONATE 800 MG PO TABS
800.0000 mg | ORAL_TABLET | Freq: Three times a day (TID) | ORAL | 11 refills | Status: AC
Start: 2021-02-16 — End: 2022-02-16

## 2021-02-16 MED ORDER — VELTASSA 8.4 G PO PACK
8.4000 g | PACK | Freq: Every day | ORAL | 5 refills | Status: DC
Start: 2021-02-16 — End: 2023-01-27

## 2021-02-16 MED ORDER — RYBELSUS 7 MG PO TABS: 7.0000 mg | ORAL_TABLET | Freq: Every day | ORAL | 5 refills | Status: DC

## 2021-02-16 NOTE — Assessment & Plan Note (Signed)
Well controlled. Continue current medications  

## 2021-02-16 NOTE — Assessment & Plan Note (Signed)
Discussed importance of healthy weight management Discussed diet and exercise  

## 2021-02-16 NOTE — Assessment & Plan Note (Signed)
Chronic, stable Not on PPI

## 2021-02-16 NOTE — Assessment & Plan Note (Signed)
Previously well controlled Continue atorvastatin 20mg  Recheck lipid panel and hepatic function panel

## 2021-02-16 NOTE — Progress Notes (Signed)
Established patient visit   Patient: Kelli Gutierrez   DOB: 21-Oct-1950   70 y.o. Female  MRN: [id number] Visit Date: 02/16/2021  Today's healthcare provider: Lavon Paganini, MD   Chief Complaint  Patient presents with   Follow-up   Subjective    Fasting blood glucose 165-195 Sometimes taking metformin twice a day when sugars are high  Not currently taking veltassa, renvela after samples ran out Not currently taking lyrica  She received a cortisone injection at [facility] last week Plaquenil not working well, [facility] will change medication to [employer] intermittent left ear pain. She denies dizziness, nausea, acute hearing loss. TM clear, canal normal.  Medications: Outpatient Medications Prior to Visit  Medication Sig   aspirin 81 MG chewable tablet Chew 81 mg by mouth daily.   atorvastatin (LIPITOR) 20 MG tablet Take 1 tablet (20 mg total) by mouth daily.   clopidogrel (PLAVIX) 75 MG tablet Take 1 tablet (75 mg total) by mouth daily.   glipiZIDE (GLUCOTROL) 5 MG tablet TAKE 1/2 (ONE-HALF) TABLET BY MOUTH ONCE DAILY BEFORE BREAKFAST   glucose blood (CONTOUR NEXT TEST) test strip Use to test blood glucose as directed 1-4 times daily   hydroxychloroquine (PLAQUENIL) 200 MG tablet Take 200 mg by mouth daily.   JARDIANCE 25 MG TABS tablet TAKE 1 TABLET BY MOUTH ONCE DAILY BEFORE BREAKFAST   Lancet Devices MISC Use to test blood glucose as directed 1-4 times daily   lisinopril (ZESTRIL) 20 MG tablet Take 1 tablet (20 mg total) by mouth daily.   metFORMIN (GLUCOPHAGE) 500 MG tablet Take 500 mg by mouth at bedtime.   metoprolol succinate (TOPROL-XL) 25 MG 24 hr tablet Take 1 tablet by mouth once daily   [DISCONTINUED] JANUVIA 50 MG tablet Take 1 tablet by mouth once daily   pregabalin (LYRICA) 25 MG capsule Take 1 capsule (25 mg total) by mouth 2 (two) times daily.   [DISCONTINUED] patiromer (VELTASSA) 8.4 g packet Take 8.4 g by mouth daily.   [DISCONTINUED] sevelamer carbonate  (RENVELA) 800 MG tablet Take 800 mg by mouth 3 (three) times daily with meals.    No facility-administered medications prior to visit.    Review of Systems  Constitutional: Negative.   HENT:  Positive for ear pain and tinnitus.   Eyes: Negative.   Respiratory: Negative.    Cardiovascular: Negative.   Gastrointestinal: Negative.   Genitourinary: Negative.   Musculoskeletal:  Positive for arthralgias and joint swelling.  Skin: Negative.   Neurological:  Negative for dizziness and headaches.      Objective    BP 120/70 (BP Location: Right Arm, Patient Position: Sitting, Cuff Size: Normal)   Pulse 74   Temp (!) 97 F (36.1 C) (Temporal)   Resp 16   Wt 157 lb 4.8 oz (71.4 kg)   SpO2 97%   BMI 28.77 kg/m  {Show previous vital signs (optional):23777}  Physical Exam Vitals reviewed.  Constitutional:      General: She is not in acute distress.    Appearance: Normal appearance. She is not ill-appearing or toxic-appearing.  HENT:     Head: Normocephalic and atraumatic.     Right Ear: External ear normal.     Left Ear: External ear normal.     Nose: Nose normal.     Mouth/Throat:     Mouth: Mucous membranes are moist.     Pharynx: Oropharynx is clear. No oropharyngeal exudate or posterior oropharyngeal erythema.  Eyes:  General: No scleral icterus.    Extraocular Movements: Extraocular movements intact.     Conjunctiva/sclera: Conjunctivae normal.     Pupils: Pupils are equal, round, and reactive to light.  Cardiovascular:     Rate and Rhythm: Normal rate and regular rhythm.     Pulses: Normal pulses.     Heart sounds: Normal heart sounds. No murmur heard.   No friction rub. No gallop.  Pulmonary:     Effort: Pulmonary effort is normal. No respiratory distress.     Breath sounds: Normal breath sounds. No wheezing or rhonchi.  Chest:     Chest wall: No tenderness.  Abdominal:     General: Abdomen is flat. There is no distension.     Palpations: Abdomen is soft.      Tenderness: There is no abdominal tenderness.  Musculoskeletal:        General: Normal range of motion.     Cervical back: Normal range of motion and neck supple.     Right lower leg: No edema.     Left lower leg: No edema.  Skin:    General: Skin is warm and dry.     Capillary Refill: Capillary refill takes less than 2 seconds.     Findings: No lesion or rash.  Neurological:     Mental Status: She is alert and oriented to person, place, and time. Mental status is at baseline.     Sensory: Sensory deficit present.     Motor: Weakness present.     Gait: Gait abnormal.  Psychiatric:        Mood and Affect: Mood normal.      Results for orders placed or performed in visit on 02/16/21  Hemoglobin A1c  Result Value Ref Range   Hemoglobin A1C 7.7     Assessment & Plan     Problem List Items Addressed This Visit       Cardiovascular and Mediastinum   Hypertension associated with diabetes ([place])    Well controlled Continue current medications      Relevant Medications   Semaglutide (RYBELSUS) 7 MG TABS     Digestive   GERD (gastroesophageal reflux disease)   Relevant Medications   sevelamer carbonate (RENVELA) 800 MG tablet     Endocrine   Type 2 diabetes mellitus with hyperglycemia, without long-term current use of insulin (HCC) - Primary    Chronic, uncontrolled Last HgbA1C from nephro on 12/26/2020 was 7.7 Okay to continue metformin 500mg  once daily per nephrology Continue Jardiance 25mg   Continue glipizide 5mg  Start semaglutide 7mg  tablet Discontinue Januvia 50mg         Relevant Medications   Semaglutide (RYBELSUS) 7 MG TABS   Hyperlipidemia associated with type 2 diabetes mellitus ([place])    Previously well controlled Continue atorvastatin 20mg  Recheck lipid panel and hepatic function panel      Relevant Medications   Semaglutide (RYBELSUS) 7 MG TABS   Other Relevant Orders   Lipid panel   Hepatic function panel   Diabetic peripheral neuropathy ([place])     Patient does not notice Lyrica having any effect Discontinue Lyrica      Relevant Medications   Semaglutide (RYBELSUS) 7 MG TABS     Genitourinary   CKD (chronic kidney disease) stage 4, GFR 15-29 ml/min (HCC)    Chronic, monitored by nephrology Renally dose medications Continue Veltassa 8.4mg  and Renvela 800mg       Anemia in stage 4 chronic kidney disease (HCC)    Last CBC reviewed  Other   Neuropathic pain (Chronic)    Not currently taking lyrica      Overweight    Discussed importance of healthy weight management Discussed diet and exercise      Other Visit Diagnoses     Need for influenza vaccination       Relevant Orders   Flu Vaccine QUAD High Dose(Fluad) (Completed)        Return in about 3 months (around 05/19/2021) for chronic disease f/u.      Nelva Nay, Medical Student 02/16/2021, 4:33 PM    Patient seen along with MS3 student Nelva Nay. I personally evaluated this patient along with the student, and verified all aspects of the history, physical exam, and medical decision making as documented by the student. I agree with the student's documentation and have made all necessary edits.  Lateefa Crosby, Dionne Bucy, MD, MPH [facility]

## 2021-02-16 NOTE — Assessment & Plan Note (Signed)
Not currently taking lyrica

## 2021-02-16 NOTE — Patient Instructions (Addendum)
Resume Renvela and Veltassa for the kidneys  Stop januvia and start rybelsus for the sugar Do not take any more than 1 pill per day of the metformin

## 2021-02-16 NOTE — Assessment & Plan Note (Addendum)
Patient does not notice Lyrica having any effect Discontinue Lyrica

## 2021-02-16 NOTE — Assessment & Plan Note (Signed)
Chronic, uncontrolled Last HgbA1C from nephro on 12/26/2020 was 7.7 Okay to continue metformin 500mg  once daily per nephrology Continue Jardiance 25mg   Continue glipizide 5mg  Start semaglutide 7mg  tablet Discontinue Januvia 50mg 

## 2021-02-16 NOTE — Assessment & Plan Note (Signed)
Chronic, monitored by nephrology Renally dose medications Continue Veltassa 8.4mg  and Renvela 800mg 

## 2021-02-16 NOTE — Assessment & Plan Note (Addendum)
Last CBC reviewed

## 2021-02-18 LAB — HEPATIC FUNCTION PANEL
ALT: 19 IU/L (ref 0–32)
AST: 18 IU/L (ref 0–40)
Albumin: 4.2 g/dL (ref 3.8–4.8)
Alkaline Phosphatase: 139 IU/L — ABNORMAL HIGH (ref 44–121)
Bilirubin Total: 0.6 mg/dL (ref 0.0–1.2)
Bilirubin, Direct: 0.19 mg/dL (ref 0.00–0.40)
Total Protein: 8.2 g/dL (ref 6.0–8.5)

## 2021-02-18 LAB — LIPID PANEL
Chol/HDL Ratio: 1.9 ratio (ref 0.0–4.4)
Cholesterol, Total: 105 mg/dL (ref 100–199)
HDL: 55 mg/dL (ref >39)
LDL Chol Calc (NIH): 34 mg/dL (ref 0–99)
Triglycerides: 80 mg/dL (ref 0–149)
VLDL Cholesterol Cal: 16 mg/dL (ref 5–40)

## 2021-03-23 ENCOUNTER — Other Ambulatory Visit: Payer: Self-pay | Admitting: Family Medicine

## 2021-03-23 NOTE — Telephone Encounter (Signed)
Requested Prescriptions  Pending Prescriptions Disp Refills  . metoprolol succinate (TOPROL-XL) 25 MG 24 hr tablet [Pharmacy Med Name: Metoprolol Succinate ER 25 MG Oral Tablet Extended Release 24 Hour] 90 tablet 1    Sig: Take 1 tablet by mouth once daily     Cardiovascular:  Beta Blockers Passed - 03/23/2021 10:20 AM      Passed - Last BP in normal range    BP Readings from Last 1 Encounters:  02/16/21 120/70         Passed - Last Heart Rate in normal range    Pulse Readings from Last 1 Encounters:  02/16/21 74         Passed - Valid encounter within last 6 months    Recent Outpatient Visits          1 month ago Type 2 diabetes mellitus with hyperglycemia, without long-term current use of insulin ([place])   [facility], Dionne Bucy, MD   5 months ago Encounter for annual wellness visit (AWV) in Medicare patient   [facility], Dionne Bucy, MD   9 months ago Type 2 diabetes mellitus with stage 3b chronic kidney disease, without long-term current use of insulin [facility])   [facility] Dionne Bucy, MD   12 months ago Need for immunization against influenza   [place], Dionne Bucy, MD   1 year ago Type 2 diabetes mellitus with hyperglycemia, without long-term current use of insulin [facility])   [place], Dionne Bucy, MD      Future Appointments            In 1 month Bacigalupo, Dionne Bucy, MD [facility], [place]   In 2 months Bacigalupo, Dionne Bucy, MD [facility]

## 2021-04-14 ENCOUNTER — Other Ambulatory Visit: Payer: Self-pay | Admitting: Family Medicine

## 2021-05-11 ENCOUNTER — Other Ambulatory Visit: Payer: Self-pay | Admitting: Family Medicine

## 2021-05-11 NOTE — Progress Notes (Signed)
Established patient visit   Patient: Kelli Gutierrez   DOB: 1951/04/05   71 y.o. Female  MRN: [id number] Visit Date: 05/12/2021  Today's healthcare provider: Lavon Paganini, MD   Chief Complaint  Patient presents with   follow up chronic disease   Subjective    HPI  Diabetes Mellitus Type II, follow-up  Lab Results  Component Value Date   HGBA1C 7.7 12/26/2020   HGBA1C 7.2 (A) 06/24/2020   HGBA1C 7.7 (A) 03/10/2020   Last seen for diabetes 2-3 months ago.  Management since then includes continue metformin 500mg  once daily per nephrology Continue Jardiance 25mg   Continue glipizide 5mg  Start semaglutide 7mg  tablet-finished sample and then picked up from Pharmacy - she is taking this. Discontinue Januvia 50mg   She reports excellent compliance with treatment. She is not having side effects.   Home blood sugar records:  150-200 fasting --------------------------------------------------------------------------------------------- Hypertension, follow-up  BP Readings from Last 3 Encounters:  05/12/21 122/70  02/16/21 120/70  10/09/20 (!) 138/54   Wt Readings from Last 3 Encounters:  05/12/21 155 lb 14.4 oz (70.7 kg)  02/16/21 157 lb 4.8 oz (71.4 kg)  10/09/20 156 lb (70.8 kg)     She was last seen for hypertension 2-3 months ago.  BP at that visit was 120/70. Management since that visit includes continue current medications.  Lipid/Cholesterol, follow-up  Last Lipid Panel: Lab Results  Component Value Date   CHOL 105 02/17/2021   LDLCALC 34 02/17/2021   HDL 55 02/17/2021   TRIG 80 02/17/2021    She was last seen for this 2-3 months ago.  Management since that visit includes Continue atorvastatin 20mg .  She reports excellent compliance with treatment. She is not having side effects.   Symptoms: No appetite changes No foot ulcerations  No chest pain No chest pressure/discomfort  No dyspnea No orthopnea  No fatigue Yes lower extremity edema  No  palpitations No paroxysmal nocturnal dyspnea  Yes nausea Yes numbness or tingling of extremity  Yes polydipsia No polyuria  No speech difficulty No syncope    Last metabolic panel Lab Results  Component Value Date   GLUCOSE 143 (H) 03/13/2020   NA 139 03/13/2020   K 5.7 (H) 03/13/2020   BUN 45 (H) 03/13/2020   CREATININE 2.22 (H) 03/13/2020   GFRNONAA 22 (L) 03/13/2020   CALCIUM 9.1 03/13/2020   AST 18 02/17/2021   ALT 19 02/17/2021   The ASCVD Risk score (Arnett DK, et al., 2019) failed to calculate for the following reasons:   The patient has a prior MI or stroke diagnosis  ---------------------------------------------------------------------------------------------------   Lump on inner R wrist x2 months. Not painful, mobile. No drainage.  Sinus congestion and pain x1 month   Medications: Outpatient Medications Prior to Visit  Medication Sig   aspirin 81 MG chewable tablet Chew 81 mg by mouth daily.   atorvastatin (LIPITOR) 20 MG tablet Take 1 tablet (20 mg total) by mouth daily.   clopidogrel (PLAVIX) 75 MG tablet Take 1 tablet (75 mg total) by mouth daily.   glipiZIDE (GLUCOTROL) 5 MG tablet TAKE 1/2 (ONE-HALF) TABLET BY MOUTH ONCE DAILY BEFORE BREAKFAST   glucose blood (CONTOUR NEXT TEST) test strip Use to test blood glucose as directed 1-4 times daily   hydroxychloroquine (PLAQUENIL) 200 MG tablet Take 200 mg by mouth daily.   Lancet Devices MISC Use to test blood glucose as directed 1-4 times daily   lisinopril (ZESTRIL) 20 MG tablet Take 1 tablet  by mouth once daily   metFORMIN (GLUCOPHAGE) 500 MG tablet Take 500 mg by mouth at bedtime.   metoprolol succinate (TOPROL-XL) 25 MG 24 hr tablet Take 1 tablet by mouth once daily   patiromer (VELTASSA) 8.4 g packet Take 1 packet (8.4 g total) by mouth daily.   pregabalin (LYRICA) 25 MG capsule Take 1 capsule (25 mg total) by mouth 2 (two) times daily.   sevelamer carbonate (RENVELA) 800 MG tablet Take 1 tablet (800 mg  total) by mouth 3 (three) times daily with meals.   [DISCONTINUED] JARDIANCE 25 MG TABS tablet TAKE 1 TABLET BY MOUTH ONCE DAILY BEFORE BREAKFAST   Semaglutide (RYBELSUS) 7 MG TABS Take 7 mg by mouth daily. (Patient not taking: Reported on 05/12/2021)   No facility-administered medications prior to visit.    Review of Systems per HPI     Objective    BP 122/70 (BP Location: Left Arm, Patient Position: Sitting, Cuff Size: Normal)    Pulse 68    Temp 97.8 F (36.6 C) (Oral)    Resp 16    Ht 5\' 2"  (1.575 m)    Wt 155 lb 14.4 oz (70.7 kg)    SpO2 99%    BMI 28.51 kg/m  {Show previous vital signs (optional):23777}  Physical Exam Vitals reviewed.  Constitutional:      General: She is not in acute distress.    Appearance: Normal appearance. She is well-developed. She is not diaphoretic.  HENT:     Head: Normocephalic and atraumatic.  Eyes:     General: No scleral icterus.    Conjunctiva/sclera: Conjunctivae normal.  Neck:     Thyroid: No thyromegaly.  Cardiovascular:     Rate and Rhythm: Normal rate and regular rhythm.     Pulses: Normal pulses.     Heart sounds: Normal heart sounds. No murmur heard. Pulmonary:     Effort: Pulmonary effort is normal. No respiratory distress.     Breath sounds: Normal breath sounds. No wheezing, rhonchi or rales.  Musculoskeletal:     Cervical back: Neck supple.     Right lower leg: No edema.     Left lower leg: No edema.     Comments: 2cm, mobile, nontender mass of anterior R forearm - see photo below  Lymphadenopathy:     Cervical: No cervical adenopathy.  Skin:    General: Skin is warm and dry.     Findings: No rash.  Neurological:     Mental Status: She is alert and oriented to person, place, and time. Mental status is at baseline.  Psychiatric:        Mood and Affect: Mood normal.        Behavior: Behavior normal.        No results found for any visits on 05/12/21.  Assessment & Plan     Problem List Items Addressed This Visit        Cardiovascular and Mediastinum   Hypertension associated with diabetes ([place])    Well controlled Continue current medications Reviewed recent metabolic panel        Respiratory   COPD, mild ([place])    Chronic and well-controlled Not currently on medications      Acute non-recurrent pansinusitis    - symptoms and exam c/w sinusitis   - no evidence of AOM, CAP, strep pharyngitis, or other infection - given duration of symptoms, suspect bacterial etiology - will treat with Augmentin x7d - discussed symptomatic management (flonase, decongestants, etc), natural course,  and return precautions        Relevant Medications   amoxicillin-clavulanate (AUGMENTIN) 875-125 MG tablet     Endocrine   Type 2 diabetes mellitus with hyperglycemia, without long-term current use of insulin (HCC) - Primary    Chronic and previously uncontrolled hyperglycemia Unable to get point-of-care A1c during visit today, so gave her a lab slip to have done with her upcoming nephrology labs on 05/14/2021 Continue metformin 500 mg once daily, Jardiance 25 mg daily, glipizide 5 mg daily, Rybelsus 7 mg daily No longer on Januvia Need to renally dose all of her medications going forward Pending A1c, consider increasing Rybelsus dose      Relevant Orders   Hemoglobin A1c   Hyperlipidemia associated with type 2 diabetes mellitus ([place])    Well-controlled on recent check Continue atorvastatin 20 mg daily      Diabetic peripheral neuropathy (HCC)    Chronic and stable Did not notice any difference with Lyrica and this was discontinued at last visit        Other   Mass of soft tissue of wrist    New problem times 2 months Unclear etiology Does not seem to follow the path of a vein and it is not swollen or tender which goes against it being a possible upper extremity DVT Does not involve the joint space and is not consistent with a ganglion cyst Will refer to surgery to see if they are able to biopsy  versus image to determine what this is      Relevant Orders   Ambulatory referral to General Surgery     Return in about 3 months (around 08/09/2021) for chronic disease f/u.      I, Lavon Paganini, MD, have reviewed all documentation for this visit. The documentation on 05/12/21 for the exam, diagnosis, procedures, and orders are all accurate and complete.   Tywon Niday, Dionne Bucy, MD, MPH [facility]

## 2021-05-11 NOTE — Telephone Encounter (Signed)
Requested medication (s) are due for refill today: has newer rx for this med written 04/14/21  Requested medication (s) are on the active medication list: yes  Last refill:  04/14/21  Future visit scheduled: tomorrow 05/12/21.  Notes to clinic:  labs show as out of date but there are recent labs in results. The creatinine and the K+ are both elevated, please assess.   Requested Prescriptions  Pending Prescriptions Disp Refills   lisinopril (ZESTRIL) 20 MG tablet [Pharmacy Med Name: Lisinopril 20 MG Oral Tablet] 90 tablet 0    Sig: Take 1 tablet by mouth once daily     Cardiovascular:  ACE Inhibitors Failed - 05/11/2021  1:30 PM      Failed - Cr in normal range and within 180 days    Creatinine  Date Value Ref Range Status  10/10/2012 0.93 0.60 - 1.30 mg/dL Final   Creatinine, Ser  Date Value Ref Range Status  03/13/2020 2.22 (H) 0.57 - 1.00 mg/dL Final          Failed - K in normal range and within 180 days    Potassium  Date Value Ref Range Status  03/13/2020 5.7 (H) 3.5 - 5.2 mmol/L Final  10/10/2012 4.0 3.5 - 5.1 mmol/L Final          Passed - Patient is not pregnant      Passed - Last BP in normal range    BP Readings from Last 1 Encounters:  02/16/21 120/70          Passed - Valid encounter within last 6 months    Recent Outpatient Visits           2 months ago Type 2 diabetes mellitus with hyperglycemia, without long-term current use of insulin ([place])   [facility], Dionne Bucy, MD   7 months ago Encounter for annual wellness visit (AWV) in Medicare patient   [employer] [facility], Dionne Bucy, MD   10 months ago Type 2 diabetes mellitus with stage 3b chronic kidney disease, without long-term current use of insulin ([place])   [facility], Dionne Bucy, MD   1 year ago Need for immunization against influenza   [facility] [place], Dionne Bucy, MD   1 year ago Type 2 diabetes mellitus with  hyperglycemia, without long-term current use of insulin [facility])   [place], Dionne Bucy, MD       Future Appointments             Tomorrow Bacigalupo, Dionne Bucy, MD [facility], [facility]             JARDIANCE 25 MG TABS tablet [Pharmacy Med Name: Jardiance 25 MG Oral Tablet] 90 tablet 0    Sig: TAKE 1 TABLET BY MOUTH ONCE DAILY BEFORE BREAKFAST     Endocrinology:  Diabetes - SGLT2 Inhibitors Failed - 05/11/2021  1:30 PM      Failed - Cr in normal range and within 360 days    Creatinine  Date Value Ref Range Status  10/10/2012 0.93 0.60 - 1.30 mg/dL Final   Creatinine, Ser  Date Value Ref Range Status  03/13/2020 2.22 (H) 0.57 - 1.00 mg/dL Final          Failed - eGFR in normal range and within 360 days    EGFR (African American)  Date Value Ref Range Status  10/10/2012 >60  Final   GFR calc Af Wyvonnia Lora  Date Value Ref Range Status  03/13/2020 25 (L) >59 mL/min/1.73 Final    Comment:    **In accordance with recommendations from the NKF-ASN Task force,**   [facility] is in the process of updating its eGFR calculation to the   2021 CKD-EPI creatinine equation that estimates kidney function   without a race variable.    EGFR (Non-African Amer.)  Date Value Ref Range Status  10/10/2012 >60  Final    Comment:    eGFR values <6m/min/1.73 m2 may be an indication of chronic kidney disease (CKD). Calculated eGFR is useful in patients with stable renal function. The eGFR calculation will not be reliable in acutely ill patients when serum creatinine is changing rapidly. It is not useful in  patients on dialysis. The eGFR calculation may not be applicable to patients at the low and high extremes of body sizes, pregnant women, and vegetarians.    GFR calc non Af Amer  Date Value Ref Range Status  03/13/2020 22 (L) >59 mL/min/1.73 Final          Passed - LDL in normal range and within 360 days    LDL Chol Calc (NIH)  Date Value Ref Range  Status  02/17/2021 34 0 - 99 mg/dL Final          Passed - HBA1C is between 0 and 7.9 and within 180 days    Hemoglobin A1C  Date Value Ref Range Status  12/26/2020 7.7  Final          Passed - Valid encounter within last 6 months    Recent Outpatient Visits           2 months ago Type 2 diabetes mellitus with hyperglycemia, without long-term current use of insulin ([facility]   [facility]   7 months ago Encounter for annual wellness visit (AWV) in Medicare patient   [facility]   10 months ago Type 2 diabetes mellitus with stage 3b chronic kidney disease, without long-term current use of insulin ([facility]   [facility]   1 year ago Need for immunization against influenza   [facility]   1 year ago Type 2 diabetes mellitus with hyperglycemia, without long-term current use of insulin ([facility]   [place]ne [place]       Future Appointments             Tomorrow Bacigalupo, [facility]

## 2021-05-12 ENCOUNTER — Other Ambulatory Visit: Payer: Self-pay

## 2021-05-12 ENCOUNTER — Encounter: Payer: Self-pay | Admitting: Family Medicine

## 2021-05-12 ENCOUNTER — Ambulatory Visit (INDEPENDENT_AMBULATORY_CARE_PROVIDER_SITE_OTHER): Payer: Medicare Other | Admitting: Family Medicine

## 2021-05-12 VITALS — BP 122/70 | HR 68 | Temp 97.8°F | Resp 16 | Ht 62.0 in | Wt 155.9 lb

## 2021-05-12 DIAGNOSIS — E1165 Type 2 diabetes mellitus with hyperglycemia: Secondary | ICD-10-CM | POA: Diagnosis not present

## 2021-05-12 DIAGNOSIS — E1159 Type 2 diabetes mellitus with other circulatory complications: Secondary | ICD-10-CM | POA: Diagnosis not present

## 2021-05-12 DIAGNOSIS — E1169 Type 2 diabetes mellitus with other specified complication: Secondary | ICD-10-CM | POA: Diagnosis not present

## 2021-05-12 DIAGNOSIS — I152 Hypertension secondary to endocrine disorders: Secondary | ICD-10-CM

## 2021-05-12 DIAGNOSIS — J0140 Acute pansinusitis, unspecified: Secondary | ICD-10-CM | POA: Insufficient documentation

## 2021-05-12 DIAGNOSIS — E1142 Type 2 diabetes mellitus with diabetic polyneuropathy: Secondary | ICD-10-CM | POA: Diagnosis not present

## 2021-05-12 DIAGNOSIS — J449 Chronic obstructive pulmonary disease, unspecified: Secondary | ICD-10-CM

## 2021-05-12 DIAGNOSIS — E785 Hyperlipidemia, unspecified: Secondary | ICD-10-CM

## 2021-05-12 DIAGNOSIS — M7989 Other specified soft tissue disorders: Secondary | ICD-10-CM | POA: Insufficient documentation

## 2021-05-12 MED ORDER — EMPAGLIFLOZIN 25 MG PO TABS
25.0000 mg | ORAL_TABLET | Freq: Every day | ORAL | 1 refills | Status: DC
Start: 2021-05-12 — End: 2022-02-04

## 2021-05-12 MED ORDER — AMOXICILLIN-POT CLAVULANATE 875-125 MG PO TABS: 1.0000 | ORAL_TABLET | Freq: Two times a day (BID) | ORAL | 0 refills | Status: DC

## 2021-05-12 NOTE — Assessment & Plan Note (Signed)
New problem times 2 months Unclear etiology Does not seem to follow the path of a vein and it is not swollen or tender which goes against it being a possible upper extremity DVT Does not involve the joint space and is not consistent with a ganglion cyst Will refer to surgery to see if they are able to biopsy versus image to determine what this is

## 2021-05-12 NOTE — Assessment & Plan Note (Signed)
-   symptoms and exam c/w sinusitis   - no evidence of AOM, CAP, strep pharyngitis, or other infection - given duration of symptoms, suspect bacterial etiology - will treat with Augmentin x7d - discussed symptomatic management (flonase, decongestants, etc), natural course, and return precautions

## 2021-05-12 NOTE — Assessment & Plan Note (Signed)
Chronic and stable Did not notice any difference with Lyrica and this was discontinued at last visit

## 2021-05-12 NOTE — Assessment & Plan Note (Signed)
Well-controlled on recent check Continue atorvastatin 20 mg daily

## 2021-05-12 NOTE — Assessment & Plan Note (Signed)
Well controlled Continue current medications Reviewed recent metabolic panel 

## 2021-05-12 NOTE — Addendum Note (Signed)
Addended by: Virginia Crews on: 05/12/2021 09:22 AM   Modules accepted: Orders

## 2021-05-12 NOTE — Assessment & Plan Note (Signed)
Chronic and well-controlled Not currently on medications

## 2021-05-12 NOTE — Assessment & Plan Note (Signed)
Chronic and previously uncontrolled hyperglycemia Unable to get point-of-care A1c during visit today, so gave her a lab slip to have done with her upcoming nephrology labs on 05/14/2021 Continue metformin 500 mg once daily, Jardiance 25 mg daily, glipizide 5 mg daily, Rybelsus 7 mg daily No longer on Januvia Need to renally dose all of her medications going forward Pending A1c, consider increasing Rybelsus dose

## 2021-05-19 ENCOUNTER — Other Ambulatory Visit: Payer: Self-pay

## 2021-05-19 ENCOUNTER — Other Ambulatory Visit: Payer: Self-pay | Admitting: Family Medicine

## 2021-05-19 ENCOUNTER — Ambulatory Visit (INDEPENDENT_AMBULATORY_CARE_PROVIDER_SITE_OTHER): Payer: Medicare Other | Admitting: Surgery

## 2021-05-19 ENCOUNTER — Encounter: Payer: Self-pay | Admitting: Surgery

## 2021-05-19 VITALS — BP 127/71 | HR 78 | Temp 97.9°F | Ht 62.0 in | Wt 156.4 lb

## 2021-05-19 DIAGNOSIS — R2231 Localized swelling, mass and lump, right upper limb: Secondary | ICD-10-CM | POA: Diagnosis not present

## 2021-05-19 DIAGNOSIS — M7989 Other specified soft tissue disorders: Secondary | ICD-10-CM | POA: Diagnosis not present

## 2021-05-19 DIAGNOSIS — Z8673 Personal history of transient ischemic attack (TIA), and cerebral infarction without residual deficits: Secondary | ICD-10-CM

## 2021-05-19 NOTE — Telephone Encounter (Signed)
Zestril already addressed by different means. Requested Prescriptions  Pending Prescriptions Disp Refills   pregabalin (LYRICA) 25 MG capsule [Pharmacy Med Name: Pregabalin 25 MG Oral Capsule] 60 capsule 0    Sig: Take 1 capsule by mouth twice daily     Not Delegated - Neurology:  Anticonvulsants - Controlled - pregabalin Failed - 05/19/2021 12:06 PM      Failed - This refill cannot be delegated      Failed - Cr in normal range and within 360 days    Creatinine  Date Value Ref Range Status  10/10/2012 0.93 0.60 - 1.30 mg/dL Final   Creatinine, Ser  Date Value Ref Range Status  03/13/2020 2.22 (H) 0.57 - 1.00 mg/dL Final         Passed - Completed PHQ-2 or PHQ-9 in the last 360 days      Passed - Valid encounter within last 12 months    Recent Outpatient Visits          1 week ago Type 2 diabetes mellitus with hyperglycemia, without long-term current use of insulin ([place])   [facility], Dionne Bucy, MD   3 months ago Type 2 diabetes mellitus with hyperglycemia, without long-term current use of insulin ([place])   [facility] [place], Dionne Bucy, MD   7 months ago Encounter for annual wellness visit (AWV) in Medicare patient   [facility] [place], Dionne Bucy, MD   10 months ago Type 2 diabetes mellitus with stage 3b chronic kidney disease, without long-term current use of insulin ([place])   [facility] [place], Dionne Bucy, MD   1 year ago Need for immunization against influenza   [facility] [place], Dionne Bucy, MD      Future Appointments            In 3 months Bacigalupo, Dionne Bucy, MD [facility], [facility]            clopidogrel (PLAVIX) 75 MG tablet [Pharmacy Med Name: Clopidogrel Bisulfate 75 MG Oral Tablet] 90 tablet 0    Sig: Take 1 tablet by mouth once daily     Hematology: Antiplatelets - clopidogrel Failed - 05/19/2021 12:06 PM      Failed - HCT in normal range and within  180 days    Hematocrit  Date Value Ref Range Status  10/09/2020 35.9 34.0 - 46.6 % Final         Failed - HGB in normal range and within 180 days    Hemoglobin  Date Value Ref Range Status  10/09/2020 11.1 11.1 - 15.9 g/dL Final         Failed - PLT in normal range and within 180 days    Platelets  Date Value Ref Range Status  10/09/2020 193 150 - 450 x10E3/uL Final         Failed - Cr in normal range and within 360 days    Creatinine  Date Value Ref Range Status  10/10/2012 0.93 0.60 - 1.30 mg/dL Final   Creatinine, Ser  Date Value Ref Range Status  03/13/2020 2.22 (H) 0.57 - 1.00 mg/dL Final         Passed - Valid encounter within last 6 months    Recent Outpatient Visits          1 week ago Type 2 diabetes mellitus with hyperglycemia, without long-term current use of insulin [facility])   [place], Dionne Bucy, MD   3 months ago  Type 2 diabetes mellitus with hyperglycemia, without long-term current use of insulin ([place])   [facility] [place], Dionne Bucy, MD   7 months ago Encounter for annual wellness visit (AWV) in Medicare patient   [facility] [place], Dionne Bucy, MD   10 months ago Type 2 diabetes mellitus with stage 3b chronic kidney disease, without long-term current use of insulin ([place])   [facility] [place], Dionne Bucy, MD   1 year ago Need for immunization against influenza   [facility] [place], Dionne Bucy, MD      Future Appointments            In 3 months Bacigalupo, Dionne Bucy, MD [facility]           Refused Prescriptions Disp Refills   lisinopril (ZESTRIL) 20 MG tablet [Pharmacy Med Name: Lisinopril 20 MG Oral Tablet] 90 tablet 0    Sig: Take 1 tablet by mouth once daily     Cardiovascular:  ACE Inhibitors Failed - 05/19/2021 12:06 PM      Failed - Cr in normal range and within 180 days    Creatinine  Date Value Ref Range Status  10/10/2012  0.93 0.60 - 1.30 mg/dL Final   Creatinine, Ser  Date Value Ref Range Status  03/13/2020 2.22 (H) 0.57 - 1.00 mg/dL Final         Failed - K in normal range and within 180 days    Potassium  Date Value Ref Range Status  03/13/2020 5.7 (H) 3.5 - 5.2 mmol/L Final  10/10/2012 4.0 3.5 - 5.1 mmol/L Final         Passed - Patient is not pregnant      Passed - Last BP in normal range    BP Readings from Last 1 Encounters:  05/19/21 127/71         Passed - Valid encounter within last 6 months    Recent Outpatient Visits          1 week ago Type 2 diabetes mellitus with hyperglycemia, without long-term current use of insulin ([place])   [facility], [facility], MD   3 months ago Type 2 diabetes mellitus with hyperglycemia, without long-term current use of insulin [facility])   [facility], Dionne Bucy, MD   7 months ago Encounter for annual wellness visit (AWV) in Medicare patient   [place], Dionne Bucy, MD   10 months ago Type 2 diabetes mellitus with stage 3b chronic kidney disease, without long-term current use of insulin [facility])   [place], Dionne Bucy, MD   1 year ago Need for immunization against influenza   [facility], Dionne Bucy, MD      Future Appointments            In 3 months Bacigalupo, Dionne Bucy, MD [facility], [facility]

## 2021-05-19 NOTE — Telephone Encounter (Signed)
Requested medication (s) are due for refill today: yes  Requested medication (s) are on the active medication list: yes  Last refill:  Lyrica 10/09/20 #60 with 5 RF, Plavix 11/28/20 #90 with 1 RF  Future visit scheduled: 08/31/21  Notes to clinic Lyrica not delegated, Plavix creatinine lab from 2021, please assess.    Requested Prescriptions  Pending Prescriptions Disp Refills   pregabalin (LYRICA) 25 MG capsule [Pharmacy Med Name: Pregabalin 25 MG Oral Capsule] 60 capsule 0    Sig: Take 1 capsule by mouth twice daily     Not Delegated - Neurology:  Anticonvulsants - Controlled - pregabalin Failed - 05/19/2021 12:06 PM      Failed - This refill cannot be delegated      Failed - Cr in normal range and within 360 days    Creatinine  Date Value Ref Range Status  10/10/2012 0.93 0.60 - 1.30 mg/dL Final   Creatinine, Ser  Date Value Ref Range Status  03/13/2020 2.22 (H) 0.57 - 1.00 mg/dL Final          Passed - Completed PHQ-2 or PHQ-9 in the last 360 days      Passed - Valid encounter within last 12 months    Recent Outpatient Visits           1 week ago Type 2 diabetes mellitus with hyperglycemia, without long-term current use of insulin ([place])   [facility] [place], Dionne Bucy, MD   3 months ago Type 2 diabetes mellitus with hyperglycemia, without long-term current use of insulin ([place])   [facility] [place], Dionne Bucy, MD   7 months ago Encounter for annual wellness visit (AWV) in Medicare patient   [facility] [place], Dionne Bucy, MD   10 months ago Type 2 diabetes mellitus with stage 3b chronic kidney disease, without long-term current use of insulin ([place])   [facility] [place], Dionne Bucy, MD   1 year ago Need for immunization against influenza   [facility], Dionne Bucy, MD       Future Appointments             In 3 months Bacigalupo, Dionne Bucy, MD [facility], [facility]              clopidogrel (PLAVIX) 75 MG tablet [Pharmacy Med Name: Clopidogrel Bisulfate 75 MG Oral Tablet] 90 tablet 0    Sig: Take 1 tablet by mouth once daily     Hematology: Antiplatelets - clopidogrel Failed - 05/19/2021 12:06 PM      Failed - HCT in normal range and within 180 days    Hematocrit  Date Value Ref Range Status  10/09/2020 35.9 34.0 - 46.6 % Final          Failed - HGB in normal range and within 180 days    Hemoglobin  Date Value Ref Range Status  10/09/2020 11.1 11.1 - 15.9 g/dL Final          Failed - PLT in normal range and within 180 days    Platelets  Date Value Ref Range Status  10/09/2020 193 150 - 450 x10E3/uL Final          Failed - Cr in normal range and within 360 days    Creatinine  Date Value Ref Range Status  10/10/2012 0.93 0.60 - 1.30 mg/dL Final   Creatinine, Ser  Date Value Ref Range Status  03/13/2020 2.22 (H) 0.57 - 1.00 mg/dL Final  Passed - Valid encounter within last 6 months    Recent Outpatient Visits           1 week ago Type 2 diabetes mellitus with hyperglycemia, without long-term current use of insulin ([place])   [facility] [place], Dionne Bucy, MD   3 months ago Type 2 diabetes mellitus with hyperglycemia, without long-term current use of insulin ([place])   [facility], Dionne Bucy, MD   7 months ago Encounter for annual wellness visit (AWV) in Medicare patient   [facility] [place], Dionne Bucy, MD   10 months ago Type 2 diabetes mellitus with stage 3b chronic kidney disease, without long-term current use of insulin ([place])   [facility] [place], Dionne Bucy, MD   1 year ago Need for immunization against influenza   [facility] [place], Dionne Bucy, MD       Future Appointments             In 3 months Bacigalupo, Dionne Bucy, MD [facility], [facility]            Refused Prescriptions Disp Refills   lisinopril  (ZESTRIL) 20 MG tablet [Pharmacy Med Name: Lisinopril 20 MG Oral Tablet] 90 tablet 0    Sig: Take 1 tablet by mouth once daily     Cardiovascular:  ACE Inhibitors Failed - 05/19/2021 12:06 PM      Failed - Cr in normal range and within 180 days    Creatinine  Date Value Ref Range Status  10/10/2012 0.93 0.60 - 1.30 mg/dL Final   Creatinine, Ser  Date Value Ref Range Status  03/13/2020 2.22 (H) 0.57 - 1.00 mg/dL Final          Failed - K in normal range and within 180 days    Potassium  Date Value Ref Range Status  03/13/2020 5.7 (H) 3.5 - 5.2 mmol/L Final  10/10/2012 4.0 3.5 - 5.1 mmol/L Final          Passed - Patient is not pregnant      Passed - Last BP in normal range    BP Readings from Last 1 Encounters:  05/19/21 127/71          Passed - Valid encounter within last 6 months    Recent Outpatient Visits           1 week ago Type 2 diabetes mellitus with hyperglycemia, without long-term current use of insulin ([place])   [facility], Dionne Bucy, MD   3 months ago Type 2 diabetes mellitus with hyperglycemia, without long-term current use of insulin [facility])   [facility], Dionne Bucy, MD   7 months ago Encounter for annual wellness visit (AWV) in Medicare patient   [place], Dionne Bucy, MD   10 months ago Type 2 diabetes mellitus with stage 3b chronic kidney disease, without long-term current use of insulin [facility])   [place], Dionne Bucy, MD   1 year ago Need for immunization against influenza   [place], Dionne Bucy, MD       Future Appointments             In 3 months Bacigalupo, Dionne Bucy, MD [facility], [facility]

## 2021-05-19 NOTE — Patient Instructions (Addendum)
If you have any concerns or questions, please feel free to call our office. See follow up appointment below. 

## 2021-05-19 NOTE — Progress Notes (Signed)
Patient ID: Kelli Gutierrez, female   DOB: 11/28/1950, 71 y.o.   MRN: [id number]  Chief Complaint: Subcutaneous lesion right volar forearm  History of Present Illness TIA Kelli Gutierrez is a 71 y.o. female with with history of RA, she seems to have utilize some form of compressive wrap about 2 months ago with subsequent development of a subcutaneous lesion in that area.  She denies any pain, discharge, inflammatory changes, skin color changes etc. in this area.  She presents to have it evaluated, does not feel any particular need to have it excised.  Past Medical History Past Medical History:  Diagnosis Date   Acute CVA (cerebrovascular accident) ([place]) 12/17/2014   Anemia    Breast pain, right 06/01/2015   Chronic kidney disease 12/2015   CVA (cerebral infarction) 12/17/2014   Diabetes mellitus without complication (HCC)    GERD (gastroesophageal reflux disease)    Hyperlipidemia    Hypertension    Lumbar radiculitis 12/06/2013   Meningitis    Rheumatoid arthritis ([place])    Stroke [facility])       Past Surgical History:  Procedure Laterality Date   APPENDECTOMY     CESAREAN SECTION     4   CHOLECYSTECTOMY     MIDDLE EAR SURGERY      Allergies  Allergen Reactions   Hydrocodone     Other reaction(s): Dizziness    Current Outpatient Medications  Medication Sig Dispense Refill   aspirin 81 MG chewable tablet Chew 81 mg by mouth daily.     atorvastatin (LIPITOR) 20 MG tablet Take 1 tablet (20 mg total) by mouth daily. 90 tablet 3   clopidogrel (PLAVIX) 75 MG tablet Take 1 tablet (75 mg total) by mouth daily. 90 tablet 1   empagliflozin (JARDIANCE) 25 MG TABS tablet Take 1 tablet (25 mg total) by mouth daily before breakfast. 90 tablet 1   glipiZIDE (GLUCOTROL) 5 MG tablet TAKE 1/2 (ONE-HALF) TABLET BY MOUTH ONCE DAILY BEFORE BREAKFAST 45 tablet 0   glucose blood (CONTOUR NEXT TEST) test strip Use to test blood glucose as directed 1-4 times daily 100 each 12   hydroxychloroquine (PLAQUENIL) 200 MG  tablet Take 200 mg by mouth daily.     Lancet Devices MISC Use to test blood glucose as directed 1-4 times daily 100 each 3   lisinopril (ZESTRIL) 20 MG tablet Take 1 tablet by mouth once daily 90 tablet 0   metFORMIN (GLUCOPHAGE) 500 MG tablet Take 500 mg by mouth at bedtime.     metoprolol succinate (TOPROL-XL) 25 MG 24 hr tablet Take 1 tablet by mouth once daily 90 tablet 1   patiromer (VELTASSA) 8.4 g packet Take 1 packet (8.4 g total) by mouth daily. 30 each 5   pregabalin (LYRICA) 25 MG capsule Take 1 capsule (25 mg total) by mouth 2 (two) times daily. 60 capsule 5   sevelamer carbonate (RENVELA) 800 MG tablet Take 1 tablet (800 mg total) by mouth 3 (three) times daily with meals. 90 tablet 11   Semaglutide (RYBELSUS) 7 MG TABS Take 7 mg by mouth daily. (Patient not taking: Reported on 05/12/2021) 30 tablet 5   No current facility-administered medications for this visit.    Family History Family History  Problem Relation Age of Onset   Leukemia Mother        unsure of conditions, was "healthy, old lady"   Stroke Father    Diabetes Father    Diabetes Sister    Breast cancer Neg Hx  Colon cancer Neg Hx       Social History Social History   Tobacco Use   Smoking status: Never   Smokeless tobacco: Never  Vaping Use   Vaping Use: Never used  Substance Use Topics   Alcohol use: No   Drug use: Never        Review of Systems  Constitutional: Negative.   HENT:  Positive for hearing loss and tinnitus.   Eyes:  Positive for blurred vision.  Respiratory: Negative.    Cardiovascular:  Positive for orthopnea and leg swelling.  Gastrointestinal:  Positive for constipation.  Genitourinary: Negative.   Skin:  Positive for itching.  Neurological: Negative.   Psychiatric/Behavioral:  Positive for depression.      Physical Exam Blood pressure 127/71, pulse 78, temperature 97.9 F (36.6 C), temperature source Oral, height 5\' 2"  (1.575 m), weight 156 lb 6.4 oz (70.9 kg), SpO2  100 %. Last Weight  Most recent update: 05/19/2021 10:23 AM    Weight  70.9 kg (156 lb 6.4 oz)             CONSTITUTIONAL: Well developed, and nourished, appropriately responsive and aware without distress.   EYES: Sclera non-icteric.   EARS, NOSE, MOUTH AND THROAT: Mask worn.    Hearing is intact to voice.  NECK: Trachea is midline, and there is no jugular venous distension.  LYMPH NODES:  Lymph nodes in the neck are not enlarged. RESPIRATORY:  Lungs are clear, and breath sounds are equal bilaterally. Normal respiratory effort without pathologic use of accessory muscles. CARDIOVASCULAR: Heart is regular in rate and rhythm. GI: The abdomen is soft, nontender, and nondistended.  MUSCULOSKELETAL:  Symmetrical muscle tone appreciated in all four extremities.    SKIN: Skin turgor is normal. No pathologic skin lesions appreciated.   The more proximal aspect of the lesion as noted in the photo below appears to be contiguous with the serpiginous density that is likely a thrombosed superficial vein.  It is quite mobile, and with the patient's thin skin there readily apparent to palpation being just under 2.5 cm in length, and about a centimeter in width.  I am concerned with the paucity of underlying soft tissues we may expose some vulnerable tendinous structures that excision may do more harm than good.   NEUROLOGIC:  Motor and sensation appear grossly normal.  Cranial nerves are grossly without defect. PSYCH:  Alert and oriented to person, place and time. Affect is appropriate for situation.  Data Reviewed I have personally reviewed what is currently available of the patient's imaging, recent labs and medical records.   Labs:  CBC Latest Ref Rng & Units 10/09/2020 05/07/2019 02/03/2019  WBC 3.4 - 10.8 x10E3/uL 5.4 7.3 7.6  Hemoglobin 11.1 - 15.9 g/dL 11.1 11.8 13.1  Hematocrit 34.0 - 46.6 % 35.9 35.8 41.0  Platelets 150 - 450 x10E3/uL 193 238 280   CMP Latest Ref Rng & Units 02/17/2021  03/13/2020 05/07/2019  Glucose 65 - 99 mg/dL - 143(H) 91  BUN 8 - 27 mg/dL - 45(H) 28(H)  Creatinine 0.57 - 1.00 mg/dL - 2.22(H) 1.52(H)  Sodium 134 - 144 mmol/L - 139 140  Potassium 3.5 - 5.2 mmol/L - 5.7(H) 4.6  Chloride 96 - 106 mmol/L - 107(H) 108(H)  CO2 20 - 29 mmol/L - 18(L) 16(L)  Calcium 8.7 - 10.3 mg/dL - 9.1 9.4  Total Protein 6.0 - 8.5 g/dL 8.2 7.6 7.9  Total Bilirubin 0.0 - 1.2 mg/dL 0.6 0.4 0.4  Alkaline Phos  44 - 121 IU/L 139(H) 115 111  AST 0 - 40 IU/L 18 37 16  ALT 0 - 32 IU/L 19 50(H) 13      Imaging:  Within last 24 hrs: No results found.  Assessment    Lesion right forearm, 6 consistent with benign etiology, suspect related to adjacent superficial venous thrombosis. 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 ([place]) 05/08/2019   Anemia in stage 4 chronic kidney disease ([place]) 05/08/2019   Current moderate episode of major depressive disorder without prior episode ([place]) 05/07/2019   Neuropathic pain 09/14/2016   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) 09/14/2016   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) 08/18/2016   Vitamin D deficiency 06/23/2016   CKD (chronic kidney disease) stage 4, GFR 15-29 ml/min ([place]) 03/23/2016   Type 2 diabetes mellitus with hyperglycemia, without long-term current use of insulin ([place]) 03/18/2016   Rheumatoid arthritis 03/18/2016   Current use of long term anticoagulation (Plavix) 03/18/2016   Hyperlipidemia associated with type 2 diabetes mellitus ([place]) 03/18/2016   GERD (gastroesophageal reflux disease) 03/18/2016   Hypertension associated with diabetes ([place]) 09/30/2015   History of CVA  (cerebrovascular accident) 04/02/2015    Plan    This appears completely benign, and does not appear to bother her at all from a cosmetic appearance or a functional appearance.  She felt nothing needed to be done in terms of excision, and I agree I think observation as more than reasonable considering the risks of potential infection or deeper scarring involved with excision.  I suspect this may be a low-grade result of thrombosis of some superficial veins in this area.  We will be glad to see her back in 2 months for reevaluation, we remain readily available to assist her with excision should she wish to pursue it.  But I believe there is very little threat to this lesion at the present time.  Face-to-face time spent with the patient and accompanying care providers(if present) was 30 minutes, with more than 50% of the time spent counseling, educating, and coordinating care of the patient.    These notes generated with voice recognition software. I apologize for typographical errors.  Ronny Bacon M.D., FACS 05/19/2021, 10:36 AM

## 2021-05-22 ENCOUNTER — Ambulatory Visit: Payer: Medicare Other | Admitting: Family Medicine

## 2021-05-26 ENCOUNTER — Ambulatory Visit: Payer: Self-pay | Admitting: *Deleted

## 2021-05-26 NOTE — Telephone Encounter (Signed)
I returned pt's call.   She called c/o not being better after the antibiotics given to her Friday.   She is coughing a lot of mucus up.  Did a Covid test and it was negative.   Reason for Disposition  [1] Continuous (nonstop) coughing interferes with work or school AND [2] no improvement using cough treatment per Care Advice    Still not better after the 7 days of medicine and antibiotics  Answer Assessment - Initial Assessment Questions 1. ONSET: "When did the cough begin?"      My nose has been bleeding from the left side.   I take a blood thinner and an aspirin.  I've  been sneezing a lot.   It's thick, clots of blood.   It has stopped bleeding.   I instructed her how to stop a nose bleed and to go to the ED if she can't get the bleeding stopped after 15 minutes since she is on a blood thinner.   She verbalized understanding of my instructions.   Coughing up yellow mucus-green tinged. 2. SEVERITY: "How bad is the cough today?"      It's not better.   I've coughed so much my insides are hurting.    3. SPUTUM: "Describe the color of your sputum" (none, dry cough; clear, white, yellow, green)     Yellow-green.     I finished the antibiotic on Friday.   I finished all the medicine the Dr. gave me.    4. HEMOPTYSIS: "Are you coughing up any blood?" If so ask: "How much?" (flecks, streaks, tablespoons, etc.)     No 5. DIFFICULTY BREATHING: "Are you having difficulty breathing?" If Yes, ask: "How bad is it?" (e.g., mild, moderate, severe)    - MILD: No SOB at rest, mild SOB with walking, speaks normally in sentences, can lie down, no retractions, pulse < 100.    - MODERATE: SOB at rest, SOB with minimal exertion and prefers to sit, cannot lie down flat, speaks in phrases, mild retractions, audible wheezing, pulse 100-120.    - SEVERE: Very SOB at rest, speaks in single words, struggling to breathe, sitting hunched forward, retractions, pulse > 120      Sore and hurting in chest when coughing.    "I've coughed so much I hurt inside. 6. FEVER: "Do you have a fever?" If Yes, ask: "What is your temperature, how was it measured, and when did it start?"     Having chills but I'm always cold.   No fever 7. CARDIAC HISTORY: "Do you have any history of heart disease?" (e.g., heart attack, congestive heart failure)      *No Answer* 8. LUNG HISTORY: "Do you have any history of lung disease?"  (e.g., pulmonary embolus, asthma, emphysema)     *No Answer* 9. PE RISK FACTORS: "Do you have a history of blood clots?" (or: recent major surgery, recent prolonged travel, bedridden)     *No Answer* 10. OTHER SYMPTOMS: "Do you have any other symptoms?" (e.g., runny nose, wheezing, chest pain)       *No Answer* 11. PREGNANCY: "Is there any chance you are pregnant?" "When was your last menstrual period?"       *No Answer* 12. TRAVEL: "Have you traveled out of the country in the last month?" (e.g., travel history, exposures)       *No Answer*  Protocols used: Cough - Acute Productive-A-AH

## 2021-05-26 NOTE — Telephone Encounter (Signed)
°  Chief Complaint: not better after the 7 days of medications and antibiotics, left side of nose bleeding intermittently.   On Plavix and an aspirin a day.  C/o chest hurting and being sore when she coughs. Symptoms: cough up yellow-green mucus, cough seems worse Frequency: "A lot"   Cough seems worse Pertinent Negatives: Patient denies Fever. Disposition: [] ED /[] Urgent Care (no appt availability in office) / [] Appointment(In office/virtual)/ []  [facility]/ [] Home Care/ [] Refused Recommended Disposition /[] [place] [facility]/ [x]  Follow-up with PCP Additional Notes: Message sent to [facility] with her request for something stronger to be sent in for cough and congestion.

## 2021-05-27 ENCOUNTER — Ambulatory Visit: Payer: Self-pay | Admitting: *Deleted

## 2021-05-27 NOTE — Telephone Encounter (Signed)
Reached back out to patient after reviewing chart, patient was last seen in office 05/12/21 but it was not mentioned in office note that patient reported cough. Advised patient since cough has been present more than 10 days evaluation would be needed to determine if antibiotic is appropriate to treat symptoms. Scheduled visit with PA Erin at 1:20 05/28/21. KW

## 2021-05-27 NOTE — Telephone Encounter (Signed)
°  Chief Complaint: Cough , chest sore from coughing Symptoms: productive cough, yellow greenish, chest sore from coughing "Day and night" Frequency: completed ATB Friday Pertinent Negatives: Patient denies  Disposition: [] ED /[] Urgent Care (no appt availability in office) / [] Appointment(In office/virtual)/ []  [place] [facility]/ [] Home Care/ [] Refused Recommended Disposition /[] [place] [facility]/ [x]  Follow-up with PCP Additional Notes: Pt requesting something stronger for cough "Maybe need more antibiotics." Please advise. CAlled during practice's lunch break.      Reason for Disposition  SEVERE coughing spells (e.g., whooping sound after coughing, vomiting after coughing)  Answer Assessment - Initial Assessment Questions 1. ONSET: "When did the cough begin?"      10 days ago 2. SEVERITY: "How bad is the cough today?"      Severe 3. SPUTUM: "Describe the color of your sputum" (none, dry cough; clear, white, yellow, green)     Yellow and green 4. HEMOPTYSIS: "Are you coughing up any blood?" If so ask: "How much?" (flecks, streaks, tablespoons, etc.)     Nose running, bleeding at times 5. DIFFICULTY BREATHING: "Are you having difficulty breathing?" If Yes, ask: "How bad is it?" (e.g., mild, moderate, severe)    - MILD: No SOB at rest, mild SOB with walking, speaks normally in sentences, can lie down, no retractions, pulse < 100.    - MODERATE: SOB at rest, SOB with minimal exertion and prefers to sit, cannot lie down flat, speaks in phrases, mild retractions, audible wheezing, pulse 100-120.    - SEVERE: Very SOB at rest, speaks in single words, struggling to breathe, sitting hunched forward, retractions, pulse > 120      SOB with coughing, chest sore. SOB "Not any worse." 6. FEVER: "Do you have a fever?" If Yes, ask: "What is your temperature, how was it measured, and when did it start?"     no 7. CARDIAC HISTORY: "Do you have any history of heart disease?" (e.g., heart  attack, congestive heart failure)      *No Answer* 8. LUNG HISTORY: "Do you have any history of lung disease?"  (e.g., pulmonary embolus, asthma, emphysema)     *No Answer* 9. PE RISK FACTORS: "Do you have a history of blood clots?" (or: recent major surgery, recent prolonged travel, bedridden)     *No Answer* 10. OTHER SYMPTOMS: "Do you have any other symptoms?" (e.g., runny nose, wheezing, chest pain)       Wheezing at times  Protocols used: Cough - Acute Productive-A-AH

## 2021-05-28 ENCOUNTER — Encounter: Payer: Self-pay | Admitting: Physician Assistant

## 2021-05-28 ENCOUNTER — Ambulatory Visit (INDEPENDENT_AMBULATORY_CARE_PROVIDER_SITE_OTHER): Payer: Medicare Other | Admitting: Physician Assistant

## 2021-05-28 ENCOUNTER — Other Ambulatory Visit: Payer: Self-pay

## 2021-05-28 ENCOUNTER — Telehealth: Payer: Self-pay

## 2021-05-28 VITALS — BP 126/58 | HR 90 | Temp 98.7°F | Resp 16 | Wt 155.0 lb

## 2021-05-28 DIAGNOSIS — R051 Acute cough: Secondary | ICD-10-CM | POA: Diagnosis not present

## 2021-05-28 DIAGNOSIS — R0602 Shortness of breath: Secondary | ICD-10-CM

## 2021-05-28 MED ORDER — BENZONATATE 100 MG PO CAPS: 100.0000 mg | ORAL_CAPSULE | Freq: Two times a day (BID) | ORAL | 0 refills | Status: DC | PRN

## 2021-05-28 NOTE — Telephone Encounter (Signed)
Patient advised that no rescheduling needed. To go tomorrow and if she needs to come here first so that we can show her where to go for xray.

## 2021-05-28 NOTE — Telephone Encounter (Signed)
Seeing Kelli Gutierrez for acute visit today to reassess

## 2021-05-28 NOTE — Telephone Encounter (Signed)
Copied from [place] [id number]. Topic: Appointment Scheduling - Scheduling Inquiry for Clinic >> May 28, 2021  2:55 PM Valere Dross wrote: Reason for CRM: Pt called in stating the Xray she was supposed to be getting today, she could not find the building, and started feeling exhausted so just went home, and will need someone to call her back to get it rescheduled. Please advise.

## 2021-05-28 NOTE — Progress Notes (Signed)
Established patient visit   Patient: Kelli Gutierrez   DOB: 03/01/1951   71 y.o. Female  MRN: [id number] Visit Date: 05/28/2021  Today's healthcare provider: Dani Gobble Ahmere Hemenway, PA-C  Introduced myself to the patient as a Journalist, newspaper and provided education on APPs in clinical practice.     I,Joseline E Rosas,acting as a scribe for [employer], PA-C.,have documented all relevant documentation on the behalf of [place], PA-C,as directed by  [employer], PA-C while in the presence of Koah Chisenhall E Stevon Gough, PA-C.   Chief Complaint  Patient presents with   Cough   Subjective    HPI  Patient here today with concerns of not feeling better after finishing her antibiotic. Symptoms: lots of cough. Coughing a lot of mucus up. Did a covid test and was negative. States she has a productive cough, with congestion States this has been going on since Jan  She has finished her antibiotic that was provided at the end of Jan She has not been taking anything for symptomatic relief States she has been coughing constantly and has not wanted to leave her house States ear pain has mostly resolved States symptoms have improved since the weekend but she is still coughing a lot of mucus up      Medications: Outpatient Medications Prior to Visit  Medication Sig   aspirin 81 MG chewable tablet Chew 81 mg by mouth daily.   atorvastatin (LIPITOR) 20 MG tablet Take 1 tablet (20 mg total) by mouth daily.   clopidogrel (PLAVIX) 75 MG tablet Take 1 tablet by mouth once daily   empagliflozin (JARDIANCE) 25 MG TABS tablet Take 1 tablet (25 mg total) by mouth daily before breakfast.   glipiZIDE (GLUCOTROL) 5 MG tablet TAKE 1/2 (ONE-HALF) TABLET BY MOUTH ONCE DAILY BEFORE BREAKFAST   glucose blood (CONTOUR NEXT TEST) test strip Use to test blood glucose as directed 1-4 times daily   hydroxychloroquine (PLAQUENIL) 200 MG tablet Take 200 mg by mouth daily.   Lancet Devices MISC Use to test blood glucose as directed 1-4 times  daily   lisinopril (ZESTRIL) 20 MG tablet Take 1 tablet by mouth once daily   metFORMIN (GLUCOPHAGE) 500 MG tablet Take 500 mg by mouth at bedtime.   metoprolol succinate (TOPROL-XL) 25 MG 24 hr tablet Take 1 tablet by mouth once daily   patiromer (VELTASSA) 8.4 g packet Take 1 packet (8.4 g total) by mouth daily.   pregabalin (LYRICA) 25 MG capsule Take 1 capsule by mouth twice daily   sevelamer carbonate (RENVELA) 800 MG tablet Take 1 tablet (800 mg total) by mouth 3 (three) times daily with meals.   Semaglutide (RYBELSUS) 7 MG TABS Take 7 mg by mouth daily. (Patient not taking: Reported on 05/12/2021)   No facility-administered medications prior to visit.    Review of Systems  Constitutional:  Positive for chills and fatigue. Negative for fever.  HENT:  Positive for congestion and rhinorrhea. Negative for ear pain and sore throat.   Respiratory:  Positive for cough, chest tightness and wheezing.   Gastrointestinal:  Negative for diarrhea, nausea and vomiting.  Genitourinary:        States she has some groin itching after taking antibiotics Denies itching near vagina States this is especially prevalent with urination  Musculoskeletal:  Negative for arthralgias and myalgias.  Skin:  Negative for rash.  Neurological:  Positive for light-headedness. Negative for dizziness and headaches.      Objective  BP (!) 126/58 (BP Location: Right Arm, Patient Position: Sitting, Cuff Size: Large)    Pulse 90    Temp 98.7 F (37.1 C) (Oral)    Resp 16    Wt 155 lb (70.3 kg)    SpO2 97%    BMI 28.35 kg/m  {Show previous vital signs (optional):23777}  Physical Exam Vitals reviewed.  Constitutional:      Appearance: Normal appearance.  HENT:     Head: Normocephalic and atraumatic.     Right Ear: Tympanic membrane, ear canal and external ear normal.     Left Ear: Tympanic membrane, ear canal and external ear normal.     Nose: Nose normal. No congestion or rhinorrhea.     Mouth/Throat:      Mouth: Mucous membranes are moist.     Pharynx: Oropharynx is clear. No oropharyngeal exudate or posterior oropharyngeal erythema.  Cardiovascular:     Rate and Rhythm: Normal rate and regular rhythm.     Pulses: Normal pulses.     Heart sounds: Normal heart sounds.  Pulmonary:     Effort: Pulmonary effort is normal.     Breath sounds: Decreased breath sounds present. No wheezing, rhonchi or rales.  Musculoskeletal:     Cervical back: Normal range of motion and neck supple. No tenderness.  Lymphadenopathy:     Cervical: No cervical adenopathy.  Neurological:     General: No focal deficit present.     Mental Status: She is alert and oriented to person, place, and time.  Psychiatric:        Mood and Affect: Mood normal.        Behavior: Behavior normal.        Thought Content: Thought content normal.        Judgment: Judgment normal.      No results found for any visits on 05/28/21.  Assessment & Plan     Problem List Items Addressed This Visit   None Visit Diagnoses     Acute cough    -  Primary Acute and ongoing, recently addressed with Augmentin for suspected sinusitis States she continues to have productive cough even after finishing abx Has not taken anything for symptomatic relief Will provide Tessalon today to assist with cough and recommend OTC multi-symptom relief for further management  Follow up as needed Will order DG chest for pneumonia rule out given associated SOB and continued productive cough Suspect this is lingering from initial infection but will address according to imaging results.    Relevant Medications   benzonatate (TESSALON) 100 MG capsule   Other Relevant Orders   DG Chest 2 View   Shortness of breath       Relevant Orders   DG Chest 2 View        No follow-ups on file.   I, Tela Kotecki E Clair Alfieri, PA-C, have reviewed all documentation for this visit. The documentation on 05/28/21 for the exam, diagnosis, procedures, and orders are all accurate  and complete.   Kaleiah Kutzer, [place]ie [place] MPH [place], PA-C  [facility] [phone] (phone) [phone] (fax)  [place]

## 2021-05-28 NOTE — Patient Instructions (Addendum)
I believe you are recovering from a bacterial infection that was treated previously with antibiotics. Even after the initial infection is gone, symptoms can last for a few days to weeks   Symptoms can last for 3-10 days with lingering cough and intermittent symptoms lasting weeks after that.  The goal of treatment at this time is to reduce your symptoms and discomfort   I have sent in Tessalon pearls for you to take twice per day to help with your cough  You can use over the counter medications such as Dayquil/Nyquil, AlkaSeltzer formulations, etc to provide further relief of symptoms according to the manufacturer's instructions  If preferred you can use Coricidin to manage your symptoms rather than those medications mentioned above.    If your symptoms do not improve or become worse in the next 5-7 days please make an apt at the office so we can see you  Go to the ER if you begin to have more serious symptoms such as shortness of breath, trouble breathing, loss of consciousness, swelling around the eyes, high fever, severe lasting headaches, vision changes or neck pain/stiffness.

## 2021-05-28 NOTE — Telephone Encounter (Signed)
Noted  

## 2021-05-29 ENCOUNTER — Ambulatory Visit
Admission: RE | Admit: 2021-05-29 | Discharge: 2021-05-29 | Disposition: A | Discharge status: Home / Self Care | Payer: Medicare Other | Source: Ambulatory Visit | Attending: Physician Assistant | Admitting: Physician Assistant

## 2021-05-29 ENCOUNTER — Other Ambulatory Visit: Payer: Self-pay | Admitting: Physician Assistant

## 2021-05-29 DIAGNOSIS — R051 Acute cough: Secondary | ICD-10-CM

## 2021-05-29 DIAGNOSIS — R0602 Shortness of breath: Secondary | ICD-10-CM

## 2021-05-29 MED ORDER — AMOXICILLIN-POT CLAVULANATE 875-125 MG PO TABS: 1.0000 | ORAL_TABLET | Freq: Two times a day (BID) | ORAL | 0 refills | Status: DC

## 2021-05-29 MED ORDER — AZITHROMYCIN 250 MG PO TABS: ORAL_TABLET | ORAL | 0 refills | Status: DC

## 2021-06-02 ENCOUNTER — Ambulatory Visit: Payer: Self-pay

## 2021-06-02 ENCOUNTER — Other Ambulatory Visit: Payer: Self-pay

## 2021-06-02 ENCOUNTER — Ambulatory Visit (INDEPENDENT_AMBULATORY_CARE_PROVIDER_SITE_OTHER): Payer: Medicare Other | Admitting: Physician Assistant

## 2021-06-02 ENCOUNTER — Encounter: Payer: Self-pay | Admitting: Physician Assistant

## 2021-06-02 VITALS — BP 104/64 | HR 101 | Temp 97.8°F | Resp 16 | Wt 152.0 lb

## 2021-06-02 DIAGNOSIS — R638 Other symptoms and signs concerning food and fluid intake: Secondary | ICD-10-CM

## 2021-06-02 DIAGNOSIS — J189 Pneumonia, unspecified organism: Secondary | ICD-10-CM

## 2021-06-02 DIAGNOSIS — E1165 Type 2 diabetes mellitus with hyperglycemia: Secondary | ICD-10-CM

## 2021-06-02 LAB — POCT CBG (FASTING - GLUCOSE)-MANUAL ENTRY: Glucose Fasting, POC: 274 mg/dL — AB (ref 70–99)

## 2021-06-02 NOTE — Progress Notes (Signed)
Established patient visit   Patient: Kelli Gutierrez   DOB: 05-23-1950   71 y.o. Female  MRN: [id number] Visit Date: 06/02/2021  Today's healthcare provider: Dani Gobble Makalah Asberry, PA-C  Introduced myself to the patient as a Journalist, newspaper and provided education on APPs in clinical practice.    Chief Complaint  Patient presents with   Cough   Subjective    HPI  Patient here today c/o persistent cough. Patient reports cough is worse at night and was not able to sleep last night. Patient reports shortness of breath, wheezing, and coughing up green phlegm. Patient is taking antibiotic as prescribed has finished azithromycin. Patient also reports poor appetite and fasting blood sugar was 300. Patient reports taking rybelsus 7 mg , jardiance 25mg  dily and Glipizide 5mg . Patient reports she is suppose to take Glipizide 2.5mg  daily however this morning she took 5mg  due to elevated blood sugar. Patient reports abdominal pain with cough.    States she has had some diarrhea over the past few days  Reports she is not eating very well due to cough and is worried about her elevated blood glucose results  She is taking Tessalon pearls but is still having a lot of coughing She states she feels like the cough is mildly improved from a few days ago Reports she is still having productive cough.     Medications: Outpatient Medications Prior to Visit  Medication Sig   amLODipine (NORVASC) 5 MG tablet Take 1 tablet by mouth daily.   amoxicillin-clavulanate (AUGMENTIN) 875-125 MG tablet Take 1 tablet by mouth 2 (two) times daily.   aspirin 81 MG chewable tablet Chew 81 mg by mouth daily.   atorvastatin (LIPITOR) 20 MG tablet Take 1 tablet (20 mg total) by mouth daily.   benzonatate (TESSALON) 100 MG capsule Take 1 capsule (100 mg total) by mouth 2 (two) times daily as needed for cough.   clopidogrel (PLAVIX) 75 MG tablet Take 1 tablet by mouth once daily   empagliflozin (JARDIANCE) 25 MG TABS tablet Take 1 tablet  (25 mg total) by mouth daily before breakfast.   glipiZIDE (GLUCOTROL) 5 MG tablet TAKE 1/2 (ONE-HALF) TABLET BY MOUTH ONCE DAILY BEFORE BREAKFAST   glucose blood (CONTOUR NEXT TEST) test strip Use to test blood glucose as directed 1-4 times daily   hydroxychloroquine (PLAQUENIL) 200 MG tablet Take 200 mg by mouth daily.   Lancet Devices MISC Use to test blood glucose as directed 1-4 times daily   lisinopril (ZESTRIL) 20 MG tablet Take 1 tablet by mouth once daily (Patient taking differently: Take 10 mg by mouth daily.)   metoprolol succinate (TOPROL-XL) 25 MG 24 hr tablet Take 1 tablet by mouth once daily   patiromer (VELTASSA) 8.4 g packet Take 1 packet (8.4 g total) by mouth daily.   pregabalin (LYRICA) 25 MG capsule Take 1 capsule by mouth twice daily   Semaglutide (RYBELSUS) 7 MG TABS Take 7 mg by mouth daily.   sevelamer carbonate (RENVELA) 800 MG tablet Take 1 tablet (800 mg total) by mouth 3 (three) times daily with meals.   azithromycin (ZITHROMAX) 250 MG tablet Take 2 tablets on day 1, then 1 tablet daily on days 2 through 5 (Patient not taking: Reported on 06/02/2021)   metFORMIN (GLUCOPHAGE) 500 MG tablet Take 500 mg by mouth at bedtime. (Patient not taking: Reported on 06/02/2021)   No facility-administered medications prior to visit.    Review of Systems  Constitutional:  Positive for activity  change, appetite change and fatigue.  Respiratory:  Positive for cough, shortness of breath and wheezing.   Cardiovascular:  Negative for chest pain and palpitations.  Gastrointestinal:  Positive for abdominal pain, diarrhea and nausea. Negative for vomiting.      Objective    BP 104/64 (BP Location: Left Arm, Patient Position: Sitting, Cuff Size: Large)    Pulse (!) 101    Temp 97.8 F (36.6 C) (Oral)    Resp 16    Wt 152 lb (68.9 kg)    SpO2 93%    BMI 27.80 kg/m  {Show previous vital signs (optional):23777}  Physical Exam Vitals reviewed.  Constitutional:      General: She is  awake.     Appearance: Normal appearance. She is well-developed, well-groomed and overweight.  HENT:     Head: Normocephalic and atraumatic.  Cardiovascular:     Rate and Rhythm: Regular rhythm. Tachycardia present.     Pulses:          Radial pulses are 1+ on the right side and 1+ on the left side.     Heart sounds: Normal heart sounds.  Pulmonary:     Breath sounds: Decreased air movement present. Examination of the left-upper field reveals decreased breath sounds. Examination of the left-middle field reveals decreased breath sounds. Decreased breath sounds present. No wheezing, rhonchi or rales.  Musculoskeletal:     Right lower leg: No edema.     Left lower leg: No edema.  Lymphadenopathy:     Head:     Right side of head: No submental or submandibular adenopathy.     Left side of head: No submental or submandibular adenopathy.     Upper Body:     Right upper body: No supraclavicular adenopathy.     Left upper body: No supraclavicular adenopathy.  Skin:    General: Skin is warm and dry.     Coloration: Skin is pale.     Comments: Skin has mild tenting and reduced turgor  Neurological:     Mental Status: She is alert.  Psychiatric:        Behavior: Behavior is cooperative.      No results found for any visits on 06/02/21.  Assessment & Plan     Problem List Items Addressed This Visit       Endocrine   Type 2 diabetes mellitus with hyperglycemia, without long-term current use of insulin (HCC) Chronic with acute exacerbation from suspected CAP Recently underwent changes to DM medications from Nephrology and glucose was 300 this AM Reports decreased appetite while being sick along with several days of diarrhea Recommend she go to ED for evaluation and stabilization at this time given severe comorbid conditions  Patient was amenable to going to [facility] ED after discussing clinical reasoning for ED recommendation   Relevant Orders   POCT CBG (Fasting - Glucose)  (Completed)   Other Visit Diagnoses     Community acquired pneumonia, unspecified laterality    -  Primary Acute, new problem Currently treated with Augmentin and Azithromycin since 05-29-2021 after DG chest demonstrated patchy opacity in left lower lobe  Concern for continued SOB, difficulty breathing, decreased spO2, and lingering productive cough after antibiotic regimen was initiated  Concern for further derangements in blood glucose levels and potential dehydration spurred recommendation for ED at this time     Dehydration symptoms     Acute likely secondary to suspected CAP and diabetes mellitus response to acute illness Recommend she go  to ED for evaluation and stabilization given chronic comorbid conditions         No follow-ups on file.   I, Kazuko Clemence E Maryalyce Sanjuan, PA-C, have reviewed all documentation for this visit. The documentation on 06/02/21 for the exam, diagnosis, procedures, and orders are all accurate and complete.   Carlethia Mesquita, [place]ie [place] MPH [place], PA-C  [facility] [phone] (phone) [phone] (fax)  [place]

## 2021-06-02 NOTE — Patient Instructions (Signed)
Based on our visit today I am concerned you may have the following that will need to be evaluated and managed in the ED: Dehydration Elevated blood glucose from your diabetes that is not responding to medications Potential pneumonia that is not resolving with outpatient management   I would like you to go to the ED to be evaluated for this as your chronic conditions can further complicate your prognosis.  Please stay well hydrated as you are able, rest and continue your medications before going to the ED

## 2021-06-02 NOTE — Telephone Encounter (Signed)
° ° °  Chief Complaint: Still coughing and having shortness of breath. Symptoms: Blood sugar this morning 300."The kidney doctor stopped my metformin 2 weeks ago." Has decreased appetite. Seen in office 05/28/21 Frequency: Started last week Pertinent Negatives: Patient denies fever Disposition: [] ED /[] Urgent Care (no appt availability in office) / [] Appointment(In office/virtual)/ []  [place] [facility]/ [] Home Care/ [] Refused Recommended Disposition /[] [place] [facility]/ []  Follow-up with PCP Additional Notes: Feels "a little better, but not well.I'm worried about my blood sugar being so high this morning before I ate." Please advise pt.  Answer Assessment - Initial Assessment Questions 1. ONSET: "When did the cough begin?"      Last week 2. SEVERITY: "How bad is the cough today?"      Severe 3. SPUTUM: "Describe the color of your sputum" (none, dry cough; clear, white, yellow, green)     Green 4. HEMOPTYSIS: "Are you coughing up any blood?" If so ask: "How much?" (flecks, streaks, tablespoons, etc.)     No 5. DIFFICULTY BREATHING: "Are you having difficulty breathing?" If Yes, ask: "How bad is it?" (e.g., mild, moderate, severe)    - MILD: No SOB at rest, mild SOB with walking, speaks normally in sentences, can lie down, no retractions, pulse < 100.    - MODERATE: SOB at rest, SOB with minimal exertion and prefers to sit, cannot lie down flat, speaks in phrases, mild retractions, audible wheezing, pulse 100-120.    - SEVERE: Very SOB at rest, speaks in single words, struggling to breathe, sitting hunched forward, retractions, pulse > 120      Mild-moderate 6. FEVER: "Do you have a fever?" If Yes, ask: "What is your temperature, how was it measured, and when did it start?"     Wheezing 7. CARDIAC HISTORY: "Do you have any history of heart disease?" (e.g., heart attack, congestive heart failure)      No 8. LUNG HISTORY: "Do you have any history of lung disease?"  (e.g., pulmonary  embolus, asthma, emphysema)     No 9. PE RISK FACTORS: "Do you have a history of blood clots?" (or: recent major surgery, recent prolonged travel, bedridden)     No 10. OTHER SYMPTOMS: "Do you have any other symptoms?" (e.g., runny nose, wheezing, chest pain)       Wheezing 11. PREGNANCY: "Is there any chance you are pregnant?" "When was your last menstrual period?"       No 12. TRAVEL: "Have you traveled out of the country in the last month?" (e.g., travel history, exposures)       No  Protocols used: Cough - Acute Productive-A-AH

## 2021-06-02 NOTE — Telephone Encounter (Signed)
Patient advised as below. Will come in today. Does not want to go the er.

## 2021-06-04 ENCOUNTER — Ambulatory Visit: Payer: Self-pay

## 2021-06-04 NOTE — Telephone Encounter (Signed)
° °  Chief Complaint: ED f/up Symptoms: dehydration Frequency: 2 days Pertinent Negatives: NA Disposition: [] ED /[] Urgent Care (no appt availability in office) / [x] Appointment(In office/virtual)/ []  [place] [facility]/ [] Home Care/ [] Refused Recommended Disposition /[] [place] [facility]/ []  Follow-up with PCP Additional Notes: pt was scheduled for ED f/up visit on 06/16/21 with Junie Panning, [place]  Summary: Patient asking to speak to nurse   Patient called in and stated that she was seen at the ER where she was found to be dehydrated given IV and told to call Junie Panning [place] since she saw her on 06/02/21 to let her know what is going on but states that she will wait a few days and then call back if she feel like she need to be seen in the office. But stated that she needed to speak to the nurse. Ph# [phone]      Reason for Disposition  [1] Recent medical visit within 24 hours AND [2] condition / symptoms SAME (unchanged) AND [3] caller has additional questions triager can answer  Answer Assessment - Initial Assessment Questions 1. MAIN CONCERN OR SYMPTOM:  "What is your main concern right now?" "What question do you have?" "What's the main symptom you're worried about?" (e.g., breathing difficulty, cough, fever. pain)     Pt was told to f/up with PCP from ED  2. ONSET: "When did the    start?"     06/02/21 3. BETTER-SAME-WORSE: "Are you getting better, staying the same, or getting worse compared to how you felt at your last visit to the doctor (most recent medical visit)?"     Got worse, went to ED on 06/03/21 4. VISIT DATE: "When were you seen?" (Date)     06/03/21 8. NEXT APPOINTMENT: "Have you scheduled a follow-up appointment with your doctor?"     yes  Protocols used: Recent Medical Visit for Illness Follow-up Call-A-AH

## 2021-06-16 ENCOUNTER — Encounter: Payer: Self-pay | Admitting: Physician Assistant

## 2021-06-16 ENCOUNTER — Ambulatory Visit (INDEPENDENT_AMBULATORY_CARE_PROVIDER_SITE_OTHER): Payer: Medicare Other | Admitting: Physician Assistant

## 2021-06-16 ENCOUNTER — Other Ambulatory Visit: Payer: Self-pay

## 2021-06-16 VITALS — BP 116/72 | HR 76 | Temp 97.6°F | Resp 16 | Wt 152.0 lb

## 2021-06-16 DIAGNOSIS — R051 Acute cough: Secondary | ICD-10-CM | POA: Diagnosis not present

## 2021-06-16 DIAGNOSIS — E1165 Type 2 diabetes mellitus with hyperglycemia: Secondary | ICD-10-CM | POA: Diagnosis not present

## 2021-06-16 DIAGNOSIS — E1169 Type 2 diabetes mellitus with other specified complication: Secondary | ICD-10-CM | POA: Diagnosis not present

## 2021-06-16 DIAGNOSIS — N184 Chronic kidney disease, stage 4 (severe): Secondary | ICD-10-CM | POA: Diagnosis not present

## 2021-06-16 DIAGNOSIS — E785 Hyperlipidemia, unspecified: Secondary | ICD-10-CM

## 2021-06-16 DIAGNOSIS — R638 Other symptoms and signs concerning food and fluid intake: Secondary | ICD-10-CM

## 2021-06-16 NOTE — Assessment & Plan Note (Addendum)
Chronic, ongoing concern ?Patient reports concerns for acutely increased glucose levels ?She is taking Jardiance and Glipizide per nephrology recommendations ?Discussed Glipizide and that she should resume her prescribed dose of 2.5 mg due to kidney function not 5 mg as she has been doing lately ?Her regimen is in a current state of flux due to worsening kidney function and required regimen changes ?She has also been sick which could be contributing to glucose derangements ?Review of previous Nephrology notes reveals she can continue Rybelsus which may provide further control- potential for further optimization/dose increase ?Will recheck A1c, CMP, CBC at this time to further assist with management  ?Recommend follow up with PCP and nephrology to assist with coordination of DM management  ? ? ?

## 2021-06-16 NOTE — Progress Notes (Signed)
?  ? ? ?Established patient visit ? ? ?Patient: Kelli Gutierrez   DOB: 1950/12/11   71 y.o. Female  MRN: [id number] ?Visit Date: 06/16/2021 ? ? ?I,Joseline E Rosas,acting as a scribe for [employer], PA-C.,have documented all relevant documentation on the behalf of [place], PA-C,as directed by  [employer], PA-C while in the presence of Illene Sweeting E Torell Minder, PA-C.  ? ?Today's healthcare provider: Dani Gobble Jerrelle Michelsen, PA-C  ?Introduced myself to the patient as a Journalist, newspaper and provided education on APPs in clinical practice.  ? ? ?Chief Complaint  ?Patient presents with  ? Follow-up  ? ?Subjective  ?  ?HPI  ?Follow up ER visit ? ?Patient was seen in ER for Subacute cough (Primary Dx);  ?Rhinovirus on 06/03/2021. ?Treatment for this included "Rhino/eneterovirus pos on RPP. Cr 2.31, which is mildly above baseline. WBC normal, lactate 2, CXR clear." ?She reports excellent compliance with treatment. ?She reports this condition is Improved. ? ?States cough is improved but still has intermittent episodes ?States she is still fatigued but this is improved ?Reports intermittent nosebleeds when she was acutely ill ? ? ?States she is concerned for her blood glucose levels since she was taken off the Metformin, Januvia,  ?She is still taking Jardiance, glipizide and  ?She is taking Glipizide 5 mg because she is afraid of how high her glucose is running (295-300)  ?She is not taking her glucose levels everyday  ? ? ?-----------------------------------------------------------------------------------------  ? ?Medications: ?Outpatient Medications Prior to Visit  ?Medication Sig  ? amLODipine (NORVASC) 5 MG tablet Take 1 tablet by mouth daily.  ? amoxicillin-clavulanate (AUGMENTIN) 875-125 MG tablet Take 1 tablet by mouth 2 (two) times daily.  ? aspirin 81 MG chewable tablet Chew 81 mg by mouth daily.  ? atorvastatin (LIPITOR) 20 MG tablet Take 1 tablet (20 mg total) by mouth daily.  ? benzonatate (TESSALON) 100 MG capsule Take 1 capsule (100 mg  total) by mouth 2 (two) times daily as needed for cough.  ? clopidogrel (PLAVIX) 75 MG tablet Take 1 tablet by mouth once daily  ? empagliflozin (JARDIANCE) 25 MG TABS tablet Take 1 tablet (25 mg total) by mouth daily before breakfast.  ? glipiZIDE (GLUCOTROL) 5 MG tablet TAKE 1/2 (ONE-HALF) TABLET BY MOUTH ONCE DAILY BEFORE BREAKFAST  ? glucose blood (CONTOUR NEXT TEST) test strip Use to test blood glucose as directed 1-4 times daily  ? hydroxychloroquine (PLAQUENIL) 200 MG tablet Take 200 mg by mouth daily.  ? Lancet Devices MISC Use to test blood glucose as directed 1-4 times daily  ? metoprolol succinate (TOPROL-XL) 25 MG 24 hr tablet Take 1 tablet by mouth once daily  ? patiromer (VELTASSA) 8.4 g packet Take 1 packet (8.4 g total) by mouth daily.  ? pregabalin (LYRICA) 25 MG capsule Take 1 capsule by mouth twice daily  ? Semaglutide (RYBELSUS) 7 MG TABS Take 7 mg by mouth daily.  ? sevelamer carbonate (RENVELA) 800 MG tablet Take 1 tablet (800 mg total) by mouth 3 (three) times daily with meals.  ? ?No facility-administered medications prior to visit.  ? ? ?Review of Systems  ?Constitutional:  Positive for fever.  ?HENT:  Positive for ear pain (intermittent) and nosebleeds (intermittent). Negative for sinus pressure, sinus pain and sore throat.   ?Respiratory:  Positive for cough. Negative for shortness of breath (improved since last visit).   ?Gastrointestinal:  Positive for nausea. Negative for diarrhea and vomiting.  ?Neurological:  Positive for headaches.  ? ? ?  Objective  ?  ?BP 116/72 (BP Location: Left Arm, Patient Position: Sitting, Cuff Size: Normal)   Pulse 76   Temp 97.6 ?F (36.4 ?C) (Oral)   Resp 16   Wt 152 lb (68.9 kg)   BMI 27.80 kg/m?  ? ? ?Physical Exam ?Constitutional:   ?   General: She is awake.  ?   Appearance: Normal appearance. She is well-developed, well-groomed and normal weight.  ?HENT:  ?   Head: Normocephalic and atraumatic.  ?   Mouth/Throat:  ?   Mouth: Mucous membranes are  moist.  ?   Pharynx: No oropharyngeal exudate or posterior oropharyngeal erythema.  ?Eyes:  ?   General: Lids are normal.  ?   Extraocular Movements: Extraocular movements intact.  ?   Conjunctiva/sclera: Conjunctivae normal.  ?   Pupils: Pupils are equal, round, and reactive to light.  ?Cardiovascular:  ?   Rate and Rhythm: Normal rate and regular rhythm.  ?   Pulses:     ?     Radial pulses are 1+ on the right side and 1+ on the left side.  ?   Heart sounds: Normal heart sounds.  ?Pulmonary:  ?   Effort: Pulmonary effort is normal.  ?   Breath sounds: Normal breath sounds and air entry. No decreased air movement. No decreased breath sounds, wheezing, rhonchi or rales.  ?Musculoskeletal:  ?   Right lower leg: No edema.  ?   Left lower leg: No edema.  ?Neurological:  ?   General: No focal deficit present.  ?   Mental Status: She is alert.  ?   Motor: No weakness.  ?   Gait: Gait normal.  ?Psychiatric:     ?   Mood and Affect: Mood normal.     ?   Behavior: Behavior normal. Behavior is cooperative.     ?   Thought Content: Thought content normal.     ?   Judgment: Judgment normal.  ?  ? ?No results found for any visits on 06/16/21. ? Assessment & Plan  ?  ? ?Problem List Items Addressed This Visit   ? ?  ? Endocrine  ? Type 2 diabetes mellitus with hyperglycemia, without long-term current use of insulin (HCC) - Primary  ?  Chronic, ongoing concern ?Patient reports concerns for acutely increased glucose levels ?She is taking Jardiance and Glipizide per nephrology recommendations ?Discussed Glipizide and that she should resume her prescribed dose of 2.5 mg due to kidney function not 5 mg as she has been doing lately ?Her regimen is in a current state of flux due to worsening kidney function and required regimen changes ?She has also been sick which could be contributing to glucose derangements ?Review of previous Nephrology notes reveals she can continue Rybelsus which may provide further control- potential for further  optimization/dose increase ?Will recheck A1c, CMP, CBC at this time to further assist with management  ?Recommend follow up with PCP and nephrology to assist with coordination of DM management  ? ? ?  ?  ? Relevant Orders  ? Comprehensive Metabolic Panel (CMET)  ? HgB A1c  ? Hyperlipidemia associated with type 2 diabetes mellitus ([place])  ?  Recheck labs today as she is fasting and last lab work was 02/2021  ?Results to dictate management or changes as needed ?For now continue medications as prescribed ?  ?  ? Relevant Orders  ? Lipid Profile  ?  ? Genitourinary  ? CKD (chronic kidney disease) stage  4, GFR 15-29 ml/min (HCC)  ? Relevant Orders  ? CBC w/Diff/Platelet  ? ?Other Visit Diagnoses   ? ? Acute cough     ?Appears to be improving since previous visit ?She reports improved breathing and reduced cough since going to ED and resting ?Follow up as needed ? ?  ? Dehydration symptoms     ?Appears to be improved since previous visit and after discharge from ED ?Recommend she continue to optimize hydration as she is able ?Will check CMP and CBC today  ?  ? ?  ? ? ? ?No follow-ups on file. ? ? ?I, Avantika Shere E Atharv Barriere, PA-C, have reviewed all documentation for this visit. The documentation on 06/16/21 for the exam, diagnosis, procedures, and orders are all accurate and complete. ? ? ?Kalil Woessner, [place]ie [place] MPH ?[place] ?[facility] ? ? ?No follow-ups on file.  ?   ? ? ? ? ?Karrin Eisenmenger E Neva Ramaswamy, PA-C  ?[place] ?[phone] (phone) ?[phone] (fax) ? ?[facility]  ?

## 2021-06-16 NOTE — Assessment & Plan Note (Signed)
Recheck labs today as she is fasting and last lab work was 02/2021  ?Results to dictate management or changes as needed ?For now continue medications as prescribed ?

## 2021-06-17 LAB — COMPREHENSIVE METABOLIC PANEL
ALT: 22 IU/L (ref 0–32)
AST: 28 IU/L (ref 0–40)
Albumin/Globulin Ratio: 1.2 (ref 1.2–2.2)
Albumin: 4.3 g/dL (ref 3.7–4.7)
Alkaline Phosphatase: 129 IU/L — ABNORMAL HIGH (ref 44–121)
BUN/Creatinine Ratio: 22 (ref 12–28)
BUN: 48 mg/dL — ABNORMAL HIGH (ref 8–27)
Bilirubin Total: 0.8 mg/dL (ref 0.0–1.2)
CO2: 16 mmol/L — ABNORMAL LOW (ref 20–29)
Calcium: 9.9 mg/dL (ref 8.7–10.3)
Chloride: 110 mmol/L — ABNORMAL HIGH (ref 96–106)
Creatinine, Ser: 2.23 mg/dL — ABNORMAL HIGH (ref 0.57–1.00)
Globulin, Total: 3.7 g/dL (ref 1.5–4.5)
Glucose: 150 mg/dL — ABNORMAL HIGH (ref 70–99)
Potassium: 5.6 mmol/L — ABNORMAL HIGH (ref 3.5–5.2)
Sodium: 139 mmol/L (ref 134–144)
Total Protein: 8 g/dL (ref 6.0–8.5)
eGFR: 23 mL/min/{1.73_m2} — ABNORMAL LOW (ref >59)

## 2021-06-17 LAB — CBC WITH DIFFERENTIAL/PLATELET
Basophils Absolute: 0.1 10*3/uL (ref 0.0–0.2)
Basos: 1 %
EOS (ABSOLUTE): 0.3 10*3/uL (ref 0.0–0.4)
Eos: 4 %
Hematocrit: 36.1 % (ref 34.0–46.6)
Hemoglobin: 11.6 g/dL (ref 11.1–15.9)
Immature Grans (Abs): 0 10*3/uL (ref 0.0–0.1)
Immature Granulocytes: 0 %
Lymphocytes Absolute: 1.9 10*3/uL (ref 0.7–3.1)
Lymphs: 25 %
MCH: 28.2 pg (ref 26.6–33.0)
MCHC: 32.1 g/dL (ref 31.5–35.7)
MCV: 88 fL (ref 79–97)
Monocytes Absolute: 0.4 10*3/uL (ref 0.1–0.9)
Monocytes: 6 %
Neutrophils Absolute: 4.9 10*3/uL (ref 1.4–7.0)
Neutrophils: 64 %
Platelets: 264 10*3/uL (ref 150–450)
RBC: 4.12 x10E6/uL (ref 3.77–5.28)
RDW: 12.5 % (ref 11.7–15.4)
WBC: 7.5 10*3/uL (ref 3.4–10.8)

## 2021-06-17 LAB — LIPID PANEL
Chol/HDL Ratio: 1.9 ratio (ref 0.0–4.4)
Cholesterol, Total: 92 mg/dL — ABNORMAL LOW (ref 100–199)
HDL: 48 mg/dL (ref >39)
LDL Chol Calc (NIH): 28 mg/dL (ref 0–99)
Triglycerides: 80 mg/dL (ref 0–149)
VLDL Cholesterol Cal: 16 mg/dL (ref 5–40)

## 2021-06-17 LAB — HEMOGLOBIN A1C
Est. average glucose Bld gHb Est-mCnc: 229 mg/dL
Hgb A1c MFr Bld: 9.6 % — ABNORMAL HIGH (ref 4.8–5.6)

## 2021-06-24 NOTE — Progress Notes (Signed)
?  ? ?I,Sulibeya S Dimas,acting as a scribe for Lavon Paganini, MD.,have documented all relevant documentation on the behalf of Lavon Paganini, MD,as directed by  Lavon Paganini, MD while in the presence of Lavon Paganini, MD. ? ? ?Established patient visit ? ? ?Patient: Kelli Gutierrez   DOB: June 24, 1950   71 y.o. Female  MRN: [id number] ?Visit Date: 06/25/2021 ? ?Today's healthcare provider: Lavon Paganini, MD  ? ?Chief Complaint  ?Patient presents with  ? Follow-up  ? ?Subjective  ?  ?HPI  ?Patient following up on labs results and was recommend discussion with nephrology and PCP to determine best option for reducing blood glucose and protecting kidney health.  ? ?Lab Results  ?Component Value Date  ? HGBA1C 9.6 (H) 06/16/2021  ? HGBA1C 7.7 12/26/2020  ? HGBA1C 7.2 (A) 06/24/2020  ? ?She does not have Rybelsus at home and knows that she never got this medication. ? ? ?Medications: ?Outpatient Medications Prior to Visit  ?Medication Sig  ? amLODipine (NORVASC) 5 MG tablet Take 1 tablet by mouth daily.  ? aspirin 81 MG chewable tablet Chew 81 mg by mouth daily.  ? atorvastatin (LIPITOR) 20 MG tablet Take 1 tablet (20 mg total) by mouth daily.  ? clopidogrel (PLAVIX) 75 MG tablet Take 1 tablet by mouth once daily  ? empagliflozin (JARDIANCE) 25 MG TABS tablet Take 1 tablet (25 mg total) by mouth daily before breakfast.  ? glipiZIDE (GLUCOTROL) 5 MG tablet TAKE 1/2 (ONE-HALF) TABLET BY MOUTH ONCE DAILY BEFORE BREAKFAST  ? glucose blood (CONTOUR NEXT TEST) test strip Use to test blood glucose as directed 1-4 times daily  ? hydroxychloroquine (PLAQUENIL) 200 MG tablet Take 200 mg by mouth daily.  ? Lancet Devices MISC Use to test blood glucose as directed 1-4 times daily  ? metoprolol succinate (TOPROL-XL) 25 MG 24 hr tablet Take 1 tablet by mouth once daily  ? patiromer (VELTASSA) 8.4 g packet Take 1 packet (8.4 g total) by mouth daily.  ? pregabalin (LYRICA) 25 MG capsule Take 1 capsule by mouth twice daily   ? sevelamer carbonate (RENVELA) 800 MG tablet Take 1 tablet (800 mg total) by mouth 3 (three) times daily with meals.  ? [DISCONTINUED] Semaglutide (RYBELSUS) 7 MG TABS Take 7 mg by mouth daily.  ? [DISCONTINUED] amoxicillin-clavulanate (AUGMENTIN) 875-125 MG tablet Take 1 tablet by mouth 2 (two) times daily. (Patient not taking: Reported on 06/25/2021)  ? [DISCONTINUED] benzonatate (TESSALON) 100 MG capsule Take 1 capsule (100 mg total) by mouth 2 (two) times daily as needed for cough. (Patient not taking: Reported on 06/25/2021)  ? ?No facility-administered medications prior to visit.  ? ? ?Review of Systems  ?Constitutional:  Positive for activity change, appetite change, fatigue and unexpected weight change.  ?Respiratory:  Negative for cough.   ?Cardiovascular:  Negative for chest pain and leg swelling.  ?Gastrointestinal:  Positive for nausea. Negative for abdominal pain, diarrhea and vomiting.  ?Psychiatric/Behavioral:  Positive for sleep disturbance.   ? ?Last CBC ?Lab Results  ?Component Value Date  ? WBC 7.5 06/16/2021  ? HGB 11.6 06/16/2021  ? HCT 36.1 06/16/2021  ? MCV 88 06/16/2021  ? MCH 28.2 06/16/2021  ? RDW 12.5 06/16/2021  ? PLT 264 06/16/2021  ? ?Last metabolic panel ?Lab Results  ?Component Value Date  ? GLUCOSE 150 (H) 06/16/2021  ? NA 139 06/16/2021  ? K 5.6 (H) 06/16/2021  ? CL 110 (H) 06/16/2021  ? CO2 16 (L) 06/16/2021  ? BUN 48 (  H) 06/16/2021  ? CREATININE 2.23 (H) 06/16/2021  ? EGFR 23 (L) 06/16/2021  ? CALCIUM 9.9 06/16/2021  ? PROT 8.0 06/16/2021  ? ALBUMIN 4.3 06/16/2021  ? LABGLOB 3.7 06/16/2021  ? AGRATIO 1.2 06/16/2021  ? BILITOT 0.8 06/16/2021  ? ALKPHOS 129 (H) 06/16/2021  ? AST 28 06/16/2021  ? ALT 22 06/16/2021  ? ANIONGAP 8 02/03/2019  ? ?Last lipids ?Lab Results  ?Component Value Date  ? CHOL 92 (L) 06/16/2021  ? HDL 48 06/16/2021  ? [place] 28 06/16/2021  ? TRIG 80 06/16/2021  ? CHOLHDL 1.9 06/16/2021  ? ?Last hemoglobin A1c ?Lab Results  ?Component Value Date  ? HGBA1C 9.6 (H)  06/16/2021  ? ?  ?  Objective  ?  ?BP 120/78 (BP Location: Left Arm, Cuff Size: Normal)   Pulse 71   Temp 98 ?F (36.7 ?C) (Temporal)   Resp 16   Wt 150 lb (68 kg)   SpO2 99%   BMI 27.44 kg/m?  ?Today's Vitals  ? 06/25/21 1046 06/25/21 1048  ?BP: (!) 136/92 120/78  ?Pulse: 75 71  ?Resp: 16   ?Temp: 98 ?F (36.7 ?C)   ?TempSrc: Temporal   ?SpO2: 99%   ?Weight: 150 lb (68 kg)   ? ?Body mass index is 27.44 kg/m?. ? ? ?Physical Exam ?Vitals reviewed.  ?Constitutional:   ?   General: She is not in acute distress. ?   Appearance: Normal appearance. She is well-developed. She is not diaphoretic.  ?HENT:  ?   Head: Normocephalic and atraumatic.  ?Eyes:  ?   General: No scleral icterus. ?   Conjunctiva/sclera: Conjunctivae normal.  ?Neck:  ?   Thyroid: No thyromegaly.  ?Cardiovascular:  ?   Rate and Rhythm: Normal rate and regular rhythm.  ?   Pulses: Normal pulses.  ?   Heart sounds: Normal heart sounds. No murmur heard. ?Pulmonary:  ?   Effort: Pulmonary effort is normal. No respiratory distress.  ?   Breath sounds: Normal breath sounds. No wheezing, rhonchi or rales.  ?Musculoskeletal:  ?   Cervical back: Neck supple.  ?   Right lower leg: No edema.  ?   Left lower leg: No edema.  ?Lymphadenopathy:  ?   Cervical: No cervical adenopathy.  ?Skin: ?   General: Skin is warm and dry.  ?   Findings: No rash.  ?Neurological:  ?   Mental Status: She is alert and oriented to person, place, and time. Mental status is at baseline.  ?Psychiatric:     ?   Mood and Affect: Mood normal.     ?   Behavior: Behavior normal.  ?  ? ? ?No results found for any visits on 06/25/21. ? Assessment & Plan  ?  ? ?Problem List Items Addressed This Visit   ? ?  ? Cardiovascular and Mediastinum  ? Hypertension associated with diabetes ([place])  ?  Well controlled ?Continue current medications ?Reviewed recent metabolic panel ?  ?  ? Relevant Medications  ? Semaglutide (RYBELSUS) 7 MG TABS  ?  ? Endocrine  ? Type 2 diabetes mellitus with hyperglycemia,  without long-term current use of insulin (HCC) - Primary  ?  Uncontrolled with hyperglycemia ?Reviewed last A1c ?Seems that patient does not have her current meds at home to take ?Sample of rybelsus given today and refill sent to pharmacy ?Continue glipizide and Jardiance at current dose ?Low GFR inhibits ability to use most oral DM meds ?Patient with needle phobia ?uACR today ? ?  ?  ?  Relevant Medications  ? Semaglutide (RYBELSUS) 7 MG TABS  ? Other Relevant Orders  ? Urine Microalbumin w/creat. ratio  ?  ? Genitourinary  ? CKD (chronic kidney disease) stage 4, GFR 15-29 ml/min (HCC)  ?  Chronic, monitored by Nephrology ?Renally dose meds ?  ?  ?  ? ?Return in about 4 weeks (around 07/23/2021) for diabetes f/u.  ?   ? ?I, Lavon Paganini, MD, have reviewed all documentation for this visit. The documentation on 06/25/21 for the exam, diagnosis, procedures, and orders are all accurate and complete. ? ? ?Virginia Crews, MD, MPH ?[place] ?[facility]   ?

## 2021-06-25 ENCOUNTER — Encounter: Payer: Self-pay | Admitting: Family Medicine

## 2021-06-25 ENCOUNTER — Ambulatory Visit (INDEPENDENT_AMBULATORY_CARE_PROVIDER_SITE_OTHER): Payer: Medicare Other | Admitting: Family Medicine

## 2021-06-25 ENCOUNTER — Other Ambulatory Visit: Payer: Self-pay

## 2021-06-25 VITALS — BP 120/78 | HR 71 | Temp 98.0°F | Resp 16 | Wt 150.0 lb

## 2021-06-25 DIAGNOSIS — E1165 Type 2 diabetes mellitus with hyperglycemia: Secondary | ICD-10-CM | POA: Diagnosis not present

## 2021-06-25 DIAGNOSIS — N184 Chronic kidney disease, stage 4 (severe): Secondary | ICD-10-CM | POA: Diagnosis not present

## 2021-06-25 DIAGNOSIS — I152 Hypertension secondary to endocrine disorders: Secondary | ICD-10-CM | POA: Diagnosis not present

## 2021-06-25 DIAGNOSIS — E1159 Type 2 diabetes mellitus with other circulatory complications: Secondary | ICD-10-CM | POA: Diagnosis not present

## 2021-06-25 MED ORDER — RYBELSUS 7 MG PO TABS: 7.0000 mg | ORAL_TABLET | Freq: Every day | ORAL | 5 refills | Status: DC

## 2021-06-25 NOTE — Assessment & Plan Note (Signed)
Well controlled Continue current medications Reviewed recent metabolic panel 

## 2021-06-25 NOTE — Assessment & Plan Note (Signed)
Uncontrolled with hyperglycemia ?Reviewed last A1c ?Seems that patient does not have her current meds at home to take ?Sample of rybelsus given today and refill sent to pharmacy ?Continue glipizide and Jardiance at current dose ?Low GFR inhibits ability to use most oral DM meds ?Patient with needle phobia ?uACR today ? ?

## 2021-06-25 NOTE — Assessment & Plan Note (Signed)
Chronic, monitored by Nephrology ?Renally dose meds ?

## 2021-06-26 LAB — MICROALBUMIN / CREATININE URINE RATIO
Creatinine, Urine: 50.7 mg/dL
Microalb/Creat Ratio: 135 mg/g creat — ABNORMAL HIGH (ref 0–29)
Microalbumin, Urine: 68.4 ug/mL

## 2021-07-06 ENCOUNTER — Other Ambulatory Visit: Payer: Self-pay | Admitting: Family Medicine

## 2021-07-06 DIAGNOSIS — E785 Hyperlipidemia, unspecified: Secondary | ICD-10-CM

## 2021-07-16 ENCOUNTER — Encounter: Payer: Self-pay | Admitting: Surgery

## 2021-07-16 ENCOUNTER — Ambulatory Visit (INDEPENDENT_AMBULATORY_CARE_PROVIDER_SITE_OTHER): Payer: Medicare Other | Admitting: Surgery

## 2021-07-16 VITALS — BP 113/70 | HR 82 | Temp 98.0°F | Ht 62.0 in | Wt 151.0 lb

## 2021-07-16 DIAGNOSIS — R2231 Localized swelling, mass and lump, right upper limb: Secondary | ICD-10-CM | POA: Diagnosis not present

## 2021-07-16 NOTE — Progress Notes (Signed)
Patient ID: Kelli Gutierrez, female   DOB: Sep 10, 1950, 71 y.o.   MRN: [id number] ? ?Chief Complaint: Subcutaneous lesion right volar forearm ? ?History of Present Illness ?She returns today at my request for follow-up.  She reports there may be some enlargement of these nodules which seem to be progressively more mobile but more proximal.  Potential fixation may be due to a possible ganglion.  They remain nontender, and she reports that she remains pain-free.  She does not mind the look.   ?Previously: ?Kelli Gutierrez is a 71 y.o. female with with history of RA, she seems to have utilize some form of compressive wrap about 2 months ago with subsequent development of a subcutaneous lesion in that area.  She denies any pain, discharge, inflammatory changes, skin color changes etc. in this area.  She presents to have it evaluated, does not feel any particular need to have it excised. ? ?Past Medical History ?Past Medical History:  ?Diagnosis Date  ? Acute CVA (cerebrovascular accident) ([place]) 12/17/2014  ? Anemia   ? Breast pain, right 06/01/2015  ? Chronic kidney disease 12/2015  ? CVA (cerebral infarction) 12/17/2014  ? Diabetes mellitus without complication ([place])   ? GERD (gastroesophageal reflux disease)   ? Hyperlipidemia   ? Hypertension   ? Lumbar radiculitis 12/06/2013  ? Meningitis   ? Rheumatoid arthritis ([place])   ? Stroke [facility])   ?  ? ? ?Past Surgical History:  ?Procedure Laterality Date  ? APPENDECTOMY    ? CESAREAN SECTION    ? 4  ? CHOLECYSTECTOMY    ? MIDDLE EAR SURGERY    ? ? ?Allergies  ?Allergen Reactions  ? Hydrocodone   ?  Other reaction(s): Dizziness  ? ? ?Current Outpatient Medications  ?Medication Sig Dispense Refill  ? amLODipine (NORVASC) 5 MG tablet Take 1 tablet by mouth daily.    ? aspirin 81 MG chewable tablet Chew 81 mg by mouth daily.    ? atorvastatin (LIPITOR) 20 MG tablet Take 1 tablet by mouth once daily 90 tablet 0  ? clopidogrel (PLAVIX) 75 MG tablet Take 1 tablet by mouth once daily 90 tablet 0  ?  empagliflozin (JARDIANCE) 25 MG TABS tablet Take 1 tablet (25 mg total) by mouth daily before breakfast. 90 tablet 1  ? glipiZIDE (GLUCOTROL) 5 MG tablet TAKE 1/2 (ONE-HALF) TABLET BY MOUTH ONCE DAILY BEFORE BREAKFAST 45 tablet 0  ? glucose blood (CONTOUR NEXT TEST) test strip Use to test blood glucose as directed 1-4 times daily 100 each 12  ? hydroxychloroquine (PLAQUENIL) 200 MG tablet Take 200 mg by mouth daily.    ? Lancet Devices MISC Use to test blood glucose as directed 1-4 times daily 100 each 3  ? metoprolol succinate (TOPROL-XL) 25 MG 24 hr tablet Take 1 tablet by mouth once daily 90 tablet 1  ? patiromer (VELTASSA) 8.4 g packet Take 1 packet (8.4 g total) by mouth daily. 30 each 5  ? pregabalin (LYRICA) 25 MG capsule Take 1 capsule by mouth twice daily 60 capsule 0  ? Semaglutide (RYBELSUS) 7 MG TABS Take 7 mg by mouth daily. 30 tablet 5  ? sevelamer carbonate (RENVELA) 800 MG tablet Take 1 tablet (800 mg total) by mouth 3 (three) times daily with meals. 90 tablet 11  ? ?No current facility-administered medications for this visit.  ? ? ?Family History ?Family History  ?Problem Relation Age of Onset  ? Leukemia Mother   ?     unsure  of conditions, was "healthy, old lady"  ? Stroke Father   ? Diabetes Father   ? Diabetes Sister   ? Breast cancer Neg Hx   ? Colon cancer Neg Hx   ?  ? ? ?Social History ?Social History  ? ?Tobacco Use  ? Smoking status: Never  ? Smokeless tobacco: Never  ?Vaping Use  ? Vaping Use: Never used  ?Substance Use Topics  ? Alcohol use: No  ? Drug use: Never  ?  ?  ? ? ?Review of Systems  ?Constitutional: Negative.   ?HENT:  Positive for hearing loss and tinnitus.   ?Eyes:  Positive for blurred vision.  ?Respiratory: Negative.    ?Cardiovascular:  Positive for orthopnea and leg swelling.  ?Gastrointestinal:  Positive for constipation.  ?Genitourinary: Negative.   ?Skin:  Positive for itching.  ?Neurological: Negative.   ?Psychiatric/Behavioral:  Positive for depression.   ?   ? ?Physical Exam ?Blood pressure 113/70, pulse 82, temperature 98 ?F (36.7 ?C), height 5\' 2"  (1.575 m), weight 151 lb (68.5 kg), SpO2 100 %. ?Last Weight  Most recent update: 07/16/2021  8:54 AM  ? ? Weight  ?68.5 kg (151 lb)  ?      ? ?  ? ? ?CONSTITUTIONAL: Well developed, and nourished, appropriately responsive and aware without distress.   ?EYES: Sclera non-icteric.   ?EARS, NOSE, MOUTH AND THROAT: Mask worn.    Hearing is intact to voice.  ?NECK: Trachea is midline, and there is no jugular venous distension.  ?LYMPH NODES:  Lymph nodes in the neck are not enlarged. ?RESPIRATORY:  Lungs are clear, and breath sounds are equal bilaterally. Normal respiratory effort without pathologic use of accessory muscles. ?CARDIOVASCULAR: Heart is regular in rate and rhythm. ?GI: The abdomen is soft, nontender, and nondistended.  ?MUSCULOSKELETAL:  Symmetrical muscle tone appreciated in all four extremities.    ?SKIN: Skin turgor is normal. No pathologic skin lesions appreciated.   The more proximal aspect of the lesion as noted in the photo below appears to be contiguous with the serpiginous density that is likely a thrombosed superficial vein.  It is quite mobile, and with the patient's thin skin there readily apparent to palpation being just under 2.5 cm in length, and about a centimeter in width.  I am concerned with the paucity of underlying soft tissues we may expose some vulnerable tendinous structures that excision may do more harm than good. ?The picture below is from her prior visit, lesions, though similar appears somewhat more prominent today. ? ? ?NEUROLOGIC:  Motor and sensation appear grossly normal.  Cranial nerves are grossly without defect. ?PSYCH:  Alert and oriented to person, place and time. Affect is appropriate for situation. ? ?Data Reviewed ?I have personally reviewed what is currently available of the patient's imaging, recent labs and medical records.   ?Labs:  ? ?  Latest Ref Rng & Units 06/16/2021  ?  10:51 AM 10/09/2020  ? 10:56 AM 05/07/2019  ?  3:36 PM  ?CBC  ?WBC 3.4 - 10.8 x10E3/uL 7.5   5.4   7.3    ?Hemoglobin 11.1 - 15.9 g/dL 11.6   11.1   11.8    ?Hematocrit 34.0 - 46.6 % 36.1   35.9   35.8    ?Platelets 150 - 450 x10E3/uL 264   193   238    ? ? ?  Latest Ref Rng & Units 06/16/2021  ? 10:51 AM 02/17/2021  ?  8:54 AM 03/13/2020  ?  9:42 AM  ?CMP  ?Glucose 70 - 99 mg/dL 150    143    ?BUN 8 - 27 mg/dL 48    45    ?Creatinine 0.57 - 1.00 mg/dL 2.23    2.22    ?Sodium 134 - 144 mmol/L 139    139    ?Potassium 3.5 - 5.2 mmol/L 5.6    5.7    ?Chloride 96 - 106 mmol/L 110    107    ?CO2 20 - 29 mmol/L 16    18    ?Calcium 8.7 - 10.3 mg/dL 9.9    9.1    ?Total Protein 6.0 - 8.5 g/dL 8.0   8.2   7.6    ?Total Bilirubin 0.0 - 1.2 mg/dL 0.8   0.6   0.4    ?Alkaline Phos 44 - 121 IU/L 129   139   115    ?AST 0 - 40 IU/L 28   18   37    ?ALT 0 - 32 IU/L 22   19   50    ? ? ? ? ?Imaging: ? ?Within last 24 hrs: No results found. ? ?Assessment ?   ?Lesion right forearm, consistent with benign etiology, suspect related to possible complications of wrist ganglion, due to apparent fixed nature distally. ?Patient Active Problem List  ? Diagnosis Date Noted  ? Subcutaneous mass of right forearm 05/19/2021  ? Mass of soft tissue of wrist 05/12/2021  ? Overweight 10/09/2020  ? Diabetic peripheral neuropathy ([place]) 10/09/2020  ? Dyspnea on exertion 05/31/2019  ? Dizziness 05/23/2019  ? COPD, mild ([place]) 05/08/2019  ? 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 Anemia in stage 4 chronic kidney disease ([place]) 05/08/2019  ? Current moderate episode of major depressive disorder without prior episode ([place]) 05/07/2019  ? Neuropathic pain 09/14/2016  ? 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 Vitamin D deficiency 06/23/2016  ? CKD (chronic kidney disease) stage 4, GFR 15-29 ml/min (HCC) 03/23/2016  ? Type 2 diabetes  mellitus with hyperglycemia, without long-term current use of insulin ([place]) 03/18/2016  ? Rheumatoid arthritis 03/18/2016  ? Current use of long term anticoagulation (Plavix) 03/18/2016  ? Hyperlipidemia ass

## 2021-07-16 NOTE — Patient Instructions (Addendum)
A referral has been placed to the [facility] in [place]. They will call you with an appointment. ? ? ?If you have any concerns or questions, please feel free to call our office.  ? ?Ganglion Cyst ?A ganglion cyst is a non-cancerous, fluid-filled lump of tissue that occurs near a joint, tendon, or ligament. The cyst grows out of a joint or the lining of a tendon or ligament. Ganglion cysts most often develop in the hand or wrist, but they can also develop in the shoulder, elbow, hip, knee, ankle, or foot. ?Ganglion cysts are ball-shaped or egg-shaped. Their size can range from the size of a pea to larger than a grape. Increased activity may cause the cyst to get bigger because more fluid starts to build up. ?What are the causes? ?The exact cause of this condition is not known, but it may be related to: ?Inflammation or irritation around the joint. ?An injury or tear in the layers of tissue around the joint (joint capsule). ?Repetitive movements or overuse. ?History of acute or repeated injury. ?What increases the risk? ?You are more likely to develop this condition if: ?You are a female. ?You are 24-27 years old. ?What are the signs or symptoms? ?The main symptom of this condition is a lump. It most often appears on the hand or wrist. In many cases, there are no other symptoms, but a cyst can sometimes cause: ?Tingling. ?Pain or tenderness. ?Numbness. ?Weakness or loss of strength in the affected joint. ?Decreased range of motion in the affected area of the body. ?How is this diagnosed? ?Ganglion cysts are usually diagnosed based on a physical exam. Your health care provider will feel the lump and may shine a light next to it. If it is a ganglion cyst, the light will likely shine through it. ?Your health care provider may order an X-ray, ultrasound, MRI, or CT scan to rule out other conditions. ?How is this treated? ?Ganglion cysts often go away on their own without treatment. If you have pain or other symptoms,  treatment may be needed. Treatment is also needed if the ganglion cyst limits your movement or if it gets infected. Treatment may include: ?Wearing a brace or splint on your wrist or finger. ?Taking anti-inflammatory medicine. ?Having fluid drained from the lump with a needle (aspiration). ?Getting an injection of medicine into the joint to decrease inflammation. This may be corticosteroids, ethanol, or hyaluronidase. ?Having surgery to remove the ganglion cyst. ?Placing a pad in your shoe or wearing shoes that will not rub against the cyst if it is on your foot. ?Follow these instructions at home: ?Do not press on the ganglion cyst, poke it with a needle, or hit it. ?Take over-the-counter and prescription medicines only as told by your health care provider. ?If you have a brace or splint: ?Wear it as told by your health care provider. ?Remove it as told by your health care provider. Ask if you need to remove it when you take a shower or a bath. ?Watch your ganglion cyst for any changes. ?Keep all follow-up visits as told by your health care provider. This is important. ?Contact a health care provider if: ?Your ganglion cyst becomes larger or more painful. ?You have pus coming from the lump. ?You have weakness or numbness in the affected area. ?You have a fever or chills. ?Get help right away if: ?You have a fever and have any of these in the cyst area: ?Increased redness. ?Red streaks. ?Swelling. ?Summary ?A ganglion cyst is  a non-cancerous, fluid-filled lump that occurs near a joint, tendon, or ligament. ?Ganglion cysts most often develop in the hand or wrist, but they can also develop in the shoulder, elbow, hip, knee, ankle, or foot. ?Ganglion cysts often go away on their own without treatment. ?This information is not intended to replace advice given to you by your health care provider. Make sure you discuss any questions you have with your health care provider. ?Document Revised: 06/20/2019 Document Reviewed:  06/20/2019 ?Elsevier Patient Education ? [place]. ? ?

## 2021-07-22 NOTE — Progress Notes (Signed)
? ?I,Sulibeya S Dimas,acting as a scribe for Lavon Paganini, MD.,have documented all relevant documentation on the behalf of Lavon Paganini, MD,as directed by  Lavon Paganini, MD while in the presence of Lavon Paganini, MD. ?  ? ? ?Established patient visit ? ? ?Patient: Kelli Gutierrez   DOB: Jul 31, 1950   71 y.o. Female  MRN: [id number] ?Visit Date: 07/23/2021 ? ?Today's healthcare provider: Lavon Paganini, MD  ? ?Chief Complaint  ?Patient presents with  ? Diabetes  ? ?Subjective  ?  ?HPI  ?Follow up for type 2 diabetes mellitus ? ?The patient was last seen for this 1 months ago. ?Changes made at last visit include sample of rybelsus 78m daily given, continue glipizide 2.576mdaily before breakfast and Jardiance 2576maily before breakfast. ? ?She reports excellent compliance with treatment. ?She feels that condition is Unchanged. ?She is not having side effects.  ?She reports fbs 199, 200 ? ?Lab Results  ?Component Value Date  ? HGBA1C 9.6 (H) 06/16/2021  ? ? ?----------------------------------------------------------------------------------------- ? ? ?Medications: ?Outpatient Medications Prior to Visit  ?Medication Sig  ? amLODipine (NORVASC) 5 MG tablet Take 1 tablet by mouth daily.  ? aspirin 81 MG chewable tablet Chew 81 mg by mouth daily.  ? atorvastatin (LIPITOR) 20 MG tablet Take 1 tablet by mouth once daily  ? clopidogrel (PLAVIX) 75 MG tablet Take 1 tablet by mouth once daily  ? empagliflozin (JARDIANCE) 25 MG TABS tablet Take 1 tablet (25 mg total) by mouth daily before breakfast.  ? glipiZIDE (GLUCOTROL) 5 MG tablet TAKE 1/2 (ONE-HALF) TABLET BY MOUTH ONCE DAILY BEFORE BREAKFAST  ? glucose blood (CONTOUR NEXT TEST) test strip Use to test blood glucose as directed 1-4 times daily  ? hydroxychloroquine (PLAQUENIL) 200 MG tablet Take 200 mg by mouth daily.  ? [employer] MISC Use to test blood glucose as directed 1-4 times daily  ? metoprolol succinate (TOPROL-XL) 25 MG 24 hr tablet Take 1  tablet by mouth once daily  ? patiromer (VELTASSA) 8.4 g packet Take 1 packet (8.4 g total) by mouth daily.  ? pregabalin (LYRICA) 25 MG capsule Take 1 capsule by mouth twice daily  ? sevelamer carbonate (RENVELA) 800 MG tablet Take 1 tablet (800 mg total) by mouth 3 (three) times daily with meals.  ? [DISCONTINUED] Semaglutide (RYBELSUS) 7 MG TABS Take 7 mg by mouth daily.  ? ?No facility-administered medications prior to visit.  ? ? ?Review of Systems  ?Constitutional:  Positive for fatigue. Negative for appetite change and unexpected weight change.  ?Respiratory:  Positive for shortness of breath. Negative for chest tightness.   ?Cardiovascular:  Negative for chest pain.  ?Gastrointestinal:  Positive for nausea. Negative for abdominal pain and vomiting.  ? ?Last metabolic panel ?Lab Results  ?Component Value Date  ? GLUCOSE 150 (H) 06/16/2021  ? NA 139 06/16/2021  ? K 5.6 (H) 06/16/2021  ? CL 110 (H) 06/16/2021  ? CO2 16 (L) 06/16/2021  ? BUN 48 (H) 06/16/2021  ? CREATININE 2.23 (H) 06/16/2021  ? EGFR 23 (L) 06/16/2021  ? CALCIUM 9.9 06/16/2021  ? PROT 8.0 06/16/2021  ? ALBUMIN 4.3 06/16/2021  ? LABGLOB 3.7 06/16/2021  ? AGRATIO 1.2 06/16/2021  ? BILITOT 0.8 06/16/2021  ? ALKPHOS 129 (H) 06/16/2021  ? AST 28 06/16/2021  ? ALT 22 06/16/2021  ? ANIONGAP 8 02/03/2019  ? ?Last lipids ?Lab Results  ?Component Value Date  ? CHOL 92 (L) 06/16/2021  ? HDL 48 06/16/2021  ? LDLCALC 28  06/16/2021  ? TRIG 80 06/16/2021  ? CHOLHDL 1.9 06/16/2021  ? ?Last hemoglobin A1c ?Lab Results  ?Component Value Date  ? HGBA1C 9.6 (H) 06/16/2021  ? ?  ?  Objective  ?  ?BP (!) 147/75 (BP Location: Left Arm, Patient Position: Sitting, Cuff Size: Normal)   Pulse 74   Temp 97.9 ?F (36.6 ?C) (Oral)   Resp 16   Wt 150 lb (68 kg)   BMI 27.44 kg/m?  ?BP Readings from Last 3 Encounters:  ?07/23/21 (!) 147/75  ?07/16/21 113/70  ?06/25/21 120/78  ? ?Wt Readings from Last 3 Encounters:  ?07/23/21 150 lb (68 kg)  ?07/16/21 151 lb (68.5 kg)   ?06/25/21 150 lb (68 kg)  ? ?  ? ?Physical Exam ?Vitals reviewed.  ?Constitutional:   ?   General: She is not in acute distress. ?   Appearance: Normal appearance. She is well-developed. She is not diaphoretic.  ?HENT:  ?   Head: Normocephalic and atraumatic.  ?Eyes:  ?   General: No scleral icterus. ?   Conjunctiva/sclera: Conjunctivae normal.  ?Neck:  ?   Thyroid: No thyromegaly.  ?Cardiovascular:  ?   Rate and Rhythm: Normal rate and regular rhythm.  ?   Pulses: Normal pulses.  ?   Heart sounds: Normal heart sounds. No murmur heard. ?Pulmonary:  ?   Effort: Pulmonary effort is normal. No respiratory distress.  ?   Breath sounds: Normal breath sounds. No wheezing, rhonchi or rales.  ?Musculoskeletal:  ?   Cervical back: Neck supple.  ?   Right lower leg: No edema.  ?   Left lower leg: No edema.  ?Lymphadenopathy:  ?   Cervical: No cervical adenopathy.  ?Skin: ?   General: Skin is warm and dry.  ?   Findings: No rash.  ?Neurological:  ?   Mental Status: She is alert and oriented to person, place, and time. Mental status is at baseline.  ?Psychiatric:     ?   Mood and Affect: Mood normal.     ?   Behavior: Behavior normal.  ?  ? ? ?No results found for any visits on 07/23/21. ? Assessment & Plan  ?  ? ?Problem List Items Addressed This Visit   ? ?  ? Endocrine  ? Type 2 diabetes mellitus with hyperglycemia, without long-term current use of insulin ([place])  ?  Chronic and uncontrolled with hyperglycemia ?Reviewed last A1c ?FBG is improving, but not to goal ?Continue glipizide and jardiance at current dose  ?Increase Rybelsus to 14 mg daily ?She is having some mild nausea, so we will have to see if this inhibits increasing her Rybelsus dose ?Low GFR inhibits ability to use most oral DM medicines ?Patient has needle phobia ?Up-to-date on screenings ?Follow-up in 2 months and recheck A1c ?  ?  ? Relevant Medications  ? Semaglutide (RYBELSUS) 14 MG TABS  ?  ? ?Return in about 2 months (around 09/22/2021) for chronic  disease f/u.  ?   ? ?I, Lavon Paganini, MD, have reviewed all documentation for this visit. The documentation on 07/23/21 for the exam, diagnosis, procedures, and orders are all accurate and complete. ? ? ?Virginia Crews, MD, MPH ?[place] ?[facility]   ?

## 2021-07-23 ENCOUNTER — Encounter: Payer: Self-pay | Admitting: Family Medicine

## 2021-07-23 ENCOUNTER — Other Ambulatory Visit: Payer: Self-pay | Admitting: Family Medicine

## 2021-07-23 ENCOUNTER — Ambulatory Visit (INDEPENDENT_AMBULATORY_CARE_PROVIDER_SITE_OTHER): Payer: Medicare Other | Admitting: Family Medicine

## 2021-07-23 DIAGNOSIS — E1165 Type 2 diabetes mellitus with hyperglycemia: Secondary | ICD-10-CM | POA: Diagnosis not present

## 2021-07-23 MED ORDER — RYBELSUS 14 MG PO TABS: 14.0000 mg | ORAL_TABLET | Freq: Every day | ORAL | 1 refills | Status: DC

## 2021-07-23 NOTE — Assessment & Plan Note (Signed)
Chronic and uncontrolled with hyperglycemia ?Reviewed last A1c ?FBG is improving, but not to goal ?Continue glipizide and jardiance at current dose  ?Increase Rybelsus to 14 mg daily ?She is having some mild nausea, so we will have to see if this inhibits increasing her Rybelsus dose ?Low GFR inhibits ability to use most oral DM medicines ?Patient has needle phobia ?Up-to-date on screenings ?Follow-up in 2 months and recheck A1c ?

## 2021-07-23 NOTE — Telephone Encounter (Signed)
Requested medication (s) are due for refill today: yes ? ?Requested medication (s) are on the active medication list: no ? ?Last refill:  05/30/19 ? ?Future visit scheduled: yes ? ?Notes to clinic:  Unable to refill per protocol, cannot delegate. ? ? ? ?  ?Requested Prescriptions  ?Pending Prescriptions Disp Refills  ? traMADol (ULTRAM) 50 MG tablet 20 tablet 0  ?  Sig: Take 1 tablet (50 mg total) by mouth every 6 (six) hours as needed.  ?  ? Not Delegated - Analgesics:  Opioid Agonists Failed - 07/23/2021 12:27 PM  ?  ?  Failed - This refill cannot be delegated  ?  ?  Failed - Urine Drug Screen completed in last 360 days  ?  ?  Passed - Valid encounter within last 3 months  ?  Recent Outpatient Visits   ? ?      ? Today Type 2 diabetes mellitus with hyperglycemia, without long-term current use of insulin ([place])  ? [facility] [place], Dionne Bucy, MD  ? 4 weeks ago Type 2 diabetes mellitus with hyperglycemia, without long-term current use of insulin ([place])  ? [facility] [place], Dionne Bucy, MD  ? 1 month ago Type 2 diabetes mellitus with hyperglycemia, without long-term current use of insulin ([place])  ? Ward, PA-C  ? 1 month ago Community acquired pneumonia, unspecified laterality  ? [employer], Erin E, PA-C  ? 1 month ago Acute cough  ? [employer], Dani Gobble, PA-C  ? ?  ?  ?Future Appointments   ? ?        ? In 2 months Bacigalupo, Dionne Bucy, MD [facility]  ? ?  ? ?  ?  ?  ? ? ?

## 2021-07-23 NOTE — Telephone Encounter (Signed)
Medication Refill - Medication: traMADol (ULTRAM) 50 MG tablet  ? ?Has the patient contacted their pharmacy? Yes.   ? ?(Agent: If yes, when and what did the pharmacy advise?) was told Dr need to send new Rx  ? ?Preferred Pharmacy (with phone number or street name):  ?[place], [place]  ?[address] [place] [address]  ?Phone: [phone] Fax: [phone]  ?Hours: Not open 24 hours  ? ?Has the patient been seen for an appointment in the last year OR does the patient have an upcoming appointment? Yes.   ? ?Agent: Please be advised that RX refills may take up to 3 business days. We ask that you follow-up with your pharmacy. ? ?

## 2021-08-04 ENCOUNTER — Telehealth: Payer: Self-pay

## 2021-08-04 NOTE — Telephone Encounter (Signed)
Faxed referral to [facility] at [phone]. ?

## 2021-08-10 ENCOUNTER — Emergency Department
Admission: EM | Admit: 2021-08-10 | Discharge: 2021-08-10 | Disposition: A | Discharge status: Home / Self Care | Payer: Medicare Other | Attending: Emergency Medicine | Admitting: Emergency Medicine

## 2021-08-10 ENCOUNTER — Emergency Department: Payer: Medicare Other

## 2021-08-10 ENCOUNTER — Ambulatory Visit: Payer: Self-pay

## 2021-08-10 ENCOUNTER — Other Ambulatory Visit: Payer: Self-pay

## 2021-08-10 ENCOUNTER — Ambulatory Visit: Payer: Self-pay | Admitting: *Deleted

## 2021-08-10 DIAGNOSIS — W010XXA Fall on same level from slipping, tripping and stumbling without subsequent striking against object, initial encounter: Secondary | ICD-10-CM | POA: Insufficient documentation

## 2021-08-10 DIAGNOSIS — S82001A Unspecified fracture of right patella, initial encounter for closed fracture: Secondary | ICD-10-CM

## 2021-08-10 DIAGNOSIS — S82091A Other fracture of right patella, initial encounter for closed fracture: Secondary | ICD-10-CM | POA: Diagnosis not present

## 2021-08-10 DIAGNOSIS — S8991XA Unspecified injury of right lower leg, initial encounter: Secondary | ICD-10-CM | POA: Diagnosis present

## 2021-08-10 MED ORDER — OXYCODONE-ACETAMINOPHEN 5-325 MG PO TABS: 1.0000 | ORAL_TABLET | ORAL | 0 refills | Status: DC | PRN

## 2021-08-10 NOTE — ED Notes (Signed)
Explained the importance of patient not driving with a broken knee due to patient needing to keep knee straight. Pt d/c with daughter. Pt daughter to drive patient home. ?

## 2021-08-10 NOTE — Telephone Encounter (Signed)
FYI

## 2021-08-10 NOTE — Discharge Instructions (Addendum)
Follow-up with Dr. Sharlet Salina or your regular orthopedist.  You have a knee Fracture, they called [place] a patella fracture.  It is not displaced.  It should heal appropriately by using the knee immobilizer.  Use the walker to help you not put as much pressure on your knee.  Apply ice to the area.  You were provided with a prescription for Percocet.  Take this mainly at nighttime do not want you to fall.  It may make you drowsy.  Take a stool softener while taking this medication as it can constipate you. ?

## 2021-08-10 NOTE — Telephone Encounter (Signed)
?Chief Complaint: right knee pain ?Symptoms: bruising to the front and back of right knee, back of knee painful and warm, ?Frequency: constant ?Pertinent Negatives: Patient denies chest pain or SOB   ?Disposition: [x] ED /[] Urgent Care (no appt availability in office) / [] Appointment(In office/virtual)/ []  [facility]/ [] Home Care/ [] Refused Recommended Disposition /[] [facility]/ []  Follow-up with PCP ?Additional NOTES: pt stated she will call daughter to take her ? ? ?Reason for Disposition ? [1] Thigh or calf pain AND [2] only 1 side AND [3] present > 1 hour ?   Behind right knee ? ?Answer Assessment - Initial Assessment Questions ?1. MECHANISM: "How did the fall happen?" ?    [address] forward tripped right  ?2. DOMESTIC VIOLENCE AND ELDER ABUSE SCREENING: "Did you fall because someone pushed you or tried to hurt you?" If Yes, ask: "Are you safe now?" ?    no ?3. ONSET: "When did the fall happen?" (e.g., minutes, hours, or days ago) ?    1 week ago ?4. LOCATION: "What part of the body hit the ground?" (e.g., back, buttocks, head, hips, knees, hands, head, stomach) ?    Feet, knees, hands ?5. INJURY: "Did you hurt (injure) yourself when you fell?" If Yes, ask: "What did you injure? Tell me more about this?" (e.g., body area; type of injury; pain severity)" ?    Bruisng to right knee and posterior knee ?6. PAIN: "Is there any pain?" If Yes, ask: "How bad is the pain?" (e.g., Scale 1-10; or mild,  ?moderate, severe) ?  - NONE (0): No pain ?  - MILD (1-3): Doesn't interfere with normal activities  ?  - MODERATE (4-7): Interferes with normal activities or awakens from sleep  ?  - SEVERE (8-10): Excruciating pain, unable to do any normal activities  ?    8/10 worse with activity ?7. SIZE: For cuts, bruises, or swelling, ask: "How large is it?" (e.g., inches or centimeters)  ?    Right knee hurts down the leg ?8. OTHER SYMPTOMS: "Do you have any other symptoms?" (e.g., dizziness, fever, weakness; new  onset or worsening).  ?    no ?9. CAUSE: "What do you think caused the fall (or falling)?" (e.g., tripped, dizzy spell) ?      tripped ? ?Answer Assessment - Initial Assessment Questions ?1. LOCATION: "Where is the swelling located?"  (e.g., left, right, both knees) ?    Right knee ?2. SIZE and DESCRIPTION: "What does the swelling look like?"  (e.g., entire knee, localized) ?    Blue and greenish yellow in places ?3. ONSET: "When did the swelling start?" "Does it come and go, or is it there all the time?" ?    Sice fall ?4. PAIN: "Is there any pain?" If Yes, ask: "How bad is it?" (Scale 1-10; or mild, moderate, severe) ?    8 ?5. SETTING: "Has there been any recent work, exercise or other activity that involved that part of the body?"  ?    Fell and right nee and hands ?6. AGGRAVATING FACTORS: "What makes the knee swelling worse?" (e.g., walking, climbing stairs, running) ?    Standing, bending leg ?7. ASSOCIATED SYMPTOMS: "Is there any pain or redness?" ?    yes ?8. OTHER SYMPTOMS: "Do you have any other symptoms?" (e.g., chest pain, difficulty breathing, fever, calf pain) ?    Pain and warmth behind right knee ?9. PREGNANCY: "Is there any chance you are pregnant?" "When was your last menstrual period?" ?  N/a ? ?Protocols used: Falls and Falling-A-AH, Knee Swelling-A-AH ? ?

## 2021-08-10 NOTE — Telephone Encounter (Signed)
Summary: Pt wants to share ED report  ? Pt says she was instructed to call triage and share her ED report with a nurse.  ? ?Best contact: [phone]   ?  ?Patient was triaged today and advised to call back to let provider know about her ED visit- patient reports her R knee is broken and she has been referred to ortho for follow up. ?Reason for Disposition ? [1] Follow-up call to recent contact AND [2] information only call, no triage required ? ?Answer Assessment - Initial Assessment Questions ?1. REASON FOR CALL or QUESTION: "What is your reason for calling today?" or "How can I best help you?" or "What question do you have that I can help answer?" ?    Patient calling with ED report/finding ? ?Protocols used: Information Only Call - No Triage-A-AH ? ?

## 2021-08-10 NOTE — ED Provider Notes (Signed)
? ?[facility] ?Provider Note ? ? ? Event Date/Time  ? First MD Initiated Contact with Patient 08/10/21 1111   ?  (approximate) ? ? ?History  ? ?Knee Pain ? ? ?HPI ? ?Kelli Gutierrez is a 71 y.o. female with history of stroke, rheumatoid arthritis, and other medical problems presents emergency department with complaints of right knee pain.  Patient fell on Friday injuring her right knee.  Denies any hip pain.  States the knee is bruised and swollen.  It is painful to bear weight.  States pain is gotten worse over the weekend.  No numbness or tingling.  No head injury. ? ?  ? ? ?Physical Exam  ? ?Triage Vital Signs: ?ED Triage Vitals [08/10/21 1034]  ?Enc Vitals Group  ?   BP (!) 141/67  ?   Pulse Rate 82  ?   Resp 17  ?   Temp 98 ?F (36.7 ?C)  ?   Temp Source Oral  ?   SpO2 99 %  ?   Weight 150 lb (68 kg)  ?   Height 5\' 2"  (1.575 m)  ?   Head Circumference   ?   Peak Flow   ?   Pain Score 8  ?   Pain Loc   ?   Pain Edu?   ?   Excl. in [place]?   ? ? ?Most recent vital signs: ?Vitals:  ? 08/10/21 1034  ?BP: (!) 141/67  ?Pulse: 82  ?Resp: 17  ?Temp: 98 ?F (36.7 ?C)  ?SpO2: 99%  ? ? ? ?General: Awake, no distress.   ?CV:  Good peripheral perfusion. regular rate and  rhythm ?Resp:  Normal effort.  ?Abd:  No distention.   ?Other:  Right knee is tender warm and swollen, bruising noted, neurovascular is intact, patient moves slowly and is difficult for her to bear weight on the right leg.  Hip is nontende ? ? ?ED Results / Procedures / Treatments  ? ?Labs ?(all labs ordered are listed, but only abnormal results are displayed) ?Labs Reviewed - No data to display ? ? ?EKG ? ? ? ? ?RADIOLOGY ?X-ray of the right knee ? ? ? ?PROCEDURES: ? ? ?Procedures ? ? ?MEDICATIONS ORDERED IN ED: ?Medications - No data to display ? ? ?IMPRESSION / MDM / ASSESSMENT AND PLAN / ED COURSE  ?I reviewed the triage vital signs and the nursing notes. ?             ?               ? ?Differential diagnosis includes, but is not limited  to, contusion, fracture, sprain ? ?X-ray of the right knee ordered ? ?X-ray of the right knee was independently reviewed by me.  Concerns for cortical fracture of the patella.  Confirmed by radiology.  We will do CT of the right knee to verify if this is actually a fracture.  Patient has history of stroke and will be difficult for her to walk in a knee immobilizer so want to ensure there is actually fracture. ? ?CT of the right knee was independently reviewed by me.  Does confirm patella fracture which is nondisplaced.  Confirmed by radiology ? ?I did explain these findings to the patient.  Is placed in the immobilizer.  Patient has a walker at home.  She is to follow-up with orthopedics.  She was given 10 Percocet for pain.  Encouraged her to only take these at night.  Take Tylenol throughout the day.  Apply ice.  Patient is in agreement treatment plan.  She does want to see [facility] as all of her doctors are there.  She was discharged stable condition. ? ? ? ?  ? ? ?FINAL CLINICAL IMPRESSION(S) / ED DIAGNOSES  ? ?Final diagnoses:  ?Closed nondisplaced fracture of right patella, unspecified fracture morphology, initial encounter  ? ? ? ?Rx / DC Orders  ? ?ED Discharge Orders   ? ?      Ordered  ?  oxyCODONE-acetaminophen (PERCOCET) 5-325 MG tablet  Every 4 hours PRN       ? 08/10/21 1243  ? ?  ?  ? ?  ? ? ? ?Note:  This document was prepared using Dragon voice recognition software and may include unintentional dictation errors. ? ?  ?Versie Starks, PA-C ?08/10/21 1302 ? ?  ?Harvest Dark, MD ?08/10/21 1357 ? ?

## 2021-08-10 NOTE — Telephone Encounter (Signed)
Noted  

## 2021-08-10 NOTE — ED Triage Notes (Signed)
Pt states she tripped and fell on Friday and has been having right knee pain since.  ?

## 2021-08-28 ENCOUNTER — Telehealth: Payer: Self-pay | Admitting: Family Medicine

## 2021-08-28 DIAGNOSIS — Z748 Other problems related to care provider dependency: Secondary | ICD-10-CM

## 2021-08-28 NOTE — Telephone Encounter (Signed)
Pt is having hand surgery on 6/14.  Pt needs someone to sit with her through the surgery, and to drive her to and from [facility] in [place]. Pt wants to know if you can refer her for personal care services like this? She has medicare and medicaid.  Please advise.  Pt calling with the assistance of Claiborne Billings, a Education officer, museum for clarification.Marland Kitchen

## 2021-08-31 ENCOUNTER — Ambulatory Visit: Payer: Medicare Other | Admitting: Family Medicine

## 2021-08-31 NOTE — Telephone Encounter (Signed)
I don't know if personal care services can provide transportation and sit with patients for surgeries.  I would usually refer to SW for more resources in this case.  She is already working with social work?  Do we need to refer to CCM?

## 2021-09-01 NOTE — Telephone Encounter (Signed)
Left message to call back  

## 2021-09-02 ENCOUNTER — Telehealth: Payer: Self-pay | Admitting: *Deleted

## 2021-09-02 NOTE — Chronic Care Management (AMB) (Signed)
  Care Management   Note  09/02/2021 Name: Kelli Gutierrez MRN: [id number] DOB: 29-Jan-1951  [name] is a 71 y.o. year old female who is a primary care patient of Bacigalupo, Dionne Bucy, MD. I reached out to Rexene Edison by phone today offer care coordination services.   [name] was given information about care management services today including:  Care management services include personalized support from designated clinical staff supervised by her physician, including individualized plan of care and coordination with other care providers 24/7 contact phone numbers for assistance for urgent and routine care needs. The patient may stop care management services at any time by phone call to the office staff.  Patient agreed to services and verbal consent obtained.   Follow up plan: Telephone appointment with care management team member scheduled for: 09/03/2021  Julian Hy, [place] Management  Direct Dial: [phone]

## 2021-09-03 ENCOUNTER — Ambulatory Visit (INDEPENDENT_AMBULATORY_CARE_PROVIDER_SITE_OTHER): Payer: Medicare Other | Admitting: *Deleted

## 2021-09-03 DIAGNOSIS — Z748 Other problems related to care provider dependency: Secondary | ICD-10-CM

## 2021-09-03 DIAGNOSIS — I152 Hypertension secondary to endocrine disorders: Secondary | ICD-10-CM

## 2021-09-03 DIAGNOSIS — E1165 Type 2 diabetes mellitus with hyperglycemia: Secondary | ICD-10-CM

## 2021-09-03 NOTE — Patient Instructions (Signed)
Visit Information  Thank you for taking time to visit with me today. Please don't hesitate to contact me if I can be of assistance to you before our next scheduled telephone appointment.  Following are the goals we discussed today: - begin a notebook of services in my neighborhood or community - follow-up on any referrals for help I am given - think ahead to make sure my need does not become an emergency - make a note about what I need to have by the phone or take with me, like an identification card or social security number have a back-up plan - have a back-up plan - make a list of family or friends that I can call  Our next appointment is by telephone on 09/17/21 at 1pm  Please call the care guide team at [phone] if you need to cancel or reschedule your appointment.   If you are experiencing a Mental Health or [place] or need someone to talk to, please call the Suicide and Crisis Lifeline: 988   Following is a copy of your full plan of care:  Care Plan : General Social Work (Adult)  Updates made by [employer], Darla Lesches, LCSW since 09/03/2021 12:00 AM     Problem: CHL AMB "PATIENT-SPECIFIC PROBLEM"   Note:   CARE PLAN ENTRY (see longitudinal plan of care for additional care plan information)  Current Barriers:  Patient with HTN and DM in need of assistance with connection to community resources for transportation and in home care support Patient plans to have hand surgery on 09/23/21-patient confirms that her daughter, [name] will provide transport and will remain with her for a few days following the surgery, however patient in need of additional in home care after daughter leaves Knowledge deficits and need for support, education and care coordination related to community resources support  Limited access to caregiver  Clinical Goal(s)   Over the next 90 days, patient will work with [facility] to address needs related to in home care  support Interventions provided by LCSW:  Assessed patient's care coordination needs related to transportation and in home care needs and discussed ongoing care management follow up  Confirmed that patient has been signed up for transportation through [employer] for ongoing transportation needs-patient's daughter will provide transport for patient's surgery and will remain with there for a few days following her surgery Discussed option of applying for personal care services through [facility] for ongoing in home care using her Medicaid benefit Provided patient with information about application process and the need for her provider to complete request form-form to be provided to provider for completion Collaborated with appropriate clinical care team members regarding patient needs PHQ2/ PHQ9 completed Solution-Focused Strategies employed:  Emotional Support Provided Participation in counseling encouraged -patient confirmed history of mental health treatment, however declines referral for mental health follow up  Patient Self Care Activities & Deficits:  Patient is unable to independently navigate community resource options without care coordination support  Acknowledges deficits and is motivated to resolve concern  Unable to perform ADLs independently Attends all scheduled provider appointments  Initial goal documentation       Ms. Fessel was given information about Care Management services by the embedded care coordination team including:  Care Management services include personalized support from designated clinical staff supervised by her physician, including individualized plan of care and coordination with other care providers 24/7 contact phone numbers for assistance for urgent and routine care needs. The  patient may stop CCM services at any time (effective at the end of the month) by phone call to the office staff.  Patient agreed to services and verbal consent obtained.    Patient verbalizes understanding of instructions and care plan provided today and agrees to view in [place]. Active MyChart status and patient understanding of how to access instructions and care plan via MyChart confirmed with patient.     Telephone follow up appointment with care management team member scheduled for: 09/17/21  Elliot Gurney, [place] [phone].

## 2021-09-03 NOTE — Chronic Care Management (AMB) (Signed)
Chronic Care Management    Clinical Social Work Note  09/03/2021 Name: Kelli Gutierrez MRN: [id number] DOB: 1950/06/05  Kelli Gutierrez is a 71 y.o. year old female who is a primary care patient of Bacigalupo, Kelli Bucy, MD. The CCM team was consulted to assist the patient with chronic disease management and/or care coordination needs related to: [employer] .   Engaged with patient by telephone for initial visit in response to provider referral for social work chronic care management and care coordination services.   Consent to Services:  The patient was given the following information about Chronic Care Management services today, agreed to services, and gave verbal consent: 1. CCM service includes personalized support from designated clinical staff supervised by the primary care provider, including individualized plan of care and coordination with other care providers 2. 24/7 contact phone numbers for assistance for urgent and routine care needs. 3. Service will only be billed when office clinical staff spend 20 minutes or more in a month to coordinate care. 4. Only one practitioner may furnish and bill the service in a calendar month. 5.The patient may stop CCM services at any time (effective at the end of the month) by phone call to the office staff. 6. The patient will be responsible for cost sharing (co-pay) of up to 20% of the service fee (after annual deductible is met). Patient agreed to services and consent obtained.  Patient agreed to services and consent obtained.   Assessment: Review of patient past medical history, allergies, medications, and health status, including review of relevant consultants reports was performed today as part of a comprehensive evaluation and provision of chronic care management and care coordination services.     SDOH (Social Determinants of Health) assessments and interventions performed:  SDOH Interventions    Flowsheet Row Most Recent Value  SDOH  Interventions   SDOH Interventions for the Following Domains Social Connections  Social Connections Interventions Other (Comment)  [patient requesting assistance with in home care aid]        Advanced Directives Status: See Care Plan for related entries.  CCM Care Plan  Allergies  Allergen Reactions   Hydrocodone     Other reaction(s): Dizziness    Outpatient Encounter Medications as of 09/03/2021  Medication Sig   aspirin 81 MG chewable tablet Chew 81 mg by mouth daily.   atorvastatin (LIPITOR) 20 MG tablet Take 1 tablet by mouth once daily   clopidogrel (PLAVIX) 75 MG tablet Take 1 tablet by mouth once daily   empagliflozin (JARDIANCE) 25 MG TABS tablet Take 1 tablet (25 mg total) by mouth daily before breakfast.   glipiZIDE (GLUCOTROL) 5 MG tablet TAKE 1/2 (ONE-HALF) TABLET BY MOUTH ONCE DAILY BEFORE BREAKFAST   glucose blood (CONTOUR NEXT TEST) test strip Use to test blood glucose as directed 1-4 times daily   hydroxychloroquine (PLAQUENIL) 200 MG tablet Take 200 mg by mouth daily.   Lancet Devices MISC Use to test blood glucose as directed 1-4 times daily   metoprolol succinate (TOPROL-XL) 25 MG 24 hr tablet Take 1 tablet by mouth once daily   oxyCODONE-acetaminophen (PERCOCET) 5-325 MG tablet Take 1 tablet by mouth every 4 (four) hours as needed for severe pain.   patiromer (VELTASSA) 8.4 g packet Take 1 packet (8.4 g total) by mouth daily.   pregabalin (LYRICA) 25 MG capsule Take 1 capsule by mouth twice daily   Semaglutide (RYBELSUS) 14 MG TABS Take 1 tablet (14 mg total) by mouth daily.  sevelamer carbonate (RENVELA) 800 MG tablet Take 1 tablet (800 mg total) by mouth 3 (three) times daily with meals.   No facility-administered encounter medications on file as of 09/03/2021.    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 ([place]) 05/08/2019   Anemia in stage 4 chronic kidney disease ([place]) 05/08/2019   Current moderate episode of major depressive disorder without prior episode ([place]) 05/07/2019   Neuropathic pain 09/14/2016   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) 09/14/2016   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) 08/18/2016   Vitamin D deficiency 06/23/2016   CKD (chronic kidney disease) stage 4, GFR 15-29 ml/min (HCC) 03/23/2016   Type 2 diabetes mellitus with hyperglycemia, without long-term current use of insulin ([place]) 03/18/2016   Rheumatoid arthritis 03/18/2016   Current use of long term anticoagulation (Plavix) 03/18/2016   Hyperlipidemia associated with type 2 diabetes mellitus ([place]) 03/18/2016   GERD (gastroesophageal reflux disease) 03/18/2016   Hypertension associated with diabetes ([place]) 09/30/2015   History of CVA (cerebrovascular accident) 04/02/2015    Conditions to be addressed/monitored: HTN, COPD, and DMII; Limited social support  Care Plan : General Social Work (Adult)  Updates made by [employer], Darla Lesches, LCSW since 09/03/2021 12:00 AM     Problem: CHL AMB "PATIENT-SPECIFIC PROBLEM"   Note:   CARE PLAN ENTRY (see longitudinal plan of care for additional care plan information)  Current Barriers:  Patient with HTN and DM in need of assistance with connection to community resources for transportation and in home care support Patient plans to have hand surgery on 09/23/21-patient confirms that her daughter, Kelli Gutierrez will provide transport and will remain with her for a few days following the surgery, however patient in need of additional in home care after daughter leaves Knowledge deficits and need for support, education and care coordination related to community resources support   Limited access to caregiver  Clinical Goal(s)   Over the next 90 days, patient will work with [facility] to address needs related to in home care support Interventions provided by LCSW:  Assessed patient's care coordination needs related to transportation and in home care needs and discussed ongoing care management follow up  Confirmed that patient has been signed up for transportation through [employer] for ongoing transportation needs-patient's daughter will provide transport for patient's surgery and will remain with there for a few days following her surgery Discussed option of applying for personal care services through [facility] for ongoing in home care using her Medicaid benefit Provided patient with information about application process and the need for her provider to complete request form-form to be provided to provider for completion Collaborated with appropriate clinical care team members regarding patient needs PHQ2/ PHQ9 completed Solution-Focused Strategies employed:  Emotional Support Provided Participation in counseling encouraged -patient confirmed history of mental health treatment, however declines referral for mental health follow up  Patient Self Care Activities & Deficits:  Patient is unable to independently navigate community resource options without care coordination support  Acknowledges deficits and is motivated to resolve concern  Unable to perform ADLs independently Attends all scheduled provider appointments  Initial goal  documentation        Follow Up Plan: SW will follow up with patient by phone over the next 14 business days      .cls

## 2021-09-09 DIAGNOSIS — I152 Hypertension secondary to endocrine disorders: Secondary | ICD-10-CM

## 2021-09-09 DIAGNOSIS — E1159 Type 2 diabetes mellitus with other circulatory complications: Secondary | ICD-10-CM

## 2021-09-09 DIAGNOSIS — E1165 Type 2 diabetes mellitus with hyperglycemia: Secondary | ICD-10-CM

## 2021-09-11 ENCOUNTER — Other Ambulatory Visit: Payer: Self-pay | Admitting: Family Medicine

## 2021-09-17 ENCOUNTER — Other Ambulatory Visit: Payer: Self-pay | Admitting: Family Medicine

## 2021-09-17 ENCOUNTER — Ambulatory Visit (INDEPENDENT_AMBULATORY_CARE_PROVIDER_SITE_OTHER): Payer: Medicare Other | Admitting: *Deleted

## 2021-09-17 DIAGNOSIS — I152 Hypertension secondary to endocrine disorders: Secondary | ICD-10-CM

## 2021-09-17 DIAGNOSIS — Z748 Other problems related to care provider dependency: Secondary | ICD-10-CM

## 2021-09-17 DIAGNOSIS — Z8673 Personal history of transient ischemic attack (TIA), and cerebral infarction without residual deficits: Secondary | ICD-10-CM

## 2021-09-17 DIAGNOSIS — E1165 Type 2 diabetes mellitus with hyperglycemia: Secondary | ICD-10-CM

## 2021-09-18 NOTE — Chronic Care Management (AMB) (Signed)
Chronic Care Management    Clinical Social Work Note  09/18/2021 Name: Kelli Gutierrez MRN: [id number] DOB: Jan 08, 1951  Kelli Gutierrez is a 71 y.o. year old female who is a primary care patient of Bacigalupo, Dionne Bucy, MD. The CCM team was consulted to assist the patient with chronic disease management and/or care coordination needs related to: [employer] .   Engaged with patient by telephone for follow up visit in response to provider referral for social work chronic care management and care coordination services.   Consent to Services:  The patient was given information about Chronic Care Management services, agreed to services, and gave verbal consent prior to initiation of services.  Please see initial visit note for detailed documentation.   Patient agreed to services and consent obtained.   Assessment: Review of patient past medical history, allergies, medications, and health status, including review of relevant consultants reports was performed today as part of a comprehensive evaluation and provision of chronic care management and care coordination services.     SDOH (Social Determinants of Health) assessments and interventions performed:    Advanced Directives Status: Not ready or willing to discuss.  CCM Care Plan  Allergies  Allergen Reactions   Hydrocodone     Other reaction(s): Dizziness    Outpatient Encounter Medications as of 09/17/2021  Medication Sig   aspirin 81 MG chewable tablet Chew 81 mg by mouth daily.   atorvastatin (LIPITOR) 20 MG tablet Take 1 tablet by mouth once daily   empagliflozin (JARDIANCE) 25 MG TABS tablet Take 1 tablet (25 mg total) by mouth daily before breakfast.   glipiZIDE (GLUCOTROL) 5 MG tablet TAKE 1/2 (ONE-HALF) TABLET BY MOUTH ONCE DAILY BEFORE BREAKFAST   glucose blood (CONTOUR NEXT TEST) test strip Use to test blood glucose as directed 1-4 times daily   hydroxychloroquine (PLAQUENIL) 200 MG tablet Take 200 mg by mouth daily.    Lancet Devices MISC Use to test blood glucose as directed 1-4 times daily   metoprolol succinate (TOPROL-XL) 25 MG 24 hr tablet Take 1 tablet by mouth once daily   oxyCODONE-acetaminophen (PERCOCET) 5-325 MG tablet Take 1 tablet by mouth every 4 (four) hours as needed for severe pain.   patiromer (VELTASSA) 8.4 g packet Take 1 packet (8.4 g total) by mouth daily.   pregabalin (LYRICA) 25 MG capsule Take 1 capsule by mouth twice daily   Semaglutide (RYBELSUS) 14 MG TABS Take 1 tablet (14 mg total) by mouth daily.   sevelamer carbonate (RENVELA) 800 MG tablet Take 1 tablet (800 mg total) by mouth 3 (three) times daily with meals.   [DISCONTINUED] clopidogrel (PLAVIX) 75 MG tablet Take 1 tablet by mouth once daily   No facility-administered encounter medications on file as of 09/17/2021.    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 ([place]) 05/08/2019   Anemia in stage 4 chronic kidney disease ([place]) 05/08/2019   Current moderate episode of major depressive disorder without prior episode ([place]) 05/07/2019   Neuropathic pain 09/14/2016   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) 09/14/2016   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) 08/18/2016   Vitamin D deficiency  06/23/2016   CKD (chronic kidney disease) stage 4, GFR 15-29 ml/min (HCC) 03/23/2016   Type 2 diabetes mellitus with hyperglycemia, without long-term current use of insulin ([place]) 03/18/2016   Rheumatoid arthritis 03/18/2016   Current use of long term anticoagulation (Plavix) 03/18/2016   Hyperlipidemia associated with type 2 diabetes mellitus ([place]) 03/18/2016   GERD (gastroesophageal reflux  disease) 03/18/2016   Hypertension associated with diabetes ([place]) 09/30/2015   History of CVA (cerebrovascular accident) 04/02/2015    Conditions to be addressed/monitored:  HTN, COPD, and DMII; Limited social support Care Plan : General Social Work (Adult)  Updates made by [employer], Darla Lesches, LCSW since 09/18/2021 12:00 AM     Problem: CHL AMB "PATIENT-SPECIFIC PROBLEM"   Note:   CARE PLAN ENTRY (see longitudinal plan of care for additional care plan information)  Current Barriers:  Patient with HTN and DM in need of assistance with connection to community resources for transportation and in home care support Patient plans to have hand surgery on 09/23/21-patient confirms that her daughter, [name] will provide transport and will remain with her for a few days following the surgery, however patient in need of additional in home care after daughter leaves Knowledge deficits and need for support, education and care coordination related to community resources support  Limited access to caregiver  Clinical Goal(s)   Over the next 90 days, patient will work with [facility] to address needs related to in home care support Interventions provided by LCSW:  Continued to assess patient's care coordination needs related to transportation and in home care needs and discussed ongoing care management follow up  Confirmed continued plan for patient to continue to use [employer] for ongoing transportation needs-patient's daughter will provide transport for patient's surgery on 09/23/21 and will remain with there for a few days following her surgery Confirmed that patient's provider has completed request form for personal care services through [facility] for ongoing in home care using her Medicaid benefit and has faxed it over to them for review-phone call to [facility] to confirm that request form has been received and ready for processing-confirmed that request has been  received and ready for processing-patient will need to call [place] [phone]-patient is agreeable Collaborated with appropriate clinical care team members regarding patient needs Solution-Focused Strategies employed:  Emotional Support Provided Participation in counseling encouraged -patient confirmed history of mental health treatment, however declines referral for mental health follow up  Patient Self Care Activities & Deficits:  Patient is unable to independently navigate community resource options without care coordination support  Acknowledges deficits and is motivated to resolve concern  Unable to perform ADLs independently Attends all scheduled provider appointments  Please see past updates related to this goal by clicking on the "Past Updates" button in the selected goal         Follow Up Plan: SW will follow up with patient by phone over the next 14 business days       [place], [place] Worker  [place] Care Management [phone]

## 2021-09-18 NOTE — Patient Instructions (Signed)
Visit Information  Thank you for taking time to visit with me today. Please don't hesitate to contact me if I can be of assistance to you before our next scheduled telephone appointment.  Following are the goals we discussed today:   - begin a notebook of services in my neighborhood or community - follow-up on any referrals for help I am given - think ahead to make sure my need does not become an emergency - make a note about what I need to have by the phone or take with me, like an identification card or social security number have a back-up plan - have a back-up plan - make a list of family or friends that I can call  -Please call [place] to schedule in home assessment for personal care services [phone]  Our next appointment is by telephone on 10/01/21 at 10am  Please call the care guide team at [phone] if you need to cancel or reschedule your appointment.   If you are experiencing a Mental Health or [place] or need someone to talk to, please call the Suicide and Crisis Lifeline: 988   Patient verbalizes understanding of instructions and care plan provided today and agrees to view in [place]. Active MyChart status and patient understanding of how to access instructions and care plan via MyChart confirmed with patient.     Telephone follow up appointment with care management team member scheduled for: 10/01/21   Elliot Gurney, [place] Worker  [facility] Care Management [phone]

## 2021-09-25 ENCOUNTER — Ambulatory Visit: Payer: Medicare Other | Admitting: Family Medicine

## 2021-10-01 ENCOUNTER — Ambulatory Visit: Payer: Medicare Other | Admitting: *Deleted

## 2021-10-01 DIAGNOSIS — E1165 Type 2 diabetes mellitus with hyperglycemia: Secondary | ICD-10-CM

## 2021-10-01 DIAGNOSIS — I152 Hypertension secondary to endocrine disorders: Secondary | ICD-10-CM

## 2021-10-01 DIAGNOSIS — Z748 Other problems related to care provider dependency: Secondary | ICD-10-CM

## 2021-10-01 NOTE — Patient Instructions (Signed)
Visit Information  Thank you for taking time to visit with me today. Please don't hesitate to contact me if I can be of assistance to you before our next scheduled telephone appointment.  Following are the goals we discussed today:  - Please contact your providers office with any additional community resource needs  If you are experiencing a Mental Health or [place] or need someone to talk to, please call the Suicide and Crisis Lifeline: 988   Patient verbalizes understanding of instructions and care plan provided today and agrees to view in [place]. Active MyChart status and patient understanding of how to access instructions and care plan via MyChart confirmed with patient.     No further follow up required: patient has declined further coordination for in home care and has verbalized having no additional community resource needs   [employer], [place] Worker  [facility] [place] Care Management [phone]

## 2021-10-04 ENCOUNTER — Other Ambulatory Visit: Payer: Self-pay | Admitting: Family Medicine

## 2021-10-04 DIAGNOSIS — E785 Hyperlipidemia, unspecified: Secondary | ICD-10-CM

## 2021-10-08 ENCOUNTER — Ambulatory Visit: Payer: Medicare Other | Admitting: Family Medicine

## 2021-10-09 DIAGNOSIS — I152 Hypertension secondary to endocrine disorders: Secondary | ICD-10-CM

## 2021-10-09 DIAGNOSIS — E1165 Type 2 diabetes mellitus with hyperglycemia: Secondary | ICD-10-CM

## 2021-10-09 DIAGNOSIS — E1159 Type 2 diabetes mellitus with other circulatory complications: Secondary | ICD-10-CM

## 2021-10-12 ENCOUNTER — Ambulatory Visit (INDEPENDENT_AMBULATORY_CARE_PROVIDER_SITE_OTHER): Payer: Medicare Other | Admitting: Family Medicine

## 2021-10-12 VITALS — BP 130/67 | HR 78 | Temp 98.3°F | Resp 16 | Ht 62.0 in | Wt 150.0 lb

## 2021-10-12 DIAGNOSIS — I152 Hypertension secondary to endocrine disorders: Secondary | ICD-10-CM

## 2021-10-12 DIAGNOSIS — E1159 Type 2 diabetes mellitus with other circulatory complications: Secondary | ICD-10-CM

## 2021-10-12 DIAGNOSIS — R2231 Localized swelling, mass and lump, right upper limb: Secondary | ICD-10-CM

## 2021-10-12 DIAGNOSIS — N184 Chronic kidney disease, stage 4 (severe): Secondary | ICD-10-CM

## 2021-10-12 DIAGNOSIS — E1165 Type 2 diabetes mellitus with hyperglycemia: Secondary | ICD-10-CM

## 2021-10-12 LAB — POCT GLYCOSYLATED HEMOGLOBIN (HGB A1C)
Est. average glucose Bld gHb Est-mCnc: 177
Hemoglobin A1C: 7.8 % — AB (ref 4.0–5.6)

## 2021-10-12 MED ORDER — METOPROLOL SUCCINATE ER 25 MG PO TB24: 25.0000 mg | ORAL_TABLET | Freq: Every day | ORAL | 1 refills | Status: DC

## 2021-10-12 NOTE — Patient Instructions (Addendum)
It was great to see you!  Our plans for today:  - no changes to your medications today.  - Call for an eye appointment.  - Come back in 3 months.   Take care and seek immediate care sooner if you develop any concerns.   Dr. Ky Barban

## 2021-10-12 NOTE — Progress Notes (Signed)
    SUBJECTIVE:   CHIEF COMPLAINT / HPI:   Hypertension: - Medications: metoprolol - Compliance: good - Denies any SOB, CP, vision changes, LE edema, medication SEs, or symptoms of hypotension  Diabetes, Type 2 - Last A1c 9.6 06/2021 - Medications: rybelsus (increased at last visit), glipizide, jardiance, metformin - Compliance: good. Just started on metformin last week.  - Checking BG at home: yes, fasting 180. - Eye exam: due - Foot exam: UTD - Microalbumin: UTD - Statin: yes - PNA vaccine: UTD - Denies symptoms of hypoglycemia, polyuria, polydipsia, numbness extremities, foot ulcers/trauma - less polyuria since starting metformin.   CHRONIC KIDNEY DISEASE CKD status: better Medications renally dose: yes Previous renal evaluation: yes Pneumovax:  Up to Date Influenza Vaccine:   postponed to flu season  R wrist ganglion cyst - s/p surgery 09/23/21. Doing well. Goes back to see hand surgeon 7/18.   OBJECTIVE:   BP 130/67 (BP Location: Left Arm, Patient Position: Sitting, Cuff Size: Normal)   Pulse 78   Temp 98.3 F (36.8 C) (Oral)   Resp 16   Ht 5\' 2"  (1.575 m)   Wt 150 lb (68 kg)   SpO2 100%   BMI 27.44 kg/m   Gen: well appearing, in NAD Card: Reg rate Lungs: Comfortable WOB on RA Ext: WWP, no edema   ASSESSMENT/PLAN:   Type 2 diabetes mellitus with hyperglycemia, without long-term current use of insulin (HCC) A1c improved since increase in rybelsus, 7.8. still with elevated fasting CBGs but just started metformin by nephrologist. Will continue current regimen and f/u in 3 months for repeat A1c. Cr improved. Encouraged to call for eye exam.   CKD (chronic kidney disease) stage 4, GFR 15-29 ml/min (HCC) Improved on last labs. Work to control DM, HTN. Continue to follow with nephro.  Subcutaneous mass of right forearm S/p ganglion surgery. Healing well. Continue to follow with Hand surgery.  Hypertension associated with diabetes ([place]) Doing well on current  regimen, no changes made today. Refill provided.     Myles Gip, DO

## 2021-10-12 NOTE — Progress Notes (Deleted)
      Established patient visit   Patient: Kelli Gutierrez   DOB: 1950-10-08   71 y.o. Female  MRN: [id number] Visit Date: 10/12/2021  Today's healthcare provider: Myles Gip, DO   I,Tiffany J Bragg,acting as a scribe for Myles Gip, DO.,have documented all relevant documentation on the behalf of Myles Gip, DO,as directed by  Myles Gip, DO while in the presence of Myles Gip, DO.   Chief Complaint  Patient presents with   Diabetes   Subjective    HPI  ***  Medications: Outpatient Medications Prior to Visit  Medication Sig   aspirin 81 MG chewable tablet Chew 81 mg by mouth daily.   atorvastatin (LIPITOR) 20 MG tablet Take 1 tablet by mouth once daily   clopidogrel (PLAVIX) 75 MG tablet Take 1 tablet by mouth once daily   empagliflozin (JARDIANCE) 25 MG TABS tablet Take 1 tablet (25 mg total) by mouth daily before breakfast.   glipiZIDE (GLUCOTROL) 5 MG tablet TAKE 1/2 (ONE-HALF) TABLET BY MOUTH ONCE DAILY BEFORE BREAKFAST   glucose blood (CONTOUR NEXT TEST) test strip Use to test blood glucose as directed 1-4 times daily   hydroxychloroquine (PLAQUENIL) 200 MG tablet Take 200 mg by mouth daily.   Lancet Devices MISC Use to test blood glucose as directed 1-4 times daily   metFORMIN (GLUCOPHAGE-XR) 500 MG 24 hr tablet Take by mouth.   metoprolol succinate (TOPROL-XL) 25 MG 24 hr tablet Take 1 tablet by mouth once daily   oxyCODONE-acetaminophen (PERCOCET) 5-325 MG tablet Take 1 tablet by mouth every 4 (four) hours as needed for severe pain.   patiromer (VELTASSA) 8.4 g packet Take 1 packet (8.4 g total) by mouth daily.   pregabalin (LYRICA) 25 MG capsule Take 1 capsule by mouth twice daily   Semaglutide (RYBELSUS) 14 MG TABS Take 1 tablet (14 mg total) by mouth daily.   sevelamer carbonate (RENVELA) 800 MG tablet Take 1 tablet (800 mg total) by mouth 3 (three) times daily with meals.   No facility-administered medications prior to visit.    Review  of Systems  {Labs  Heme  Chem  Endocrine  Serology  Results Review (optional):23779}   Objective    BP 130/67 (BP Location: Left Arm, Patient Position: Sitting, Cuff Size: Normal)   Pulse 78   Temp 98.3 F (36.8 C) (Oral)   Resp 16   Ht 5\' 2"  (1.575 m)   Wt 150 lb (68 kg)   SpO2 100%   BMI 27.44 kg/m  {Show previous vital signs (optional):23777}  Physical Exam  ***  No results found for any visits on 10/12/21.  Assessment & Plan     ***  No follow-ups on file.      {provider attestation***:1}   Myles Gip, DO  [facility] [phone] (phone) [phone] (fax)  [place]

## 2021-10-12 NOTE — Assessment & Plan Note (Signed)
Doing well on current regimen, no changes made today. Refill provided.

## 2021-10-12 NOTE — Assessment & Plan Note (Signed)
A1c improved since increase in rybelsus, 7.8. still with elevated fasting CBGs but just started metformin by nephrologist. Will continue current regimen and f/u in 3 months for repeat A1c. Cr improved. Encouraged to call for eye exam.

## 2021-10-12 NOTE — Assessment & Plan Note (Signed)
S/p ganglion surgery. Healing well. Continue to follow with Hand surgery.

## 2021-10-12 NOTE — Assessment & Plan Note (Signed)
Improved on last labs. Work to control DM, HTN. Continue to follow with nephro.

## 2021-10-15 ENCOUNTER — Ambulatory Visit (INDEPENDENT_AMBULATORY_CARE_PROVIDER_SITE_OTHER): Payer: Medicare Other

## 2021-10-15 VITALS — Wt 150.0 lb

## 2021-10-15 DIAGNOSIS — Z1231 Encounter for screening mammogram for malignant neoplasm of breast: Secondary | ICD-10-CM

## 2021-10-15 DIAGNOSIS — Z0000 Encounter for general adult medical examination without abnormal findings: Secondary | ICD-10-CM | POA: Diagnosis not present

## 2021-10-15 NOTE — Progress Notes (Signed)
Virtual Visit via Telephone Note  I connected with  Kelli Gutierrez on 10/15/21 at 10:00 AM EDT by telephone and verified that I am speaking with the correct person using two identifiers.  Location: Patient: home Provider: [facility] Persons participating in the virtual visit: [place]   I discussed the limitations, risks, security and privacy concerns of performing an evaluation and management service by telephone and the availability of in person appointments. The patient expressed understanding and agreed to proceed.  Interactive audio and video telecommunications were attempted between this nurse and patient, however failed, due to patient having technical difficulties OR patient did not have access to video capability.  We continued and completed visit with audio only.  Some vital signs may be absent or patient reported.   Dionisio David, LPN  Subjective:   Kelli Gutierrez is a 71 y.o. female who presents for Medicare Annual (Subsequent) preventive examination.  Review of Systems           Objective:    There were no vitals filed for this visit. There is no height or weight on file to calculate BMI.     08/10/2021   10:36 AM 08/08/2019   11:49 AM 05/02/2019    9:19 AM 02/03/2019   12:18 PM 08/10/2017    8:47 AM 09/14/2016    9:49 AM 08/18/2016    8:37 AM  Advanced Directives  Does Patient Have a Medical Advance Directive? No No No No No No No  Would patient like information on creating a medical advance directive? No - Patient declined  No - Patient declined No - Patient declined No - Patient declined      Current Medications (verified) Outpatient Encounter Medications as of 10/15/2021  Medication Sig   aspirin 81 MG chewable tablet Chew 81 mg by mouth daily.   atorvastatin (LIPITOR) 20 MG tablet Take 1 tablet by mouth once daily   clopidogrel (PLAVIX) 75 MG tablet Take 1 tablet by mouth once daily   empagliflozin (JARDIANCE) 25 MG TABS tablet Take 1 tablet (25 mg  total) by mouth daily before breakfast.   glipiZIDE (GLUCOTROL) 5 MG tablet TAKE 1/2 (ONE-HALF) TABLET BY MOUTH ONCE DAILY BEFORE BREAKFAST   glucose blood (CONTOUR NEXT TEST) test strip Use to test blood glucose as directed 1-4 times daily   hydroxychloroquine (PLAQUENIL) 200 MG tablet Take 200 mg by mouth daily.   Lancet Devices MISC Use to test blood glucose as directed 1-4 times daily   metFORMIN (GLUCOPHAGE-XR) 500 MG 24 hr tablet Take by mouth.   metoprolol succinate (TOPROL-XL) 25 MG 24 hr tablet Take 1 tablet (25 mg total) by mouth daily.   patiromer (VELTASSA) 8.4 g packet Take 1 packet (8.4 g total) by mouth daily.   pregabalin (LYRICA) 25 MG capsule Take 1 capsule by mouth twice daily   Semaglutide (RYBELSUS) 14 MG TABS Take 1 tablet (14 mg total) by mouth daily.   sevelamer carbonate (RENVELA) 800 MG tablet Take 1 tablet (800 mg total) by mouth 3 (three) times daily with meals.   oxyCODONE-acetaminophen (PERCOCET) 5-325 MG tablet Take 1 tablet by mouth every 4 (four) hours as needed for severe pain. (Patient not taking: Reported on 10/15/2021)   No facility-administered encounter medications on file as of 10/15/2021.    Allergies (verified) Hydrocodone   History: Past Medical History:  Diagnosis Date   Acute CVA (cerebrovascular accident) ([place]) 12/17/2014   Anemia    Breast pain, right 06/01/2015   Chronic  kidney disease 12/2015   CVA (cerebral infarction) 12/17/2014   Diabetes mellitus without complication (HCC)    GERD (gastroesophageal reflux disease)    Hyperlipidemia    Hypertension    Lumbar radiculitis 12/06/2013   Meningitis    Rheumatoid arthritis ([place])    Stroke [facility])    Past Surgical History:  Procedure Laterality Date   APPENDECTOMY     CESAREAN SECTION     4   CHOLECYSTECTOMY     MIDDLE EAR SURGERY     Family History  Problem Relation Age of Onset   Leukemia Mother        unsure of conditions, was "healthy, old lady"   Stroke Father    Diabetes Father     Diabetes Sister    Breast cancer Neg Hx    Colon cancer Neg Hx    Social History   Socioeconomic History   Marital status: Legally Separated    Spouse name: Not on file   Number of children: 4   Years of education: Not on file   Highest education level: 8th grade  Occupational History   Occupation: retired   Occupation: disable  Tobacco Use   Smoking status: Never   Smokeless tobacco: Never  Vaping Use   Vaping Use: Never used  Substance and Sexual Activity   Alcohol use: No   Drug use: Never   Sexual activity: Not Currently  Other Topics Concern   Not on file  Social History Narrative   Not on file   Social Determinants of Health   Financial Resource Strain: Low Risk  (09/03/2021)   Overall Financial Resource Strain (CARDIA)    Difficulty of Paying Living Expenses: Not hard at all  Food Insecurity: No Food Insecurity (09/03/2021)   Hunger Vital Sign    Worried About Running Out of Food in the Last Year: Never true    Ran Out of Food in the Last Year: Never true  Transportation Needs: No Transportation Needs (09/03/2021)   PRAPARE - Hydrologist (Medical): No    Lack of Transportation (Non-Medical): No  Physical Activity: Inactive (10/15/2021)   Exercise Vital Sign    Days of Exercise per Week: 0 days    Minutes of Exercise per Session: 0 min  Stress: No Stress Concern Present (10/15/2021)   [place]    Feeling of Stress : Not at all  Social Connections: Socially Isolated (09/03/2021)   Social Connection and Isolation Panel [NHANES]    Frequency of Communication with Friends and Family: Three times a week    Frequency of Social Gatherings with Friends and Family: Twice a week    Attends Religious Services: Never    Marine scientist or Organizations: No    Attends Music therapist: Never    Marital Status: Separated    Tobacco Counseling Counseling  given: Not Answered   Clinical Intake:  Pre-visit preparation completed: Yes  Pain : No/denies pain     Nutritional Risks: None Diabetes: Yes CBG done?: No Did pt. bring in CBG monitor from home?: No  How often do you need to have someone help you when you read instructions, pamphlets, or other written materials from your doctor or pharmacy?: 1 - Never  Diabetic?yes Nutrition Risk Assessment:  Has the patient had any N/V/D within the last 2 months?  Yes  Does the patient have any non-healing wounds?  No  Has the patient  had any unintentional weight loss or weight gain?  No   Diabetes:  Is the patient diabetic?  Yes  If diabetic, was a CBG obtained today?  No  Did the patient bring in their glucometer from home?  No  How often do you monitor your CBG's? Once/week  Financial Strains and Diabetes Management:  Are you having any financial strains with the device, your supplies or your medication? No .  Does the patient want to be seen by Chronic Care Management for management of their diabetes?  No  Would the patient like to be referred to a Nutritionist or for Diabetic Management?  No   Diabetic Exams:  Diabetic Eye Exam: Completed 08/25/20. Overdue for diabetic eye exam. Pt has been advised about the importance in completing this exam.   Diabetic Foot Exam: Completed 02/16/21. Pt has been advised about the importance in completing this exam.  Interpreter Needed?: No  Information entered by :: Kirke Shaggy, LPN   Activities of Daily Living    07/23/2021    9:24 AM 05/12/2021    8:49 AM  In your present state of health, do you have any difficulty performing the following activities:  Hearing? 1 1  Vision? 1 1  Difficulty concentrating or making decisions? 1 1  Walking or climbing stairs? 1 1  Dressing or bathing? 0 0  Doing errands, shopping? 0 0    Patient Care Team: Bacigalupo, Dionne Bucy, MD as PCP - General (Family Medicine) Clarice Pole, MD as Referring  Physician (Nephrology) Emmaline Kluver., MD (Rheumatology) Yolonda Kida, MD as Consulting Physician (Cardiology) Dingeldein, Remo Lipps, MD (Ophthalmology)  Indicate any recent Medical Services you may have received from other than [facility] providers in the past year (date may be approximate).     Assessment:   This is a routine wellness examination for Reniya.  Hearing/Vision screen No results found.  Dietary issues and exercise activities discussed:     Goals Addressed             This Visit's Progress    DIET - EAT MORE FRUITS AND VEGETABLES         Depression Screen    10/15/2021   10:18 AM 09/03/2021    1:33 PM 07/23/2021    9:24 AM 05/12/2021    8:48 AM 02/16/2021    1:23 PM 10/09/2020   10:23 AM 07/15/2020    9:16 AM  PHQ 2/9 Scores  PHQ - 2 Score 2 2 3 3 3 4    PHQ- 9 Score 5 5 13 8 8 9       Information is confidential and restricted. Go to Review Flowsheets to unlock data.    Fall Risk    07/23/2021    9:25 AM 05/12/2021    8:48 AM 10/09/2020   10:24 AM 06/24/2020    8:29 AM 03/10/2020   11:13 AM  Fall Risk   Falls in the past year? 1 1 0 0 0  Number falls in past yr: 0 0 0 0 0  Injury with Fall? 0 0 0 0 0  Risk for fall due to : No Fall Risks  No Fall Risks No Fall Risks No Fall Risks  Follow up Falls evaluation completed  Falls evaluation completed Falls evaluation completed Falls evaluation completed    [place]:  Any stairs in or around the home? No  If so, are there any without handrails? No  Home free of loose throw  rugs in walkways, pet beds, electrical cords, etc? Yes  Adequate lighting in your home to reduce risk of falls? Yes   ASSISTIVE DEVICES UTILIZED TO PREVENT FALLS:  Life alert? No  Use of a cane, walker or w/c? Yes  Grab bars in the bathroom? Yes  Shower chair or bench in shower? Yes  Elevated toilet seat or a handicapped toilet? Yes    Cognitive Function:declined test 2023         10/09/2020   10:24 AM 08/08/2019   11:57 AM  6CIT Screen  What Year? 0 points 0 points  What month? 0 points 0 points  What time? 0 points 0 points  Count back from 20 0 points 0 points  Months in reverse 2 points 0 points  Repeat phrase 0 points 2 points  Total Score 2 points 2 points    Immunizations Immunization History  Administered Date(s) Administered   Fluad Quad(high Dose 65+) 03/24/2020, 02/16/2021   PFIZER(Purple Top)SARS-COV-2 Vaccination 07/06/2019, 08/01/2019, 03/10/2020   Pneumococcal Conjugate-13 02/13/2018   Pneumococcal Polysaccharide-23 09/16/2015   Td 10/21/1993, 11/24/1993, 01/04/1995   Tdap 01/20/2021    TDAP status: Up to date  Flu Vaccine status: Up to date  Pneumococcal vaccine status: Up to date  Covid-19 vaccine status: Completed vaccines  Qualifies for Shingles Vaccine? Yes   Zostavax completed No   Shingrix Completed?: No.    Education has been provided regarding the importance of this vaccine. Patient has been advised to call insurance company to determine out of pocket expense if they have not yet received this vaccine. Advised may also receive vaccine at local pharmacy or Health Dept. Verbalized acceptance and understanding.  Screening Tests Health Maintenance  Topic Date Due   Zoster Vaccines- Shingrix (1 of 2) Never done   COVID-19 Vaccine (4 - Booster for Pfizer series) 05/05/2020   OPHTHALMOLOGY EXAM  08/25/2021   INFLUENZA VACCINE  11/10/2021   FOOT EXAM  02/16/2022   HEMOGLOBIN A1C  04/14/2022   URINE MICROALBUMIN  06/26/2022   MAMMOGRAM  10/17/2022   Fecal DNA (Cologuard)  02/10/2023   TETANUS/TDAP  01/21/2031   Pneumonia Vaccine 7+ Years old  Completed   DEXA SCAN  Completed   Hepatitis C Screening  Completed   HPV VACCINES  Aged Out    Health Maintenance  Health Maintenance Due  Topic Date Due   Zoster Vaccines- Shingrix (1 of 2) Never done   COVID-19 Vaccine (4 - Booster for Pfizer series) 05/05/2020   OPHTHALMOLOGY  EXAM  08/25/2021    Colorectal cancer screening: Type of screening: Cologuard. Completed 02/29/20. Repeat every 3 years  Mammogram status: Completed 10/16/20. Repeat every year  Bone Density status: Completed 01/03/20. Results reflect: Bone density results: OSTEOPENIA. Repeat every 5 years.  Lung Cancer Screening: (Low Dose CT Chest recommended if Age 65-80 years, 30 pack-year currently smoking OR have quit w/in 15years.) does not qualify.   Additional Screening:  Hepatitis C Screening: does qualify; Completed 05/07/19  Vision Screening: Recommended annual ophthalmology exams for early detection of glaucoma and other disorders of the eye. Is the patient up to date with their annual eye exam?  Yes  Who is the provider or what is the name of the office in which the patient attends annual eye exams? [facility] If pt is not established with a provider, would they like to be referred to a provider to establish care? No .   Dental Screening: Recommended annual dental exams for proper oral hygiene  Community Resource Referral / Chronic Care Management: CRR required this visit?  No   CCM required this visit?  No      Plan:     I have personally reviewed and noted the following in the patient's chart:   Medical and social history Use of alcohol, tobacco or illicit drugs  Current medications and supplements including opioid prescriptions.  Functional ability and status Nutritional status Physical activity Advanced directives List of other physicians Hospitalizations, surgeries, and ER visits in previous 12 months Vitals Screenings to include cognitive, depression, and falls Referrals and appointments  In addition, I have reviewed and discussed with patient certain preventive protocols, quality metrics, and best practice recommendations. A written personalized care plan for preventive services as well as general preventive health recommendations were provided to patient.      Dionisio David, LPN   09/18/1155   Nurse Notes: none

## 2021-10-15 NOTE — Patient Instructions (Signed)
Ms. Bega , Thank you for taking time to come for your Medicare Wellness Visit. I appreciate your ongoing commitment to your health goals. Please review the following plan we discussed and let me know if I can assist you in the future.   Screening recommendations/referrals: Colonoscopy: Cologuard 02/29/20 Mammogram: 10/16/20, referral sent Bone Density: 01/03/20 Recommended yearly ophthalmology/optometry visit for glaucoma screening and checkup Recommended yearly dental visit for hygiene and checkup  Vaccinations: Influenza vaccine: 02/16/21 Pneumococcal vaccine: 02/13/18 Tdap vaccine: 01/20/21 Shingles vaccine: n/d   Covid-19:07/06/19, 08/01/19, 03/10/20  Advanced directives: no  Conditions/risks identified: none  Next appointment: Follow up in one year for your annual wellness visit 10/19/22 @ 9:30 am by phone   Preventive Care 65 Years and Older, Female Preventive care refers to lifestyle choices and visits with your health care provider that can promote health and wellness. What does preventive care include? A yearly physical exam. This is also called an annual well check. Dental exams once or twice a year. Routine eye exams. Ask your health care provider how often you should have your eyes checked. Personal lifestyle choices, including: Daily care of your teeth and gums. Regular physical activity. Eating a healthy diet. Avoiding tobacco and drug use. Limiting alcohol use. Practicing safe sex. Taking low-dose aspirin every day. Taking vitamin and mineral supplements as recommended by your health care provider. What happens during an annual well check? The services and screenings done by your health care provider during your annual well check will depend on your age, overall health, lifestyle risk factors, and family history of disease. Counseling  Your health care provider may ask you questions about your: Alcohol use. Tobacco use. Drug use. Emotional well-being. Home and  relationship well-being. Sexual activity. Eating habits. History of falls. Memory and ability to understand (cognition). Work and work Statistician. Reproductive health. Screening  You may have the following tests or measurements: Height, weight, and BMI. Blood pressure. Lipid and cholesterol levels. These may be checked every 5 years, or more frequently if you are over 66 years old. Skin check. Lung cancer screening. You may have this screening every year starting at age 78 if you have a 30-pack-year history of smoking and currently smoke or have quit within the past 15 years. Fecal occult blood test (FOBT) of the stool. You may have this test every year starting at age 54. Flexible sigmoidoscopy or colonoscopy. You may have a sigmoidoscopy every 5 years or a colonoscopy every 10 years starting at age 75. Hepatitis C blood test. Hepatitis B blood test. Sexually transmitted disease (STD) testing. Diabetes screening. This is done by checking your blood sugar (glucose) after you have not eaten for a while (fasting). You may have this done every 1-3 years. Bone density scan. This is done to screen for osteoporosis. You may have this done starting at age 6. Mammogram. This may be done every 1-2 years. Talk to your health care provider about how often you should have regular mammograms. Talk with your health care provider about your test results, treatment options, and if necessary, the need for more tests. Vaccines  Your health care provider may recommend certain vaccines, such as: Influenza vaccine. This is recommended every year. Tetanus, diphtheria, and acellular pertussis (Tdap, Td) vaccine. You may need a Td booster every 10 years. Zoster vaccine. You may need this after age 45. Pneumococcal 13-valent conjugate (PCV13) vaccine. One dose is recommended after age 29. Pneumococcal polysaccharide (PPSV23) vaccine. One dose is recommended after age 36. Talk to  your health care provider  about which screenings and vaccines you need and how often you need them. This information is not intended to replace advice given to you by your health care provider. Make sure you discuss any questions you have with your health care provider. Document Released: 04/25/2015 Document Revised: 12/17/2015 Document Reviewed: 01/28/2015 Elsevier Interactive Patient Education  2017 [place] Prevention in the Home Falls can cause injuries. They can happen to people of all ages. There are many things you can do to make your home safe and to help prevent falls. What can I do on the outside of my home? Regularly fix the edges of walkways and driveways and fix any cracks. Remove anything that might make you trip as you walk through a door, such as a raised step or threshold. Trim any bushes or trees on the path to your home. Use bright outdoor lighting. Clear any walking paths of anything that might make someone trip, such as rocks or tools. Regularly check to see if handrails are loose or broken. Make sure that both sides of any steps have handrails. Any raised decks and porches should have guardrails on the edges. Have any leaves, snow, or ice cleared regularly. Use sand or salt on walking paths during winter. Clean up any spills in your garage right away. This includes oil or grease spills. What can I do in the bathroom? Use night lights. Install grab bars by the toilet and in the tub and shower. Do not use towel bars as grab bars. Use non-skid mats or decals in the tub or shower. If you need to sit down in the shower, use a plastic, non-slip stool. Keep the floor dry. Clean up any water that spills on the floor as soon as it happens. Remove soap buildup in the tub or shower regularly. Attach bath mats securely with double-sided non-slip rug tape. Do not have throw rugs and other things on the floor that can make you trip. What can I do in the bedroom? Use night lights. Make sure  that you have a light by your bed that is easy to reach. Do not use any sheets or blankets that are too big for your bed. They should not hang down onto the floor. Have a firm chair that has side arms. You can use this for support while you get dressed. Do not have throw rugs and other things on the floor that can make you trip. What can I do in the kitchen? Clean up any spills right away. Avoid walking on wet floors. Keep items that you use a lot in easy-to-reach places. If you need to reach something above you, use a strong step stool that has a grab bar. Keep electrical cords out of the way. Do not use floor polish or wax that makes floors slippery. If you must use wax, use non-skid floor wax. Do not have throw rugs and other things on the floor that can make you trip. What can I do with my stairs? Do not leave any items on the stairs. Make sure that there are handrails on both sides of the stairs and use them. Fix handrails that are broken or loose. Make sure that handrails are as long as the stairways. Check any carpeting to make sure that it is firmly attached to the stairs. Fix any carpet that is loose or worn. Avoid having throw rugs at the top or bottom of the stairs. If you do have throw rugs, attach  them to the floor with carpet tape. Make sure that you have a light switch at the top of the stairs and the bottom of the stairs. If you do not have them, ask someone to add them for you. What else can I do to help prevent falls? Wear shoes that: Do not have high heels. Have rubber bottoms. Are comfortable and fit you well. Are closed at the toe. Do not wear sandals. If you use a stepladder: Make sure that it is fully opened. Do not climb a closed stepladder. Make sure that both sides of the stepladder are locked into place. Ask someone to hold it for you, if possible. Clearly mark and make sure that you can see: Any grab bars or handrails. First and last steps. Where the edge of  each step is. Use tools that help you move around (mobility aids) if they are needed. These include: Canes. Walkers. Scooters. Crutches. Turn on the lights when you go into a dark area. Replace any light bulbs as soon as they burn out. Set up your furniture so you have a clear path. Avoid moving your furniture around. If any of your floors are uneven, fix them. If there are any pets around you, be aware of where they are. Review your medicines with your doctor. Some medicines can make you feel dizzy. This can increase your chance of falling. Ask your doctor what other things that you can do to help prevent falls. This information is not intended to replace advice given to you by your health care provider. Make sure you discuss any questions you have with your health care provider. Document Released: 01/23/2009 Document Revised: 09/04/2015 Document Reviewed: 05/03/2014 Elsevier Interactive Patient Education  2017 [employer].

## 2021-12-25 ENCOUNTER — Other Ambulatory Visit: Payer: Self-pay | Admitting: Family Medicine

## 2021-12-25 DIAGNOSIS — Z8673 Personal history of transient ischemic attack (TIA), and cerebral infarction without residual deficits: Secondary | ICD-10-CM

## 2022-01-19 ENCOUNTER — Encounter: Payer: Self-pay | Admitting: Family Medicine

## 2022-01-19 ENCOUNTER — Other Ambulatory Visit: Payer: Self-pay | Admitting: Family Medicine

## 2022-01-19 ENCOUNTER — Ambulatory Visit (INDEPENDENT_AMBULATORY_CARE_PROVIDER_SITE_OTHER): Payer: Medicare Other | Admitting: Family Medicine

## 2022-01-19 VITALS — BP 128/76 | HR 80 | Temp 98.0°F | Resp 16 | Wt 141.4 lb

## 2022-01-19 DIAGNOSIS — N184 Chronic kidney disease, stage 4 (severe): Secondary | ICD-10-CM

## 2022-01-19 DIAGNOSIS — E1165 Type 2 diabetes mellitus with hyperglycemia: Secondary | ICD-10-CM

## 2022-01-19 DIAGNOSIS — E1159 Type 2 diabetes mellitus with other circulatory complications: Secondary | ICD-10-CM | POA: Diagnosis not present

## 2022-01-19 DIAGNOSIS — F321 Major depressive disorder, single episode, moderate: Secondary | ICD-10-CM

## 2022-01-19 DIAGNOSIS — M069 Rheumatoid arthritis, unspecified: Secondary | ICD-10-CM

## 2022-01-19 DIAGNOSIS — I152 Hypertension secondary to endocrine disorders: Secondary | ICD-10-CM

## 2022-01-19 DIAGNOSIS — E1169 Type 2 diabetes mellitus with other specified complication: Secondary | ICD-10-CM

## 2022-01-19 DIAGNOSIS — E785 Hyperlipidemia, unspecified: Secondary | ICD-10-CM

## 2022-01-19 DIAGNOSIS — I69359 Hemiplegia and hemiparesis following cerebral infarction affecting unspecified side: Secondary | ICD-10-CM

## 2022-01-19 DIAGNOSIS — D631 Anemia in chronic kidney disease: Secondary | ICD-10-CM

## 2022-01-19 DIAGNOSIS — I69328 Other speech and language deficits following cerebral infarction: Secondary | ICD-10-CM

## 2022-01-19 LAB — POCT GLYCOSYLATED HEMOGLOBIN (HGB A1C)
Est. average glucose Bld gHb Est-mCnc: 157
Hemoglobin A1C: 7.1 % — AB (ref 4.0–5.6)

## 2022-01-19 MED ORDER — BLOOD GLUCOSE TEST STRIPS 333 VI STRP: ORAL_STRIP | 3 refills | Status: DC

## 2022-01-19 MED ORDER — PREGABALIN 25 MG PO CAPS: 25.0000 mg | ORAL_CAPSULE | Freq: Two times a day (BID) | ORAL | 5 refills | Status: DC

## 2022-01-19 MED ORDER — BLOOD GLUCOSE METER KIT: PACK | 0 refills | Status: DC

## 2022-01-19 MED ORDER — ONETOUCH ULTRA VI STRP: ORAL_STRIP | 12 refills | Status: DC

## 2022-01-19 MED ORDER — LANCET DEVICES MISC: 3 refills | Status: DC

## 2022-01-19 MED ORDER — ONETOUCH ULTRA 2 W/DEVICE KIT: PACK | 0 refills | Status: DC

## 2022-01-19 MED ORDER — ONETOUCH DELICA LANCETS 33G MISC
12 refills | Status: DC
Start: 2022-01-19 — End: 2022-01-19

## 2022-01-19 NOTE — Assessment & Plan Note (Signed)
A1`c improving Not taking metformin again per nephrology Continue current regimen and follow-up in 6 months and repeat A1c Upcoming eye exam Foot exam today

## 2022-01-19 NOTE — Progress Notes (Signed)
I,Sulibeya S Dimas,acting as a Education administrator for Lavon Paganini, MD.,have documented all relevant documentation on the behalf of Lavon Paganini, MD,as directed by  Lavon Paganini, MD while in the presence of Lavon Paganini, MD.     Established patient visit   Patient: Kelli Gutierrez   DOB: 12-Nov-1950   71 y.o. Female  MRN: [id number] Visit Date: 01/19/2022  Today's healthcare provider: Lavon Paganini, MD   Chief Complaint  Patient presents with   Diabetes   Subjective    HPI  Diabetes Mellitus Type II, Follow-up  Lab Results  Component Value Date   HGBA1C 7.1 (A) 01/19/2022   HGBA1C 7.8 (A) 10/12/2021   HGBA1C 9.6 (H) 06/16/2021   Wt Readings from Last 3 Encounters:  01/19/22 141 lb 6.4 oz (64.1 kg)  10/15/21 150 lb (68 kg)  10/12/21 150 lb (68 kg)   Last seen for diabetes 3 months ago.  Management since then includes no changes. She reports excellent compliance with treatment. She is not having side effects.  Symptoms: Yes fatigue No foot ulcerations  No appetite changes Yes nausea  Yes paresthesia of the feet  No polydipsia  No polyuria Yes visual disturbances   No vomiting     Home blood sugar records:  not being checked Patient requesting new simple glucose monitor.  Episodes of hypoglycemia? No    Current insulin regiment: none Most Recent Eye Exam: patient reports [facility] next [facility] Current exercise: no regular exercise Current diet habits: in general, a "healthy" diet    Pertinent Labs: Lab Results  Component Value Date   CHOL 92 (L) 06/16/2021   HDL 48 06/16/2021   LDLCALC 28 06/16/2021   TRIG 80 06/16/2021   CHOLHDL 1.9 06/16/2021   Lab Results  Component Value Date   NA 139 06/16/2021   K 5.6 (H) 06/16/2021   CREATININE 2.23 (H) 06/16/2021   EGFR 23 (L) 06/16/2021   LABMICR 68.4 06/25/2021     --------------------------------------------------------------------------------------------------- Patient C/O leg  pain/stiffness on and off x several months. She is requesting to restart Lyrica to see if that will help with pain.   Ganglion cyst came back right after surgery to remove it. Not painful  Medications: Outpatient Medications Prior to Visit  Medication Sig   aspirin 81 MG chewable tablet Chew 81 mg by mouth daily.   atorvastatin (LIPITOR) 20 MG tablet Take 1 tablet by mouth once daily   clopidogrel (PLAVIX) 75 MG tablet Take 1 tablet by mouth once daily   empagliflozin (JARDIANCE) 25 MG TABS tablet Take 1 tablet (25 mg total) by mouth daily before breakfast.   glipiZIDE (GLUCOTROL) 5 MG tablet TAKE 1/2 (ONE-HALF) TABLET BY MOUTH ONCE DAILY BEFORE BREAKFAST   hydroxychloroquine (PLAQUENIL) 200 MG tablet Take 200 mg by mouth daily.   Lancet Devices MISC Use to test blood glucose as directed 1-4 times daily   metFORMIN (GLUCOPHAGE-XR) 500 MG 24 hr tablet Take by mouth.   metoprolol succinate (TOPROL-XL) 25 MG 24 hr tablet Take 1 tablet (25 mg total) by mouth daily.   patiromer (VELTASSA) 8.4 g packet Take 1 packet (8.4 g total) by mouth daily.   Semaglutide (RYBELSUS) 14 MG TABS Take 1 tablet (14 mg total) by mouth daily.   sevelamer carbonate (RENVELA) 800 MG tablet Take 1 tablet (800 mg total) by mouth 3 (three) times daily with meals.   [DISCONTINUED] glucose blood (CONTOUR NEXT TEST) test strip Use to test blood glucose as directed 1-4 times  daily (Patient not taking: Reported on 01/19/2022)   [DISCONTINUED] oxyCODONE-acetaminophen (PERCOCET) 5-325 MG tablet Take 1 tablet by mouth every 4 (four) hours as needed for severe pain. (Patient not taking: Reported on 01/19/2022)   [DISCONTINUED] pregabalin (LYRICA) 25 MG capsule Take 1 capsule by mouth twice daily (Patient not taking: Reported on 01/19/2022)   No facility-administered medications prior to visit.    Review of Systems  Constitutional:  Positive for appetite change and fatigue.  Eyes:  Positive for visual disturbance.   Respiratory:  Positive for shortness of breath.   Cardiovascular:  Negative for chest pain and palpitations.  Gastrointestinal:  Positive for abdominal pain and nausea. Negative for diarrhea and vomiting.  Musculoskeletal:  Positive for myalgias.       Objective    BP 128/76 (BP Location: Left Arm, Patient Position: Sitting, Cuff Size: Large)   Pulse 80   Temp 98 F (36.7 C) (Oral)   Resp 16   Wt 141 lb 6.4 oz (64.1 kg)   SpO2 99%   BMI 25.86 kg/m  BP Readings from Last 3 Encounters:  01/19/22 128/76  10/12/21 130/67  08/10/21 (!) 141/67   Wt Readings from Last 3 Encounters:  01/19/22 141 lb 6.4 oz (64.1 kg)  10/15/21 150 lb (68 kg)  10/12/21 150 lb (68 kg)      Physical Exam Vitals reviewed.  Constitutional:      General: She is not in acute distress.    Appearance: Normal appearance. She is well-developed. She is not diaphoretic.  HENT:     Head: Normocephalic and atraumatic.  Eyes:     General: No scleral icterus.    Conjunctiva/sclera: Conjunctivae normal.  Neck:     Thyroid: No thyromegaly.  Cardiovascular:     Rate and Rhythm: Normal rate and regular rhythm.     Pulses: Normal pulses.     Heart sounds: Normal heart sounds. No murmur heard. Pulmonary:     Effort: Pulmonary effort is normal. No respiratory distress.     Breath sounds: Normal breath sounds. No wheezing, rhonchi or rales.  Musculoskeletal:     Cervical back: Neck supple.     Right lower leg: No edema.     Left lower leg: No edema.  Lymphadenopathy:     Cervical: No cervical adenopathy.  Skin:    General: Skin is warm and dry.     Findings: No rash.  Neurological:     Mental Status: She is alert and oriented to person, place, and time. Mental status is at baseline.  Psychiatric:        Mood and Affect: Mood normal.        Behavior: Behavior normal.     Diabetic Foot Exam - Simple   Simple Foot Form Diabetic Foot exam was performed with the following findings: Yes 01/19/2022 10:58  AM  Visual Inspection No deformities, no ulcerations, no other skin breakdown bilaterally: Yes Sensation Testing Intact to touch and monofilament testing bilaterally: Yes Pulse Check Posterior Tibialis and Dorsalis pulse intact bilaterally: Yes Comments      Results for orders placed or performed in visit on 01/19/22  POCT glycosylated hemoglobin (Hb A1C)  Result Value Ref Range   Hemoglobin A1C 7.1 (A) 4.0 - 5.6 %   Est. average glucose Bld gHb Est-mCnc 157     Assessment & Plan     Problem List Items Addressed This Visit       Cardiovascular and Mediastinum   Hypertension associated with diabetes ([place])  Well controlled Continue current medications Reviewed recent metabolic panel          Endocrine   Type 2 diabetes mellitus with hyperglycemia, without long-term current use of insulin (HCC) - Primary    A1`c improving Not taking metformin again per nephrology Continue current regimen and follow-up in 6 months and repeat A1c Upcoming eye exam Foot exam today      Relevant Orders   POCT glycosylated hemoglobin (Hb A1C) (Completed)   Hyperlipidemia associated with type 2 diabetes mellitus (HCC)    Chronic and well-controlled No changes Repeat lipid panel at next visit        Nervous and Auditory   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)    Chronic and stable right weakness and rigidity        Musculoskeletal and Integument   Rheumatoid arthritis (Chronic)    Chronic and stable Followed by rheumatology Continue Plaquenil Continue regular eye exams        Genitourinary   CKD (chronic kidney disease) stage 4, GFR 15-29 ml/min (HCC)    Improved on last labs Continue to maintain good control of DM and HTN Followed by nephrology      Anemia in stage 4 chronic kidney disease ([place])    Well-controlled on recent labs Followed by nephrology        Other   Current moderate episode of major depressive disorder  without prior episode ([place])    Chronic and stable Declines medications        Return in about 6 months (around 07/21/2022) for chronic disease f/u.      I, Lavon Paganini, MD, have reviewed all documentation for this visit. The documentation on 01/19/22 for the exam, diagnosis, procedures, and orders are all accurate and complete.   Mirjana Tarleton, Dionne Bucy, MD, MPH [facility]

## 2022-01-19 NOTE — Assessment & Plan Note (Signed)
Chronic and stable right weakness and rigidity

## 2022-01-19 NOTE — Assessment & Plan Note (Signed)
Chronic and stable Declines medications

## 2022-01-19 NOTE — Assessment & Plan Note (Signed)
Well-controlled on recent labs Followed by nephrology

## 2022-01-19 NOTE — Assessment & Plan Note (Signed)
Chronic and well-controlled No changes Repeat lipid panel at next visit

## 2022-01-19 NOTE — Assessment & Plan Note (Signed)
Improved on last labs Continue to maintain good control of DM and HTN Followed by nephrology

## 2022-01-19 NOTE — Assessment & Plan Note (Signed)
Well controlled Continue current medications Reviewed recent metabolic panel 

## 2022-01-19 NOTE — Assessment & Plan Note (Signed)
Chronic and stable Followed by rheumatology Continue Plaquenil Continue regular eye exams

## 2022-01-21 ENCOUNTER — Ambulatory Visit (INDEPENDENT_AMBULATORY_CARE_PROVIDER_SITE_OTHER): Payer: Medicare Other | Admitting: Family Medicine

## 2022-01-21 DIAGNOSIS — Z23 Encounter for immunization: Secondary | ICD-10-CM | POA: Diagnosis not present

## 2022-01-21 NOTE — Progress Notes (Signed)
Patient here for flu vaccination only.  I did not examine the patient.  I did review his medical history, medications, and allergies and vaccine consent form.  CMA gave vaccination. Patient tolerated well.  Virginia Crews, MD, MPH [facility] 01/21/2022 11:55 AM

## 2022-02-04 ENCOUNTER — Other Ambulatory Visit: Payer: Self-pay | Admitting: Family Medicine

## 2022-02-24 LAB — HM DIABETES EYE EXAM

## 2022-03-10 ENCOUNTER — Other Ambulatory Visit: Payer: Self-pay | Admitting: Family Medicine

## 2022-03-19 ENCOUNTER — Other Ambulatory Visit: Payer: Self-pay | Admitting: Family Medicine

## 2022-03-19 NOTE — Telephone Encounter (Signed)
Requested Prescriptions  Pending Prescriptions Disp Refills   glipiZIDE (GLUCOTROL) 5 MG tablet [Pharmacy Med Name: glipiZIDE 5 MG Oral Tablet] 45 tablet 0    Sig: TAKE 1/2 (ONE-HALF) TABLET BY MOUTH ONCE DAILY BEFORE BREAKFAST     Endocrinology:  Diabetes - Sulfonylureas Failed - 03/19/2022 11:11 AM      Failed - Cr in normal range and within 360 days    Creatinine  Date Value Ref Range Status  10/10/2012 0.93 0.60 - 1.30 mg/dL Final   Creatinine, Ser  Date Value Ref Range Status  06/16/2021 2.23 (H) 0.57 - 1.00 mg/dL Final         Passed - HBA1C is between 0 and 7.9 and within 180 days    Hemoglobin A1C  Date Value Ref Range Status  01/19/2022 7.1 (A) 4.0 - 5.6 % Final  12/26/2020 7.7  Final   Hgb A1c MFr Bld  Date Value Ref Range Status  06/16/2021 9.6 (H) 4.8 - 5.6 % Final    Comment:             Prediabetes: 5.7 - 6.4          Diabetes: >6.4          Glycemic control for adults with diabetes: <7.0          Passed - Valid encounter within last 6 months    Recent Outpatient Visits           1 month ago Need for immunization against influenza   [facility] [place], Dionne Bucy, MD   1 month ago Type 2 diabetes mellitus with hyperglycemia, without long-term current use of insulin [facility])   [facility] [place], Dionne Bucy, MD   5 months ago Type 2 diabetes mellitus with hyperglycemia, without long-term current use of insulin [facility])   [facility] My[facility] Gip, DO   7 months ago Type 2 diabetes mellitus with hyperglycemia, without long-term current use of insulin [facility])   [facility] [place], Dionne Bucy, MD   8 months ago Type 2 diabetes mellitus with hyperglycemia, without long-term current use of insulin [facility])   [facility] [place], Dionne Bucy, MD       Future Appointments             In 4 months Bacigalupo, Dionne Bucy, MD [facility]

## 2022-04-16 ENCOUNTER — Ambulatory Visit (INDEPENDENT_AMBULATORY_CARE_PROVIDER_SITE_OTHER): Payer: Medicare Other | Admitting: Family Medicine

## 2022-04-16 ENCOUNTER — Encounter: Payer: Self-pay | Admitting: Physician Assistant

## 2022-04-16 VITALS — BP 106/68 | HR 91 | Temp 98.3°F | Resp 16

## 2022-04-16 DIAGNOSIS — U071 COVID-19: Secondary | ICD-10-CM

## 2022-04-16 DIAGNOSIS — Z20822 Contact with and (suspected) exposure to covid-19: Secondary | ICD-10-CM

## 2022-04-16 DIAGNOSIS — R040 Epistaxis: Secondary | ICD-10-CM | POA: Diagnosis not present

## 2022-04-16 LAB — POC COVID19 BINAXNOW: SARS Coronavirus 2 Ag: POSITIVE — AB

## 2022-04-16 LAB — POCT INFLUENZA A/B
Influenza A, POC: NEGATIVE
Influenza B, POC: NEGATIVE

## 2022-04-16 MED ORDER — MOLNUPIRAVIR EUA 200MG CAPSULE: 4.0000 | ORAL_CAPSULE | Freq: Two times a day (BID) | ORAL | 0 refills | Status: AC

## 2022-04-16 NOTE — Progress Notes (Signed)
I,Sulibeya S Dimas,acting as a Education administrator for Lavon Paganini, MD.,have documented all relevant documentation on the behalf of Lavon Paganini, MD,as directed by  Lavon Paganini, MD while in the presence of Lavon Paganini, MD.     Established patient visit   Patient: Kelli Gutierrez   DOB: [birth year]   72 y.o. Female  MRN: [id number] Visit Date: 04/16/2022  Today's healthcare provider: Lavon Paganini, MD   Chief Complaint  Patient presents with   Covid Positive   Subjective    HPI  Upper respiratory symptoms She complains of bilateral ear pressure/pain, achiness, congestion, fever  , headache described as pressure, nasal congestion, non productive cough, sinus pressure, sneezing, and sore throat.with fever temp not checked. Onset of symptoms was yesterday and gradually worsening.She is drinking plenty of fluids.  Past history is significant for no history of pneumonia or bronchitis. Patient is non-smoker   Intermittent nosebleeds with cold whether  ---------------------------------------------------------------------------------------------------   Medications: Outpatient Medications Prior to Visit  Medication Sig   aspirin 81 MG chewable tablet Chew 81 mg by mouth daily.   atorvastatin (LIPITOR) 20 MG tablet Take 1 tablet by mouth once daily   blood glucose meter kit and supplies Accu-Chek Guide Me meter. To check blood sugar once daily. Dx: E11.65   clopidogrel (PLAVIX) 75 MG tablet Take 1 tablet by mouth once daily   glipiZIDE (GLUCOTROL) 5 MG tablet TAKE 1/2 (ONE-HALF) TABLET BY MOUTH ONCE DAILY BEFORE BREAKFAST   Glucose Blood (BLOOD GLUCOSE TEST STRIPS 333) STRP To check blood sugar daily. Dx: E11.65   hydroxychloroquine (PLAQUENIL) 200 MG tablet Take 200 mg by mouth daily.   JARDIANCE 25 MG TABS tablet TAKE 1 TABLET BY MOUTH ONCE DAILY BEFORE BREAKFAST   Lancet Devices MISC Use to test blood glucose once daily. Dx: E11.65   metFORMIN (GLUCOPHAGE-XR) 500 MG 24 hr  tablet Take by mouth.   metoprolol succinate (TOPROL-XL) 25 MG 24 hr tablet Take 1 tablet (25 mg total) by mouth daily.   patiromer (VELTASSA) 8.4 g packet Take 1 packet (8.4 g total) by mouth daily.   pregabalin (LYRICA) 25 MG capsule Take 1 capsule (25 mg total) by mouth 2 (two) times daily.   Semaglutide (RYBELSUS) 14 MG TABS Take 1 tablet by mouth once daily   No facility-administered medications prior to visit.    Review of Systems  Constitutional:  Positive for activity change, appetite change, chills, fatigue and fever.  HENT:  Positive for congestion, ear pain, postnasal drip, rhinorrhea, sinus pressure, sinus pain, sneezing and sore throat.   Respiratory:  Positive for cough, chest tightness and shortness of breath.        Objective    BP 106/68 (BP Location: Left Arm, Patient Position: Sitting, Cuff Size: Large)   Pulse 91   Temp 98.3 F (36.8 C) (Oral)   Resp 16   SpO2 96%    Physical Exam Vitals reviewed.  Constitutional:      General: She is not in acute distress.    Appearance: Normal appearance. She is well-developed. She is not diaphoretic.  HENT:     Head: Normocephalic and atraumatic.  Eyes:     General: No scleral icterus.    Conjunctiva/sclera: Conjunctivae normal.  Neck:     Thyroid: No thyromegaly.  Cardiovascular:     Rate and Rhythm: Normal rate and regular rhythm.     Heart sounds: Normal heart sounds. No murmur heard. Pulmonary:     Effort: Pulmonary effort is normal. No respiratory  distress.     Breath sounds: Normal breath sounds. No wheezing, rhonchi or rales.  Musculoskeletal:     Cervical back: Neck supple.     Right lower leg: No edema.     Left lower leg: No edema.  Lymphadenopathy:     Cervical: No cervical adenopathy.  Skin:    General: Skin is warm and dry.     Findings: No rash.  Neurological:     Mental Status: She is alert and oriented to person, place, and time. Mental status is at baseline.  Psychiatric:        Mood and  Affect: Mood normal.        Behavior: Behavior normal.       Results for orders placed or performed in visit on 04/16/22  POC COVID-19  Result Value Ref Range   SARS Coronavirus 2 Ag Positive (A) Negative  POCT Influenza A/B  Result Value Ref Range   Influenza A, POC Negative Negative   Influenza B, POC Negative Negative    Assessment & Plan     1. COVID 2. Exposure to confirmed case of COVID-19 - symptoms c/w COVID 19 and POC test positive - no evidence of strep pharyngitis, CAP, AOM, bacterial sinusitis, or other bacterial infection - discussed need to quarantine 5-10 days from start of symptoms and until fever-free for at least 24 hours - discussed symptomatic management, natural course, and return precautions   - molnupiravir treatment (CKD and med interactions with paxlovid) - POC COVID-19 - POCT Influenza A/B  3. Nosebleed - encourage humidifier use   Return if symptoms worsen or fail to improve.      I, Lavon Paganini, MD, have reviewed all documentation for this visit. The documentation on 04/16/22 for the exam, diagnosis, procedures, and orders are all accurate and complete.   Amandeep Hogston, Dionne Bucy, MD, MPH [facility]

## 2022-04-19 ENCOUNTER — Other Ambulatory Visit: Payer: Self-pay | Admitting: Family Medicine

## 2022-04-20 NOTE — Telephone Encounter (Signed)
Requested Prescriptions  Pending Prescriptions Disp Refills   metoprolol succinate (TOPROL-XL) 25 MG 24 hr tablet [Pharmacy Med Name: Metoprolol Succinate ER 25 MG Oral Tablet Extended Release 24 Hour] 90 tablet 0    Sig: Take 1 tablet by mouth once daily     Cardiovascular:  Beta Blockers Passed - 04/19/2022 11:23 AM      Passed - Last BP in normal range    BP Readings from Last 1 Encounters:  04/16/22 106/68         Passed - Last Heart Rate in normal range    Pulse Readings from Last 1 Encounters:  04/16/22 91         Passed - Valid encounter within last 6 months    Recent Outpatient Visits           4 days ago COVID   [facility], Dionne Bucy, MD   2 months ago Need for immunization against influenza   [facility] [place], Dionne Bucy, MD   3 months ago Type 2 diabetes mellitus with hyperglycemia, without long-term current use of insulin [facility])   [place], Dionne Bucy, MD   6 months ago Type 2 diabetes mellitus with hyperglycemia, without long-term current use of insulin [facility])   [place], DO   9 months ago Type 2 diabetes mellitus with hyperglycemia, without long-term current use of insulin [facility])   [facility] Bacigal[place], Dionne Bucy, MD       Future Appointments             In 3 months Bacigalupo, Dionne Bucy, MD [facility]

## 2022-04-26 ENCOUNTER — Other Ambulatory Visit: Payer: Self-pay | Admitting: Family Medicine

## 2022-04-26 DIAGNOSIS — Z8673 Personal history of transient ischemic attack (TIA), and cerebral infarction without residual deficits: Secondary | ICD-10-CM

## 2022-05-06 ENCOUNTER — Other Ambulatory Visit: Payer: Self-pay | Admitting: Family Medicine

## 2022-05-06 NOTE — Telephone Encounter (Signed)
Last labs 06/16/21. Future visit in 2 months. Requested Prescriptions  Pending Prescriptions Disp Refills   JARDIANCE 25 MG TABS tablet [Pharmacy Med Name: Jardiance 25 MG Oral Tablet] 90 tablet 0    Sig: TAKE 1 TABLET BY MOUTH ONCE DAILY BEFORE BREAKFAST     Endocrinology:  Diabetes - SGLT2 Inhibitors Failed - 05/06/2022 12:12 PM      Failed - Cr in normal range and within 360 days    Creatinine  Date Value Ref Range Status  10/10/2012 0.93 0.60 - 1.30 mg/dL Final   Creatinine, Ser  Date Value Ref Range Status  06/16/2021 2.23 (H) 0.57 - 1.00 mg/dL Final         Failed - eGFR in normal range and within 360 days    EGFR (African American)  Date Value Ref Range Status  10/10/2012 >60  Final   GFR calc Af Amer  Date Value Ref Range Status  03/13/2020 25 (L) >59 mL/min/1.73 Final    Comment:    **In accordance with recommendations from the NKF-ASN Task force,**   [facility] is in the process of updating its eGFR calculation to the   2021 CKD-EPI creatinine equation that estimates kidney function   without a race variable.    EGFR (Non-African Amer.)  Date Value Ref Range Status  10/10/2012 >60  Final    Comment:    eGFR values <35mL/min/1.73 m2 may be an indication of chronic kidney disease (CKD). Calculated eGFR is useful in patients with stable renal function. The eGFR calculation will not be reliable in acutely ill patients when serum creatinine is changing rapidly. It is not useful in  patients on dialysis. The eGFR calculation may not be applicable to patients at the low and high extremes of body sizes, pregnant women, and vegetarians.    GFR calc non Af Amer  Date Value Ref Range Status  03/13/2020 22 (L) >59 mL/min/1.73 Final   eGFR  Date Value Ref Range Status  06/16/2021 23 (L) >59 mL/min/1.73 Final         Passed - HBA1C is between 0 and 7.9 and within 180 days    Hemoglobin A1C  Date Value Ref Range Status  01/19/2022 7.1 (A) 4.0 - 5.6 % Final  12/26/2020  7.7  Final   Hgb A1c MFr Bld  Date Value Ref Range Status  06/16/2021 9.6 (H) 4.8 - 5.6 % Final    Comment:             Prediabetes: 5.7 - 6.4          Diabetes: >6.4          Glycemic control for adults with diabetes: <7.0          Passed - Valid encounter within last 6 months    Recent Outpatient Visits           2 weeks ago [place], Dionne Bucy, MD   3 months ago Need for immunization against influenza   [place], Dionne Bucy, MD   3 months ago Type 2 diabetes mellitus with hyperglycemia, without long-term current use of insulin [facility])   [place], Dionne Bucy, MD   6 months ago Type 2 diabetes mellitus with hyperglycemia, without long-term current use of insulin [facility])   [place] Percy M, DO   9 months ago Type 2 diabetes mellitus with hyperglycemia, without long-term current  use of insulin [facility])   [place], Dionne Bucy, MD       Future Appointments             In 2 months Bacigalupo, Dionne Bucy, MD [facility]

## 2022-06-09 ENCOUNTER — Other Ambulatory Visit: Payer: Self-pay | Admitting: Family Medicine

## 2022-06-14 NOTE — Progress Notes (Unsigned)
I,Tarhonda Hollenberg S Helene Bernstein,acting as a Education administrator for Lavon Paganini, MD.,have documented all relevant documentation on the behalf of Lavon Paganini, MD,as directed by  Lavon Paganini, MD while in the presence of Lavon Paganini, MD.     Established patient visit   Patient: Kelli Gutierrez   DOB: 1950/08/10   72 y.o. Female  MRN: [id number] Visit Date: 06/15/2022  Today's healthcare provider: Lavon Paganini, MD   Chief Complaint  Patient presents with   Follow-up   Subjective    HPI  Follow up for medication  The patient was last seen for this 1 months ago by rheumatologist. Dr. Posey Pronto at [facility]. Changes made at last visit include start Tramadol 50 mg daily as needed for pain.  Patient stopped taking Metformin.   She reports fair compliance with treatment. She feels that condition is Unchanged. She is not having side effects.    Sinus congestion with thick green drainage and pressure and post nasal drip x several weeks -----------------------------------------------------------------------------------------   Medications: Outpatient Medications Prior to Visit  Medication Sig   aspirin 81 MG chewable tablet Chew 81 mg by mouth daily.   atorvastatin (LIPITOR) 20 MG tablet Take 1 tablet by mouth once daily   blood glucose meter kit and supplies Accu-Chek Guide Me meter. To check blood sugar once daily. Dx: E11.65   clopidogrel (PLAVIX) 75 MG tablet Take 1 tablet by mouth once daily   Glucose Blood (BLOOD GLUCOSE TEST STRIPS 333) STRP To check blood sugar daily. Dx: E11.65   hydroxychloroquine (PLAQUENIL) 200 MG tablet Take 200 mg by mouth daily.   JARDIANCE 25 MG TABS tablet TAKE 1 TABLET BY MOUTH ONCE DAILY BEFORE BREAKFAST   Lancet Devices MISC Use to test blood glucose once daily. Dx: E11.65   metoprolol succinate (TOPROL-XL) 25 MG 24 hr tablet Take 1 tablet by mouth once daily   patiromer (VELTASSA) 8.4 g packet Take 1 packet (8.4 g total) by mouth daily.    pregabalin (LYRICA) 25 MG capsule Take 1 capsule (25 mg total) by mouth 2 (two) times daily.   Semaglutide (RYBELSUS) 14 MG TABS Take 1 tablet by mouth once daily   traMADol (ULTRAM) 50 MG tablet Take 50 mg by mouth every 6 (six) hours as needed for severe pain.   [DISCONTINUED] glipiZIDE (GLUCOTROL) 5 MG tablet TAKE 1/2 (ONE-HALF) TABLET BY MOUTH ONCE DAILY BEFORE BREAKFAST   [DISCONTINUED] metFORMIN (GLUCOPHAGE-XR) 500 MG 24 hr tablet Take by mouth. (Patient not taking: Reported on 06/15/2022)   No facility-administered medications prior to visit.    Review of Systems     Objective    BP 115/78 (BP Location: Left Arm, Patient Position: Sitting, Cuff Size: Normal)   Pulse 76   Temp 98.2 F (36.8 C) (Temporal)   Resp 12   Ht '5\' 2"'$  (1.575 m)   Wt 137 lb 8 oz (62.4 kg)   SpO2 99%   BMI 25.15 kg/m    Physical Exam Vitals reviewed.  Constitutional:      General: She is not in acute distress.    Appearance: Normal appearance. She is well-developed. She is not diaphoretic.  HENT:     Head: Normocephalic and atraumatic.     Right Ear: Tympanic membrane, ear canal and external ear normal.     Left Ear: Tympanic membrane, ear canal and external ear normal.     Nose: Congestion present.     Comments: Sinus TTP    Mouth/Throat:     Mouth: Mucous membranes  are moist.     Pharynx: Oropharynx is clear. No oropharyngeal exudate.  Eyes:     General: No scleral icterus.    Conjunctiva/sclera: Conjunctivae normal.     Pupils: Pupils are equal, round, and reactive to light.  Neck:     Thyroid: No thyromegaly.  Cardiovascular:     Rate and Rhythm: Normal rate and regular rhythm.     Pulses: Normal pulses.     Heart sounds: Normal heart sounds. No murmur heard. Pulmonary:     Effort: Pulmonary effort is normal. No respiratory distress.     Breath sounds: Normal breath sounds. No wheezing or rales.  Abdominal:     General: There is no distension.     Palpations: Abdomen is soft.      Tenderness: There is no abdominal tenderness.  Musculoskeletal:     Cervical back: Neck supple.     Right lower leg: No edema.     Left lower leg: No edema.  Lymphadenopathy:     Cervical: No cervical adenopathy.  Skin:    General: Skin is warm and dry.     Findings: No rash.  Neurological:     Mental Status: She is alert and oriented to person, place, and time. Mental status is at baseline.       No results found for any visits on 06/15/22.  Assessment & Plan     Problem List Items Addressed This Visit       Respiratory   Acute non-recurrent frontal sinusitis    - symptoms and exam c/w sinusitis   - no evidence of AOM, CAP, strep pharyngitis, or other infection - given duration of symptoms, suspect bacterial etiology - will treat with augmentin x7d - discussed symptomatic management (flonase, decongestants, etc), natural course, and return precautions        Relevant Medications   amoxicillin-clavulanate (AUGMENTIN) 875-125 MG tablet     Endocrine   Type 2 diabetes mellitus with hyperglycemia, without long-term current use of insulin (HCC) - Primary    Metformin was stopped by Rheum as a possible culprit for LFT elevation Having increased BG without metformin Ok to increase glipizide to '5mg'$  from 2.'5mg'$  daily Discussed improtance of always eating after taking glipizide to avoid hypoglycemia      Relevant Medications   glipiZIDE (GLUCOTROL) 5 MG tablet     Other   Constipation    Ok to use miralax prn      LFT elevation    Undergoing workup with Rheum and referred to GI Patient was concerned about equivocal Hep C screen, but HCV RNA negative Reassured patient that she does not have hepatitis C        Return for as scheduled.      I, Lavon Paganini, MD, have reviewed all documentation for this visit. The documentation on 06/15/22 for the exam, diagnosis, procedures, and orders are all accurate and complete.   Bacigalupo, Dionne Bucy, MD, MPH [facility]

## 2022-06-15 ENCOUNTER — Encounter: Payer: Self-pay | Admitting: Family Medicine

## 2022-06-15 ENCOUNTER — Ambulatory Visit (INDEPENDENT_AMBULATORY_CARE_PROVIDER_SITE_OTHER): Payer: Medicare Other | Admitting: Family Medicine

## 2022-06-15 VITALS — BP 115/78 | HR 76 | Temp 98.2°F | Resp 12 | Ht 62.0 in | Wt 137.5 lb

## 2022-06-15 DIAGNOSIS — K5900 Constipation, unspecified: Secondary | ICD-10-CM | POA: Insufficient documentation

## 2022-06-15 DIAGNOSIS — J0110 Acute frontal sinusitis, unspecified: Secondary | ICD-10-CM | POA: Diagnosis not present

## 2022-06-15 DIAGNOSIS — E1165 Type 2 diabetes mellitus with hyperglycemia: Secondary | ICD-10-CM

## 2022-06-15 DIAGNOSIS — R7989 Other specified abnormal findings of blood chemistry: Secondary | ICD-10-CM | POA: Insufficient documentation

## 2022-06-15 MED ORDER — GLIPIZIDE 5 MG PO TABS: 5.0000 mg | ORAL_TABLET | Freq: Every day | ORAL | 1 refills | Status: DC

## 2022-06-15 MED ORDER — AMOXICILLIN-POT CLAVULANATE 875-125 MG PO TABS: 1.0000 | ORAL_TABLET | Freq: Two times a day (BID) | ORAL | 0 refills | Status: AC

## 2022-06-15 NOTE — Assessment & Plan Note (Signed)
Metformin was stopped by Rheum as a possible culprit for LFT elevation Having increased BG without metformin Ok to increase glipizide to '5mg'$  from 2.'5mg'$  daily Discussed improtance of always eating after taking glipizide to avoid hypoglycemia

## 2022-06-15 NOTE — Assessment & Plan Note (Signed)
-   symptoms and exam c/w sinusitis   - no evidence of AOM, CAP, strep pharyngitis, or other infection - given duration of symptoms, suspect bacterial etiology - will treat with augmentin x7d - discussed symptomatic management (flonase, decongestants, etc), natural course, and return precautions

## 2022-06-15 NOTE — Assessment & Plan Note (Signed)
Ok to use miralax prn

## 2022-06-15 NOTE — Assessment & Plan Note (Signed)
Undergoing workup with Rheum and referred to GI Patient was concerned about equivocal Hep C screen, but HCV RNA negative Reassured patient that she does not have hepatitis C

## 2022-06-17 ENCOUNTER — Other Ambulatory Visit: Payer: Self-pay | Admitting: Rheumatology

## 2022-06-17 DIAGNOSIS — R7989 Other specified abnormal findings of blood chemistry: Secondary | ICD-10-CM

## 2022-06-28 ENCOUNTER — Other Ambulatory Visit: Payer: Self-pay | Admitting: Family Medicine

## 2022-06-28 DIAGNOSIS — E785 Hyperlipidemia, unspecified: Secondary | ICD-10-CM

## 2022-06-29 NOTE — Telephone Encounter (Signed)
Requested Prescriptions  Pending Prescriptions Disp Refills   atorvastatin (LIPITOR) 20 MG tablet [Pharmacy Med Name: Atorvastatin Calcium 20 MG Oral Tablet] 90 tablet 0    Sig: Take 1 tablet by mouth once daily     Cardiovascular:  Antilipid - Statins Failed - 06/28/2022  5:07 PM      Failed - Lipid Panel in normal range within the last 12 months    Cholesterol, Total  Date Value Ref Range Status  06/16/2021 92 (L) 100 - 199 mg/dL Final   LDL Chol Calc (NIH)  Date Value Ref Range Status  06/16/2021 28 0 - 99 mg/dL Final   HDL  Date Value Ref Range Status  06/16/2021 48 >39 mg/dL Final   Triglycerides  Date Value Ref Range Status  06/16/2021 80 0 - 149 mg/dL Final         Passed - Patient is not pregnant      Passed - Valid encounter within last 12 months    Recent Outpatient Visits           2 weeks ago Type 2 diabetes mellitus with hyperglycemia, without long-term current use of insulin ([place])   [place], Dionne Bucy, MD   2 months ago [place], Dionne Bucy, MD   5 months ago Need for immunization against influenza   [place], Dionne Bucy, MD   5 months ago Type 2 diabetes mellitus with hyperglycemia, without long-term current use of insulin [facility])   [place], Dionne Bucy, MD   8 months ago Type 2 diabetes mellitus with hyperglycemia, without long-term current use of insulin [facility])   [place], Jake Church, DO       Future Appointments             In 3 weeks Bacigalupo, Dionne Bucy, MD [facility]

## 2022-07-05 ENCOUNTER — Ambulatory Visit
Admission: RE | Admit: 2022-07-05 | Discharge: 2022-07-05 | Disposition: A | Discharge status: Home / Self Care | Payer: Medicare Other | Source: Ambulatory Visit | Attending: Rheumatology | Admitting: Rheumatology

## 2022-07-05 DIAGNOSIS — R7989 Other specified abnormal findings of blood chemistry: Secondary | ICD-10-CM

## 2022-07-13 LAB — HEMOGLOBIN A1C: Hemoglobin A1C: 9.6

## 2022-07-13 LAB — PROTEIN / CREATININE RATIO, URINE: Creatinine, Urine: 38.2

## 2022-07-13 LAB — BASIC METABOLIC PANEL
BUN: 38 — AB (ref 4–21)
CO2: 24 — AB (ref 13–22)
Chloride: 109 — AB (ref 99–108)
Creatinine: 1.8 — AB (ref 0.5–1.1)
Glucose: 185
Potassium: 4.7 mEq/L (ref 3.5–5.1)
Sodium: 137 (ref 137–147)

## 2022-07-13 LAB — CBC: RBC: 4.13 (ref 3.87–5.11)

## 2022-07-13 LAB — CBC AND DIFFERENTIAL
HCT: 36 (ref 36–46)
Hemoglobin: 12 (ref 12.0–16.0)
Neutrophils Absolute: 3.4
Platelets: 173 10*3/uL (ref 150–400)
WBC: 6.1

## 2022-07-13 LAB — MICROALBUMIN / CREATININE URINE RATIO: Microalb Creat Ratio: 306.3

## 2022-07-13 LAB — VITAMIN D 25 HYDROXY (VIT D DEFICIENCY, FRACTURES): Vit D, 25-Hydroxy: 39.8

## 2022-07-13 LAB — COMPREHENSIVE METABOLIC PANEL
Calcium: 9.2 (ref 8.7–10.7)
eGFR: 30

## 2022-07-19 ENCOUNTER — Other Ambulatory Visit: Payer: Self-pay | Admitting: Family Medicine

## 2022-07-21 ENCOUNTER — Encounter: Payer: Self-pay | Admitting: Family Medicine

## 2022-07-21 NOTE — Progress Notes (Deleted)
      Established patient visit   Patient: Kelli Gutierrez   DOB: Jan 06, 1951   72 y.o. Female  MRN: [id number] Visit Date: 07/22/2022  Today's healthcare provider: Shirlee Latch, MD   No chief complaint on file.  Subjective    HPI  Diabetes Mellitus Type II, Follow-up  Lab Results  Component Value Date   HGBA1C 7.1 (A) 01/19/2022   HGBA1C 7.8 (A) 10/12/2021   HGBA1C 9.6 (H) 06/16/2021   Wt Readings from Last 3 Encounters:  06/15/22 137 lb 8 oz (62.4 kg)  01/19/22 141 lb 6.4 oz (64.1 kg)  10/15/21 150 lb (68 kg)   Last seen for diabetes 2 weeks ago.  Management since then includes increase glipizide to 5mg  from 2.5mg  daily   Home blood sugar records: {diabetes glucometry results:16657}  Episodes of hypoglycemia? {Yes/No:20286} {enter symptoms and frequency of symptoms if yes:1}   Pertinent Labs: Lab Results  Component Value Date   CHOL 92 (L) 06/16/2021   HDL 48 06/16/2021   LDLCALC 28 06/16/2021   TRIG 80 06/16/2021   CHOLHDL 1.9 06/16/2021   Lab Results  Component Value Date   NA 139 06/16/2021   K 5.6 (H) 06/16/2021   CREATININE 2.23 (H) 06/16/2021   EGFR 23 (L) 06/16/2021   LABMICR 68.4 06/25/2021   MICRALBCREAT 135 (H) 06/25/2021     ---------------------------------------------------------------------------------------------------   Medications: Outpatient Medications Prior to Visit  Medication Sig   aspirin 81 MG chewable tablet Chew 81 mg by mouth daily.   atorvastatin (LIPITOR) 20 MG tablet Take 1 tablet by mouth once daily   blood glucose meter kit and supplies Accu-Chek Guide Me meter. To check blood sugar once daily. Dx: E11.65   clopidogrel (PLAVIX) 75 MG tablet Take 1 tablet by mouth once daily   glipiZIDE (GLUCOTROL) 5 MG tablet Take 1 tablet (5 mg total) by mouth daily before breakfast.   Glucose Blood (BLOOD GLUCOSE TEST STRIPS 333) STRP To check blood sugar daily. Dx: E11.65   hydroxychloroquine (PLAQUENIL) 200 MG tablet Take 200 mg by  mouth daily.   JARDIANCE 25 MG TABS tablet TAKE 1 TABLET BY MOUTH ONCE DAILY BEFORE BREAKFAST   Lancet Devices MISC Use to test blood glucose once daily. Dx: E11.65   metoprolol succinate (TOPROL-XL) 25 MG 24 hr tablet Take 1 tablet by mouth once daily   patiromer (VELTASSA) 8.4 g packet Take 1 packet (8.4 g total) by mouth daily.   pregabalin (LYRICA) 25 MG capsule Take 1 capsule (25 mg total) by mouth 2 (two) times daily.   Semaglutide (RYBELSUS) 14 MG TABS Take 1 tablet by mouth once daily   traMADol (ULTRAM) 50 MG tablet Take 50 mg by mouth every 6 (six) hours as needed for severe pain.   No facility-administered medications prior to visit.    Review of Systems  {Labs  Heme  Chem  Endocrine  Serology  Results Review (optional):23779}   Objective    There were no vitals taken for this visit. {Show previous vital signs (optional):23777}  Physical Exam  ***  No results found for any visits on 07/22/22.  Assessment & Plan     ***  No follow-ups on file.      {provider attestation***:1}   Shirlee Latch, MD  [facility] [phone] (phone) [phone] (fax)  [facility]

## 2022-07-22 ENCOUNTER — Encounter: Payer: Self-pay | Admitting: Family Medicine

## 2022-07-22 ENCOUNTER — Ambulatory Visit (INDEPENDENT_AMBULATORY_CARE_PROVIDER_SITE_OTHER): Payer: Medicare Other | Admitting: Family Medicine

## 2022-07-22 VITALS — BP 113/67 | HR 63 | Temp 97.6°F | Resp 16 | Wt 137.4 lb

## 2022-07-22 DIAGNOSIS — D631 Anemia in chronic kidney disease: Secondary | ICD-10-CM

## 2022-07-22 DIAGNOSIS — E1165 Type 2 diabetes mellitus with hyperglycemia: Secondary | ICD-10-CM

## 2022-07-22 DIAGNOSIS — E1142 Type 2 diabetes mellitus with diabetic polyneuropathy: Secondary | ICD-10-CM | POA: Diagnosis not present

## 2022-07-22 DIAGNOSIS — E1169 Type 2 diabetes mellitus with other specified complication: Secondary | ICD-10-CM | POA: Diagnosis not present

## 2022-07-22 DIAGNOSIS — I152 Hypertension secondary to endocrine disorders: Secondary | ICD-10-CM

## 2022-07-22 DIAGNOSIS — E785 Hyperlipidemia, unspecified: Secondary | ICD-10-CM

## 2022-07-22 DIAGNOSIS — E1159 Type 2 diabetes mellitus with other circulatory complications: Secondary | ICD-10-CM | POA: Diagnosis not present

## 2022-07-22 DIAGNOSIS — I69328 Other speech and language deficits following cerebral infarction: Secondary | ICD-10-CM

## 2022-07-22 DIAGNOSIS — N184 Chronic kidney disease, stage 4 (severe): Secondary | ICD-10-CM

## 2022-07-22 DIAGNOSIS — M069 Rheumatoid arthritis, unspecified: Secondary | ICD-10-CM

## 2022-07-22 DIAGNOSIS — I69359 Hemiplegia and hemiparesis following cerebral infarction affecting unspecified side: Secondary | ICD-10-CM

## 2022-07-22 DIAGNOSIS — N898 Other specified noninflammatory disorders of vagina: Secondary | ICD-10-CM

## 2022-07-22 DIAGNOSIS — J449 Chronic obstructive pulmonary disease, unspecified: Secondary | ICD-10-CM

## 2022-07-22 DIAGNOSIS — F321 Major depressive disorder, single episode, moderate: Secondary | ICD-10-CM

## 2022-07-22 LAB — POCT GLYCOSYLATED HEMOGLOBIN (HGB A1C)
Est. average glucose Bld gHb Est-mCnc: 237
Hemoglobin A1C: 9.9 % — AB (ref 4.0–5.6)

## 2022-07-22 MED ORDER — GLIPIZIDE 5 MG PO TABS: 5.0000 mg | ORAL_TABLET | Freq: Two times a day (BID) | ORAL | 1 refills | Status: DC

## 2022-07-22 MED ORDER — FLUCONAZOLE 150 MG PO TABS: 150.0000 mg | ORAL_TABLET | Freq: Once | ORAL | 0 refills | Status: AC

## 2022-07-22 NOTE — Assessment & Plan Note (Signed)
Chronic and stable.  Continue current medication regimen. 

## 2022-07-22 NOTE — Assessment & Plan Note (Signed)
Chronic and uncontrolled.  Increase glipizide to 5 mg bid.  Discussed importance of taking before breakfast and dinner.  Discussed the importance of a low carbohydrate diet.  Checked Hb A1C at this visit.

## 2022-07-22 NOTE — Assessment & Plan Note (Signed)
Improved on last labs. Continue current medication regimen. Followed by nephrology.

## 2022-07-22 NOTE — Patient Instructions (Signed)
Increase glipizide to twice daily before meals   Try fluconazole for the vaginal discharge

## 2022-07-22 NOTE — Assessment & Plan Note (Signed)
Chronic and stable.  Continue current medication regimen.  Followed by rheumatology.

## 2022-07-22 NOTE — Assessment & Plan Note (Signed)
Chronic and well controlled.  Continue current medication regimen.  

## 2022-07-22 NOTE — Assessment & Plan Note (Signed)
Chronic and well-controled. Ordered lipid panel at this visit.  Continue current medication regimen.

## 2022-07-22 NOTE — Assessment & Plan Note (Signed)
Chronic and stable.   

## 2022-07-22 NOTE — Progress Notes (Signed)
Established patient visit   Patient: Kelli PasturesJonde G Opara   DOB: 10/26/1950   72 y.o. Female  MRN: [id number] Visit Date: 07/22/2022  Today's healthcare provider: Shirlee LatchAngela Vannak Montenegro, MD   Chief Complaint  Patient presents with   Diabetes   Subjective    HPI  Diabetes Mellitus Type II, Follow-up  Lab Results  Component Value Date   HGBA1C 9.9 (A) 07/22/2022   HGBA1C 9.6 07/13/2022   HGBA1C 7.1 (A) 01/19/2022   Wt Readings from Last 3 Encounters:  07/22/22 137 lb 6.4 oz (62.3 kg)  06/15/22 137 lb 8 oz (62.4 kg)  01/19/22 141 lb 6.4 oz (64.1 kg)   Last seen for diabetes 2 weeks ago.  Management since then includes increase glipizide to 5mg  from 2.5mg  daily  She reports good compliance with her medications.  Home blood sugar records: fasting range: 150's and postprandial range: over 200 Does not take sugars everyday.   She notes brief episodes of dizziness every other day, but does not know if that is from her diabetes or other health concerns. She describes the dizziness as feeling unsteady. She has not fallen.   Sleeping difficulties  Has difficulty sleeping due to frequently waking up to pee at night.  She describes her urine as clear.  She completely empties her bladder when she uses the restroom.  She attributes this to drinking a lot of water due to her kidney disease.   Vaginal discharge Has noted dried white vaginal discharge on her labia when bathing for the past couple months. Occasionally itchy and has a foul odor.  Not currently sexually active.   Pertinent Labs: Lab Results  Component Value Date   CHOL 92 (L) 06/16/2021   HDL 48 06/16/2021   LDLCALC 28 06/16/2021   TRIG 80 06/16/2021   CHOLHDL 1.9 06/16/2021   Lab Results  Component Value Date   NA 137 07/13/2022   K 4.7 07/13/2022   CREATININE 1.8 (A) 07/13/2022   EGFR 30 07/13/2022   LABMICR 68.4 06/25/2021   MICRALBCREAT 306.3 07/13/2022      ---------------------------------------------------------------------------------------------------   Medications: Outpatient Medications Prior to Visit  Medication Sig   aspirin 81 MG chewable tablet Chew 81 mg by mouth daily.   atorvastatin (LIPITOR) 20 MG tablet Take 1 tablet by mouth once daily   blood glucose meter kit and supplies Accu-Chek Guide Me meter. To check blood sugar once daily. Dx: E11.65   clopidogrel (PLAVIX) 75 MG tablet Take 1 tablet by mouth once daily   Glucose Blood (BLOOD GLUCOSE TEST STRIPS 333) STRP To check blood sugar daily. Dx: E11.65   hydroxychloroquine (PLAQUENIL) 200 MG tablet Take 200 mg by mouth daily.   JARDIANCE 25 MG TABS tablet TAKE 1 TABLET BY MOUTH ONCE DAILY BEFORE BREAKFAST   Lancet Devices MISC Use to test blood glucose once daily. Dx: E11.65   metoprolol succinate (TOPROL-XL) 25 MG 24 hr tablet Take 1 tablet by mouth once daily   patiromer (VELTASSA) 8.4 g packet Take 1 packet (8.4 g total) by mouth daily.   pregabalin (LYRICA) 25 MG capsule Take 1 capsule (25 mg total) by mouth 2 (two) times daily.   Semaglutide (RYBELSUS) 14 MG TABS Take 1 tablet by mouth once daily   traMADol (ULTRAM) 50 MG tablet Take 50 mg by mouth every 6 (six) hours as needed for severe pain.   [DISCONTINUED] glipiZIDE (GLUCOTROL) 5 MG tablet Take 1 tablet (5 mg total) by mouth daily before breakfast.  No facility-administered medications prior to visit.    Review of Systems  Constitutional: Negative.   HENT:  Negative for congestion and rhinorrhea.   Respiratory:  Negative for shortness of breath.   Cardiovascular:  Negative for chest pain and palpitations.  Gastrointestinal:  Negative for abdominal pain and blood in stool.  Genitourinary:  Positive for frequency and vaginal discharge. Negative for decreased urine volume, difficulty urinating, dysuria, flank pain, hematuria, pelvic pain, urgency, vaginal bleeding and vaginal pain.  Skin:  Negative for rash.   Neurological:  Positive for dizziness. Negative for tremors, weakness, light-headedness, numbness and headaches.       Objective    BP 113/67 (BP Location: Left Arm, Patient Position: Sitting, Cuff Size: Large)   Pulse 63   Temp 97.6 F (36.4 C) (Temporal)   Resp 16   Wt 137 lb 6.4 oz (62.3 kg)   SpO2 99%   BMI 25.13 kg/m    Physical Exam Constitutional:      General: She is not in acute distress.    Appearance: She is not ill-appearing.  HENT:     Head: Normocephalic.  Cardiovascular:     Pulses: Normal pulses.     Heart sounds: Normal heart sounds.  Pulmonary:     Effort: Pulmonary effort is normal.     Breath sounds: Normal breath sounds.  Abdominal:     General: Bowel sounds are normal. There is no distension.     Palpations: Abdomen is soft. There is no mass.     Tenderness: There is no abdominal tenderness.  Musculoskeletal:     Cervical back: Normal range of motion.  Skin:    General: Skin is warm and dry.  Neurological:     Mental Status: She is alert.  Psychiatric:        Behavior: Behavior normal.    Patient declines pelvic exam today  Results for orders placed or performed in visit on 07/22/22  POCT glycosylated hemoglobin (Hb A1C)  Result Value Ref Range   Hemoglobin A1C 9.9 (A) 4.0 - 5.6 %   Est. average glucose Bld gHb Est-mCnc 237     Assessment & Plan     1. Type 2 diabetes mellitus with hyperglycemia, without long-term current use of insulin Chronic and uncontrolled.  Increase glipizide to 5 mg bid.  Discussed importance of taking before breakfast and dinner.  Discussed the importance of a low carbohydrate diet.  Checked Hb A1C at this visit. - POCT glycosylated hemoglobin (Hb A1C)  2. Hyperlipidemia associated with type 2 diabetes mellitus Chronic and well-controled. Ordered lipid panel at this visit.  Continue current medication regimen.  - Lipid panel  3. Diabetic peripheral neuropathy Chronic and stable.  Continue current  medication regimen.   4. Hypertension associated with diabetes Chronic and well controlled.  Continue current medication regimen.   5. CKD (chronic kidney disease) stage 4, GFR 15-29 ml/min Improved on last labs. Continue current medication regimen. Followed by nephrology.   6. 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 Continue current medication regimen.  Followed by nephrology.   7. 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   8. Rheumatoid arthritis with unknown rheumatoid factor status Chronic and stable.  Continue current medication regimen.  Followed by rheumatology.   9. COPD, mild Chronic and stable.  Continue current medication regimen.   10. Current moderate episode of major depressive disorder without prior episode Chronic and stable.  11. Vaginal discharge Symptoms in the setting of elevated BG most c/w yeast infection.  Patient denied examination of the area at this visit.  Start fluconazole 150 mg two doses, two days apart.  Advised to return if symptoms worsen or do not improve.    Return in about 3 months (around 10/21/2022) for chronic disease f/u.      Ezekiel Slocumb, MS3 [employer] [employer]  Patient seen along with MS3 student Ezekiel Slocumb. I personally evaluated this patient along with the student, and verified all aspects of the history, physical exam, and medical decision making as documented by the student. I agree with the student's documentation and have made all necessary edits.  Rage Beever, Marzella Schlein, MD, MPH [facility]

## 2022-07-22 NOTE — Assessment & Plan Note (Addendum)
Improved on last labs. Continue current medication regimen. Followed by nephrology.  

## 2022-07-23 LAB — LIPID PANEL
Chol/HDL Ratio: 1.5 ratio (ref 0.0–4.4)
Cholesterol, Total: 91 mg/dL — ABNORMAL LOW (ref 100–199)
HDL: 60 mg/dL (ref >39)
LDL Chol Calc (NIH): 16 mg/dL (ref 0–99)
Triglycerides: 65 mg/dL (ref 0–149)
VLDL Cholesterol Cal: 15 mg/dL (ref 5–40)

## 2022-08-16 ENCOUNTER — Other Ambulatory Visit: Payer: Self-pay | Admitting: Family Medicine

## 2022-08-17 NOTE — Telephone Encounter (Signed)
Requested medication (s) are due for refill today:   Yes  Requested medication (s) are on the active medication list:   Yes  Future visit scheduled:   Yes 10/25/2022 with Dr. Beryle Flock   Last ordered: 03/10/2022 #90, 1 refill  Returned because there is not a protocol assigned to this medication.   Requested Prescriptions  Pending Prescriptions Disp Refills   RYBELSUS 14 MG TABS [Pharmacy Med Name: Rybelsus 14 MG Oral Tablet] 90 tablet 0    Sig: Take 1 tablet by mouth once daily     Off-Protocol Failed - 08/16/2022  4:45 PM      Failed - Medication not assigned to a protocol, review manually.      Passed - Valid encounter within last 12 months    Recent Outpatient Visits           3 weeks ago Type 2 diabetes mellitus with hyperglycemia, without long-term current use of insulin [facility])   [place] [facility] [place], Marzella Schlein, MD   2 months ago Type 2 diabetes mellitus with hyperglycemia, without long-term current use of insulin [facility])   [place] [facility] [place], Marzella Schlein, MD   4 months ago COVID   [place] [facility] [place], Marzella Schlein, MD   6 months ago Need for immunization against influenza   [place] [facility] [place], Marzella Schlein, MD   7 months ago Type 2 diabetes mellitus with hyperglycemia, without long-term current use of insulin [facility])   [place] [facility] Bacigalupo, Marzella Schlein, MD       Future Appointments             In 2 months Bacigalupo, Marzella Schlein, MD [facility]

## 2022-08-18 NOTE — Telephone Encounter (Signed)
[facility] faxed another refill request for the following medications:   Semaglutide (RYBELSUS) 14 MG TABS   Please advise.

## 2022-08-19 ENCOUNTER — Ambulatory Visit: Payer: Self-pay

## 2022-08-19 ENCOUNTER — Other Ambulatory Visit: Payer: Self-pay

## 2022-08-19 MED ORDER — RYBELSUS 14 MG PO TABS: 1.0000 | ORAL_TABLET | Freq: Every day | ORAL | 1 refills | Status: DC

## 2022-08-19 NOTE — Telephone Encounter (Signed)
Patient called in checking status of med, Rybelsus, 14mg , I let her know this is still being worked on. She is all out of this med.

## 2022-08-19 NOTE — Telephone Encounter (Signed)
Rybelsus Refill sent to pharmacy.

## 2022-08-19 NOTE — Telephone Encounter (Signed)
Patient advised.

## 2022-08-19 NOTE — Telephone Encounter (Signed)
Requested medication (s) are due for refill today: no  Requested medication (s) are on the active medication list: yes  Last refill:  03/10/22 #90 1 RF  Future visit scheduled: yes  Notes to clinic:  med not assigned to a protocol   Requested Prescriptions  Pending Prescriptions Disp Refills   RYBELSUS 14 MG TABS [Pharmacy Med Name: Rybelsus 14 MG Oral Tablet] 90 tablet 0    Sig: Take 1 tablet by mouth once daily     Off-Protocol Failed - 08/19/2022 12:45 PM      Failed - Medication not assigned to a protocol, review manually.      Passed - Valid encounter within last 12 months    Recent Outpatient Visits           4 weeks ago Type 2 diabetes mellitus with hyperglycemia, without long-term current use of insulin [facility])   [facility] [place], Marzella Schlein, MD   2 months ago Type 2 diabetes mellitus with hyperglycemia, without long-term current use of insulin [facility])   [facility] [place], Marzella Schlein, MD   4 months ago COVID   [facility] [place], Marzella Schlein, MD   7 months ago Need for immunization against influenza   [facility] [place], Marzella Schlein, MD   7 months ago Type 2 diabetes mellitus with hyperglycemia, without long-term current use of insulin [facility])   [facility], Marzella Schlein, MD       Future Appointments             In 2 months Bacigalupo, Marzella Schlein, MD [facility]

## 2022-08-19 NOTE — Addendum Note (Signed)
Addended by: Erasmo Downer on: 08/19/2022 02:29 PM   Modules accepted: Orders

## 2022-08-19 NOTE — Telephone Encounter (Signed)
  Chief Complaint: Missing medication - dizziness - BS in 200's Symptoms: above Frequency: Since being out of medication Pertinent Negatives: Patient denies  Disposition: [] ED /[] Urgent Care (no appt availability in office) / [] Appointment(In office/virtual)/ []  [place] [facility]/ [] Home Care/ [] Refused Recommended Disposition /[] [place] [facility]/ [x]  Follow-up with PCP Additional Notes: Pt called regarding refill of Ryblesus. Pt has requested refill from pharmacy , but has not gotten refill. PT states that she is dizzy from high blood sugar. Pt did not want to discuss dizziness. She wants her medication refilled ASAP.     Summary: dizzy   Pt called in says feeling dizzy. She is waiting on the refill of Rybelsus, requested May 6     Reason for Disposition  Blood glucose 70-240 mg/dL (3.9 -13.2 mmol/L)  Answer Assessment - Initial Assessment Questions 1. BLOOD GLUCOSE: "What is your blood glucose level?"      200's 2. ONSET: "When did you check the blood glucose?"     Not every day - does not want to talk about it. She just wants her medicine 3. USUAL RANGE: "What is your glucose level usually?" (e.g., usual fasting morning value, usual evening value)     4. KETONES: "Do you check for ketones (urine or blood test strips)?" If Yes, ask: "What does the test show now?"       5. TYPE 1 or 2:  "Do you know what type of diabetes you have?"  (e.g., Type 1, Type 2, Gestational; doesn't know)       6. INSULIN: "Do you take insulin?" "What type of insulin(s) do you use? What is the mode of delivery? (syringe, pen; injection or pump)?"       7. DIABETES PILLS: "Do you take any pills for your diabetes?" If Yes, ask: "Have you missed taking any pills recently?"     yes 8. OTHER SYMPTOMS: "Do you have any symptoms?" (e.g., fever, frequent urination, difficulty breathing, dizziness, weakness, vomiting)     dizziness  Protocols used: Diabetes - High Blood Sugar-A-AH

## 2022-09-16 ENCOUNTER — Other Ambulatory Visit: Payer: Self-pay | Admitting: Family Medicine

## 2022-09-16 NOTE — Telephone Encounter (Signed)
Requested Prescriptions  Pending Prescriptions Disp Refills   JARDIANCE 25 MG TABS tablet [Pharmacy Med Name: Jardiance 25 MG Oral Tablet] 90 tablet 0    Sig: TAKE 1 TABLET BY MOUTH ONCE DAILY BEFORE BREAKFAST     Endocrinology:  Diabetes - SGLT2 Inhibitors Failed - 09/16/2022  9:55 AM      Failed - Cr in normal range and within 360 days    Creatinine  Date Value Ref Range Status  07/13/2022 1.8 (A) 0.5 - 1.1 Final  10/10/2012 0.93 0.60 - 1.30 mg/dL Final   Creatinine, Ser  Date Value Ref Range Status  06/16/2021 2.23 (H) 0.57 - 1.00 mg/dL Final   Creatinine, Urine  Date Value Ref Range Status  07/13/2022 38.2  Final         Failed - HBA1C is between 0 and 7.9 and within 180 days    Hemoglobin A1C  Date Value Ref Range Status  07/22/2022 9.9 (A) 4.0 - 5.6 % Final  07/13/2022 9.6  Final         Passed - eGFR in normal range and within 360 days    EGFR (African American)  Date Value Ref Range Status  10/10/2012 >60  Final   GFR calc Af Amer  Date Value Ref Range Status  03/13/2020 25 (L) >59 mL/min/1.73 Final    Comment:    **In accordance with recommendations from the NKF-ASN Task force,**   [facility] is in the process of updating its eGFR calculation to the   2021 CKD-EPI creatinine equation that estimates kidney function   without a race variable.    EGFR (Non-African Amer.)  Date Value Ref Range Status  10/10/2012 >60  Final    Comment:    eGFR values <51mL/min/1.73 m2 may be an indication of chronic kidney disease (CKD). Calculated eGFR is useful in patients with stable renal function. The eGFR calculation will not be reliable in acutely ill patients when serum creatinine is changing rapidly. It is not useful in  patients on dialysis. The eGFR calculation may not be applicable to patients at the low and high extremes of body sizes, pregnant women, and vegetarians.    GFR calc non Af Amer  Date Value Ref Range Status  03/13/2020 22 (L) >59 mL/min/1.73 Final    eGFR  Date Value Ref Range Status  07/13/2022 30  Final  06/16/2021 23 (L) >59 mL/min/1.73 Final         Passed - Valid encounter within last 6 months    Recent Outpatient Visits           1 month ago Type 2 diabetes mellitus with hyperglycemia, without long-term current use of insulin [facility])   [facility] [place], Marzella Schlein, MD   3 months ago Type 2 diabetes mellitus with hyperglycemia, without long-term current use of insulin [facility])   [facility] [place], Marzella Schlein, MD   5 months ago COVID   [facility] [place], Marzella Schlein, MD   7 months ago Need for immunization against influenza   [facility] [place], Marzella Schlein, MD   8 months ago Type 2 diabetes mellitus with hyperglycemia, without long-term current use of insulin [facility])   [facility] [place], Marzella Schlein, MD       Future Appointments             In 1 month Bacigalupo, Marzella Schlein, MD [facility]

## 2022-09-30 ENCOUNTER — Other Ambulatory Visit: Payer: Self-pay | Admitting: Family Medicine

## 2022-09-30 DIAGNOSIS — E785 Hyperlipidemia, unspecified: Secondary | ICD-10-CM

## 2022-10-19 ENCOUNTER — Ambulatory Visit (INDEPENDENT_AMBULATORY_CARE_PROVIDER_SITE_OTHER): Payer: Medicare Other | Admitting: Family Medicine

## 2022-10-19 VITALS — Ht 62.0 in | Wt 138.0 lb

## 2022-10-19 DIAGNOSIS — Z0000 Encounter for general adult medical examination without abnormal findings: Secondary | ICD-10-CM

## 2022-10-19 NOTE — Patient Instructions (Addendum)
[name] , Thank you for taking time to come for your Medicare Wellness Visit. I appreciate your ongoing commitment to your health goals. Please review the following plan we discussed and let me know if I can assist you in the future.   These are the goals we discussed:  Goals       DIET - EAT MORE FRUITS AND VEGETABLES      DIET - INCREASE WATER INTAKE      Recommend to drink at least 6-8 8oz glasses of water per day.      Find Help in My Community      Timeframe:  Long-Range Goal Priority:  Medium Start Date:       09/03/21                      Expected End Date:    10/01/21               Follow Up Date  - begin a notebook of services in my neighborhood or community - follow-up on any referrals for help I am given - think ahead to make sure my need does not become an emergency - make a note about what I need to have by the phone or take with me, like an identification card or social security number have a back-up plan - have a back-up plan - make a list of family or friends that I can call  -  Why is this important?   Knowing how and where to find help for yourself or family in your neighborhood and community is an important skill.  You will want to take some steps to learn how.    Notes:       I want to know my medications (pt-stated)      Current Barriers:  Polypharmacy; complex patient with multiple comorbidities including DM, HTN Self-manages medications, Does not use a pill box or other adherence strategies   Pharmacist Clinical Goal(s):  Over the next 30 days, patient will work with PharmD and provider towards optimized medication management  Interventions: Comprehensive medication review performed; medication list updated in electronic medical record Requested fill history from several pharmacies patient has used in the past 1 year in order to clarify adherence Developed a medication chart that patient can refer to at doctor office visits and to help  adherence  Patient Self Care Activities:  Patient will take medications as prescribed Patient will focus on improved adherence by utilizing medication adherence chart   Initial goal documentation       Patient Stated      None        This is a list of the screening recommended for you and due dates:  Health Maintenance  Topic Date Due   Zoster (Shingles) Vaccine (1 of 2) Never done   COVID-19 Vaccine (4 - 2023-24 season) 12/11/2021   Mammogram  10/17/2022   Flu Shot  11/11/2022   Complete foot exam   01/20/2023   Hemoglobin A1C  01/21/2023   Cologuard (Stool DNA test)  02/10/2023   Eye exam for diabetics  02/25/2023   Yearly kidney function blood test for diabetes  07/13/2023   Yearly kidney health urinalysis for diabetes  07/13/2023   Medicare Annual Wellness Visit  10/19/2023   DTaP/Tdap/Td vaccine (5 - Td or Tdap) 01/21/2031   Pneumonia Vaccine  Completed   DEXA scan (bone density measurement)  Completed   Hepatitis C Screening  Completed  HPV Vaccine  Aged Out    Advanced directives: Will work on this  Conditions/risks identified: none  Next appointment: Follow up in one year for your annual wellness visit 10/24/23 @ 8:15   Preventive Care 65 Years and Older, Female Preventive care refers to lifestyle choices and visits with your health care provider that can promote health and wellness. What does preventive care include? A yearly physical exam. This is also called an annual well check. Dental exams once or twice a year. Routine eye exams. Ask your health care provider how often you should have your eyes checked. Personal lifestyle choices, including: Daily care of your teeth and gums. Regular physical activity. Eating a healthy diet. Avoiding tobacco and drug use. Limiting alcohol use. Practicing safe sex. Taking low-dose aspirin every day. Taking vitamin and mineral supplements as recommended by your health care provider. What happens during an annual  well check? The services and screenings done by your health care provider during your annual well check will depend on your age, overall health, lifestyle risk factors, and family history of disease. Counseling  Your health care provider may ask you questions about your: Alcohol use. Tobacco use. Drug use. Emotional well-being. Home and relationship well-being. Sexual activity. Eating habits. History of falls. Memory and ability to understand (cognition). Work and work Astronomer. Reproductive health. Screening  You may have the following tests or measurements: Height, weight, and BMI. Blood pressure. Lipid and cholesterol levels. These may be checked every 5 years, or more frequently if you are over 18 years old. Skin check. Lung cancer screening. You may have this screening every year starting at age 70 if you have a 30-pack-year history of smoking and currently smoke or have quit within the past 15 years. Fecal occult blood test (FOBT) of the stool. You may have this test every year starting at age 57. Flexible sigmoidoscopy or colonoscopy. You may have a sigmoidoscopy every 5 years or a colonoscopy every 10 years starting at age 63. Hepatitis C blood test. Hepatitis B blood test. Sexually transmitted disease (STD) testing. Diabetes screening. This is done by checking your blood sugar (glucose) after you have not eaten for a while (fasting). You may have this done every 1-3 years. Bone density scan. This is done to screen for osteoporosis. You may have this done starting at age 14. Mammogram. This may be done every 1-2 years. Talk to your health care provider about how often you should have regular mammograms. Talk with your health care provider about your test results, treatment options, and if necessary, the need for more tests. Vaccines  Your health care provider may recommend certain vaccines, such as: Influenza vaccine. This is recommended every year. Tetanus, diphtheria,  and acellular pertussis (Tdap, Td) vaccine. You may need a Td booster every 10 years. Zoster vaccine. You may need this after age 75. Pneumococcal 13-valent conjugate (PCV13) vaccine. One dose is recommended after age 56. Pneumococcal polysaccharide (PPSV23) vaccine. One dose is recommended after age 96. Talk to your health care provider about which screenings and vaccines you need and how often you need them. This information is not intended to replace advice given to you by your health care provider. Make sure you discuss any questions you have with your health care provider. Document Released: 04/25/2015 Document Revised: 12/17/2015 Document Reviewed: 01/28/2015 Elsevier Interactive Patient Education  2017 [employer].  Fall Prevention in the Home Falls can cause injuries. They can happen to people of all ages. There are many things  you can do to make your home safe and to help prevent falls. What can I do on the outside of my home? Regularly fix the edges of walkways and driveways and fix any cracks. Remove anything that might make you trip as you walk through a door, such as a raised step or threshold. Trim any bushes or trees on the path to your home. Use bright outdoor lighting. Clear any walking paths of anything that might make someone trip, such as rocks or tools. Regularly check to see if handrails are loose or broken. Make sure that both sides of any steps have handrails. Any raised decks and porches should have guardrails on the edges. Have any leaves, snow, or ice cleared regularly. Use sand or salt on walking paths during winter. Clean up any spills in your garage right away. This includes oil or grease spills. What can I do in the bathroom? Use night lights. Install grab bars by the toilet and in the tub and shower. Do not use towel bars as grab bars. Use non-skid mats or decals in the tub or shower. If you need to sit down in the shower, use a plastic, non-slip  stool. Keep the floor dry. Clean up any water that spills on the floor as soon as it happens. Remove soap buildup in the tub or shower regularly. Attach bath mats securely with double-sided non-slip rug tape. Do not have throw rugs and other things on the floor that can make you trip. What can I do in the bedroom? Use night lights. Make sure that you have a light by your bed that is easy to reach. Do not use any sheets or blankets that are too big for your bed. They should not hang down onto the floor. Have a firm chair that has side arms. You can use this for support while you get dressed. Do not have throw rugs and other things on the floor that can make you trip. What can I do in the kitchen? Clean up any spills right away. Avoid walking on wet floors. Keep items that you use a lot in easy-to-reach places. If you need to reach something above you, use a strong step stool that has a grab bar. Keep electrical cords out of the way. Do not use floor polish or wax that makes floors slippery. If you must use wax, use non-skid floor wax. Do not have throw rugs and other things on the floor that can make you trip. What can I do with my stairs? Do not leave any items on the stairs. Make sure that there are handrails on both sides of the stairs and use them. Fix handrails that are broken or loose. Make sure that handrails are as long as the stairways. Check any carpeting to make sure that it is firmly attached to the stairs. Fix any carpet that is loose or worn. Avoid having throw rugs at the top or bottom of the stairs. If you do have throw rugs, attach them to the floor with carpet tape. Make sure that you have a light switch at the top of the stairs and the bottom of the stairs. If you do not have them, ask someone to add them for you. What else can I do to help prevent falls? Wear shoes that: Do not have high heels. Have rubber bottoms. Are comfortable and fit you well. Are closed at the  toe. Do not wear sandals. If you use a stepladder: Make sure that it is  fully opened. Do not climb a closed stepladder. Make sure that both sides of the stepladder are locked into place. Ask someone to hold it for you, if possible. Clearly mark and make sure that you can see: Any grab bars or handrails. First and last steps. Where the edge of each step is. Use tools that help you move around (mobility aids) if they are needed. These include: Canes. Walkers. Scooters. Crutches. Turn on the lights when you go into a dark area. Replace any light bulbs as soon as they burn out. Set up your furniture so you have a clear path. Avoid moving your furniture around. If any of your floors are uneven, fix them. If there are any pets around you, be aware of where they are. Review your medicines with your doctor. Some medicines can make you feel dizzy. This can increase your chance of falling. Ask your doctor what other things that you can do to help prevent falls. This information is not intended to replace advice given to you by your health care provider. Make sure you discuss any questions you have with your health care provider. Document Released: 01/23/2009 Document Revised: 09/04/2015 Document Reviewed: 05/03/2014 Elsevier Interactive Patient Education  2017 [employer] Managing Pain Without Opioids Opioids are strong medicines used to treat moderate to severe pain. For some people, especially those who have long-term (chronic) pain, opioids may not be the best choice for pain management due to: Side effects like nausea, constipation, and sleepiness. The risk of addiction (opioid use disorder). The longer you take opioids, the greater your risk of addiction. Pain that lasts for more than 3 months is called chronic pain. Managing chronic pain usually requires more than one approach and is often provided by a team of health care providers working together (multidisciplinary approach). Pain  management may be done at a pain management center or pain clinic. How to manage pain without the use of opioids Use non-opioid medicines Non-opioid medicines for pain may include: Over-the-counter or prescription non-steroidal anti-inflammatory drugs (NSAIDs). These may be the first medicines used for pain. They work well for muscle and bone pain, and they reduce swelling. Acetaminophen. This over-the-counter medicine may work well for milder pain but not swelling. Antidepressants. These may be used to treat chronic pain. A certain type of antidepressant (tricyclics) is often used. These medicines are given in lower doses for pain than when used for depression. Anticonvulsants. These are usually used to treat seizures but may also reduce nerve (neuropathic) pain. Muscle relaxants. These relieve pain caused by sudden muscle tightening (spasms). You may also use a pain medicine that is applied to the skin as a patch, cream, or gel (topical analgesic), such as a numbing medicine. These may cause fewer side effects than medicines taken by mouth. Do certain therapies as directed Some therapies can help with pain management. They include: Physical therapy. You will do exercises to gain strength and flexibility. A physical therapist may teach you exercises to move and stretch parts of your body that are weak, stiff, or painful. You can learn these exercises at physical therapy visits and practice them at home. Physical therapy may also involve: Massage. Heat wraps or applying heat or cold to affected areas. Electrical signals that interrupt pain signals (transcutaneous electrical nerve stimulation, TENS). Weak lasers that reduce pain and swelling (low-level laser therapy). Signals from your body that help you learn to regulate pain (biofeedback). Occupational therapy. This helps you to learn ways to function at home and work  with less pain. Recreational therapy. This involves trying new activities or  hobbies, such as a physical activity or drawing. Mental health therapy, including: Cognitive behavioral therapy (CBT). This helps you learn coping skills for dealing with pain. Acceptance and commitment therapy (ACT) to change the way you think and react to pain. Relaxation therapies, including muscle relaxation exercises and mindfulness-based stress reduction. Pain management counseling. This may be individual, family, or group counseling.  Receive medical treatments Medical treatments for pain management include: Nerve block injections. These may include a pain blocker and anti-inflammatory medicines. You may have injections: Near the spine to relieve chronic back or neck pain. Into joints to relieve back or joint pain. Into nerve areas that supply a painful area to relieve body pain. Into muscles (trigger point injections) to relieve some painful muscle conditions. A medical device placed near your spine to help block pain signals and relieve nerve pain or chronic back pain (spinal cord stimulation device). Acupuncture. Follow these instructions at home Medicines Take over-the-counter and prescription medicines only as told by your health care provider. If you are taking pain medicine, ask your health care providers about possible side effects to watch out for. Do not drive or use heavy machinery while taking prescription opioid pain medicine. Lifestyle  Do not use drugs or alcohol to reduce pain. If you drink alcohol, limit how much you have to: 0-1 drink a day for women who are not pregnant. 0-2 drinks a day for men. Know how much alcohol is in a drink. In the [place] one drink equals one 12 oz bottle of beer (355 mL), one 5 oz glass of wine (148 mL), or one 1 oz glass of hard liquor (44 mL). Do not use any products that contain nicotine or tobacco. These products include cigarettes, chewing tobacco, and vaping devices, such as e-cigarettes. If you need help quitting, ask your health  care provider. Eat a healthy diet and maintain a healthy weight. Poor diet and excess weight may make pain worse. Eat foods that are high in fiber. These include fresh fruits and vegetables, whole grains, and beans. Limit foods that are high in fat and processed sugars, such as fried and sweet foods. Exercise regularly. Exercise lowers stress and may help relieve pain. Ask your health care provider what activities and exercises are safe for you. If your health care provider approves, join an exercise class that combines movement and stress reduction. Examples include yoga and tai chi. Get enough sleep. Lack of sleep may make pain worse. Lower stress as much as possible. Practice stress reduction techniques as told by your therapist. General instructions Work with all your pain management providers to find the treatments that work best for you. You are an important member of your pain management team. There are many things you can do to reduce pain on your own. Consider joining an online or in-person support group for people who have chronic pain. Keep all follow-up visits. This is important. Where to find more information You can find more information about managing pain without opioids from: American Academy of Pain Medicine: painmed.org Institute for Chronic Pain: instituteforchronicpain.org American Chronic Pain Association: theacpa.org Contact a health care provider if: You have side effects from pain medicine. Your pain gets worse or does not get better with treatments or home therapy. You are struggling with anxiety or depression. Summary Many types of pain can be managed without opioids. Chronic pain may respond better to pain management without opioids. Pain is best managed  when you and a team of health care providers work together. Pain management without opioids may include non-opioid medicines, medical treatments, physical therapy, mental health therapy, and lifestyle  changes. Tell your health care providers if your pain gets worse or is not being managed well enough. This information is not intended to replace advice given to you by your health care provider. Make sure you discuss any questions you have with your health care provider. Document Revised: 07/09/2020 Document Reviewed: 07/09/2020 Elsevier Patient Education  2024 [employer].

## 2022-10-19 NOTE — Progress Notes (Signed)
Subjective:   Kelli Gutierrez is a 72 y.o. female who presents for Medicare Annual (Subsequent) preventive examination.  Visit Complete: Virtual  I connected with  Daria Pastures on 10/19/22 by a audio enabled telemedicine application and verified that I am speaking with the correct person using two identifiers.  Patient Location: Home  Provider Location: Office/Clinic  I discussed the limitations of evaluation and management by telemedicine. The patient expressed understanding and agreed to proceed.   Review of Systems     Cardiac Risk Factors include: advanced age (>12men, >75 women);diabetes mellitus;dyslipidemia;hypertension     Objective:    Today's Vitals   10/19/22 0930 10/19/22 0931  Weight: 138 lb (62.6 kg)   Height: 5\' 2"  (1.575 m)   PainSc:  5    Body mass index is 25.24 kg/m.     10/19/2022    9:51 AM 10/15/2021   10:21 AM 08/10/2021   10:36 AM 08/08/2019   11:49 AM 05/02/2019    9:19 AM 02/03/2019   12:18 PM 08/10/2017    8:47 AM  Advanced Directives  Does Patient Have a Medical Advance Directive? No No No No No No No  Would patient like information on creating a medical advance directive?  No - Patient declined No - Patient declined  No - Patient declined No - Patient declined No - Patient declined    Current Medications (verified) Outpatient Encounter Medications as of 10/19/2022  Medication Sig   aspirin 81 MG chewable tablet Chew 81 mg by mouth daily.   atorvastatin (LIPITOR) 20 MG tablet Take 1 tablet by mouth once daily   blood glucose meter kit and supplies Accu-Chek Guide Me meter. To check blood sugar once daily. Dx: E11.65   clopidogrel (PLAVIX) 75 MG tablet Take 1 tablet by mouth once daily   glipiZIDE (GLUCOTROL) 5 MG tablet Take 1 tablet (5 mg total) by mouth 2 (two) times daily before a meal.   Glucose Blood (BLOOD GLUCOSE TEST STRIPS 333) STRP To check blood sugar daily. Dx: E11.65   hydroxychloroquine (PLAQUENIL) 200 MG tablet Take 200 mg by mouth  daily.   JARDIANCE 25 MG TABS tablet TAKE 1 TABLET BY MOUTH ONCE DAILY BEFORE BREAKFAST   Lancet Devices MISC Use to test blood glucose once daily. Dx: E11.65   metoprolol succinate (TOPROL-XL) 25 MG 24 hr tablet Take 1 tablet by mouth once daily   Semaglutide (RYBELSUS) 14 MG TABS Take 1 tablet (14 mg total) by mouth daily.   traMADol (ULTRAM) 50 MG tablet Take 50 mg by mouth every 6 (six) hours as needed for severe pain.   patiromer (VELTASSA) 8.4 g packet Take 1 packet (8.4 g total) by mouth daily. (Patient not taking: Reported on 10/19/2022)   pregabalin (LYRICA) 25 MG capsule Take 1 capsule (25 mg total) by mouth 2 (two) times daily. (Patient not taking: Reported on 10/19/2022)   No facility-administered encounter medications on file as of 10/19/2022.    Allergies (verified) Hydrocodone   History: Past Medical History:  Diagnosis Date   Acute CVA (cerebrovascular accident) (HCC) 12/17/2014   Anemia    Breast pain, right 06/01/2015   Chronic kidney disease 12/2015   CVA (cerebral infarction) 12/17/2014   Diabetes mellitus without complication (HCC)    GERD (gastroesophageal reflux disease)    Hyperlipidemia    Hypertension    Lumbar radiculitis 12/06/2013   Meningitis    Rheumatoid arthritis (HCC)    Stroke [facility])    Past Surgical History:  Procedure Laterality Date   APPENDECTOMY     CESAREAN SECTION     4   CHOLECYSTECTOMY     MIDDLE EAR SURGERY     Family History  Problem Relation Age of Onset   Leukemia Mother        unsure of conditions, was "healthy, old lady"   Stroke Father    Diabetes Father    Diabetes Sister    Breast cancer Neg Hx    Colon cancer Neg Hx    Social History   Socioeconomic History   Marital status: Legally Separated    Spouse name: Not on file   Number of children: 4   Years of education: Not on file   Highest education level: 8th grade  Occupational History   Occupation: retired   Occupation: disable  Tobacco Use   Smoking status: Never    Smokeless tobacco: Never  Vaping Use   Vaping Use: Never used  Substance and Sexual Activity   Alcohol use: No   Drug use: Never   Sexual activity: Not Currently  Other Topics Concern   Not on file  Social History Narrative   Married but separated. Takes care of her son that is handicap   Social Determinants of Corporate investment banker Strain: Low Risk  (10/19/2022)   Overall Financial Resource Strain (CARDIA)    Difficulty of Paying Living Expenses: Not hard at all  Food Insecurity: No Food Insecurity (10/19/2022)   Hunger Vital Sign    Worried About Running Out of Food in the Last Year: Never true    Ran Out of Food in the Last Year: Never true  Transportation Needs: No Transportation Needs (10/19/2022)   PRAPARE - Administrator, Civil Service (Medical): No    Lack of Transportation (Non-Medical): No  Physical Activity: Inactive (10/19/2022)   Exercise Vital Sign    Days of Exercise per Week: 0 days    Minutes of Exercise per Session: 0 min  Stress: Stress Concern Present (10/19/2022)   [employer] of Occupational Health - Occupational Stress Questionnaire    Feeling of Stress : Rather much  Social Connections: Socially Isolated (10/19/2022)   Social Connection and Isolation Panel [NHANES]    Frequency of Communication with Friends and Family: Three times a week    Frequency of Social Gatherings with Friends and Family: Once a week    Attends Religious Services: Never    Database administrator or Organizations: No    Attends Engineer, structural: Never    Marital Status: Separated    Tobacco Counseling Counseling given: Not Answered   Clinical Intake:  Pre-visit preparation completed: Yes  Pain : 0-10 Pain Score: 5  Pain Type: Chronic pain Pain Location: Shoulder (all over because of arthritis) Pain Orientation: Other (Comment) (all over because of arthritis) Pain Descriptors / Indicators: Constant Pain Onset: More than a month ago Pain  Frequency: Constant     BMI - recorded: 25.24 Nutritional Status: BMI 25 -29 Overweight Nutritional Risks: None Diabetes: Yes CBG done?: No Did pt. bring in CBG monitor from home?: No  How often do you need to have someone help you when you read instructions, pamphlets, or other written materials from your doctor or pharmacy?: 1 - Never  Interpreter Needed?: No  Information entered by :: R. Byrne Capek LPN   Activities of Daily Living    10/19/2022    9:35 AM 07/22/2022    8:19 AM  In your  present state of health, do you have any difficulty performing the following activities:  Hearing? 1 1  Comment history of ear surgery   Vision? 1 1  Comment has issues because of diabetes per patient   Difficulty concentrating or making decisions? 1 1  Comment at times   Walking or climbing stairs? 1 1  Comment history of stroke   Dressing or bathing? 0 1  Doing errands, shopping? 0 0  Preparing Food and eating ? N   Using the Toilet? N   In the past six months, have you accidently leaked urine? Y   Comment at times   Do you have problems with loss of bowel control? N   Managing your Medications? N   Managing your Finances? N   Housekeeping or managing your Housekeeping? N     Patient Care Team: Erasmo Downer, MD as PCP - General (Family Medicine) Wynelle Fanny, MD as Referring Physician (Nephrology) Kandyce Rud., MD (Rheumatology) Alwyn Pea, MD as Consulting Physician (Cardiology) Dingeldein, Viviann Spare, MD (Ophthalmology)  Indicate any recent Medical Services you may have received from other than [facility] providers in the past year (date may be approximate).     Assessment:   This is a routine wellness examination for Kelli Gutierrez.  Hearing/Vision screen No results found.  Dietary issues and exercise activities discussed:     Goals Addressed             This Visit's Progress    Patient Stated       None       Depression Screen    10/19/2022    9:43  AM 07/22/2022    8:19 AM 06/15/2022    9:38 AM 01/19/2022   10:40 AM 10/15/2021   10:18 AM 09/03/2021    1:33 PM 07/23/2021    9:24 AM  PHQ 2/9 Scores  PHQ - 2 Score 4 3 4 3 2 2 3   PHQ- 9 Score 11 13 12 10 5 5 13     Fall Risk    07/22/2022    8:19 AM 06/15/2022    9:38 AM 01/19/2022   10:40 AM 10/15/2021   10:22 AM 07/23/2021    9:25 AM  Fall Risk   Falls in the past year? 1 0 1 1 1   Number falls in past yr: 0 0 0 1 0  Injury with Fall? 1 0 1 1 0  Risk for fall due to : History of fall(s) No Fall Risks History of fall(s) History of fall(s) No Fall Risks  Follow up Falls evaluation completed Falls evaluation completed Falls evaluation completed Falls evaluation completed;Falls prevention discussed Falls evaluation completed    MEDICARE RISK AT HOME:          10/19/2022    9:52 AM 10/09/2020   10:24 AM 08/08/2019   11:57 AM  6CIT Screen  What Year? 0 points 0 points 0 points  What month? 0 points 0 points 0 points  What time? 0 points 0 points 0 points  Count back from 20 0 points 0 points 0 points  Months in reverse 2 points 2 points 0 points  Repeat phrase 4 points 0 points 2 points  Total Score 6 points 2 points 2 points    Immunizations Immunization History  Administered Date(s) Administered   Fluad Quad(high Dose 65+) 03/24/2020, 02/16/2021, 01/21/2022   PFIZER(Purple Top)SARS-COV-2 Vaccination 07/06/2019, 08/01/2019, 03/10/2020   Pneumococcal Conjugate-13 02/13/2018   Pneumococcal Polysaccharide-23 09/16/2015   Td 10/21/1993,  11/24/1993, 01/04/1995   Tdap 01/20/2021    TDAP status: Up to date  Flu Vaccine status: Up to date  Pneumococcal vaccine status: Up to date  Covid-19 vaccine status: Completed vaccines  Qualifies for Shingles Vaccine? Yes   Zostavax completed No   Shingrix Completed?: No.    Education has been provided regarding the importance of this vaccine. Patient has been advised to call insurance company to determine out of pocket expense if they  have not yet received this vaccine. Advised may also receive vaccine at local pharmacy or Health Dept. Verbalized acceptance and understanding.  Screening Tests Health Maintenance  Topic Date Due   Zoster Vaccines- Shingrix (1 of 2) Never done   COVID-19 Vaccine (4 - 2023-24 season) 12/11/2021   MAMMOGRAM  10/17/2022   INFLUENZA VACCINE  11/11/2022   FOOT EXAM  01/20/2023   HEMOGLOBIN A1C  01/21/2023   Fecal DNA (Cologuard)  02/10/2023   OPHTHALMOLOGY EXAM  02/25/2023   Diabetic kidney evaluation - eGFR measurement  07/13/2023   Diabetic kidney evaluation - Urine ACR  07/13/2023   Medicare Annual Wellness (AWV)  10/19/2023   DTaP/Tdap/Td (5 - Td or Tdap) 01/21/2031   Pneumonia Vaccine 7+ Years old  Completed   DEXA SCAN  Completed   Hepatitis C Screening  Completed   HPV VACCINES  Aged Out    Health Maintenance  Health Maintenance Due  Topic Date Due   Zoster Vaccines- Shingrix (1 of 2) Never done   COVID-19 Vaccine (4 - 2023-24 season) 12/11/2021   MAMMOGRAM  10/17/2022    Colorectal cancer screening: Type of screening: Cologuard. Completed 10/21. Repeat every 3 years  Mammogram status: Completed 7/22. Repeat every year. Patient wants to discuss with PCP at next visit  Bone Density status: Completed 9/21. Results reflect: Bone density results: OSTEOPENIA. Repeat every 2 years. Patient wants to discuss with PCP at next visit  Lung Cancer Screening: (Low Dose CT Chest recommended if Age 46-80 years, 20 pack-year currently smoking OR have quit w/in 15years.) does not qualify.   Additional Screening:  Hepatitis C Screening: does qualify; Completed 1/21  Vision Screening: Recommended annual ophthalmology exams for early detection of glaucoma and other disorders of the eye. Is the patient up to date with their annual eye exam?  Yes  Who is the provider or what is the name of the office in which the patient attends annual eye exams? [facility] If pt is not  established with a provider, would they like to be referred to a provider to establish care? No .   Dental Screening: Recommended annual dental exams for proper oral hygiene  Diabetic Foot Exam: Diabetic Foot Exam: Completed 10/23  Community Resource Referral / Chronic Care Management: CRR required this visit?  No   CCM required this visit?  No     Plan:     I have personally reviewed and noted the following in the patient's chart:   Medical and social history Use of alcohol, tobacco or illicit drugs  Current medications and supplements including opioid prescriptions. Patient is currently taking opioid prescriptions. Information provided to patient regarding non-opioid alternatives. Patient advised to discuss non-opioid treatment plan with their provider. Functional ability and status Nutritional status Physical activity Advanced directives List of other physicians Hospitalizations, surgeries, and ER visits in previous 12 months Vitals Screenings to include cognitive, depression, and falls Referrals and appointments  In addition, I have reviewed and discussed with patient certain preventive protocols, quality metrics, and best  practice recommendations. A written personalized care plan for preventive services as well as general preventive health recommendations were provided to patient.     Sydell Axon, LPN   04/17/1094   After Visit Summary: (Declined) Due to this being a telephonic visit, with patients personalized plan was offered to patient but patient Declined AVS at this time   Nurse Notes: Patient wants to discuss with PCP when she needs to schedule her mammogram and next Dexa Scan. Patient has an upcoming appointment scheduled with PCP.

## 2022-10-25 ENCOUNTER — Ambulatory Visit: Payer: Medicare Other | Admitting: Family Medicine

## 2022-10-25 ENCOUNTER — Encounter: Payer: Self-pay | Admitting: Family Medicine

## 2022-10-25 VITALS — BP 121/77 | HR 75 | Temp 98.2°F | Resp 13 | Ht 62.0 in | Wt 139.8 lb

## 2022-10-25 DIAGNOSIS — E1142 Type 2 diabetes mellitus with diabetic polyneuropathy: Secondary | ICD-10-CM

## 2022-10-25 DIAGNOSIS — E785 Hyperlipidemia, unspecified: Secondary | ICD-10-CM

## 2022-10-25 DIAGNOSIS — E1165 Type 2 diabetes mellitus with hyperglycemia: Secondary | ICD-10-CM | POA: Diagnosis not present

## 2022-10-25 DIAGNOSIS — E1169 Type 2 diabetes mellitus with other specified complication: Secondary | ICD-10-CM

## 2022-10-25 DIAGNOSIS — I69328 Other speech and language deficits following cerebral infarction: Secondary | ICD-10-CM

## 2022-10-25 DIAGNOSIS — E1159 Type 2 diabetes mellitus with other circulatory complications: Secondary | ICD-10-CM

## 2022-10-25 DIAGNOSIS — R7989 Other specified abnormal findings of blood chemistry: Secondary | ICD-10-CM

## 2022-10-25 DIAGNOSIS — I152 Hypertension secondary to endocrine disorders: Secondary | ICD-10-CM

## 2022-10-25 DIAGNOSIS — Z8673 Personal history of transient ischemic attack (TIA), and cerebral infarction without residual deficits: Secondary | ICD-10-CM

## 2022-10-25 DIAGNOSIS — M792 Neuralgia and neuritis, unspecified: Secondary | ICD-10-CM

## 2022-10-25 LAB — POCT GLYCOSYLATED HEMOGLOBIN (HGB A1C): Hemoglobin A1C: 8.1 % — AB (ref 4.0–5.6)

## 2022-10-25 MED ORDER — PREGABALIN 25 MG PO CAPS: 25.0000 mg | ORAL_CAPSULE | Freq: Two times a day (BID) | ORAL | 5 refills | Status: DC

## 2022-10-25 NOTE — Assessment & Plan Note (Signed)
Atorvastatin was stopped due to elevated liver function tests. -If approved by Gastroenterology, restart Atorvastatin.

## 2022-10-25 NOTE — Progress Notes (Signed)
Established Patient Office Visit  Subjective   Patient ID: Kelli Gutierrez, female    DOB: 03-Oct-1950  Age: 72 y.o. MRN: [id number]  Chief Complaint  Patient presents with   Medical Management of Chronic Issues    Patient states she would like to go over blood work that was done at [place] [facility] that was done on 6/18    HPI  Discussed the use of AI scribe software for clinical note transcription with the patient, who gave verbal consent to proceed.  History of Present Illness   The patient, with a history of diabetes, liver disease, and two strokes, presents with concerns about their A1c levels and liver function. They report that their rheumatologist recently conducted blood work, but they are seeking more detailed information about their results. They express concern about the effects of their medication, metformin, on their liver, and are considering restarting it if their liver function tests are normal.  The patient also reports difficulty with balance and walking, describing a sensation of wobbling and feeling as if they are going to fall. They have not fallen while walking, but express concern about their balance. They did PT extensively without help and are not interested in it again.  They also describe a persistent tingling sensation in their body, particularly on the right side, which they attribute to their second stroke. They report that this sensation is worse than after their first stroke, and it affects their ability to drive and perform daily activities.  The patient is due to see a gastroenterologist and a kidney doctor in the near future. They express a desire to avoid insulin shots if possible, and hope to manage their diabetes with metformin.         ROS per HPI    Objective:     BP 121/77 (BP Location: Left Arm, Patient Position: Sitting, Cuff Size: Normal)   Pulse 75   Temp 98.2 F (36.8 C)   Resp 13   Ht 5\' 2"  (1.575 m)   Wt 139 lb 12.8 oz (63.4 kg)    SpO2 97%   BMI 25.57 kg/m    Physical Exam   Results for orders placed or performed in visit on 10/25/22  POCT HgB A1C  Result Value Ref Range   Hemoglobin A1C 8.1 (A) 4.0 - 5.6 %   HbA1c POC (<> result, manual entry)     HbA1c, POC (prediabetic range)     HbA1c, POC (controlled diabetic range)        The ASCVD Risk score (Arnett DK, et al., 2019) failed to calculate for the following reasons:   The patient has a prior MI or stroke diagnosis     Assessment & Plan:   Problem List Items Addressed This Visit       Cardiovascular and Mediastinum   Hypertension associated with diabetes (HCC) - Primary    Well controlled Continue current medications        Endocrine   Type 2 diabetes mellitus with hyperglycemia, without long-term current use of insulin (HCC)    A1c improving, but still elevated Hyperglycemia reported with intermittent use of Metformin due to concerns about liver function. A1c checked today. -If Gastroenterology approves, restart Metformin XR 500mg  daily after appointment on 11/02/2022. -Continue current regimen of Rybelsus 14mg  daily, Jardiance 25mg  daily, and Glipizide 5mg  twice daily.      Relevant Orders   POCT HgB A1C (Completed)   Hyperlipidemia associated with type 2 diabetes mellitus (HCC)  Atorvastatin was stopped due to elevated liver function tests. -If approved by Gastroenterology, restart Atorvastatin.      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 -Resume Lyrica 25mg  twice daily to help with tingling sensation. -Consider referral to Physical Therapy for balance issues if symptoms persist.        Other   Neuropathic pain (Chronic)   History of CVA  (cerebrovascular accident)   LFT elevation       Reviewed labs and [place] from [facility]  Liver Disease: Elevated liver function tests and ultrasound suggestive of possible cirrhosis. Metformin and Atorvastatin were stopped. -Follow up with Gastroenterology on 11/02/2022. -If approved by Gastroenterology, restart Metformin XR 500mg  daily and Atorvastatin.  Chronic Kidney Disease: Stable, next follow-up with Nephrology in November. -Continue current medications as prescribed.         Return in about 3 months (around 01/25/2023) for chronic disease f/u.    Total time spent on today's visit was greater than 40 minutes, including both face-to-face time and nonface-to-face time personally spent on review of chart (labs and imaging), discussing labs and goals, discussing further work-up, treatment options, reviewing outside records, answering patient's questions, and coordinating care.   Shirlee Latch, MD

## 2022-10-25 NOTE — Assessment & Plan Note (Signed)
Reports of imbalance and tingling sensation on the right side, worse than after the first stroke. -Resume Lyrica 25mg  twice daily to help with tingling sensation. -Consider referral to Physical Therapy for balance issues if symptoms persist.

## 2022-10-25 NOTE — Assessment & Plan Note (Signed)
A1c improving, but still elevated Hyperglycemia reported with intermittent use of Metformin due to concerns about liver function. A1c checked today. -If Gastroenterology approves, restart Metformin XR 500mg  daily after appointment on 11/02/2022. -Continue current regimen of Rybelsus 14mg  daily, Jardiance 25mg  daily, and Glipizide 5mg  twice daily.

## 2022-10-25 NOTE — Assessment & Plan Note (Signed)
Resume lyrica

## 2022-10-25 NOTE — Assessment & Plan Note (Signed)
 Well controlled. Continue current medications  

## 2022-11-17 ENCOUNTER — Other Ambulatory Visit: Payer: Self-pay | Admitting: Gastroenterology

## 2022-11-17 DIAGNOSIS — R7989 Other specified abnormal findings of blood chemistry: Secondary | ICD-10-CM

## 2022-11-23 ENCOUNTER — Ambulatory Visit: Payer: Medicare Other

## 2022-11-24 ENCOUNTER — Ambulatory Visit
Admission: RE | Admit: 2022-11-24 | Discharge: 2022-11-24 | Disposition: A | Discharge status: Home / Self Care | Payer: Medicare Other | Source: Ambulatory Visit | Attending: Gastroenterology | Admitting: Gastroenterology

## 2022-11-24 DIAGNOSIS — R7989 Other specified abnormal findings of blood chemistry: Secondary | ICD-10-CM | POA: Diagnosis not present

## 2022-12-12 ENCOUNTER — Other Ambulatory Visit: Payer: Self-pay | Admitting: Family Medicine

## 2022-12-23 ENCOUNTER — Other Ambulatory Visit: Payer: Self-pay | Admitting: Family Medicine

## 2022-12-23 DIAGNOSIS — Z8673 Personal history of transient ischemic attack (TIA), and cerebral infarction without residual deficits: Secondary | ICD-10-CM

## 2023-01-26 ENCOUNTER — Other Ambulatory Visit: Payer: Self-pay | Admitting: Family Medicine

## 2023-01-26 NOTE — Telephone Encounter (Signed)
Requested medication (s) are due for refill today: routing for review  Requested medication (s) are on the active medication list: yes  Last refill:  07/19/22  Future visit scheduled: yes  Notes to clinic:  Patient not taking:  Reported on 10/25/2022, routing for review     Requested Prescriptions  Pending Prescriptions Disp Refills   metoprolol succinate (TOPROL-XL) 25 MG 24 hr tablet [Pharmacy Med Name: Metoprolol Succinate ER 25 MG Oral Tablet Extended Release 24 Hour] 90 tablet 0    Sig: Take 1 tablet by mouth once daily     Cardiovascular:  Beta Blockers Passed - 01/26/2023  9:41 AM      Passed - Last BP in normal range    BP Readings from Last 1 Encounters:  10/25/22 121/77         Passed - Last Heart Rate in normal range    Pulse Readings from Last 1 Encounters:  10/25/22 75         Passed - Valid encounter within last 6 months    Recent Outpatient Visits           3 months ago Hypertension associated with diabetes [facility])   [place] [facility] [place], Marzella Schlein, MD   3 months ago Encounter for [employer] annual wellness exam   [place] [facility] [place], Marzella Schlein, MD   6 months ago Type 2 diabetes mellitus with hyperglycemia, without long-term current use of insulin [facility])   [place] [facility] [place], Marzella Schlein, MD   7 months ago Type 2 diabetes mellitus with hyperglycemia, without long-term current use of insulin [facility]    [facility] [place], Marzella Schlein, MD   9 months ago COVID   [facility] [place], Marzella Schlein, MD       Future Appointments             Tomorrow [place], Marzella Schlein, MD [facility]

## 2023-01-27 ENCOUNTER — Ambulatory Visit: Payer: Medicare Other | Admitting: Family Medicine

## 2023-01-27 ENCOUNTER — Encounter: Payer: Self-pay | Admitting: Family Medicine

## 2023-01-27 VITALS — BP 138/60 | HR 74 | Temp 98.0°F | Resp 20 | Ht 62.0 in | Wt 132.5 lb

## 2023-01-27 DIAGNOSIS — D631 Anemia in chronic kidney disease: Secondary | ICD-10-CM

## 2023-01-27 DIAGNOSIS — E1165 Type 2 diabetes mellitus with hyperglycemia: Secondary | ICD-10-CM

## 2023-01-27 DIAGNOSIS — Z23 Encounter for immunization: Secondary | ICD-10-CM | POA: Diagnosis not present

## 2023-01-27 DIAGNOSIS — Z7984 Long term (current) use of oral hypoglycemic drugs: Secondary | ICD-10-CM

## 2023-01-27 DIAGNOSIS — N184 Chronic kidney disease, stage 4 (severe): Secondary | ICD-10-CM | POA: Diagnosis not present

## 2023-01-27 DIAGNOSIS — E1169 Type 2 diabetes mellitus with other specified complication: Secondary | ICD-10-CM

## 2023-01-27 DIAGNOSIS — I152 Hypertension secondary to endocrine disorders: Secondary | ICD-10-CM | POA: Diagnosis not present

## 2023-01-27 DIAGNOSIS — F321 Major depressive disorder, single episode, moderate: Secondary | ICD-10-CM

## 2023-01-27 DIAGNOSIS — E1159 Type 2 diabetes mellitus with other circulatory complications: Secondary | ICD-10-CM | POA: Diagnosis not present

## 2023-01-27 DIAGNOSIS — E785 Hyperlipidemia, unspecified: Secondary | ICD-10-CM

## 2023-01-27 MED ORDER — LANCET DEVICES MISC: 3 refills | Status: DC

## 2023-01-27 NOTE — Assessment & Plan Note (Signed)
Well-controlled on Atorvastatin 20mg  daily. -Continue Atorvastatin 20mg  daily.

## 2023-01-27 NOTE — Progress Notes (Signed)
Established Patient Office Visit  Subjective   Patient ID: Kelli Gutierrez, female    DOB: [birth year]  Age: 72 y.o. MRN: [id number]  Chief Complaint  Patient presents with   Follow-up    3 mo f/u  Had a fall a couple of months ago pt did not go to the ED stated she didn't hurt anything Needs refill on accu-chek softclix lancets    HPI  Discussed the use of AI scribe software for clinical note transcription with the patient, who gave verbal consent to proceed.  History of Present Illness   The patient, with a history of diabetes, hypertension, and hyperlipidemia, presents for a routine follow-up. He reports a fall a couple of months ago, where he slid off a chair onto the floor. He experienced minor discomfort but did not seek medical attention and has since recovered with no residual issues.  He also reports an insect bite on his arm two to three days ago, which has resulted in localized swelling and itching. He has been managing the symptoms with alcohol but has not identified the insect responsible for the bite.  The patient has noticed significant weight loss, dropping from 139 to 132 pounds. He reports eating normally but has experienced nausea associated with metformin use, which sometimes affects his appetite.  He also reports chronic shoulder pain, which has been ongoing for a month or two. He is scheduled to see a rheumatologist for further evaluation and management of this issue.         ROS    Objective:     BP 138/60 (BP Location: Left Arm, Patient Position: Sitting, Cuff Size: Normal)   Pulse 74   Temp 98 F (36.7 C)   Resp 20   Ht 5\' 2"  (1.575 m)   Wt 132 lb 8 oz (60.1 kg)   SpO2 100%   BMI 24.23 kg/m    Physical Exam Vitals reviewed.  Constitutional:      General: She is not in acute distress.    Appearance: Normal appearance. She is well-developed. She is not diaphoretic.  HENT:     Head: Normocephalic and atraumatic.  Eyes:     General: No scleral  icterus.    Conjunctiva/sclera: Conjunctivae normal.  Neck:     Thyroid: No thyromegaly.  Cardiovascular:     Rate and Rhythm: Normal rate and regular rhythm.     Heart sounds: Normal heart sounds. No murmur heard. Pulmonary:     Effort: Pulmonary effort is normal. No respiratory distress.     Breath sounds: Normal breath sounds. No wheezing, rhonchi or rales.  Musculoskeletal:     Cervical back: Neck supple.     Right lower leg: No edema.     Left lower leg: No edema.  Lymphadenopathy:     Cervical: No cervical adenopathy.  Skin:    General: Skin is warm and dry.     Findings: No rash.  Neurological:     Mental Status: She is alert and oriented to person, place, and time. Mental status is at baseline.  Psychiatric:        Mood and Affect: Mood normal.        Behavior: Behavior normal.      No results found for any visits on 01/27/23.    The ASCVD Risk score (Arnett DK, et al., 2019) failed to calculate for the following reasons:   The patient has a prior MI or stroke diagnosis    Assessment & Plan:  Problem List Items Addressed This Visit       Cardiovascular and Mediastinum   Hypertension associated with diabetes (HCC) - Primary    Patient ran out of Metoprolol, which may have contributed to elevated blood pressure at today's visit. -Continue Metoprolol as prescribed. -Ensure patient has picked up refill.      Relevant Medications   metFORMIN (GLUCOPHAGE-XR) 500 MG 24 hr tablet     Endocrine   Type 2 diabetes mellitus with hyperglycemia, without long-term current use of insulin (HCC)    Last A1c elevated on current regimen of Jardiance, Glipizide, rybelsus and Metformin. -Check A1C today. -Continue current medications.      Relevant Medications   metFORMIN (GLUCOPHAGE-XR) 500 MG 24 hr tablet   Lancet Devices MISC   Other Relevant Orders   Hemoglobin A1c   Hyperlipidemia associated with type 2 diabetes mellitus (HCC)    Well-controlled on Atorvastatin  20mg  daily. -Continue Atorvastatin 20mg  daily.      Relevant Medications   metFORMIN (GLUCOPHAGE-XR) 500 MG 24 hr tablet     Genitourinary   CKD (chronic kidney disease) stage 4, GFR 15-29 ml/min (HCC)   Anemia in stage 4 chronic kidney disease (HCC)     Other   Current moderate episode of major depressive disorder without prior episode (HCC)   Other Visit Diagnoses     Immunization due       Relevant Orders   Flu Vaccine Trivalent High Dose (Fluad) (Completed)   Influenza vaccine needed       Relevant Orders   Flu Vaccine Trivalent High Dose (Fluad) (Completed)           Insect Bite Recent insect bite with localized swelling and itching. No signs of systemic reaction. -Apply over-the-counter hydrocortisone cream to reduce redness and itching.  Unintentional Weight Loss Patient reports unintentional weight loss, possibly related to nausea from Metformin. No changes in appetite or eating habits. -Continue current diabetes medications:Jardiance 25mg  daily, Glipizide 5mg  twice daily, rybelsus 14 mg daily and Metformin 500mg  extended release daily. -Consider alternatives to Metformin if nausea persists and continues to affect weight. -Schedule follow-up in 3 months to monitor weight.  Shoulder Pain Chronic shoulder pain, currently managed by rheumatology. -Continue follow-up with rheumatology (next appointment on 02/01/2023).  General Health Maintenance -Administer influenza vaccine today. -Continue regular follow-up with nephrology (next appointment on 02/21/2023).        Return in about 3 months (around 04/29/2023) for chronic disease f/u.    Shirlee Latch, MD

## 2023-01-27 NOTE — Assessment & Plan Note (Signed)
Patient ran out of Metoprolol, which may have contributed to elevated blood pressure at today's visit. -Continue Metoprolol as prescribed. -Ensure patient has picked up refill.

## 2023-01-27 NOTE — Assessment & Plan Note (Signed)
Last A1c elevated on current regimen of Jardiance, Glipizide, rybelsus and Metformin. -Check A1C today. -Continue current medications.

## 2023-01-28 LAB — HEMOGLOBIN A1C
Est. average glucose Bld gHb Est-mCnc: 180 mg/dL
Hgb A1c MFr Bld: 7.9 % — ABNORMAL HIGH (ref 4.8–5.6)

## 2023-02-05 ENCOUNTER — Emergency Department
Admission: EM | Admit: 2023-02-05 | Discharge: 2023-02-05 | Disposition: A | Discharge status: Home / Self Care | Payer: No Typology Code available for payment source | Attending: Emergency Medicine | Admitting: Emergency Medicine

## 2023-02-05 ENCOUNTER — Encounter: Payer: Self-pay | Admitting: Emergency Medicine

## 2023-02-05 ENCOUNTER — Emergency Department: Payer: No Typology Code available for payment source

## 2023-02-05 ENCOUNTER — Other Ambulatory Visit: Payer: Self-pay

## 2023-02-05 DIAGNOSIS — Z8673 Personal history of transient ischemic attack (TIA), and cerebral infarction without residual deficits: Secondary | ICD-10-CM | POA: Diagnosis not present

## 2023-02-05 DIAGNOSIS — E119 Type 2 diabetes mellitus without complications: Secondary | ICD-10-CM | POA: Diagnosis not present

## 2023-02-05 DIAGNOSIS — I10 Essential (primary) hypertension: Secondary | ICD-10-CM | POA: Insufficient documentation

## 2023-02-05 DIAGNOSIS — M545 Low back pain, unspecified: Secondary | ICD-10-CM | POA: Insufficient documentation

## 2023-02-05 DIAGNOSIS — Y92410 Unspecified street and highway as the place of occurrence of the external cause: Secondary | ICD-10-CM | POA: Insufficient documentation

## 2023-02-05 MED ORDER — LIDOCAINE 5 % EX PTCH: 1.0000 | MEDICATED_PATCH | Freq: Two times a day (BID) | CUTANEOUS | 0 refills | Status: DC
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
## 2023-02-07 ENCOUNTER — Telehealth: Payer: Self-pay | Admitting: *Deleted

## 2023-02-07 NOTE — Telephone Encounter (Signed)
error 

## 2023-02-25 ENCOUNTER — Other Ambulatory Visit: Payer: Self-pay | Admitting: Family Medicine

## 2023-02-28 NOTE — Telephone Encounter (Signed)
Requested medications are due for refill today.  yes  Requested medications are on the active medications list.  yes  Last refill. 08/19/2022 #90 1 rf  Future visit scheduled.   yes  Notes to clinic.  Medication not assigned to a protocol. Please review for refill.    Requested Prescriptions  Pending Prescriptions Disp Refills   RYBELSUS 14 MG TABS [Pharmacy Med Name: Rybelsus 14 MG Oral Tablet] 90 tablet 0    Sig: Take 1 tablet by mouth once daily     Off-Protocol Failed - 02/25/2023  6:15 PM      Failed - Medication not assigned to a protocol, review manually.      Passed - Valid encounter within last 12 months    Recent Outpatient Visits           1 month ago Hypertension associated with diabetes [facility])   [place] [facility] [place], Marzella Schlein, MD   4 months ago Hypertension associated with diabetes [facility])   [place] [facility] [place], Marzella Schlein, MD   4 months ago Encounter for [employer] annual wellness exam   [place] [facility] [place], Marzella Schlein, MD   7 months ago Type 2 diabetes mellitus with hyperglycemia, without long-term current use of insulin [facility])   [place] [facility] [place], Marzella Schlein, MD   8 months ago Type 2 diabetes mellitus with hyperglycemia, without long-term current use of insulin [facility])   [place] [facility] [place], Marzella Schlein, MD       Future Appointments             In 1 month [place], Marzella Schlein, MD [facility], [facility]

## 2023-03-17 ENCOUNTER — Other Ambulatory Visit: Payer: Self-pay | Admitting: Family Medicine

## 2023-03-17 DIAGNOSIS — Z8673 Personal history of transient ischemic attack (TIA), and cerebral infarction without residual deficits: Secondary | ICD-10-CM

## 2023-03-18 NOTE — Telephone Encounter (Signed)
Requested Prescriptions  Pending Prescriptions Disp Refills   clopidogrel (PLAVIX) 75 MG tablet [Pharmacy Med Name: Clopidogrel Bisulfate 75 MG Oral Tablet] 90 tablet 0    Sig: Take 1 tablet by mouth once daily     Hematology: Antiplatelets - clopidogrel Failed - 03/17/2023  1:12 PM      Failed - HCT in normal range and within 180 days    HCT  Date Value Ref Range Status  07/13/2022 36 36 - 46 Final   Hematocrit  Date Value Ref Range Status  06/16/2021 36.1 34.0 - 46.6 % Final         Failed - HGB in normal range and within 180 days    Hemoglobin  Date Value Ref Range Status  07/13/2022 12.0 12.0 - 16.0 Final  06/16/2021 11.6 11.1 - 15.9 g/dL Final         Failed - PLT in normal range and within 180 days    Platelets  Date Value Ref Range Status  07/13/2022 173 150 - 400 K/uL Final  06/16/2021 264 150 - 450 x10E3/uL Final         Failed - Cr in normal range and within 360 days    Creatinine  Date Value Ref Range Status  07/13/2022 1.8 (A) 0.5 - 1.1 Final  10/10/2012 0.93 0.60 - 1.30 mg/dL Final   Creatinine, Ser  Date Value Ref Range Status  06/16/2021 2.23 (H) 0.57 - 1.00 mg/dL Final   Creatinine, Urine  Date Value Ref Range Status  07/13/2022 38.2  Final         Passed - Valid encounter within last 6 months    Recent Outpatient Visits           1 month ago Hypertension associated with diabetes [facility])   [place] [facility] [place], Marzella Schlein, MD   4 months ago Hypertension associated with diabetes [facility])   [place] [facility] [place], Marzella Schlein, MD   5 months ago Encounter for [employer] annual wellness exam   [place] [facility] [place], Marzella Schlein, MD   7 months ago Type 2 diabetes mellitus with hyperglycemia, without long-term current use of insulin [facility])   [place] [facility] [place], Marzella Schlein, MD   9 months ago Type 2 diabetes mellitus with hyperglycemia, without long-term  current use of insulin [facility])   [place] [facility] [place], Marzella Schlein, MD       Future Appointments             In 1 month [place], Marzella Schlein, MD [facility], [facility]

## 2023-04-04 ENCOUNTER — Other Ambulatory Visit: Payer: Self-pay | Admitting: Family Medicine

## 2023-04-19 DIAGNOSIS — R69 Illness, unspecified: Secondary | ICD-10-CM | POA: Diagnosis not present

## 2023-04-28 ENCOUNTER — Encounter: Payer: Self-pay | Admitting: Family Medicine

## 2023-04-28 ENCOUNTER — Ambulatory Visit (INDEPENDENT_AMBULATORY_CARE_PROVIDER_SITE_OTHER): Payer: Medicare HMO | Admitting: Family Medicine

## 2023-04-28 VITALS — BP 134/60 | HR 75 | Ht 62.0 in | Wt 130.1 lb

## 2023-04-28 DIAGNOSIS — E1165 Type 2 diabetes mellitus with hyperglycemia: Secondary | ICD-10-CM | POA: Diagnosis not present

## 2023-04-28 DIAGNOSIS — M069 Rheumatoid arthritis, unspecified: Secondary | ICD-10-CM | POA: Diagnosis not present

## 2023-04-28 DIAGNOSIS — I152 Hypertension secondary to endocrine disorders: Secondary | ICD-10-CM

## 2023-04-28 DIAGNOSIS — I69359 Hemiplegia and hemiparesis following cerebral infarction affecting unspecified side: Secondary | ICD-10-CM | POA: Diagnosis not present

## 2023-04-28 DIAGNOSIS — N184 Chronic kidney disease, stage 4 (severe): Secondary | ICD-10-CM

## 2023-04-28 DIAGNOSIS — E1159 Type 2 diabetes mellitus with other circulatory complications: Secondary | ICD-10-CM

## 2023-04-28 DIAGNOSIS — E1169 Type 2 diabetes mellitus with other specified complication: Secondary | ICD-10-CM | POA: Diagnosis not present

## 2023-04-28 DIAGNOSIS — Z1211 Encounter for screening for malignant neoplasm of colon: Secondary | ICD-10-CM

## 2023-04-28 DIAGNOSIS — D631 Anemia in chronic kidney disease: Secondary | ICD-10-CM

## 2023-04-28 DIAGNOSIS — J449 Chronic obstructive pulmonary disease, unspecified: Secondary | ICD-10-CM

## 2023-04-28 DIAGNOSIS — E785 Hyperlipidemia, unspecified: Secondary | ICD-10-CM

## 2023-04-28 DIAGNOSIS — D692 Other nonthrombocytopenic purpura: Secondary | ICD-10-CM

## 2023-04-28 DIAGNOSIS — E1142 Type 2 diabetes mellitus with diabetic polyneuropathy: Secondary | ICD-10-CM | POA: Diagnosis not present

## 2023-04-28 DIAGNOSIS — Z1231 Encounter for screening mammogram for malignant neoplasm of breast: Secondary | ICD-10-CM

## 2023-04-28 LAB — POCT GLYCOSYLATED HEMOGLOBIN (HGB A1C): Hemoglobin A1C: 7.8 % — AB (ref 4.0–5.6)

## 2023-04-28 NOTE — Assessment & Plan Note (Signed)
Chronic and stable.   

## 2023-04-28 NOTE — Assessment & Plan Note (Signed)
Chronic and stable Continue to monitor 

## 2023-04-28 NOTE — Assessment & Plan Note (Signed)
Chronic and well controlled Not currently on medications

## 2023-04-28 NOTE — Assessment & Plan Note (Addendum)
Rheumatoid arthritis, managed with Plaquenil 200 mg daily. Reports ongoing pain in hands and arms. Under care of rheumatologist. Discussed importance of regular follow-up with rheumatologist to manage symptoms and prevent joint damage. - Continue Plaquenil 200 mg daily - Follow up with rheumatologist as scheduled

## 2023-04-28 NOTE — Patient Instructions (Signed)
Call [facility] to schedule a mammogram [phone]  

## 2023-04-28 NOTE — Assessment & Plan Note (Signed)
Chronic kidney disease stage 4, well-managed with recent labs reviewed from nephrologist at [facility]. No new blood work needed at this time. - Continue current management - Follow up with nephrologist as scheduled

## 2023-04-28 NOTE — Assessment & Plan Note (Signed)
Hypertension, managed with metoprolol XL 25 mg daily. Blood pressure today was 134 mmHg systolic after initial elevation due to anxiety. Discussed importance of regular blood pressure monitoring to prevent complications such as stroke and heart disease. - Continue metoprolol XL 25 mg daily - Recheck blood pressure at next visit

## 2023-04-28 NOTE — Assessment & Plan Note (Signed)
Diabetic neuropathy, previously managed with Lyrica 25 mg BID, discontinued due to polypharmacy concerns. Currently not taking Lyrica. Discussed potential reinitiation if symptoms worsen and importance of managing neuropathy to prevent complications. - Discuss potential reinitiation of Lyrica at next visit

## 2023-04-28 NOTE — Assessment & Plan Note (Signed)
Type 2 diabetes mellitus with recent HbA1c of 7.8%, improved from previous levels in the 9% range. Inconsistent medication adherence, particularly with Jardiance, may be contributing to suboptimal glycemic control. Goal is to achieve HbA1c below 7.0%. Discussed importance of regular medication adherence to improve glycemic control and reduce complications. - Encourage regular medication adherence - Recheck HbA1c in 3 months

## 2023-04-28 NOTE — Progress Notes (Signed)
Established patient visit   Patient: Kelli Gutierrez   DOB: 1951-03-31   73 y.o. Female  MRN: [id number] Visit Date: 04/28/2023  Today's healthcare provider: Shirlee Latch, MD   Chief Complaint  Patient presents with   Medical Management of Chronic Issues   Diabetes   Hypertension   Subjective    HPI HPI     Diabetes   Blurred vision: Present.  Fatigue: Present.  Nausea: Present.  Polydipsia: Present.  Visual changes: Present.  Weight loss: Present.  Eye exam is not current.  Patient does not see a podiatrist.  The patient never exercises.        Comments   Cologuard declined      Last edited by Acey Lav, CMA on 04/28/2023 10:47 AM.       Discussed the use of AI scribe software for clinical note transcription with the patient, who gave verbal consent to proceed.  History of Present Illness   A 73 year old patient with a history of CKD stage 4, anemia, type 2 diabetes, hypertension, hyperlipidemia, history of stroke, diabetic neuropathy, and rheumatoid arthritis presents with fatigue. The patient reports feeling "so high easy wise" and wonders if a vitamin supplement might help. The patient reports eating well, including meat, and acknowledges that her multiple chronic conditions could be contributing to her fatigue.  The patient has been prescribed Lyrica for diabetic neuropathy but has stopped taking it, stating she "takes too much medicine" and prefers to take only what's "more important." The patient reports poor sleep quality.  The patient's A1c has been checked and is reported as 7.8, which is a little high but the best it has been in a long time. The patient admits to occasionally skipping her diabetes medication, Jardiance, but plans to take it more regularly.  The patient also reports bruising easily, which she attributes to her Plavix and aspirin medication. She expresses concern about the bruising but understands the importance of these medications  in preventing further strokes.  The patient also has rheumatoid arthritis and reports that her hands and arms hurt all the time. She is currently seeing a rheumatologist for this condition.         Medications: Outpatient Medications Prior to Visit  Medication Sig   aspirin 81 MG chewable tablet Chew 81 mg by mouth daily.   atorvastatin (LIPITOR) 20 MG tablet Take 1 tablet by mouth once daily   blood glucose meter kit and supplies Accu-Chek Guide Me meter. To check blood sugar once daily. Dx: E11.65   clopidogrel (PLAVIX) 75 MG tablet Take 1 tablet by mouth once daily   glipiZIDE (GLUCOTROL) 5 MG tablet Take 1 tablet (5 mg total) by mouth 2 (two) times daily before a meal.   hydroxychloroquine (PLAQUENIL) 200 MG tablet Take 200 mg by mouth daily.   JARDIANCE 25 MG TABS tablet TAKE 1 TABLET BY MOUTH ONCE DAILY BEFORE BREAKFAST   Lancet Devices MISC Use to test blood glucose once daily. Dx: E11.65   lidocaine (LIDODERM) 5 % Place 1 patch onto the skin every 12 (twelve) hours. Remove & Discard patch within 12 hours or as directed by MD   metFORMIN (GLUCOPHAGE-XR) 500 MG 24 hr tablet Take 500 mg by mouth daily with breakfast.   metoprolol succinate (TOPROL-XL) 25 MG 24 hr tablet Take 1 tablet by mouth once daily   pregabalin (LYRICA) 25 MG capsule Take 1 capsule (25 mg total) by mouth 2 (two) times daily.   Semaglutide (RYBELSUS)  14 MG TABS Take 1 tablet by mouth once daily   No facility-administered medications prior to visit.    Review of Systems      Objective    BP 134/60 (BP Location: Left Arm, Patient Position: Sitting, Cuff Size: Normal)   Pulse 75   Ht 5\' 2"  (1.575 m)   Wt 130 lb 1.6 oz (59 kg)   SpO2 96%   BMI 23.80 kg/m    Physical Exam Vitals reviewed.  Constitutional:      General: She is not in acute distress.    Appearance: Normal appearance. She is well-developed. She is not diaphoretic.  HENT:     Head: Normocephalic and atraumatic.  Eyes:     General:  No scleral icterus.    Conjunctiva/sclera: Conjunctivae normal.  Neck:     Thyroid: No thyromegaly.  Cardiovascular:     Rate and Rhythm: Normal rate and regular rhythm.     Heart sounds: Normal heart sounds. No murmur heard. Pulmonary:     Effort: Pulmonary effort is normal. No respiratory distress.     Breath sounds: Normal breath sounds. No wheezing, rhonchi or rales.  Musculoskeletal:     Cervical back: Neck supple.     Right lower leg: No edema.     Left lower leg: No edema.  Lymphadenopathy:     Cervical: No cervical adenopathy.  Skin:    General: Skin is warm and dry.     Findings: No rash.  Neurological:     Mental Status: She is alert and oriented to person, place, and time. Mental status is at baseline.  Psychiatric:        Mood and Affect: Mood normal.        Behavior: Behavior normal.     Diabetic Foot Exam - Simple   Simple Foot Form Diabetic Foot exam was performed with the following findings: Yes 04/28/2023 11:02 AM  Visual Inspection No deformities, no ulcerations, no other skin breakdown bilaterally: Yes Sensation Testing Intact to touch and monofilament testing bilaterally: Yes Pulse Check Posterior Tibialis and Dorsalis pulse intact bilaterally: Yes Comments      Results for orders placed or performed in visit on 04/28/23  POCT HgB A1C  Result Value Ref Range   Hemoglobin A1C 7.8 (A) 4.0 - 5.6 %   HbA1c POC (<> result, manual entry)     HbA1c, POC (prediabetic range)     HbA1c, POC (controlled diabetic range)      Assessment & Plan     Problem List Items Addressed This Visit       Cardiovascular and Mediastinum   Hypertension associated with diabetes (HCC) - Primary   Hypertension, managed with metoprolol XL 25 mg daily. Blood pressure today was 134 mmHg systolic after initial elevation due to anxiety. Discussed importance of regular blood pressure monitoring to prevent complications such as stroke and heart disease. - Continue metoprolol XL  25 mg daily - Recheck blood pressure at next visit      Senile purpura (HCC)   Chronic and stable Continue to monitor        Respiratory   COPD, mild (HCC)   Chronic and well-controlled Not currently on medications        Endocrine   Type 2 diabetes mellitus with hyperglycemia, without long-term current use of insulin (HCC)   Type 2 diabetes mellitus with recent HbA1c of 7.8%, improved from previous levels in the 9% range. Inconsistent medication adherence, particularly with Jardiance, may be contributing to  suboptimal glycemic control. Goal is to achieve HbA1c below 7.0%. Discussed importance of regular medication adherence to improve glycemic control and reduce complications. - Encourage regular medication adherence - Recheck HbA1c in 3 months      Relevant Orders   POCT HgB A1C (Completed)   Hyperlipidemia associated with type 2 diabetes mellitus (HCC)   Hyperlipidemia, managed with atorvastatin 20 mg daily. Well controlled on recent lipid panel at [facility] - discussed. - Continue atorvastatin 20 mg daily      Diabetic peripheral neuropathy (HCC)   Diabetic neuropathy, previously managed with Lyrica 25 mg BID, discontinued due to polypharmacy concerns. Currently not taking Lyrica. Discussed potential reinitiation if symptoms worsen and importance of managing neuropathy to prevent complications. 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         Musculoskeletal and Integument   Rheumatoid arthritis (Chronic)   Rheumatoid arthritis, managed with Plaquenil 200 mg daily. Reports ongoing pain in hands and arms. Under care of rheumatologist. Discussed importance of regular follow-up with rheumatologist to manage symptoms and prevent joint damage. - Continue Plaquenil 200 mg daily - Follow up with rheumatologist as scheduled         Genitourinary   CKD (chronic kidney disease) stage 4, GFR 15-29 ml/min (HCC)   Chronic kidney disease stage 4, well-managed with recent labs reviewed from nephrologist at [facility]. No new blood work needed at this time. - Continue current management - Follow up with nephrologist as scheduled      Anemia in stage 4 chronic kidney disease (HCC)   Other Visit Diagnoses       Breast cancer screening by mammogram       Relevant Orders   MM 3D SCREENING MAMMOGRAM BILATERAL BREAST     Colon cancer screening       Relevant Orders   Cologuard           h/o Stroke Stroke, managed with Plavix 75 mg daily and baby aspirin daily to prevent recurrence. Reports easy bruising, a known side effect. Discussed necessity of continuing these medications to prevent another stroke despite bruising. - Continue Plavix 75 mg daily - Continue baby aspirin daily  General Health Maintenance Due for Cologuard test and mammogram. Discussed importance of regular cancer screenings for early detection and better outcomes. - Order Cologuard test - Provide phone number for mammogram scheduling  Follow-up - Schedule follow-up appointment in 3 months.       Return in about 3 months (around 07/27/2023) for chronic disease f/u.       Shirlee Latch, MD  [facility] [phone] (phone) [phone] (fax)  [facility]

## 2023-04-28 NOTE — Assessment & Plan Note (Addendum)
Hyperlipidemia, managed with atorvastatin 20 mg daily. Well controlled on recent lipid panel at [facility] - discussed. - Continue atorvastatin 20 mg daily

## 2023-05-05 ENCOUNTER — Other Ambulatory Visit: Payer: Self-pay | Admitting: Family Medicine

## 2023-05-05 NOTE — Telephone Encounter (Signed)
Requested Prescriptions  Pending Prescriptions Disp Refills   metoprolol succinate (TOPROL-XL) 25 MG 24 hr tablet [Pharmacy Med Name: Metoprolol Succinate ER 25 MG Oral Tablet Extended Release 24 Hour] 90 tablet 0    Sig: Take 1 tablet by mouth once daily     Cardiovascular:  Beta Blockers Passed - 05/05/2023  1:47 PM      Passed - Last BP in normal range    BP Readings from Last 1 Encounters:  04/28/23 134/60         Passed - Last Heart Rate in normal range    Pulse Readings from Last 1 Encounters:  04/28/23 75         Passed - Valid encounter within last 6 months    Recent Outpatient Visits           1 week ago Hypertension associated with diabetes [facility])   [place] [facility] [place], Marzella Schlein, MD   3 months ago Hypertension associated with diabetes [facility])   [place] [facility] [place], Marzella Schlein, MD   6 months ago Hypertension associated with diabetes [facility])   [place] [facility] [place], Marzella Schlein, MD   6 months ago Encounter for [employer] annual wellness exam   [place] [facility] [place], Marzella Schlein, MD   9 months ago Type 2 diabetes mellitus with hyperglycemia, without long-term current use of insulin [facility])   [place] [facility] [place], Marzella Schlein, MD       Future Appointments             In 3 months Bacigalupo, Marzella Schlein, MD [facility]

## 2023-05-10 DIAGNOSIS — M0579 Rheumatoid arthritis with rheumatoid factor of multiple sites without organ or systems involvement: Secondary | ICD-10-CM | POA: Diagnosis not present

## 2023-05-10 DIAGNOSIS — Z7960 Long term (current) use of unspecified immunomodulators and immunosuppressants: Secondary | ICD-10-CM | POA: Diagnosis not present

## 2023-06-12 ENCOUNTER — Other Ambulatory Visit: Payer: Self-pay | Admitting: Family Medicine

## 2023-06-13 NOTE — Telephone Encounter (Signed)
 Requested medication (s) are due for refill today: Yes  Requested medication (s) are on the active medication list: Yes  Last refill:  02/28/23  Future visit scheduled: Yes  Notes to clinic:  Unable to refill due to no refill protocol for this medication.      Requested Prescriptions  Pending Prescriptions Disp Refills   RYBELSUS 14 MG TABS [Pharmacy Med Name: Rybelsus 14 MG Oral Tablet] 90 tablet 0    Sig: Take 1 tablet by mouth once daily     Off-Protocol Failed - 06/13/2023  4:39 PM      Failed - Medication not assigned to a protocol, review manually.      Passed - Valid encounter within last 12 months    Recent Outpatient Visits           1 month ago Hypertension associated with diabetes [facility])   [place] [facility] [place], Marzella Schlein, MD   4 months ago Hypertension associated with diabetes [facility])   [place] [facility] [place], Marzella Schlein, MD   7 months ago Hypertension associated with diabetes [facility])   [place] [facility] [place], Marzella Schlein, MD   7 months ago Encounter for [employer] annual wellness exam   [place] [facility] [place], Marzella Schlein, MD   10 months ago Type 2 diabetes mellitus with hyperglycemia, without long-term current use of insulin [facility])   [place] [facility] Bacigalupo, Marzella Schlein, MD       Future Appointments             In 1 month Bacigalupo, Marzella Schlein, MD [facility]

## 2023-07-13 ENCOUNTER — Other Ambulatory Visit: Payer: Self-pay | Admitting: Family Medicine

## 2023-07-13 DIAGNOSIS — Z8673 Personal history of transient ischemic attack (TIA), and cerebral infarction without residual deficits: Secondary | ICD-10-CM

## 2023-07-14 NOTE — Telephone Encounter (Signed)
 Requested medications are due for refill today.  yes  Requested medications are on the active medications list.  yes  Last refill. 03/18/2023 #90 0 rf  Future visit scheduled.   yes  Notes to clinic.  Labs are expired.    Requested Prescriptions  Pending Prescriptions Disp Refills   clopidogrel (PLAVIX) 75 MG tablet [Pharmacy Med Name: Clopidogrel Bisulfate 75 MG Oral Tablet] 90 tablet 0    Sig: Take 1 tablet by mouth once daily     Hematology: Antiplatelets - clopidogrel Failed - 07/14/2023  5:08 PM      Failed - HCT in normal range and within 180 days    HCT  Date Value Ref Range Status  07/13/2022 36 36 - 46 Final   Hematocrit  Date Value Ref Range Status  06/16/2021 36.1 34.0 - 46.6 % Final         Failed - HGB in normal range and within 180 days    Hemoglobin  Date Value Ref Range Status  07/13/2022 12.0 12.0 - 16.0 Final  06/16/2021 11.6 11.1 - 15.9 g/dL Final         Failed - PLT in normal range and within 180 days    Platelets  Date Value Ref Range Status  07/13/2022 173 150 - 400 K/uL Final  06/16/2021 264 150 - 450 x10E3/uL Final         Failed - Cr in normal range and within 360 days    Creatinine  Date Value Ref Range Status  07/13/2022 1.8 (A) 0.5 - 1.1 Final  10/10/2012 0.93 0.60 - 1.30 mg/dL Final   Creatinine, Ser  Date Value Ref Range Status  06/16/2021 2.23 (H) 0.57 - 1.00 mg/dL Final   Creatinine, Urine  Date Value Ref Range Status  07/13/2022 38.2  Final         Failed - Valid encounter within last 6 months    Recent Outpatient Visits   None     Future Appointments             In 3 weeks Bacigalupo, Marzella Schlein, MD [facility]

## 2023-07-21 ENCOUNTER — Encounter: Payer: Self-pay | Admitting: Family Medicine

## 2023-07-21 ENCOUNTER — Ambulatory Visit (INDEPENDENT_AMBULATORY_CARE_PROVIDER_SITE_OTHER): Admitting: Family Medicine

## 2023-07-21 VITALS — BP 122/58 | HR 77 | Ht 62.0 in | Wt 129.0 lb

## 2023-07-21 DIAGNOSIS — R109 Unspecified abdominal pain: Secondary | ICD-10-CM | POA: Diagnosis not present

## 2023-07-21 LAB — POCT URINALYSIS DIPSTICK
Blood, UA: NEGATIVE
Glucose, UA: POSITIVE — AB
Ketones, UA: NEGATIVE
Nitrite, UA: NEGATIVE
Protein, UA: POSITIVE — AB
Spec Grav, UA: 1.02 (ref 1.010–1.025)
Urobilinogen, UA: 0.2 U/dL
pH, UA: 5 (ref 5.0–8.0)

## 2023-07-21 MED ORDER — CEPHALEXIN 500 MG PO CAPS: 500.0000 mg | ORAL_CAPSULE | Freq: Three times a day (TID) | ORAL | 0 refills | Status: AC

## 2023-07-21 NOTE — Patient Instructions (Signed)
 VISIT SUMMARY:  You came in today because of right-sided pain that started on Friday and did not go away as it usually does. You also mentioned having a mild fever.   YOUR PLAN:  -URINARY TRACT INFECTION (UTI): A urinary tract infection is an infection in any part of your urinary system. Your symptoms and urinalysis suggest a UTI. You will take Keflex 500 mg three times daily, and we have ordered a urine culture to make sure the antibiotic is right for you.   INSTRUCTIONS:  Please follow up with your primary care provider on August 04, 2023.   Continue taking your prescribed medications and monitor your symptoms. If you experience any worsening symptoms or new issues, contact your healthcare provider immediately.

## 2023-07-21 NOTE — Progress Notes (Unsigned)
 ACUTE PATIENT VISIT    Patient: Kelli Gutierrez   DOB: 1950/05/12   73 y.o. Female  MRN: [id number] Visit Date: 07/21/2023  Today's healthcare provider: Ronnald Ramp, MD   PCP: Erasmo Downer, MD   Chief Complaint  Patient presents with   Abdominal Pain    R lower side pain for about a week, its painful when she moves     Subjective     HPI     Abdominal Pain    Additional comments: R lower side pain for about a week, its painful when she moves       Last edited by Thedora Hinders, CMA on 07/21/2023 10:33 AM.       Discussed the use of AI scribe software for clinical note transcription with the patient, who gave verbal consent to proceed.  History of Present Illness          Discussed the use of AI scribe software for clinical note transcription with the patient, who gave verbal consent to proceed.  History of Present Illness Kelli Gutierrez is a 73 year old female with chronic kidney disease who presents with right-sided pain.  She has been experiencing right-sided pain since Friday. The pain has occurred before but typically resolves within a day. This time, the pain persisted, prompting her visit. She describes the pain as originating from the back and radiating downwards, exacerbated by movement or reaching.  No urinary symptoms such as dysuria or hematuria. She recalls experiencing a mild fever on Saturday, although she did not measure it with a thermometer. She also reports feeling chills and being cold, which she attributes to her anemia.  She notes the presence of a sore spot near her nose and mouth, describing it as a blister or pimple-like lesion that is painful to touch.  She has rheumatoid arthritis, which she differentiates from her current pain, noting that the arthritis pain is different in nature.  She has a history of chronic kidney disease and follows up with a nephrologist regularly. She is allergic to hydrocodone.    Past  Medical History:  Diagnosis Date   Acute CVA (cerebrovascular accident) (HCC) 12/17/2014   Anemia    Breast pain, right 06/01/2015   Chronic kidney disease 12/2015   CVA (cerebral infarction) 12/17/2014   Diabetes mellitus without complication (HCC)    GERD (gastroesophageal reflux disease)    Hyperlipidemia    Hypertension    Lumbar radiculitis 12/06/2013   Meningitis    Rheumatoid arthritis (HCC)    Stroke (HCC)     Medications: Outpatient Medications Prior to Visit  Medication Sig   amLODipine (NORVASC) 5 MG tablet Take 1 tablet by mouth daily.   aspirin 81 MG chewable tablet Chew 81 mg by mouth daily.   atorvastatin (LIPITOR) 20 MG tablet Take 1 tablet by mouth once daily   blood glucose meter kit and supplies Accu-Chek Guide Me meter. To check blood sugar once daily. Dx: E11.65   clopidogrel (PLAVIX) 75 MG tablet Take 1 tablet by mouth once daily   glipiZIDE (GLUCOTROL) 5 MG tablet Take 1 tablet (5 mg total) by mouth 2 (two) times daily before a meal.   hydroxychloroquine (PLAQUENIL) 200 MG tablet Take 200 mg by mouth daily.   JARDIANCE 25 MG TABS tablet TAKE 1 TABLET BY MOUTH ONCE DAILY BEFORE BREAKFAST   metFORMIN (GLUCOPHAGE-XR) 500 MG 24 hr tablet Take 500 mg by mouth daily with breakfast.   metoprolol succinate (TOPROL-XL)  25 MG 24 hr tablet Take 1 tablet by mouth once daily   pregabalin (LYRICA) 25 MG capsule Take 1 capsule (25 mg total) by mouth 2 (two) times daily.   Semaglutide (RYBELSUS) 14 MG TABS Take 1 tablet by mouth once daily   Lancet Devices MISC Use to test blood glucose once daily. Dx: E11.65 (Patient not taking: Reported on 07/21/2023)   lidocaine (LIDODERM) 5 % Place 1 patch onto the skin every 12 (twelve) hours. Remove & Discard patch within 12 hours or as directed by MD (Patient not taking: Reported on 07/21/2023)   No facility-administered medications prior to visit.    Review of Systems  Last metabolic panel Lab Results  Component Value Date   GLUCOSE  150 (H) 06/16/2021   NA 137 07/13/2022   K 4.7 07/13/2022   CL 109 (A) 07/13/2022   CO2 24 (A) 07/13/2022   BUN 38 (A) 07/13/2022   CREATININE 1.8 (A) 07/13/2022   EGFR 30 07/13/2022   CALCIUM 9.2 07/13/2022   PROT 8.0 06/16/2021   ALBUMIN 4.3 06/16/2021   LABGLOB 3.7 06/16/2021   AGRATIO 1.2 06/16/2021   BILITOT 0.8 06/16/2021   ALKPHOS 129 (H) 06/16/2021   AST 28 06/16/2021   ALT 22 06/16/2021   ANIONGAP 8 02/03/2019   Last thyroid functions Lab Results  Component Value Date   TSH 1.810 03/13/2020        Objective    BP (!) 122/58   Pulse 77   Ht 5\' 2"  (1.575 m)   Wt 129 lb (58.5 kg)   SpO2 100%   BMI 23.59 kg/m  BP Readings from Last 3 Encounters:  07/21/23 (!) 122/58  04/28/23 134/60  02/05/23 (!) 149/66   Wt Readings from Last 3 Encounters:  07/21/23 129 lb (58.5 kg)  04/28/23 130 lb 1.6 oz (59 kg)  02/05/23 132 lb (59.9 kg)        Physical Exam  ABD: soft, tenderness on right flank, no distention noted, no CVA tenderness    No results found for any visits on 07/21/23.  Assessment & Plan     Problem List Items Addressed This Visit   None Visit Diagnoses       Flank pain, acute    -  Primary   Relevant Medications   cephALEXin (KEFLEX) 500 MG capsule   Other Relevant Orders   Urine Culture   POCT Urinalysis Dipstick        Assessment & Plan Urinary Tract Infection (UTI) Presents with right-sided back pain, fever, and chills, consistent with a UTI. Urinalysis indicates a UTI. Absence of CVA tenderness suggests against pyelonephritis. Reports improvement today. - Prescribe Keflex 500 mg three times daily. - Order urine culture to reassess antibiotic regimen.  Follow-up Scheduled follow-up on August 04, 2023. - Follow up with primary care provider on August 04, 2023.       No follow-ups on file.         Ronnald Ramp, MD  [facility] [phone] (phone) [phone] (fax)  [facility]

## 2023-07-23 LAB — URINE CULTURE

## 2023-07-29 ENCOUNTER — Other Ambulatory Visit: Payer: Self-pay | Admitting: Family Medicine

## 2023-07-29 NOTE — Telephone Encounter (Signed)
 Requested Prescriptions  Pending Prescriptions Disp Refills   metoprolol  succinate (TOPROL -XL) 25 MG 24 hr tablet [Pharmacy Med Name: Metoprolol  Succinate ER 25 MG Oral Tablet Extended Release 24 Hour] 90 tablet 0    Sig: Take 1 tablet by mouth once daily     Cardiovascular:  Beta Blockers Failed - 07/29/2023  2:28 PM      Failed - Valid encounter within last 6 months    Recent Outpatient Visits           1 week ago Flank pain, acute    [facility] [place], [place], MD       Future Appointments             In 6 days Bacigalupo, Jon HERO, MD [facility]            Passed - Last BP in normal range    BP Readings from Last 1 Encounters:  07/21/23 (!) 122/58         Passed - Last Heart Rate in normal range    Pulse Readings from Last 1 Encounters:  07/21/23 77

## 2023-08-04 ENCOUNTER — Ambulatory Visit: Payer: Self-pay | Admitting: Family Medicine

## 2023-08-08 ENCOUNTER — Encounter: Payer: Self-pay | Admitting: Family Medicine

## 2023-08-08 ENCOUNTER — Ambulatory Visit (INDEPENDENT_AMBULATORY_CARE_PROVIDER_SITE_OTHER): Admitting: Family Medicine

## 2023-08-08 ENCOUNTER — Other Ambulatory Visit: Payer: Self-pay

## 2023-08-08 VITALS — BP 131/57 | HR 79 | Ht 62.0 in | Wt 132.5 lb

## 2023-08-08 DIAGNOSIS — I69359 Hemiplegia and hemiparesis following cerebral infarction affecting unspecified side: Secondary | ICD-10-CM

## 2023-08-08 DIAGNOSIS — E1159 Type 2 diabetes mellitus with other circulatory complications: Secondary | ICD-10-CM | POA: Diagnosis not present

## 2023-08-08 DIAGNOSIS — E785 Hyperlipidemia, unspecified: Secondary | ICD-10-CM

## 2023-08-08 DIAGNOSIS — N184 Chronic kidney disease, stage 4 (severe): Secondary | ICD-10-CM

## 2023-08-08 DIAGNOSIS — E1165 Type 2 diabetes mellitus with hyperglycemia: Secondary | ICD-10-CM

## 2023-08-08 DIAGNOSIS — E1142 Type 2 diabetes mellitus with diabetic polyneuropathy: Secondary | ICD-10-CM

## 2023-08-08 DIAGNOSIS — E1169 Type 2 diabetes mellitus with other specified complication: Secondary | ICD-10-CM

## 2023-08-08 DIAGNOSIS — I69328 Other speech and language deficits following cerebral infarction: Secondary | ICD-10-CM

## 2023-08-08 DIAGNOSIS — I152 Hypertension secondary to endocrine disorders: Secondary | ICD-10-CM

## 2023-08-08 DIAGNOSIS — Z7984 Long term (current) use of oral hypoglycemic drugs: Secondary | ICD-10-CM

## 2023-08-08 MED ORDER — METOPROLOL SUCCINATE ER 25 MG PO TB24
25.0000 mg | ORAL_TABLET | Freq: Every day | ORAL | 3 refills | Status: DC
  Filled 2023-08-08 – 2023-10-06 (×4): qty 90, 90d supply, fill #0

## 2023-08-08 MED ORDER — ATORVASTATIN CALCIUM 20 MG PO TABS
20.0000 mg | ORAL_TABLET | Freq: Every day | ORAL | 3 refills | Status: AC
  Filled 2023-08-08: qty 90, 90d supply, fill #0
  Filled ????-??-??: fill #0

## 2023-08-08 MED ORDER — ATORVASTATIN CALCIUM 20 MG PO TABS
20.0000 mg | ORAL_TABLET | Freq: Every day | ORAL | 3 refills | Status: DC
  Filled 2023-08-08: qty 90, 90d supply, fill #0
  Filled 2023-08-23: qty 30, 30d supply, fill #0
  Filled 2023-09-13 – 2023-09-23 (×2): qty 30, 30d supply, fill #1

## 2023-08-08 MED ORDER — AMLODIPINE BESYLATE 5 MG PO TABS
5.0000 mg | ORAL_TABLET | Freq: Every day | ORAL | 3 refills | Status: AC
  Filled 2023-08-08 – 2023-09-23 (×3): qty 90, 90d supply, fill #0

## 2023-08-08 MED ORDER — ACCU-CHEK SOFTCLIX LANCETS MISC: 3 refills | Status: DC

## 2023-08-08 MED ORDER — LISINOPRIL 10 MG PO TABS
10.0000 mg | ORAL_TABLET | Freq: Every day | ORAL | 11 refills | Status: DC
  Filled 2023-08-08 – 2023-08-19 (×2): qty 30, 30d supply, fill #0
  Filled 2023-09-23: qty 30, 30d supply, fill #1

## 2023-08-08 MED ORDER — HYDROXYCHLOROQUINE SULFATE 200 MG PO TABS
200.0000 mg | ORAL_TABLET | Freq: Every day | ORAL | 1 refills | Status: AC
  Filled 2023-08-08: qty 90, 90d supply, fill #0

## 2023-08-08 NOTE — Assessment & Plan Note (Signed)
 Post-stroke symptoms include fatigue, heaviness, and muscle tightness, affecting daily activities. Declines physical therapy due to personal constraints and lack of perceived benefit. - Consider home health physical therapy if circumstances change.

## 2023-08-08 NOTE — Assessment & Plan Note (Signed)
 Type 2 diabetes is improving with a recent A1c reduction from 7.8 to 7.1. Diabetic neuropathy symptoms persist, contributing to fatigue and heaviness, especially after stroke. - Continue current diabetes management regimen.

## 2023-08-08 NOTE — Assessment & Plan Note (Addendum)
 Reports issues with obtaining atorvastatin  from the pharmacy, leading to a two-week lapse in medication, potentially affecting lipid control. - Transfer atorvastatin  prescription to the hospital pharmacy to avoid issues with [facility] pharmacy.

## 2023-08-08 NOTE — Assessment & Plan Note (Signed)
 Hypertension is well-controlled with current medication regimen.

## 2023-08-08 NOTE — Assessment & Plan Note (Signed)
 Continue lyrica  at current dose

## 2023-08-08 NOTE — Progress Notes (Signed)
 Established patient visit   Patient: Kelli Gutierrez   DOB: 1951/03/11   73 y.o. Female  MRN: [id number] Visit Date: 08/08/2023  Today's healthcare provider: Aden Agreste, MD   Chief Complaint  Patient presents with   Medical Management of Chronic Issues   Hyperlipidemia   Hypertension   Diabetes    A1c last checked 06/20/23 - 7.1% Reports having nausea everyday Ophthalmology Exam - previously seen at [facility] and would like to return   Subjective    Hyperlipidemia  Hypertension  Diabetes   HPI     Diabetes    Additional comments: A1c last checked 06/20/23 - 7.1% Reports having nausea everyday Ophthalmology Exam - previously seen at [facility] and would like to return      Last edited by Kelli Gutierrez, CMA on 08/08/2023  9:37 AM.       Discussed the use of AI scribe software for clinical note transcription with the patient, who gave verbal consent to proceed.  History of Present Illness   A 73 year old patient with a history of type two diabetes, hyperlipidemia, diabetic peripheral neuropathy, hypertension, chronic kidney disease stage four with anemia, and a history of stroke presents for a routine follow-up. The patient reports a significant increase in fatigue and difficulty walking even short distances. She describes a feeling of heaviness and muscle tightness, particularly on the side affected by the stroke. The patient states that these symptoms have been ongoing since the stroke and have been gradually worsening. She denies experiencing chest pain, dizziness, or changes in vision. The patient also mentions a recent issue with her pharmacy not refilling her cholesterol medication, resulting in her being without it for approximately two weeks.         Medications: Outpatient Medications Prior to Visit  Medication Sig   amLODipine  (NORVASC ) 5 MG tablet Take 1 tablet by mouth daily.   aspirin  81 MG chewable tablet Chew 81 mg by mouth daily.    clopidogrel  (PLAVIX ) 75 MG tablet Take 1 tablet by mouth once daily   glipiZIDE  (GLUCOTROL ) 5 MG tablet Take 1 tablet (5 mg total) by mouth 2 (two) times daily before a meal.   hydroxychloroquine (PLAQUENIL) 200 MG tablet Take 200 mg by mouth daily.   JARDIANCE  25 MG TABS tablet TAKE 1 TABLET BY MOUTH ONCE DAILY BEFORE BREAKFAST   metFORMIN  (GLUCOPHAGE -XR) 500 MG 24 hr tablet Take 500 mg by mouth daily with breakfast.   pregabalin  (LYRICA ) 25 MG capsule Take 1 capsule (25 mg total) by mouth 2 (two) times daily.   Semaglutide  (RYBELSUS ) 14 MG TABS Take 1 tablet by mouth once daily   [DISCONTINUED] atorvastatin  (LIPITOR) 20 MG tablet Take 1 tablet by mouth once daily   [DISCONTINUED] blood glucose meter kit and supplies Accu-Chek Guide Me meter. To check blood sugar once daily. Dx: E11.65 (Patient not taking: Reported on 08/08/2023)   [DISCONTINUED] Lanc[employer] Devices MISC Use to test blood glucose once daily. Dx: E11.65 (Patient not taking: Reported on 08/08/2023)   [DISCONTINUED] lidocaine  (LIDODERM ) 5 % Place 1 patch onto the skin every 12 (twelve) hours. Remove & Discard patch within 12 hours or as directed by MD (Patient not taking: Reported on 08/08/2023)   [DISCONTINUED] metoprolol  succinate (TOPROL -XL) 25 MG 24 hr tablet Take 1 tablet by mouth once daily (Patient not taking: Reported on 08/08/2023)   No facility-administered medications prior to visit.    Review of Systems     Objective    BP Kelli [facility]rez)  131/57 (BP Location: Left Arm, Patient Position: Sitting, Cuff Size: Normal)   Pulse 79   Ht 5\' 2"  (1.575 m)   Wt 132 lb 8 oz (60.1 kg)   SpO2 100%   BMI 24.23 kg/m    Physical Exam Vitals reviewed.  Constitutional:      General: She is not in acute distress.    Appearance: Normal appearance. She is well-developed. She is not diaphoretic.  HENT:     Head: Normocephalic and atraumatic.  Eyes:     General: No scleral icterus.    Conjunctiva/sclera: Conjunctivae normal.  Neck:      Thyroid: No thyromegaly.  Cardiovascular:     Rate and Rhythm: Normal rate and regular rhythm.     Heart sounds: Normal heart sounds. No murmur heard. Pulmonary:     Effort: Pulmonary effort is normal. No respiratory distress.     Breath sounds: Normal breath sounds. No wheezing, rhonchi or rales.  Musculoskeletal:     Cervical back: Neck supple.     Right lower leg: No edema.     Left lower leg: No edema.  Lymphadenopathy:     Cervical: No cervical adenopathy.  Skin:    General: Skin is warm and dry.     Findings: No rash.  Neurological:     Mental Status: She is alert and oriented to person, place, and time. Mental status is at baseline.  Psychiatric:        Mood and Affect: Mood normal.        Behavior: Behavior normal.      No results found for any visits on 08/08/23.  Assessment & Plan     Problem List Items Addressed This Visit       Cardiovascular and Mediastinum   Hypertension associated with diabetes (HCC) - Primary   Hypertension is well-controlled with current medication regimen.      Relevant Medications   atorvastatin  (LIPITOR) 20 MG tablet   metoprolol  succinate (TOPROL -XL) 25 MG 24 hr tablet     Endocrine   Type 2 diabetes mellitus with hyperglycemia, without long-term current use of insulin  (HCC)   Type 2 diabetes is improving with a recent A1c reduction from 7.8 to 7.1. Diabetic neuropathy symptoms persist, contributing to fatigue and heaviness, especially after stroke. - Continue current diabetes management regimen.      Relevant Medications   atorvastatin  (LIPITOR) 20 MG tablet   Hyperlipidemia associated with type 2 diabetes mellitus (HCC)   Reports issues with obtaining atorvastatin  from the pharmacy, leading to a two-week lapse in medication, potentially affecting lipid control. - Transfer atorvastatin  prescription to the hospital pharmacy to avoid issues with [facility].       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 Declines physical therapy due to personal constraints and lack of perceived benefit. - Consider home health physical therapy if circumstances change.        Genitourinary   CKD (chronic kidney disease) stage 4, GFR 15-29 ml/min (HCC)   Chronic kidney disease is managed by nephrology with stable kidney function and improved  anemia.      Other Visit Diagnoses       Hyperlipidemia, unspecified hyperlipidemia type       Relevant Medications   atorvastatin  (LIPITOR) 20 MG tablet   metoprolol  succinate (TOPROL -XL) 25 MG 24 hr tablet        Return in about 6 months (around 02/07/2024) for chronic disease f/u, AWV, With PCP.      Total time spent on today's visit was greater than 40 minutes, including both face-to-face time and nonface-to-face time personally spent on review of chart (labs and imaging), discussing labs and goals, discussing further work-up, treatment options, reviewing outside records, answering patient's questions, and coordinating care with pharmacy.   Kelli Agreste, MD  [facility] [phone] (phone) [phone] (fax)  [facility]

## 2023-08-08 NOTE — Assessment & Plan Note (Signed)
 Chronic kidney disease is managed by nephrology with stable kidney function and improved anemia.

## 2023-08-11 ENCOUNTER — Other Ambulatory Visit: Payer: Self-pay

## 2023-08-19 ENCOUNTER — Other Ambulatory Visit: Payer: Self-pay

## 2023-08-19 ENCOUNTER — Other Ambulatory Visit: Payer: Self-pay | Admitting: Family Medicine

## 2023-08-19 MED ORDER — GLIPIZIDE 5 MG PO TABS
5.0000 mg | ORAL_TABLET | Freq: Two times a day (BID) | ORAL | 1 refills | Status: DC
  Filled 2023-08-19: qty 180, 90d supply, fill #0

## 2023-08-23 ENCOUNTER — Other Ambulatory Visit: Payer: Self-pay

## 2023-08-30 ENCOUNTER — Other Ambulatory Visit: Payer: Self-pay

## 2023-08-31 ENCOUNTER — Other Ambulatory Visit: Payer: Self-pay | Admitting: Family Medicine

## 2023-08-31 ENCOUNTER — Other Ambulatory Visit: Payer: Self-pay

## 2023-08-31 DIAGNOSIS — Z8673 Personal history of transient ischemic attack (TIA), and cerebral infarction without residual deficits: Secondary | ICD-10-CM

## 2023-08-31 MED ORDER — RYBELSUS 14 MG PO TABS
1.0000 | ORAL_TABLET | Freq: Every day | ORAL | 1 refills | Status: DC
  Filled 2023-08-31: qty 90, 90d supply, fill #0

## 2023-08-31 MED ORDER — CLOPIDOGREL BISULFATE 75 MG PO TABS
75.0000 mg | ORAL_TABLET | Freq: Every day | ORAL | 0 refills | Status: DC
  Filled 2023-08-31 – 2023-10-06 (×2): qty 90, 90d supply, fill #0

## 2023-08-31 MED ORDER — EMPAGLIFLOZIN 25 MG PO TABS
25.0000 mg | ORAL_TABLET | Freq: Every day | ORAL | 0 refills | Status: DC
  Filled 2023-08-31: qty 90, 90d supply, fill #0

## 2023-08-31 MED ORDER — METFORMIN HCL ER 500 MG PO TB24
500.0000 mg | ORAL_TABLET | Freq: Every day | ORAL | 0 refills | Status: DC
  Filled 2023-08-31 – 2023-10-06 (×2): qty 90, 90d supply, fill #0

## 2023-08-31 NOTE — Telephone Encounter (Signed)
 Copied from CRM [id number]. Topic: Clinical - Medication Refill >> Aug 31, 2023 10:12 AM Tiffany S wrote: Medication: JARDIANCE  25 MG TABS tablet [[id number]]  Ord metFORMIN  (GLUCOPHAGE -XR) 500 MG 24 hr tablet [[id number]] Semaglutide  (RYBELSUS ) 14 MG TABS [[id number]] clopidogrel  (PLAVIX ) 75 MG tablet [[id number]]  Has the patient contacted their pharmacy? Yes (Agent: If no, request that the patient contact the pharmacy for the refill. If patient does not wish to contact the pharmacy document the reason why and proceed with request.) (Agent: If yes, when and what did the pharmacy advise?)  This is the patient's preferred pharmacy:    [facility] [address] [place] [address] Phone: [phone] Fax: [phone]  Is this the correct pharmacy for this prescription? Yes If no, delete pharmacy and type the correct one.   Has the prescription been filled recently? Yes  Is the patient out of the medication? Yes  Has the patient been seen for an appointment in the last year OR does the patient have an upcoming appointment? Yes  Can we respond through MyChart? Yes  Agent: Please be advised that Rx refills may take up to 3 business days. We ask that you follow-up with your pharmacy.

## 2023-09-02 ENCOUNTER — Other Ambulatory Visit: Payer: Self-pay

## 2023-09-06 ENCOUNTER — Other Ambulatory Visit: Payer: Self-pay

## 2023-09-12 ENCOUNTER — Other Ambulatory Visit: Payer: Self-pay

## 2023-09-13 ENCOUNTER — Other Ambulatory Visit: Payer: Self-pay

## 2023-09-22 ENCOUNTER — Other Ambulatory Visit: Payer: Self-pay

## 2023-09-23 ENCOUNTER — Other Ambulatory Visit: Payer: Self-pay

## 2023-10-06 ENCOUNTER — Other Ambulatory Visit: Payer: Self-pay

## 2023-10-11 ENCOUNTER — Other Ambulatory Visit: Payer: Self-pay | Admitting: Family Medicine

## 2023-10-11 ENCOUNTER — Other Ambulatory Visit: Payer: Self-pay

## 2023-10-11 DIAGNOSIS — Z8673 Personal history of transient ischemic attack (TIA), and cerebral infarction without residual deficits: Secondary | ICD-10-CM

## 2023-10-11 MED ORDER — SEVELAMER CARBONATE 800 MG PO TABS
800.0000 mg | ORAL_TABLET | Freq: Two times a day (BID) | ORAL | 3 refills | Status: AC
  Filled 2023-10-11: qty 180, 90d supply, fill #0

## 2023-10-11 NOTE — Telephone Encounter (Unsigned)
 Copied from CRM [id number]. Topic: Clinical - Medication Refill >> Oct 11, 2023 11:41 AM Winona R wrote: Medication:  clopidogrel  (PLAVIX ) 75 MG tablet    Has the patient contacted their pharmacy? Yes Pharmacy stated there are no refills  This is the patient's preferred pharmacy:   [facility] [address] [place] [address] Phone: [phone] Fax: [phone]  Is this the correct pharmacy for this prescription? Yes If no, delete pharmacy and type the correct one.   Has the prescription been filled recently? Yes, May  Is the patient out of the medication? Yes  Has the patient been seen for an appointment in the last year OR does the patient have an upcoming appointment? No  Can we respond through MyChart? {yes/no:[id number]}  Agent: Please be advised that Rx refills may take up to 3 business days. We ask that you follow-up with your pharmacy.   -----------------------------------------------------------------------  Pt states she out of her clopidogrel  (PLAVIX ) 75 MG tablet however it seems she's suppose to have a 90 day supply as of 08/31/2023,  Pt also could not verify if this was the correct medication she just stated its her blood  thinner that is 75 mg.

## 2023-10-11 NOTE — Telephone Encounter (Signed)
 Requested medications are due for refill today.  Pt has medication ready for p/u at [facility] out pt pharmacy. Per note pt wants rx to go to [facility].  Requested medications are on the active medications list.  yes  Last refill. 09/10/2023 - #90 0 rf  - not picked up  Future visit scheduled.   yes  Notes to clinic.  Labs are expired. Unable to refill to new pharmacy per protocol.    Requested Prescriptions  Pending Prescriptions Disp Refills   clopidogrel  (PLAVIX ) 75 MG tablet 90 tablet 0    Sig: Take 1 tablet (75 mg total) by mouth daily.     Hematology: Antiplatelets - clopidogrel  Failed - 10/11/2023 12:00 PM      Failed - HCT in normal range and within 180 days    HCT  Date Value Ref Range Status  07/13/2022 36 36 - 46 Final   Hematocrit  Date Value Ref Range Status  06/16/2021 36.1 34.0 - 46.6 % Final         Failed - HGB in normal range and within 180 days    Hemoglobin  Date Value Ref Range Status  07/13/2022 12.0 12.0 - 16.0 Final  06/16/2021 11.6 11.1 - 15.9 g/dL Final         Failed - PLT in normal range and within 180 days    Platelets  Date Value Ref Range Status  07/13/2022 173 150 - 400 K/uL Final  06/16/2021 264 150 - 450 x10E3/uL Final         Failed - Cr in normal range and within 360 days    Creatinine  Date Value Ref Range Status  07/13/2022 1.8 (A) 0.5 - 1.1 Final  10/10/2012 0.93 0.60 - 1.30 mg/dL Final   Creatinine, Ser  Date Value Ref Range Status  06/16/2021 2.23 (H) 0.57 - 1.00 mg/dL Final   Creatinine, Urine  Date Value Ref Range Status  07/13/2022 38.2  Final         Failed - Valid encounter within last 6 months    Recent Outpatient Visits           2 months ago Hypertension associated with diabetes [facility])   [place] [facility] [place], Jon HERO, MD   2 months ago Flank pain, acute   [place] [facility] Sharma Coyer, MD

## 2023-10-11 NOTE — Telephone Encounter (Signed)
 Per Agent pt wants refill from [facility] they will inform pt that rx is ready for P/U.  No further action needed by clinic.

## 2023-10-11 NOTE — Telephone Encounter (Signed)
 Copied from CRM [id number]. Topic: Clinical - Medication Refill >> Oct 11, 2023 11:41 AM Winona R wrote: Medication:  clopidogrel  (PLAVIX ) 75 MG tablet    Has the patient contacted their pharmacy? Yes Pharmacy stated there are no refills  This is the patient's preferred pharmacy:   [facility] - [facility] [address] [place] [address] Phone: [phone] Fax: [phone]  Is this the correct pharmacy for this prescription? Yes If no, delete pharmacy and type the correct one.   Has the prescription been filled recently? Yes, May  Is the patient out of the medication? Yes  Has the patient been seen for an appointment in the last year OR does the patient have an upcoming appointment? No  Can we respond through MyChart? No  Agent: Please be advised that Rx refills may take up to 3 business days. We ask that you follow-up with your pharmacy.   -----------------------------------------------------------------------  Pt states she out of her clopidogrel  (PLAVIX ) 75 MG tablet however it seems she's suppose to have a 90 day supply as of 08/31/2023,  Pt also could not verify if this was the correct medication she just stated its her blood  thinner that is 75 mg.

## 2023-10-11 NOTE — Telephone Encounter (Signed)
 Called pt to see if they would like to get medication at the [facility] pharmacy or whether she wants the rx sent to [facility] - call went to VM. Unable to LM.  Called pharmacy - pt has medication at [facility] pharmacy ready for p/u.

## 2023-10-12 ENCOUNTER — Other Ambulatory Visit: Payer: Self-pay | Admitting: Family Medicine

## 2023-10-12 DIAGNOSIS — Z8673 Personal history of transient ischemic attack (TIA), and cerebral infarction without residual deficits: Secondary | ICD-10-CM

## 2023-10-13 ENCOUNTER — Telehealth: Payer: Self-pay | Admitting: Family Medicine

## 2023-10-13 ENCOUNTER — Other Ambulatory Visit: Payer: Self-pay

## 2023-10-13 DIAGNOSIS — E1165 Type 2 diabetes mellitus with hyperglycemia: Secondary | ICD-10-CM

## 2023-10-13 NOTE — Telephone Encounter (Unsigned)
 Copied from CRM [id number]. Topic: Clinical - Pink Word Triage >> Oct 13, 2023  1:29 PM Berwyn MATSU wrote: Reason for Triage:  patient not able to pick up plavix  as pharmacy is stating too soon. Per patient she is out of medication and is now requesting to know if she can take 2 asprins as she is concerned about having another stroke.   May you please assist.

## 2023-10-13 NOTE — Telephone Encounter (Signed)
 Do not double aspirin . Try to call pharmacy and see if we can authorize an early fill

## 2023-10-17 MED ORDER — METFORMIN HCL ER 500 MG PO TB24: 500.0000 mg | ORAL_TABLET | Freq: Every day | ORAL | 0 refills | Status: AC

## 2023-10-17 NOTE — Telephone Encounter (Signed)
 Mailbox full. Unable to leave message. Ok to advise if call is returned

## 2023-10-17 NOTE — Telephone Encounter (Signed)
 Pharmacy advises that rx is ready and patient just needs to pick it up but it looks like she is needing a refill on metformin  500 mg and sevelamer  carbonate 800 mg.  Advised pharmacy rx for metformin  was sent on 08/31/23 to dispense 90. They report never reciving rx. Upon further review e-rx was not confirmed received by pharmacy. Advised I would resend. Verbalized understanding   PA needed for Sevelamer  Carbonate

## 2023-10-18 ENCOUNTER — Telehealth: Payer: Self-pay

## 2023-10-18 ENCOUNTER — Other Ambulatory Visit (HOSPITAL_COMMUNITY): Payer: Self-pay

## 2023-10-18 NOTE — Telephone Encounter (Signed)
 Pharmacy Patient Advocate Encounter   Received notification from Pt Calls Messages that prior authorization for Sevelamer  Carbonate 800MG  tablets is required/requested.   Insurance verification completed.   The patient is insured through [employer] .   Per test claim: PA required; PA submitted to above mentioned insurance via CoverMyMeds Key/confirmation #/EOC [facility] Status is pending

## 2023-10-18 NOTE — Telephone Encounter (Signed)
 PA request has been Submitted. New Encounter has been or will be created for follow up. For additional info see Pharmacy Prior Auth telephone encounter from 10/18/2023 .

## 2023-10-24 ENCOUNTER — Other Ambulatory Visit: Payer: Self-pay

## 2023-10-25 NOTE — Telephone Encounter (Signed)
 Pharmacy Patient Advocate Encounter  Received notification from [employer] that Prior Authorization for Sevelamer  Carbonate 800MG  tablets  has been DENIED.  Full denial letter will be uploaded to the media tab. See denial reason below.   PA #/Case ID/Reference #: [id number]   Denied under Medicare Part D. Approved under Medicare Part B bundle payment to the dialysis facility.

## 2023-10-26 NOTE — Telephone Encounter (Signed)
 She needs to have nephrology prescribe this or another phosphate binder.  They will be able to get it approved or find another on formulary.

## 2023-10-27 NOTE — Telephone Encounter (Signed)
 NA/mailbox is full. If patient returns call ok for E2C2 to give provider's message to patient.

## 2023-11-02 ENCOUNTER — Other Ambulatory Visit: Payer: Self-pay | Admitting: Family Medicine

## 2023-11-03 ENCOUNTER — Other Ambulatory Visit: Payer: Self-pay | Admitting: Family Medicine

## 2023-11-03 NOTE — Telephone Encounter (Signed)
 Contacted patient to verify what pharmacy she would like to use as it was sent in  07/2023 to [facility]

## 2023-11-04 ENCOUNTER — Other Ambulatory Visit: Payer: Self-pay

## 2023-11-04 MED ORDER — METOPROLOL SUCCINATE ER 25 MG PO TB24: 25.0000 mg | ORAL_TABLET | Freq: Every day | ORAL | 3 refills | Status: AC

## 2023-11-04 NOTE — Telephone Encounter (Signed)
 Refills sent to [facility] as requested, attempted to call pt however no answer and no VM set up

## 2023-11-04 NOTE — Telephone Encounter (Unsigned)
 Copied from CRM [id number]. Topic: Clinical - Medication Question >> Nov 04, 2023  2:22 PM Selinda RAMAN wrote: Reason for CRM: The patient called stating she no long uses the [employer] [facility] as they do not understand her. She would like the metoprolol  succinate (TOPROL -XL) 25 MG 24 hr tablet called into her other pharmacy on file the   [facility] [address], [place] - [address]  Phone: [phone] Fax: [phone]   Please assist patient further as she has been without her meds for 2 days and she does not want to uses the [facility] because there is a lack of communication with them.

## 2023-11-08 LAB — HM DIABETES EYE EXAM

## 2023-11-09 ENCOUNTER — Ambulatory Visit
Admission: RE | Admit: 2023-11-09 | Discharge: 2023-11-09 | Disposition: A | Discharge status: Home / Self Care | Source: Ambulatory Visit | Attending: Family Medicine | Admitting: Family Medicine

## 2023-11-09 DIAGNOSIS — Z1231 Encounter for screening mammogram for malignant neoplasm of breast: Secondary | ICD-10-CM | POA: Insufficient documentation

## 2023-11-11 ENCOUNTER — Ambulatory Visit: Payer: Self-pay | Admitting: Family Medicine

## 2023-11-28 DIAGNOSIS — Z1211 Encounter for screening for malignant neoplasm of colon: Secondary | ICD-10-CM | POA: Diagnosis not present

## 2023-12-06 LAB — COLOGUARD: COLOGUARD: NEGATIVE

## 2023-12-13 ENCOUNTER — Other Ambulatory Visit: Payer: Self-pay | Admitting: Family Medicine

## 2023-12-27 ENCOUNTER — Ambulatory Visit: Payer: Self-pay

## 2023-12-27 DIAGNOSIS — E1122 Type 2 diabetes mellitus with diabetic chronic kidney disease: Secondary | ICD-10-CM | POA: Diagnosis not present

## 2023-12-27 DIAGNOSIS — N1832 Chronic kidney disease, stage 3b: Secondary | ICD-10-CM | POA: Diagnosis not present

## 2023-12-27 DIAGNOSIS — I10 Essential (primary) hypertension: Secondary | ICD-10-CM | POA: Diagnosis not present

## 2023-12-27 NOTE — Telephone Encounter (Signed)
 FYI Only or Action Required?: Action required by provider: Needs call back asap with further recommendations and/or appt options for her and son (routing his chart as well).  Patient was last seen in primary care on 08/08/2023 by Kelli Jon HERO, MD.  Called Nurse Triage reporting Otalgia, Dental Pain, Cough, and Facial Swelling.  Symptoms began several days ago.  Interventions attempted: Dietary changes.  Symptoms are: gradually worsening.  Triage Disposition: See HCP Within 4 Hours (Or PCP Triage)  Patient/caregiver understands and will follow disposition?: No, wishes to speak with PCP     Copied from CRM [id number]. Topic: Clinical - Red Word Triage >> Dec 27, 2023 10:58 AM [name] wrote: [facility] that prompted transfer to Nurse Triage: Patients ears hurt, mouth hurts when its opened and shes not eating  would like to see someone It started 2 days ago   Reason for Disposition  [1] SEVERE pain (e.g., excruciating) AND [2] not improved after 2 hours of pain medicine  Answer Assessment - Initial Assessment Questions Advised pt be examined asap for symptoms, no PCP or region availability today, advised pt go to ED for any worsening/new symptoms, advised UC in meantime, pt states she will go to ED tomorrow if worse. Please call pt back if able to fit her and pt son special needs for appt since he is having similar symptoms.     1. ONSET: When did the pain start? (e.g., minutes, hours, days)     2 days ago 2. PATTERN: Does the pain come and go, or has it been constant since it started? (e.g., constant, intermittent, fleeting)     Pain comes and goes Ear pain is off and on as well Not painful to touch 3. SEVERITY: How bad is the pain? (Scale 1-10; mild, moderate or severe)     8-9/10 when open mouth 4. LOCATION: Where does it hurt?      Only left ear hurt and mouth cannot open when open it hurts, can't open to eat food Jaw pain and connects with ear 5. RASH: Is  there any redness, rash, or swelling of your face?     Little bit swollen, not like a knot, not lot of swelling No redness or swelling to ear Swelling on neck where connect with ear 6. FEVER: Do you have a fever? If Yes, ask: What is it, how was it measured, and when did it start?      no 7. OTHER SYMPTOMS: Do you have any other symptoms? (e.g., fever, toothache, nasal discharge, nasal congestion, clicking sensation in jaw joint)     Able to drink fluids Don't eat anything like jello or smoothie No dizziness Always feel tired No injury no tooth pain Mouth cannot open it hurts If have to chew Rice soup been eating, been eating enough First time cough hurts ear ring then hurt then mouth hurts can't open to put food in Ear hurt first then next mouth cannot open If put qtip in ear it hurts No redness  Son had symptom like that also Cough He has same symptom as I have  Can't go to ED, can't wait that long  Protocols used: Face Pain-A-AH

## 2023-12-28 NOTE — Telephone Encounter (Signed)
 Left message for patient to call back for an appt with one of the providers in the office for 12/29/23.

## 2023-12-29 ENCOUNTER — Ambulatory Visit: Admitting: Physician Assistant

## 2023-12-29 ENCOUNTER — Encounter: Payer: Self-pay | Admitting: Physician Assistant

## 2023-12-29 VITALS — BP 121/56 | HR 73 | Temp 97.8°F | Ht 62.0 in | Wt 134.9 lb

## 2023-12-29 DIAGNOSIS — J069 Acute upper respiratory infection, unspecified: Secondary | ICD-10-CM

## 2023-12-29 DIAGNOSIS — H9209 Otalgia, unspecified ear: Secondary | ICD-10-CM

## 2023-12-29 LAB — POC COVID19 BINAXNOW: SARS Coronavirus 2 Ag: NEGATIVE

## 2023-12-29 LAB — POCT INFLUENZA A/B
Influenza A, POC: NEGATIVE
Influenza B, POC: NEGATIVE

## 2023-12-29 NOTE — Progress Notes (Signed)
 Established patient visit  Patient: Kelli Gutierrez   DOB: 02-Nov-1950   73 y.o. Female  MRN: [id number] Visit Date: 12/29/2023  Today's healthcare provider: Jolynn Spencer, PA-C   Chief Complaint  Patient presents with   Acute Visit    Patient is present due to productive cough with yellowish green phelegm associated with left ear pain X 4 days. She reports she is taking tylenol . She also reports having some dizziness   Cough   Subjective     HPI     Acute Visit    Additional comments: Patient is present due to productive cough with yellowish green phelegm associated with left ear pain X 4 days. She reports she is taking tylenol . She also reports having some dizziness      Last edited by Lilian Fitzpatrick, CMA on 12/29/2023 10:44 AM.       Discussed the use of AI scribe software for clinical note transcription with the patient, who gave verbal consent to proceed.  History of Present Illness Kelli Gutierrez is a 73 year old female who presents with symptoms consistent with a viral infection.  She experiences a sore throat and general malaise. Her son who lives with her was diagnosed with covid       08/08/2023    9:35 AM 04/28/2023   10:44 AM 01/27/2023    9:26 AM  Depression screen PHQ 2/9  Decreased Interest 2 1 2   Down, Depressed, Hopeless 2 1 2   PHQ - 2 Score 4 2 4   Altered sleeping 2 3 1   Tired, decreased energy 3 2 3   Change in appetite 1 2 1   Feeling bad or failure about yourself  2 1 2   Trouble concentrating 2 1 1   Moving slowly or fidgety/restless 1 1 2   Suicidal thoughts 0 0 0  PHQ-9 Score 15 12 14   Difficult doing work/chores Very difficult Somewhat difficult Very difficult      08/08/2023    9:36 AM 04/28/2023   10:45 AM  GAD 7 : Generalized Anxiety Score  Nervous, Anxious, on Edge 2 1  Control/stop worrying 2 2  Worry too much - different things 2 1  Trouble relaxing 1 1  Restless 1 1  Easily annoyed or irritable 0 0  Afraid - awful might happen 2 2   Total GAD 7 Score 10 8  Anxiety Difficulty Somewhat difficult Somewhat difficult    Medications: Outpatient Medications Prior to Visit  Medication Sig   Accu-Chek Softclix Lancets lancets Use as directed to check glucose once daily.   amLODipine  (NORVASC ) 5 MG tablet Take 1 tablet by mouth daily.   amLODipine  (NORVASC ) 5 MG tablet Take 1 tablet (5 mg total) by mouth daily.   aspirin  81 MG chewable tablet Chew 81 mg by mouth daily.   atorvastatin  (LIPITOR) 20 MG tablet Take 1 tablet (20 mg total) by mouth daily.   atorvastatin  (LIPITOR) 20 MG tablet Take 1 tablet (20 mg total) by mouth daily.   clopidogrel  (PLAVIX ) 75 MG tablet Take 1 tablet by mouth once daily   empagliflozin  (JARDIANCE ) 25 MG TABS tablet TAKE 1 TABLET BY MOUTH ONCE DAILY BEFORE BREAKFAST   glipiZIDE  (GLUCOTROL ) 5 MG tablet Take 1 tablet (5 mg total) by mouth 2 (two) times daily before a meal.   hydroxychloroquine  (PLAQUENIL ) 200 MG tablet Take 200 mg by mouth daily.   hydroxychloroquine  (PLAQUENIL ) 200 MG tablet Take 1 tablet (200 mg total) by mouth daily.   lisinopril  (ZESTRIL ) 10 MG tablet  Take 1 tablet (10 mg total) by mouth daily.   metFORMIN  (GLUCOPHAGE -XR) 500 MG 24 hr tablet Take 500 mg by mouth 2 (two) times daily.    metFORMIN  (GLUCOPHAGE -XR) 500 MG 24 hr tablet Take 500 mg by mouth 2 (two) times daily.   metFORMIN  (GLUCOPHAGE -XR) 500 MG 24 hr tablet Take 1 tablet (500 mg total) by mouth daily with breakfast.   metoprolol  succinate (TOPROL -XL) 25 MG 24 hr tablet Take 1 tablet (25 mg total) by mouth daily.   pregabalin  (LYRICA ) 25 MG capsule Take 1 capsule (25 mg total) by mouth 2 (two) times daily.   RYBELSUS  14 MG TABS Take 1 tablet by mouth once daily   sevelamer  carbonate (RENVELA ) 800 MG tablet Take 1 tablet (800 mg total) by mouth 2 (two) times daily.   No facility-administered medications prior to visit.    Review of Systems All negative Except see HPI       Objective    BP (!) 121/56 (BP  Location: Left Arm, Patient Position: Sitting, Cuff Size: Normal)   Pulse 73   Temp 97.8 F (36.6 C) (Oral)   Ht 5' 2 (1.575 m)   Wt 134 lb 14.4 oz (61.2 kg)   SpO2 100%   BMI 24.67 kg/m     Physical Exam Vitals reviewed.  Constitutional:      Appearance: She is normal weight.  HENT:     Head: Normocephalic and atraumatic.     Right Ear: Ear canal and external ear normal.     Left Ear: Ear canal and external ear normal.     Nose: Congestion and rhinorrhea present.     Mouth/Throat:     Pharynx: Posterior oropharyngeal erythema present.     Comments: Postnasal drainage noted Eyes:     General: No scleral icterus.       Right eye: No discharge.        Left eye: No discharge.     Extraocular Movements: Extraocular movements intact.     Pupils: Pupils are equal, round, and reactive to light.  Cardiovascular:     Rate and Rhythm: Normal rate and regular rhythm.  Pulmonary:     Effort: Pulmonary effort is normal.     Breath sounds: Normal breath sounds.  Abdominal:     General: Abdomen is flat. Bowel sounds are normal.     Palpations: Abdomen is soft.  Lymphadenopathy:     Cervical: No cervical adenopathy.  Neurological:     Mental Status: She is alert.      Results for orders placed or performed in visit on 12/29/23  POC COVID-19  Result Value Ref Range   SARS Coronavirus 2 Ag Negative Negative  POCT Influenza A/B  Result Value Ref Range   Influenza A, POC Negative Negative   Influenza B, POC Negative Negative         Assessment & Plan Upper respiratory infection/otalgia Symptoms likely due to viral infection, possibly COVID-19. - Advise increased fluid intake. - Suggest warm towel application for comfort. - Prescribe acetaminophen  and ibuprofen for symptom relief. - Recommend Allegra, Claritin, or Zyrtec for allergy symptoms. - Advise using nasal spray and Flonase. - Instruct to contact if symptoms worsen. RTC if symptoms persist    Orders Placed  This Encounter  Procedures   POC COVID-19   POCT Influenza A/B    No follow-ups on file.   The patient was advised to call back or seek an in-person evaluation if the symptoms worsen or if  the condition fails to improve as anticipated.  I discussed the assessment and treatment plan with the patient. The patient was provided an opportunity to ask questions and all were answered. The patient agreed with the plan and demonstrated an understanding of the instructions.  I, Gage Treiber, PA-C have reviewed all documentation for this visit. The documentation on 12/29/2023  for the exam, diagnosis, procedures, and orders are all accurate and complete.  Jolynn Spencer, [facility], MMS [facility] [phone] (phone) [phone] (fax)  [facility]

## 2024-01-03 DIAGNOSIS — N184 Chronic kidney disease, stage 4 (severe): Secondary | ICD-10-CM | POA: Diagnosis not present

## 2024-01-03 DIAGNOSIS — I10 Essential (primary) hypertension: Secondary | ICD-10-CM | POA: Diagnosis not present

## 2024-01-03 DIAGNOSIS — E0822 Diabetes mellitus due to underlying condition with diabetic chronic kidney disease: Secondary | ICD-10-CM | POA: Diagnosis not present

## 2024-01-03 DIAGNOSIS — N2581 Secondary hyperparathyroidism of renal origin: Secondary | ICD-10-CM | POA: Diagnosis not present

## 2024-01-16 DIAGNOSIS — M0579 Rheumatoid arthritis with rheumatoid factor of multiple sites without organ or systems involvement: Secondary | ICD-10-CM | POA: Diagnosis not present

## 2024-01-16 DIAGNOSIS — Z7960 Long term (current) use of unspecified immunomodulators and immunosuppressants: Secondary | ICD-10-CM | POA: Diagnosis not present

## 2024-01-17 ENCOUNTER — Other Ambulatory Visit: Payer: Self-pay | Admitting: Family Medicine

## 2024-01-25 ENCOUNTER — Other Ambulatory Visit: Payer: Self-pay | Admitting: Family Medicine

## 2024-01-25 DIAGNOSIS — Z8673 Personal history of transient ischemic attack (TIA), and cerebral infarction without residual deficits: Secondary | ICD-10-CM

## 2024-01-27 ENCOUNTER — Other Ambulatory Visit: Payer: Self-pay | Admitting: Family Medicine

## 2024-01-27 NOTE — Telephone Encounter (Signed)
 Copied from CRM [id number]. Topic: Clinical - Medication Refill >> Jan 27, 2024  4:41 PM Hadassah PARAS wrote: Medication: glipiZIDE  (GLUCOTROL ) 5 MG tablet   Has the patient contacted their pharmacy? Yes (Agent: If no, request that the patient contact the pharmacy for the refill. If patient does not wish to contact the pharmacy document the reason why and proceed with request.) (Agent: If yes, when and what did the pharmacy advise?)  This is the patient's preferred pharmacy:  [facility] [address] [place] - [address] [place] [address] Phone: [phone] Fax: [phone]  Is this the correct pharmacy for this prescription? Yes If no, delete pharmacy and type the correct one.   Has the prescription been filled recently? Yes  Is the patient out of the medication? No  Has the patient been seen for an appointment in the last year OR does the patient have an upcoming appointment? Yes  Can we respond through MyChart? No  Agent: Please be advised that Rx refills may take up to 3 business days. We ask that you follow-up with your pharmacy.

## 2024-01-31 ENCOUNTER — Ambulatory Visit: Admitting: Family Medicine

## 2024-02-14 ENCOUNTER — Ambulatory Visit: Admitting: Family Medicine

## 2024-02-16 ENCOUNTER — Other Ambulatory Visit: Payer: Self-pay | Admitting: Family Medicine

## 2024-02-22 ENCOUNTER — Other Ambulatory Visit: Payer: Self-pay | Admitting: Family Medicine

## 2024-02-24 NOTE — Telephone Encounter (Signed)
 Requested medications are due for refill today.  yes  Requested medications are on the active medications list.  yes  Last refill. 12/13/2023 #90 0 rf  Future visit scheduled.   yes  Notes to clinic.  Medication not assigned to a protocol. Please review for refill.    Requested Prescriptions  Pending Prescriptions Disp Refills   RYBELSUS  14 MG TABS [Pharmacy Med Name: Rybelsus  14 MG Oral Tablet] 90 tablet 0    Sig: Take 1 tablet by mouth once daily     Off-Protocol Failed - 02/24/2024  1:20 PM      Failed - Medication not assigned to a protocol, review manually.      Passed - Valid encounter within last 12 months    Recent Outpatient Visits           1 month ago Upper respiratory infection, acute   [facility] [place], [place], PA-C   6 months ago Hypertension associated with diabetes [facility])   [facility] [place], Jon HERO, MD   7 months ago Flank pain, acute   [facility] [facility] Sharma Coyer, MD

## 2024-03-06 ENCOUNTER — Telehealth: Payer: Self-pay | Admitting: Family Medicine

## 2024-03-06 NOTE — Telephone Encounter (Addendum)
 [facility] faxed refill request for the following medications:  True metrix air glucose meter   [employer] true metrix test strip   Trueplus 33G lancets   BD single use swab   True metrix level 1 ctrl soln   Please advise.

## 2024-03-12 NOTE — Telephone Encounter (Signed)
 Rx not sent as none are on patient medication list

## 2024-03-13 ENCOUNTER — Encounter: Payer: Self-pay | Admitting: Family Medicine

## 2024-03-13 ENCOUNTER — Other Ambulatory Visit: Payer: Self-pay | Admitting: Family Medicine

## 2024-03-13 ENCOUNTER — Ambulatory Visit: Admitting: Family Medicine

## 2024-03-13 VITALS — BP 105/58 | HR 78 | Ht 62.0 in | Wt 131.8 lb

## 2024-03-13 DIAGNOSIS — E1165 Type 2 diabetes mellitus with hyperglycemia: Secondary | ICD-10-CM

## 2024-03-13 DIAGNOSIS — E1169 Type 2 diabetes mellitus with other specified complication: Secondary | ICD-10-CM

## 2024-03-13 DIAGNOSIS — N184 Chronic kidney disease, stage 4 (severe): Secondary | ICD-10-CM | POA: Diagnosis not present

## 2024-03-13 DIAGNOSIS — Z23 Encounter for immunization: Secondary | ICD-10-CM

## 2024-03-13 DIAGNOSIS — E1159 Type 2 diabetes mellitus with other circulatory complications: Secondary | ICD-10-CM

## 2024-03-13 DIAGNOSIS — F321 Major depressive disorder, single episode, moderate: Secondary | ICD-10-CM

## 2024-03-13 LAB — POCT GLYCOSYLATED HEMOGLOBIN (HGB A1C): Hemoglobin A1C: 7.5 % — AB (ref 4.0–5.6)

## 2024-03-13 MED ORDER — FLUTICASONE PROPIONATE 50 MCG/ACT NA SUSP: 2.0000 | Freq: Every day | NASAL | 6 refills | Status: AC

## 2024-03-13 NOTE — Assessment & Plan Note (Signed)
 Type 2 diabetes with recent A1c of 7.5, slightly increased from previous 7.1. Concerns about medication safety due to worsening kidney function. Current medications include metformin , Jardiance , and glipizide . Discussion about potential need for insulin  if kidney function remains poor. - Ordered A1c test to monitor blood sugar control. - Continue current diabetes medications until further assessment of kidney function. - Will discuss potential transition to insulin  if kidney function remains poor.

## 2024-03-13 NOTE — Assessment & Plan Note (Signed)
 Recent worsening of kidney function to approximately 20%. Concerns about medication safety due to kidney function. Insurance issues with current nephrologist accepting [employer]. - Referred to a new nephrologist who accepts [facility]. - Ordered kidney function tests to reassess current status. - Will discuss potential medication adjustments based on kidney function results.

## 2024-03-13 NOTE — Assessment & Plan Note (Signed)
Will continue to monitor lipids.
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
## 2024-03-13 NOTE — Assessment & Plan Note (Signed)
 Denies any desire for therapy

## 2024-03-13 NOTE — Assessment & Plan Note (Signed)
 Hypertension with current low blood pressure readings, possibly contributing to fatigue. Current medication includes lisinopril , which has been halved by nephrologist. Decision made to stop lisinopril  to address low blood pressure and improve energy levels. - Discontinued lisinopril  to address low blood pressure and improve energy levels.

## 2024-03-14 ENCOUNTER — Ambulatory Visit: Payer: Self-pay

## 2024-03-14 LAB — MICROALBUMIN / CREATININE URINE RATIO
Creatinine, Urine: 236.7 mg/dL
Microalb/Creat Ratio: 204 mg/g{creat} — ABNORMAL HIGH (ref 0–29)
Microalbumin, Urine: 481.7 ug/mL

## 2024-03-14 LAB — BASIC METABOLIC PANEL WITH GFR
BUN/Creatinine Ratio: 16 (ref 12–28)
BUN: 39 mg/dL — ABNORMAL HIGH (ref 8–27)
CO2: 16 mmol/L — ABNORMAL LOW (ref 20–29)
Calcium: 10 mg/dL (ref 8.7–10.3)
Chloride: 105 mmol/L (ref 96–106)
Creatinine, Ser: 2.46 mg/dL — ABNORMAL HIGH (ref 0.57–1.00)
Glucose: 132 mg/dL — ABNORMAL HIGH (ref 70–99)
Potassium: 6 mmol/L — ABNORMAL HIGH (ref 3.5–5.2)
Sodium: 136 mmol/L (ref 134–144)
eGFR: 20 mL/min/1.73 — ABNORMAL LOW (ref >59)

## 2024-03-19 ENCOUNTER — Telehealth: Payer: Self-pay | Admitting: Family Medicine

## 2024-03-19 DIAGNOSIS — E1165 Type 2 diabetes mellitus with hyperglycemia: Secondary | ICD-10-CM

## 2024-03-19 NOTE — Telephone Encounter (Signed)
 [facility] has sent in a prescription request for: True Metrix Air Glucose Meter [employer] True Metric Test Strips Trueplus 33G Lancets  Fill date: 03/05/2024 Pt has requested this to be filled through [facility] Mail [facility]

## 2024-03-19 NOTE — Progress Notes (Signed)
 Called patient to inform her of provider's message, voicemail was full ok for E2C2 to relay message if patient calls back.  Will try to call again later.

## 2024-03-19 NOTE — Telephone Encounter (Signed)
 [facility] has sent in a refill request for BD single use swab, and True Metrix lvl 1 CTRL SOLN. Fill date 03/05/2024. The request states that Kelli Gutierrez would like her prescriptions delivered through [facility] Mail [facility].

## 2024-03-20 ENCOUNTER — Other Ambulatory Visit: Payer: Self-pay

## 2024-03-20 MED ORDER — TRUE METRIX BLOOD GLUCOSE TEST VI STRP: ORAL_STRIP | 12 refills | Status: AC

## 2024-03-20 MED ORDER — TRUE METRIX AIR GLUCOSE METER DEVI: 0 refills | Status: AC

## 2024-03-20 MED ORDER — TRUEPLUS LANCETS 33G MISC: 12 refills | Status: AC

## 2024-03-20 NOTE — Addendum Note (Signed)
 Addended by: LILIAN SEVERO RAMAN on: 03/20/2024 11:41 AM   Modules accepted: Orders

## 2024-03-22 ENCOUNTER — Emergency Department

## 2024-03-22 ENCOUNTER — Other Ambulatory Visit: Payer: Self-pay

## 2024-03-22 ENCOUNTER — Encounter: Payer: Self-pay | Admitting: Medical Oncology

## 2024-03-22 ENCOUNTER — Emergency Department
Admission: EM | Admit: 2024-03-22 | Discharge: 2024-03-22 | Disposition: A | Discharge status: Home / Self Care | Attending: Emergency Medicine | Admitting: Emergency Medicine

## 2024-03-22 DIAGNOSIS — N189 Chronic kidney disease, unspecified: Secondary | ICD-10-CM | POA: Diagnosis not present

## 2024-03-22 DIAGNOSIS — Z7902 Long term (current) use of antithrombotics/antiplatelets: Secondary | ICD-10-CM | POA: Diagnosis not present

## 2024-03-22 DIAGNOSIS — E875 Hyperkalemia: Secondary | ICD-10-CM | POA: Insufficient documentation

## 2024-03-22 DIAGNOSIS — R531 Weakness: Secondary | ICD-10-CM | POA: Insufficient documentation

## 2024-03-22 DIAGNOSIS — I129 Hypertensive chronic kidney disease with stage 1 through stage 4 chronic kidney disease, or unspecified chronic kidney disease: Secondary | ICD-10-CM | POA: Diagnosis not present

## 2024-03-22 DIAGNOSIS — M069 Rheumatoid arthritis, unspecified: Secondary | ICD-10-CM | POA: Insufficient documentation

## 2024-03-22 DIAGNOSIS — Z79899 Other long term (current) drug therapy: Secondary | ICD-10-CM | POA: Diagnosis present

## 2024-03-22 DIAGNOSIS — E1122 Type 2 diabetes mellitus with diabetic chronic kidney disease: Secondary | ICD-10-CM | POA: Insufficient documentation

## 2024-03-22 DIAGNOSIS — R200 Anesthesia of skin: Secondary | ICD-10-CM | POA: Diagnosis present

## 2024-03-22 DIAGNOSIS — Z7982 Long term (current) use of aspirin: Secondary | ICD-10-CM | POA: Diagnosis not present

## 2024-03-22 DIAGNOSIS — R2689 Other abnormalities of gait and mobility: Secondary | ICD-10-CM | POA: Diagnosis present

## 2024-03-22 DIAGNOSIS — Z8673 Personal history of transient ischemic attack (TIA), and cerebral infarction without residual deficits: Secondary | ICD-10-CM | POA: Insufficient documentation

## 2024-03-22 LAB — COMPREHENSIVE METABOLIC PANEL WITH GFR
ALT: 16 U/L (ref 0–44)
AST: 27 U/L (ref 15–41)
Albumin: 4 g/dL (ref 3.5–5.0)
Alkaline Phosphatase: 95 U/L (ref 38–126)
Anion gap: 11 (ref 5–15)
BUN: 33 mg/dL — ABNORMAL HIGH (ref 8–23)
CO2: 22 mmol/L (ref 22–32)
Calcium: 9.3 mg/dL (ref 8.9–10.3)
Chloride: 103 mmol/L (ref 98–111)
Creatinine, Ser: 1.82 mg/dL — ABNORMAL HIGH (ref 0.44–1.00)
GFR, Estimated: 29 mL/min — ABNORMAL LOW (ref >60)
Glucose, Bld: 203 mg/dL — ABNORMAL HIGH (ref 70–99)
Potassium: 5.6 mmol/L — ABNORMAL HIGH (ref 3.5–5.1)
Sodium: 136 mmol/L (ref 135–145)
Total Bilirubin: 0.7 mg/dL (ref 0.0–1.2)
Total Protein: 8.3 g/dL — ABNORMAL HIGH (ref 6.5–8.1)

## 2024-03-22 LAB — DIFFERENTIAL
Abs Immature Granulocytes: 0.02 K/uL (ref 0.00–0.07)
Basophils Absolute: 0 K/uL (ref 0.0–0.1)
Basophils Relative: 1 %
Eosinophils Absolute: 0.3 K/uL (ref 0.0–0.5)
Eosinophils Relative: 6 %
Immature Granulocytes: 0 %
Lymphocytes Relative: 31 %
Lymphs Abs: 1.8 K/uL (ref 0.7–4.0)
Monocytes Absolute: 0.3 K/uL (ref 0.1–1.0)
Monocytes Relative: 5 %
Neutro Abs: 3.3 K/uL (ref 1.7–7.7)
Neutrophils Relative %: 57 %

## 2024-03-22 LAB — ETHANOL: Alcohol, Ethyl (B): <15 mg/dL (ref <15)

## 2024-03-22 LAB — CBC
HCT: 35.5 % — ABNORMAL LOW (ref 36.0–46.0)
Hemoglobin: 11.5 g/dL — ABNORMAL LOW (ref 12.0–15.0)
MCH: 29.4 pg (ref 26.0–34.0)
MCHC: 32.4 g/dL (ref 30.0–36.0)
MCV: 90.8 fL (ref 80.0–100.0)
Platelets: 238 K/uL (ref 150–400)
RBC: 3.91 MIL/uL (ref 3.87–5.11)
RDW: 12.8 % (ref 11.5–15.5)
WBC: 5.8 K/uL (ref 4.0–10.5)
nRBC: 0 % (ref 0.0–0.2)

## 2024-03-22 LAB — PROTIME-INR
INR: 1 (ref 0.8–1.2)
Prothrombin Time: 14.1 s (ref 11.4–15.2)

## 2024-03-22 LAB — APTT: aPTT: 31 s (ref 24–36)

## 2024-03-22 MED ORDER — SODIUM ZIRCONIUM CYCLOSILICATE 10 G PO PACK
10.0000 g | PACK | Freq: Once | ORAL | Status: AC
  Administered 2024-03-22: 10 g via ORAL
  Filled 2024-03-22: qty 1

## 2024-03-22 NOTE — ED Triage Notes (Signed)
 Pt ambulatory to triage- reports last night around 9pm she began having numbness/tingling and feeling weak all on the right side. Pt has hx of CVA. Pt on plavix  and ASA. Pt speaking without difficulty. VAN neg.

## 2024-03-22 NOTE — ED Notes (Signed)
 Pt transferred to MRI at this time.

## 2024-03-22 NOTE — ED Provider Notes (Signed)
 [facility] Provider Note    Event Date/Time   First MD Initiated Contact with Patient 03/22/24 2047     (approximate)   History   Chief Complaint Weakness and Numbness   HPI  Kelli Gutierrez is a 73 y.o. female with past medical history of hypertension, diabetes, rheumatoid arthritis, CKD, and stroke who presents to the ED complaining of numbness and weakness.  Patient ports that she deals with chronic numbness and mild weakness in the right side of her body from prior stroke that seem to get acutely worse last night around 9 PM.  She describes some heaviness in her right arm and leg, and has had a slightly harder time walking than before.  She additionally describes numbness on the side of her body, but denies any vision or speech changes.  She has otherwise felt well with no fevers, cough, chest pain, shortness of breath, nausea, vomiting, diarrhea, or dysuria.     Physical Exam   Triage Vital Signs: ED Triage Vitals  Encounter Vitals Group     BP 03/22/24 1627 (!) 150/56     Girls Systolic BP Percentile --      Girls Diastolic BP Percentile --      Boys Systolic BP Percentile --      Boys Diastolic BP Percentile --      Pulse Rate 03/22/24 1627 89     Resp 03/22/24 1627 18     Temp 03/22/24 1627 (!) 97.5 F (36.4 C)     Temp Source 03/22/24 1627 Oral     SpO2 03/22/24 1627 99 %     Weight 03/22/24 1628 130 lb 1.1 oz (59 kg)     Height 03/22/24 1628 5' 2 (1.575 m)     Head Circumference --      Peak Flow --      Pain Score 03/22/24 1627 0     Pain Loc --      Pain Education --      Exclude from Growth Chart --     Most recent vital signs: Vitals:   03/22/24 1627 03/22/24 2310  BP: (!) 150/56 129/61  Pulse: 89 84  Resp: 18   Temp: (!) 97.5 F (36.4 C)   SpO2: 99% 97%    Constitutional: Alert and oriented. Eyes: Conjunctivae are normal. Head: Atraumatic. Nose: No congestion/rhinnorhea. Mouth/Throat: Mucous membranes are moist.   Cardiovascular: Normal rate, regular rhythm. Grossly normal heart sounds.  2+ radial pulses bilaterally. Respiratory: Normal respiratory effort.  No retractions. Lungs CTAB. Gastrointestinal: Soft and nontender. No distention. Musculoskeletal: No lower extremity tenderness nor edema.  Neurologic:  Normal speech and language.  4+ out of 5 strength in right upper extremity, 5 out of 5 strength in left upper extremity and bilateral lower extremities.  Slight left-sided facial droop, chronic per patient.    ED Results / Procedures / Treatments   Labs (all labs ordered are listed, but only abnormal results are displayed) Labs Reviewed  CBC - Abnormal; Notable for the following components:      Result Value   Hemoglobin 11.5 (*)    HCT 35.5 (*)    All other components within normal limits  COMPREHENSIVE METABOLIC PANEL WITH GFR - Abnormal; Notable for the following components:   Potassium 5.6 (*)    Glucose, Bld 203 (*)    BUN 33 (*)    Creatinine, Ser 1.82 (*)    Total Protein 8.3 (*)    GFR, Estimated 29 (*)  All other components within normal limits  PROTIME-INR  APTT  DIFFERENTIAL  ETHANOL  URINALYSIS, ROUTINE W REFLEX MICROSCOPIC     EKG  ED ECG REPORT I, Carlin Palin, the attending physician, personally viewed and interpreted this ECG.   Date: 03/22/2024  EKG Time: 16:36  Rate: 90  Rhythm: normal sinus rhythm  Axis: Normal  Intervals:none  ST&T Change: None  RADIOLOGY CT head reviewed and interpreted by me with no hemorrhage or midline shift.  PROCEDURES:  Critical Care performed: No  Procedures   MEDICATIONS ORDERED IN ED: Medications  sodium zirconium cyclosilicate (LOKELMA) packet 10 g (10 g Oral Given 03/22/24 2310)     IMPRESSION / MDM / ASSESSMENT AND PLAN / ED COURSE  I reviewed the triage vital signs and the nursing notes.                              73 y.o. female with past medical history of hypertension, diabetes, rheumatoid  arthritis, CKD, and stroke who presents to the ED complaining of acute on chronic numbness and weakness on the right side of her body since last night.  Patient's presentation is most consistent with acute presentation with potential threat to life or bodily function.  Differential diagnosis includes, but is not limited to, stroke, TIA, complicated migraine, UTI, anemia, electrolyte abnormality, AKI, recrudescence of prior stroke.  Patient well-appearing and in no acute distress, vital signs are unremarkable.  She is alert and oriented, has some slight weakness in her right arm compared to the left but strength intact to her right lower extremity.  No features concerning for LVO and patient outside the window for acute intervention.  CT head is negative for acute process, labs with stable CKD and no significant anemia or leukocytosis.  She does have some mild hyperkalemia that we will treat with Lokelma.  Plan to check MRI to assess for acute stroke.  MRI is negative for acute stroke, shows only old stroke.  Patient denies any urinary symptoms to suggest UTI.  With reassuring workup, patient appropriate for discharge home with outpatient follow-up, was counseled to return to the ED for new or worsening symptoms, patient agrees with plan.      FINAL CLINICAL IMPRESSION(S) / ED DIAGNOSES   Final diagnoses:  Right sided weakness  Numbness     Rx / DC Orders   ED Discharge Orders     None        Note:  This document was prepared using Dragon voice recognition software and may include unintentional dictation errors.   Palin Carlin, MD 03/22/24 [phone]

## 2024-03-22 NOTE — ED Notes (Signed)
 Pt verbalizes understanding of discharge instructions. Opportunity for questioning and answers were provided. Pt discharged from ED. Pt waiting in lobby for daughter to arrive. First nurse aware.

## 2024-04-10 ENCOUNTER — Ambulatory Visit
Admission: RE | Admit: 2024-04-10 | Discharge: 2024-04-10 | Disposition: A | Discharge status: Home / Self Care | Source: Ambulatory Visit | Attending: Family Medicine | Admitting: Family Medicine

## 2024-04-10 ENCOUNTER — Ambulatory Visit
Admission: RE | Admit: 2024-04-10 | Discharge: 2024-04-10 | Disposition: A | Discharge status: Home / Self Care | Attending: Family Medicine | Admitting: Family Medicine

## 2024-04-10 ENCOUNTER — Ambulatory Visit (INDEPENDENT_AMBULATORY_CARE_PROVIDER_SITE_OTHER): Admitting: Family Medicine

## 2024-04-10 ENCOUNTER — Encounter: Payer: Self-pay | Admitting: Family Medicine

## 2024-04-10 VITALS — BP 118/57 | HR 79 | Resp 14 | Wt 134.8 lb

## 2024-04-10 DIAGNOSIS — E1165 Type 2 diabetes mellitus with hyperglycemia: Secondary | ICD-10-CM

## 2024-04-10 DIAGNOSIS — N184 Chronic kidney disease, stage 4 (severe): Secondary | ICD-10-CM | POA: Diagnosis not present

## 2024-04-10 DIAGNOSIS — I82611 Acute embolism and thrombosis of superficial veins of right upper extremity: Secondary | ICD-10-CM

## 2024-04-10 DIAGNOSIS — R053 Chronic cough: Secondary | ICD-10-CM

## 2024-04-10 DIAGNOSIS — E875 Hyperkalemia: Secondary | ICD-10-CM

## 2024-04-10 DIAGNOSIS — E1159 Type 2 diabetes mellitus with other circulatory complications: Secondary | ICD-10-CM | POA: Diagnosis not present

## 2024-04-10 DIAGNOSIS — I152 Hypertension secondary to endocrine disorders: Secondary | ICD-10-CM | POA: Diagnosis not present

## 2024-04-10 DIAGNOSIS — E1122 Type 2 diabetes mellitus with diabetic chronic kidney disease: Secondary | ICD-10-CM

## 2024-04-10 DIAGNOSIS — R0982 Postnasal drip: Secondary | ICD-10-CM

## 2024-04-10 NOTE — Progress Notes (Signed)
 "     Established patient visit   Patient: Kelli Gutierrez   DOB: August 05, 1950   73 y.o. Female  MRN: [id number] Visit Date: 04/10/2024  Today's healthcare provider: Jon Eva, MD   Chief Complaint  Patient presents with   Medical Management of Chronic Issues    4 week f/u   Subjective    HPI HPI     Medical Management of Chronic Issues    Additional comments: 4 week f/u      Last edited by Wilfred Hargis RAMAN, CMA on 04/10/2024  9:42 AM.       Discussed the use of AI scribe software for clinical note transcription with the patient, who gave verbal consent to proceed.  History of Present Illness   Kelli Gutierrez is a 73 year old female who presents with persistent dry cough and concerns about a new venous growth.  She describes a dry, frequent, uncontrollable cough for several months. It sometimes produces phlegm and causes throat irritation, with no shortness of breath or reflux symptoms. Water does not help. She stopped lisinopril  without improvement. She uses Flonase  intermittently rather than daily and denies nasal drainage.  She noticed a new growth in a vein on her hand about four months ago. It causes occasional mild pain and resembles a prior venous growth that ultimately resolved after surgery. She is worried but would prefer to avoid surgery if possible.  She recently went to the emergency room for tingling symptoms. Stroke was ruled out, and she was diagnosed with hyperkalemia and treated with Lokelma . She reports fluctuating kidney function. She is currently taking Jardiance  and has stopped lisinopril .       Medications: Show/hide medication list[1]  Review of Systems     Objective    BP (!) 118/57   Pulse 79   Resp 14   Wt 134 lb 12.8 oz (61.1 kg)   SpO2 98%   BMI 24.66 kg/m    Physical Exam Vitals reviewed.  Constitutional:      General: She is not in acute distress.    Appearance: Normal appearance. She is well-developed. She is not  diaphoretic.  HENT:     Head: Normocephalic and atraumatic.  Eyes:     General: No scleral icterus.    Conjunctiva/sclera: Conjunctivae normal.  Neck:     Thyroid: No thyromegaly.  Cardiovascular:     Rate and Rhythm: Normal rate and regular rhythm.     Heart sounds: Normal heart sounds. No murmur heard. Pulmonary:     Effort: Pulmonary effort is normal. No respiratory distress.     Breath sounds: Normal breath sounds. No wheezing, rhonchi or rales.  Musculoskeletal:     Cervical back: Neck supple.     Right lower leg: No edema.     Left lower leg: No edema.  Lymphadenopathy:     Cervical: No cervical adenopathy.  Skin:    General: Skin is warm and dry.     Findings: No rash.  Neurological:     Mental Status: She is alert and oriented to person, place, and time. Mental status is at baseline.  Psychiatric:        Mood and Affect: Mood normal.        Behavior: Behavior normal.      No results found for any visits on 04/10/24.  Assessment & Plan     Problem List Items Addressed This Visit       Cardiovascular and Mediastinum   Hypertension associated  with diabetes (HCC)   Well controlled Stopped lisinopril  and less hypotension      Relevant Orders   AMB Referral [facility] Care Management     Endocrine   Type 2 diabetes mellitus with hyperglycemia, without long-term current use of insulin  (HCC)   Type 2 diabetes mellitus. Current management appears stable with no immediate changes required. - Continue current diabetes management plan       Relevant Orders   AMB Referral [facility] Care Management     Genitourinary   CKD (chronic kidney disease) stage 4, GFR 15-29 ml/min (HCC)   Fluctuating GFR levels. Recent improvement in kidney function with GFR returning to 1.8 range. No immediate changes to diabetes medications required. - Continue current diabetes medications - Referred to nephrology for further management      Relevant Orders   AMB Referral [facility] Care Management    Basic Metabolic Panel (BMET)   Other Visit Diagnoses       Superficial venous thrombosis of right upper extremity    -  Primary   Relevant Orders   VAS US  UPPER EXTREMITY VENOUS DUPLEX     Hyperkalemia       Relevant Orders   AMB Referral [facility] Care Management   Basic Metabolic Panel (BMET)     Chronic cough       Relevant Orders   DG Chest 2 View     Postnasal drip               Superficial venous thrombosis of right upper extremity Superficial venous thrombosis in the right upper extremity, likely in a superficial vein, present for approximately four months. No significant pain, but occasional discomfort. No signs of deep vein thrombosis (DVT). - Ordered ultrasound of the right upper extremity to assess for deep vein involvement - Reassured that superficial venous thrombosis is not concerning unless it involves deep veins  Chronic cough due to postnasal drip Chronic dry cough persisting for months, likely due to postnasal drip. No wheezing or signs of asthma. Lisinopril  was discontinued without improvement, ruling out medication-induced cough. No signs of acid reflux or postnasal drainage from the nose, but throat irritation noted. - Prescribed Flonase  nasal spray, two sprays in each nostril once daily - Ordered chest x-ray to rule out any underlying lung pathology  Hyperkalemia Recent episode of hyperkalemia requiring hospitalization. Potassium levels were elevated, but kidney function has improved. Lokelma  was administered in the hospital to manage potassium levels. - Rechecked potassium levels today - Continue follow-up with nephrology      Return in about 6 weeks (around 05/22/2024) for chronic disease f/u.       Jon Eva, MD  [facility] [phone] (phone) [phone] (fax)  [facility]      [1]  Outpatient Medications Prior to Visit  Medication Sig   amLODipine  (NORVASC ) 5 MG tablet Take 1 tablet (5 mg  total) by mouth daily.   aspirin  81 MG chewable tablet Chew 81 mg by mouth daily.   atorvastatin  (LIPITOR) 20 MG tablet Take 1 tablet (20 mg total) by mouth daily.   Blood Glucose Monitoring Suppl (TRUE METRIX AIR GLUCOSE METER) DEVI Use to monitor blood sugar daily   clopidogrel  (PLAVIX ) 75 MG tablet Take 1 tablet by mouth once daily   empagliflozin  (JARDIANCE ) 25 MG TABS tablet TAKE 1 TABLET BY MOUTH ONCE DAILY BEFORE BREAKFAST   fluticasone  (FLONASE ) 50 MCG/ACT nasal spray Place 2 sprays into both nostrils daily.   glipiZIDE  (GLUCOTROL )  5 MG tablet TAKE 1 TABLET BY MOUTH ONCE DAILY BEFORE BREAKFAST   glucose blood (TRUE METRIX BLOOD GLUCOSE TEST) test strip Use as instructed   hydroxychloroquine  (PLAQUENIL ) 200 MG tablet Take 1 tablet (200 mg total) by mouth daily.   metFORMIN  (GLUCOPHAGE -XR) 500 MG 24 hr tablet Take 1 tablet (500 mg total) by mouth daily with breakfast.   metoprolol  succinate (TOPROL -XL) 25 MG 24 hr tablet Take 1 tablet (25 mg total) by mouth daily.   Semaglutide  (RYBELSUS ) 14 MG TABS Take 1 tablet by mouth once daily   sevelamer  carbonate (RENVELA ) 800 MG tablet Take 1 tablet (800 mg total) by mouth 2 (two) times daily. (Patient not taking: Reported on 04/10/2024)   TRUEplus Lancets 33G MISC Use to monitor blood sugar daily   [DISCONTINUED] metFORMIN  (GLUCOPHAGE -XR) 500 MG 24 hr tablet Take 500 mg by mouth 2 (two) times daily.    [DISCONTINUED] metFORMIN  (GLUCOPHAGE -XR) 500 MG 24 hr tablet Take 500 mg by mouth 2 (two) times daily.   [DISCONTINUED] pregabalin  (LYRICA ) 25 MG capsule Take 1 capsule (25 mg total) by mouth 2 (two) times daily.   No facility-administered medications prior to visit.   "

## 2024-04-10 NOTE — Assessment & Plan Note (Signed)
 Fluctuating GFR levels. Recent improvement in kidney function with GFR returning to 1.8 range. No immediate changes to diabetes medications required. - Continue current diabetes medications - Referred to nephrology for further management

## 2024-04-10 NOTE — Assessment & Plan Note (Signed)
 Type 2 diabetes mellitus. Current management appears stable with no immediate changes required. - Continue current diabetes management plan

## 2024-04-10 NOTE — Assessment & Plan Note (Signed)
 Well controlled Stopped lisinopril  and less hypotension

## 2024-04-11 LAB — BASIC METABOLIC PANEL WITH GFR
BUN/Creatinine Ratio: 22 (ref 12–28)
BUN: 46 mg/dL — ABNORMAL HIGH (ref 8–27)
CO2: 19 mmol/L — ABNORMAL LOW (ref 20–29)
Calcium: 9.8 mg/dL (ref 8.7–10.3)
Chloride: 106 mmol/L (ref 96–106)
Creatinine, Ser: 2.12 mg/dL — ABNORMAL HIGH (ref 0.57–1.00)
Glucose: 153 mg/dL — ABNORMAL HIGH (ref 70–99)
Potassium: 4.8 mmol/L (ref 3.5–5.2)
Sodium: 137 mmol/L (ref 134–144)
eGFR: 24 mL/min/1.73 — ABNORMAL LOW

## 2024-04-16 ENCOUNTER — Ambulatory Visit: Payer: Self-pay

## 2024-04-16 NOTE — Telephone Encounter (Signed)
 FYI Only or Action Required?: FYI only for provider: advised UC.  Patient was last seen in primary care on 04/10/2024 by Myrla Jon HERO, MD.  Called Nurse Triage reporting Otalgia.  Symptoms began several days ago.  Interventions attempted: OTC medications:  SABRA  Symptoms are: gradually worsening.  Triage Disposition: See HCP Within 4 Hours (Or PCP Triage)  Patient/caregiver understands and will follow disposition?: Answer Assessment - Initial Assessment Questions Pt states had a sore throat was seen last week. Now having ear pain that does also go into her jaw on the right side. Hurts to open mouth wide. Thinks she needs atbx. Due to schedule availability, RN recommended UC since she is having the ear pain. Pt agreed and stated she'd go there.     1. LOCATION: Which ear is involved?     Right ear 2. ONSET: When did the ear pain start?      Got worse over the weekend 3. SEVERITY: How bad is the pain?  (Scale 1-10; mild, moderate or severe)     Mod to severe 4. URI SYMPTOMS: Do you have a runny nose or cough?     Cough and sore throat 5. CAUSE: Have you been swimming recently?, How often do you use Q-TIPS?, Have you had any recent air travel or scuba diving?     Thinks ear infection 6. OTHER SYMPTOMS: Do you have any other symptoms? (e.g., decreased hearing, dizziness, headache, stiff neck, vomiting)     no  Protocols used: Earache-A-AH

## 2024-04-16 NOTE — Telephone Encounter (Deleted)
 12/30- coughing badly- throat hurt, now year hurts- Yellow and dried blood    Copied from CRM #[id number]. Topic: Clinical - Red Word Triage >> Apr 16, 2024 10:13 AM Thersia BROCKS wrote: Red Word that prompted transfer to Nurse Triage: Patient called in stated she is in a lot of pain can barely open mouth, ear hurts can't chew food , right ear Reason for Disposition  [1] SEVERE pain (e.g., excruciating) and [2] not improved 2 hours after pain medicine (e.g., acetaminophen  or ibuprofen)  Protocols used: Rilla

## 2024-04-16 NOTE — Telephone Encounter (Signed)
 Noted

## 2024-04-17 ENCOUNTER — Telehealth: Payer: Self-pay

## 2024-04-17 ENCOUNTER — Ambulatory Visit: Payer: Self-pay | Admitting: Family Medicine

## 2024-04-17 NOTE — Progress Notes (Unsigned)
 Care Guide Pharmacy Note  04/17/2024 Name: NATISHA TRZCINSKI MRN: [id number] DOB: 03/12/1951  Referred By: Myrla Jon HERO, MD Reason for referral: Complex Care Management and Call Attempt #1 (Unsuccessful initial outreach to schedule with PHARM D- Allyson)   Shacarra G Guadalupe is a 74 y.o. year old female who is a primary care patient of Bacigalupo, Jon HERO, MD.  Aldea G Tilly was referred to the pharmacist for assistance related to: HTN  An unsuccessful telephone outreach was attempted today to contact the patient who was referred to the pharmacy team for assistance with Disease management. Additional attempts will be made to contact the patient.  Leotis Rase [facility], [facility] Guide  Direct Dial: [phone]  Fax [phone]

## 2024-04-18 NOTE — Progress Notes (Unsigned)
 Care Guide Pharmacy Note  04/18/2024 Name: Kelli Gutierrez MRN: [id number] DOB: February 11, 1951  Referred By: Myrla Jon HERO, MD Reason for referral: Complex Care Management, Call Attempt #1 (Unsuccessful initial outreach to schedule with PHARM D- Allyson), and Call Attempt #2 (Unsuccessful initial outreach to schedule with PHARM D- Allyson)   Kelli Gutierrez is a 74 y.o. year old female who is a primary care patient of Bacigalupo, Jon HERO, MD.  Kelli Gutierrez was referred to the pharmacist for assistance related to: HTN  A second unsuccessful telephone outreach was attempted today to contact the patient who was referred to the pharmacy team for assistance with Disease management. Additional attempts will be made to contact the patient.  Leotis Rase [facility], [facility] Guide  Direct Dial: [phone]  Fax [phone]

## 2024-04-25 ENCOUNTER — Other Ambulatory Visit: Payer: Self-pay | Admitting: Family Medicine

## 2024-04-27 NOTE — Progress Notes (Signed)
 Mailed letter out advised results.

## 2024-05-04 ENCOUNTER — Ambulatory Visit (INDEPENDENT_AMBULATORY_CARE_PROVIDER_SITE_OTHER)

## 2024-05-04 DIAGNOSIS — I82611 Acute embolism and thrombosis of superficial veins of right upper extremity: Secondary | ICD-10-CM | POA: Diagnosis not present

## 2024-05-10 ENCOUNTER — Other Ambulatory Visit: Payer: Self-pay | Admitting: Family Medicine

## 2024-05-10 DIAGNOSIS — Z8673 Personal history of transient ischemic attack (TIA), and cerebral infarction without residual deficits: Secondary | ICD-10-CM

## 2024-05-11 NOTE — Progress Notes (Signed)
 Will mail letter, have tried emergency contact and pt number no response on either.

## 2024-05-28 ENCOUNTER — Ambulatory Visit: Admitting: Family Medicine
